# Patient Record
Sex: Male | Born: 1935 | Race: White | Hispanic: No | Smoking: Former smoker
Health system: Southern US, Community
[De-identification: ages and names within clinical notes are randomized; demographics above are authoritative.]

## PROBLEM LIST (undated history)

## (undated) DIAGNOSIS — R972 Elevated prostate specific antigen [PSA]: Secondary | ICD-10-CM

## (undated) DIAGNOSIS — J449 Chronic obstructive pulmonary disease, unspecified: Secondary | ICD-10-CM

## (undated) DIAGNOSIS — I10 Essential (primary) hypertension: Secondary | ICD-10-CM

## (undated) DIAGNOSIS — N400 Enlarged prostate without lower urinary tract symptoms: Secondary | ICD-10-CM

## (undated) HISTORY — DX: Benign prostatic hyperplasia without lower urinary tract symptoms: N40.0

## (undated) HISTORY — DX: Elevated prostate specific antigen (PSA): R97.20

## (undated) HISTORY — DX: Chronic obstructive pulmonary disease, unspecified: J44.9

## (undated) HISTORY — DX: Essential (primary) hypertension: I10

---

## 2005-11-17 ENCOUNTER — Ambulatory Visit: Payer: Self-pay | Admitting: Infectious Diseases

## 2007-04-24 ENCOUNTER — Ambulatory Visit: Payer: Self-pay | Admitting: Gastroenterology

## 2008-02-01 ENCOUNTER — Ambulatory Visit: Payer: Self-pay | Admitting: Internal Medicine

## 2008-02-01 ENCOUNTER — Other Ambulatory Visit: Payer: Self-pay

## 2008-02-06 ENCOUNTER — Ambulatory Visit: Payer: Self-pay | Admitting: Internal Medicine

## 2008-04-22 ENCOUNTER — Ambulatory Visit: Payer: Self-pay | Admitting: Specialist

## 2008-06-04 ENCOUNTER — Ambulatory Visit: Payer: Self-pay | Admitting: Internal Medicine

## 2008-07-10 ENCOUNTER — Ambulatory Visit: Payer: Self-pay | Admitting: Cardiology

## 2011-10-05 ENCOUNTER — Other Ambulatory Visit: Payer: Self-pay | Admitting: Internal Medicine

## 2011-11-12 ENCOUNTER — Ambulatory Visit (INDEPENDENT_AMBULATORY_CARE_PROVIDER_SITE_OTHER): Payer: Medicare Other | Admitting: Internal Medicine

## 2011-11-12 ENCOUNTER — Encounter: Payer: Self-pay | Admitting: Internal Medicine

## 2011-11-12 VITALS — BP 112/72 | HR 70 | Temp 98.7°F | Ht 71.0 in | Wt 178.0 lb

## 2011-11-12 DIAGNOSIS — M810 Age-related osteoporosis without current pathological fracture: Secondary | ICD-10-CM

## 2011-11-12 DIAGNOSIS — R972 Elevated prostate specific antigen [PSA]: Secondary | ICD-10-CM

## 2011-11-12 DIAGNOSIS — J439 Emphysema, unspecified: Secondary | ICD-10-CM

## 2011-11-12 DIAGNOSIS — R5383 Other fatigue: Secondary | ICD-10-CM

## 2011-11-12 DIAGNOSIS — Z86010 Personal history of colonic polyps: Secondary | ICD-10-CM

## 2011-11-12 DIAGNOSIS — R5381 Other malaise: Secondary | ICD-10-CM

## 2011-11-12 DIAGNOSIS — Z125 Encounter for screening for malignant neoplasm of prostate: Secondary | ICD-10-CM

## 2011-11-12 DIAGNOSIS — Z0000 Encounter for general adult medical examination without abnormal findings: Secondary | ICD-10-CM

## 2011-11-12 LAB — CBC WITH DIFFERENTIAL/PLATELET
Basophils Absolute: 0 10*3/uL (ref 0.0–0.1)
Eosinophils Absolute: 0.2 10*3/uL (ref 0.0–0.7)
HCT: 41.6 % (ref 39.0–52.0)
Lymphs Abs: 1.4 10*3/uL (ref 0.7–4.0)
MCV: 97.2 fl (ref 78.0–100.0)
Monocytes Absolute: 0.6 10*3/uL (ref 0.1–1.0)
Neutrophils Relative %: 61.6 % (ref 43.0–77.0)
Platelets: 172 10*3/uL (ref 150.0–400.0)
RDW: 14 % (ref 11.5–14.6)
WBC: 6 10*3/uL (ref 4.5–10.5)

## 2011-11-12 LAB — PSA: PSA: 17.13 ng/mL — ABNORMAL HIGH (ref 0.10–4.00)

## 2011-11-12 NOTE — Progress Notes (Signed)
Patient ID: Eric Hicks, male   DOB: 11-11-1926, 78 y.o.   MRN: [id number] The patient is here for annual Medicare wellness examination and management of other chronic and acute problems. He had a recentt episode of sinusitis treated in March by Urgent Care  With steroid shot and a Z pack .  Sympotms resolved.  No new issues,  Worried abou this wife Eric Hicks due to recent changes in memory.      The risk factors are reflected in the social history.  The roster of all physicians providing medical care to patient - is listed in the Snapshot section of the chart.  Activities of daily living:  The patient is 100% independent in all ADLs: dressing, toileting, feeding as well as independent mobility  Home safety : The patient has smoke detectors in the home. They wear seatbelts.  There are no firearms at home. There is no violence in the home.   There is no risks for hepatitis, STDs or HIV. There is no   history of blood transfusion. They have no travel history to infectious disease endemic areas of the world.  The patient has seen their dentist in the last six month. They have seen their eye doctor in the last year. They admit to slight hearing difficulty with regard to whispered voices and some television programs.  They have deferred audiologic testing in the last year.  They do not  have excessive sun exposure. Discussed the need for sun protection: hats, long sleeves and use of sunscreen if there is significant sun exposure.   Diet: the importance of a healthy diet is discussed. They do have a healthy diet.  The benefits of regular aerobic exercise were discussed. She walks 4 times per week ,  20 minutes.   Depression screen: there are no signs or vegative symptoms of depression- irritability, change in appetite, anhedonia, sadness/tearfullness.  Cognitive assessment: the patient manages all their financial and personal affairs and is actively engaged. They could relate day,date,year and events; recalled  2/3 objects at 3 minutes; performed clock-face test normally.  The following portions of the patient's history were reviewed and updated as appropriate: allergies, current medications, past family history, past medical history,  past surgical history, past social history  and problem list.  Visual acuity was not assessed per patient preference since she has regular follow up with her ophthalmologist. Hearing and body mass index were assessed and reviewed.   During the course of the visit the patient was educated and counseled about appropriate screening and preventive services including : fall prevention , diabetes screening, nutrition counseling, colorectal cancer screening, and recommended immunizations.    General appearance: alert, cooperative and appears stated age Head: Normocephalic, without obvious abnormality, atraumatic Eyes: conjunctivae/corneas clear. PERRL, EOM's intact. Fundi benign. Ears: normal TM's and external ear canals both ears Nose: Nares normal. Septum midline. Mucosa normal. No drainage or sinus tenderness. Throat: lips, mucosa, and tongue normal; teeth and gums normal Neck: no adenopathy, no carotid bruit, no JVD, supple, symmetrical, trachea midline and thyroid not enlarged, symmetric, no tenderness/mass/nodules Lungs: clear to auscultation bilaterally Heart: regular rate and rhythm, S1, S2 normal, no murmur, click, rub or gallop Abdomen: soft, non-tender; bowel sounds normal; no masses,  no organomegaly Extremities: extremities normal, atraumatic, no cyanosis or edema Pulses: 2+ and symmetric Skin: Skin color, texture, turgor normal. No rashes or lesions Neurologic: Alert and oriented X 3, normal strength and tone. Normal symmetric reflexes. Normal coordination and gait.   Assessment and Plan  History of adenomatous polyp of colon By serial colonoscopy in 2004, 2008 and 2012. Repeat ue in 2015, iftikhar.   COPD (chronic obstructive pulmonary disease) with  emphysema By PFTs in 2009, secondary to tobacco abuse.  currenlly asymptomatic on maintenance inhalers  Elevated PSA, between 10 and less than 20 ng/ml He continues to receive annual PSAs despite his age due BPH and management by Dr. Garner Nash.  Will send today's elevated PSA to him,  Previous level unknown as patient ha snot been seen in 1 year and records were not available at time of evaluation    Updated Medication List Outpatient Encounter Prescriptions as of 11/12/2011  Medication Sig Dispense Refill  . ADVAIR DISKUS 250-50 MCG/DOSE AEPB INHALE 1 PUFF TWICE A DAY  60 each  5  . alendronate (FOSAMAX) 70 MG tablet Take 70 mg by mouth every 7 (seven) days. Take with a full glass of water on an empty stomach.      . AMBULATORY NON FORMULARY MEDICATION Prostate Formula  With Saw Palmetto 2 tablets daily      . AMBULATORY NON FORMULARY MEDICATION Joint Advantage Gold 2 tablets daily      . calcium carbonate (OS-CAL) 600 MG TABS Take 600 mg by mouth 2 (two) times daily.      Marland Kitchen doxazosin (CARDURA) 4 MG tablet TAKE 1 TABLET BY MOUTH AT BEDTIME  90 tablet  3  . Ergocalciferol (VITAMIN D2) 400 UNITS TABS Take 1 tablet by mouth daily.      . hydrochlorothiazide (HYDRODIURIL) 25 MG tablet TAKE 1 TABLET BY MOUTH EVERY MORNING  90 tablet  3  . meclizine (ANTIVERT) 25 MG tablet Take 25 mg by mouth as needed.

## 2011-11-12 NOTE — Patient Instructions (Addendum)
Try benadryl,  Allegra, zyrtec or claritin to see if they will dry up your inner ear.  (once daily)  Also consider using Simply Saline to flush sinuses out once or twice daily

## 2011-11-13 LAB — COMPLETE METABOLIC PANEL WITH GFR
ALT: 14 U/L (ref 0–53)
AST: 21 U/L (ref 0–37)
Albumin: 3.9 g/dL (ref 3.5–5.2)
BUN: 24 mg/dL — ABNORMAL HIGH (ref 6–23)
CO2: 29 mEq/L (ref 19–32)
Calcium: 9.4 mg/dL (ref 8.4–10.5)
Chloride: 107 mEq/L (ref 96–112)
Creat: 1.77 mg/dL — ABNORMAL HIGH (ref 0.50–1.35)
GFR, Est African American: 40 mL/min — ABNORMAL LOW
Potassium: 4.4 mEq/L (ref 3.5–5.3)

## 2011-11-14 ENCOUNTER — Encounter: Payer: Self-pay | Admitting: Internal Medicine

## 2011-11-14 DIAGNOSIS — Z860101 Personal history of adenomatous and serrated colon polyps: Secondary | ICD-10-CM | POA: Insufficient documentation

## 2011-11-14 DIAGNOSIS — Z86010 Personal history of colonic polyps: Secondary | ICD-10-CM | POA: Insufficient documentation

## 2011-11-14 DIAGNOSIS — J439 Emphysema, unspecified: Secondary | ICD-10-CM | POA: Insufficient documentation

## 2011-11-14 DIAGNOSIS — R972 Elevated prostate specific antigen [PSA]: Secondary | ICD-10-CM | POA: Insufficient documentation

## 2011-11-14 DIAGNOSIS — N400 Enlarged prostate without lower urinary tract symptoms: Secondary | ICD-10-CM | POA: Insufficient documentation

## 2011-11-14 NOTE — Assessment & Plan Note (Signed)
By serial colonoscopy in 2004, 2008 and 2012. Repeat ue in 2015, iftikhar.

## 2011-11-14 NOTE — Assessment & Plan Note (Signed)
By PFTs in 2009, secondary to tobacco abuse.  currenlly asymptomatic on maintenance inhalers

## 2011-11-14 NOTE — Assessment & Plan Note (Signed)
He continues to receive annual PSAs despite his age due BPH and management by Dr. Garner Nash.  Will send today's elevated PSA to him,  Previous level unknown as patient ha snot been seen in 1 year and records were not available at time of evaluation

## 2011-11-22 ENCOUNTER — Encounter: Payer: Self-pay | Admitting: Internal Medicine

## 2011-12-10 ENCOUNTER — Other Ambulatory Visit (INDEPENDENT_AMBULATORY_CARE_PROVIDER_SITE_OTHER): Payer: Medicare Other | Admitting: *Deleted

## 2011-12-10 DIAGNOSIS — I10 Essential (primary) hypertension: Secondary | ICD-10-CM

## 2011-12-10 LAB — BASIC METABOLIC PANEL
Calcium: 9.5 mg/dL (ref 8.4–10.5)
Creatinine, Ser: 1.4 mg/dL (ref 0.4–1.5)
GFR: 50.16 mL/min — ABNORMAL LOW (ref >60.00)
Sodium: 142 mEq/L (ref 135–145)

## 2011-12-22 ENCOUNTER — Ambulatory Visit: Payer: Self-pay | Admitting: Internal Medicine

## 2011-12-22 LAB — HM DEXA SCAN: HM Dexa Scan: -2.7

## 2012-01-03 ENCOUNTER — Encounter: Payer: Self-pay | Admitting: Internal Medicine

## 2012-01-03 ENCOUNTER — Telehealth: Payer: Self-pay | Admitting: Internal Medicine

## 2012-01-03 DIAGNOSIS — M810 Age-related osteoporosis without current pathological fracture: Secondary | ICD-10-CM | POA: Insufficient documentation

## 2012-01-03 NOTE — Telephone Encounter (Signed)
His osteoporosois has improved since 2009 with alendronate.  continue medication for now.

## 2012-01-03 NOTE — Assessment & Plan Note (Signed)
Managed with alendronate and improvement seen by 2013 DEXA scan. With DEXA Aug 2009 T scores -2.7  Both hips imporving to -2.0.

## 2012-01-04 NOTE — Telephone Encounter (Signed)
Left detailed message notifying patient.

## 2012-02-24 ENCOUNTER — Other Ambulatory Visit: Payer: Self-pay | Admitting: Internal Medicine

## 2012-02-24 DIAGNOSIS — R972 Elevated prostate specific antigen [PSA]: Secondary | ICD-10-CM

## 2012-04-05 ENCOUNTER — Other Ambulatory Visit: Payer: Self-pay | Admitting: Internal Medicine

## 2012-04-05 ENCOUNTER — Other Ambulatory Visit: Payer: Self-pay

## 2012-04-05 MED ORDER — FLUTICASONE-SALMETEROL 250-50 MCG/DOSE IN AEPB: 1.0000 | INHALATION_SPRAY | Freq: Two times a day (BID) | RESPIRATORY_TRACT | Status: DC

## 2012-04-05 NOTE — Telephone Encounter (Signed)
Refill request for Advair 250-50 mc sent electronic to [facility].

## 2012-06-10 ENCOUNTER — Other Ambulatory Visit: Payer: Self-pay

## 2012-10-02 ENCOUNTER — Other Ambulatory Visit: Payer: Self-pay | Admitting: *Deleted

## 2012-10-02 MED ORDER — FLUTICASONE-SALMETEROL 250-50 MCG/DOSE IN AEPB: 1.0000 | INHALATION_SPRAY | Freq: Two times a day (BID) | RESPIRATORY_TRACT | Status: DC

## 2012-10-05 ENCOUNTER — Other Ambulatory Visit: Payer: Self-pay | Admitting: Internal Medicine

## 2012-10-05 DIAGNOSIS — Z79899 Other long term (current) drug therapy: Secondary | ICD-10-CM

## 2012-10-05 DIAGNOSIS — E785 Hyperlipidemia, unspecified: Secondary | ICD-10-CM

## 2012-10-05 NOTE — Telephone Encounter (Signed)
Patient has not been seen since July and should be seen every 6 months for management of hypertension, etc.  His labs are overdue.  Ok to refill for 30 days but he needs to come by for fasting labs during that period and to schedule an annual exam. 30 minutes medicare wellness

## 2012-10-13 NOTE — Telephone Encounter (Signed)
Left message for patient to return call.

## 2012-10-16 NOTE — Telephone Encounter (Signed)
Patient scheduled for appointment 10/11/12 will come fasting for labs also that day. FYI

## 2012-11-10 ENCOUNTER — Ambulatory Visit (INDEPENDENT_AMBULATORY_CARE_PROVIDER_SITE_OTHER): Payer: Medicare Other | Admitting: Internal Medicine

## 2012-11-10 ENCOUNTER — Encounter: Payer: Self-pay | Admitting: Internal Medicine

## 2012-11-10 VITALS — BP 146/78 | HR 73 | Temp 97.7°F | Resp 14 | Ht 70.0 in | Wt 177.8 lb

## 2012-11-10 DIAGNOSIS — Z1211 Encounter for screening for malignant neoplasm of colon: Secondary | ICD-10-CM

## 2012-11-10 DIAGNOSIS — N400 Enlarged prostate without lower urinary tract symptoms: Secondary | ICD-10-CM

## 2012-11-10 DIAGNOSIS — R972 Elevated prostate specific antigen [PSA]: Secondary | ICD-10-CM

## 2012-11-10 DIAGNOSIS — J438 Other emphysema: Secondary | ICD-10-CM

## 2012-11-10 DIAGNOSIS — Z79899 Other long term (current) drug therapy: Secondary | ICD-10-CM

## 2012-11-10 DIAGNOSIS — L270 Generalized skin eruption due to drugs and medicaments taken internally: Secondary | ICD-10-CM | POA: Insufficient documentation

## 2012-11-10 DIAGNOSIS — I10 Essential (primary) hypertension: Secondary | ICD-10-CM

## 2012-11-10 DIAGNOSIS — E785 Hyperlipidemia, unspecified: Secondary | ICD-10-CM

## 2012-11-10 DIAGNOSIS — M810 Age-related osteoporosis without current pathological fracture: Secondary | ICD-10-CM

## 2012-11-10 DIAGNOSIS — Z860101 Personal history of adenomatous and serrated colon polyps: Secondary | ICD-10-CM

## 2012-11-10 DIAGNOSIS — N182 Chronic kidney disease, stage 2 (mild): Secondary | ICD-10-CM

## 2012-11-10 DIAGNOSIS — Z0000 Encounter for general adult medical examination without abnormal findings: Secondary | ICD-10-CM

## 2012-11-10 DIAGNOSIS — J439 Emphysema, unspecified: Secondary | ICD-10-CM

## 2012-11-10 DIAGNOSIS — Z86010 Personal history of colonic polyps: Secondary | ICD-10-CM

## 2012-11-10 LAB — COMPREHENSIVE METABOLIC PANEL
ALT: 14 U/L (ref 0–53)
BUN: 28 mg/dL — ABNORMAL HIGH (ref 6–23)
CO2: 29 mEq/L (ref 19–32)
Calcium: 9.5 mg/dL (ref 8.4–10.5)
Chloride: 102 mEq/L (ref 96–112)
Creatinine, Ser: 1.7 mg/dL — ABNORMAL HIGH (ref 0.4–1.5)
GFR: 42.43 mL/min — ABNORMAL LOW (ref >60.00)
Glucose, Bld: 89 mg/dL (ref 70–99)

## 2012-11-10 LAB — LIPID PANEL
Cholesterol: 94 mg/dL (ref 0–200)
HDL: 49.5 mg/dL (ref >39.00)

## 2012-11-10 LAB — PSA, MEDICARE: PSA: 17.6 ng/ml — ABNORMAL HIGH (ref 0.10–4.00)

## 2012-11-10 NOTE — Progress Notes (Signed)
Patient ID: Eric Hicks, male   DOB: 08-Dec-1926, 79 y.o.   MRN: [id number]  The patient is here for annual Medicare wellness examination and management of other chronic and acute problems. Has a small laceration that is bleeding,    The risk factors are reflected in the social history.  Does not drink alcohol.,  Drinks sweet tea once daily . Not sexually active but wants to try Viagra.  celebrated 65 years of marriage yesterday.   The roster of all physicians providing medical care to patient - is listed in the Snapshot section of the chart.  Activities of daily living:  The patient is 100% independent in all ADLs: dressing, toileting, feeding as well as independent mobility  Home safety : The patient has smoke detectors in the home. They wear seatbelts.  There are no firearms at home. There is no violence in the home.   There is no risks for hepatitis, STDs or HIV. There is no   history of blood transfusion. They have no travel history to infectious disease endemic areas of the world.  The patient has seen their dentist in the last six month. They have seen their eye doctor in the last year. They admit to slight hearing difficulty with regard to whispered voices and some television programs.  They have deferred audiologic testing in the last year.  They do not  have excessive sun exposure. Discussed the need for sun protection: hats, long sleeves and use of sunscreen if there is significant sun exposure.   Diet: the importance of a healthy diet is discussed. They do have a healthy diet.  The benefits of regular aerobic exercise were discussed. She walks 4 times per week ,  20 minutes.   Depression screen: there are no signs or vegative symptoms of depression- irritability, change in appetite, anhedonia, sadness/tearfullness.  Cognitive assessment: the patient manages all their financial and personal affairs and is actively engaged. They could relate day,date,year and events; recalled 2/3 objects  at 3 minutes; performed clock-face test normally.  The following portions of the patient's history were reviewed and updated as appropriate: allergies, current medications, past family history, past medical history,  past surgical history, past social history  and problem list.  Visual acuity was not assessed per patient preference since she has regular follow up with her ophthalmologist. Hearing and body mass index were assessed and reviewed.   During the course of the visit the patient was educated and counseled about appropriate screening and preventive services including : fall prevention , diabetes screening, nutrition counseling, colorectal cancer screening, and recommended immunizations.    Objective:   BP 146/78  Pulse 73  Temp(Src) 97.7 F (36.5 C) (Oral)  Resp 14  Ht 5\' 10"  (1.778 m)  Wt 177 lb 12 oz (80.627 kg)  BMI 25.5 kg/m2  SpO2 98%  BP 146/78  Pulse 73  Temp(Src) 97.7 F (36.5 C) (Oral)  Resp 14  Ht 5\' 10"  (1.778 m)  Wt 177 lb 12 oz (80.627 kg)  BMI 25.5 kg/m2  SpO2 98%  General Appearance:    Alert, cooperative, no distress, appears stated age  Head:    Normocephalic, without obvious abnormality, atraumatic  Eyes:    PERRL, conjunctiva/corneas clear, EOM's intact, fundi    benign, both eyes       Ears:    Normal TM's and external ear canals, both ears  Nose:   Nares normal, septum midline, mucosa normal, no drainage   or sinus tenderness  Throat:  Lips, mucosa, and tongue normal; teeth and gums normal  Neck:   Supple, symmetrical, trachea midline, no adenopathy;       thyroid:  No enlargement/tenderness/nodules; no carotid   bruit or JVD  Back:     Symmetric, no curvature, ROM normal, no CVA tenderness  Lungs:     Clear to auscultation bilaterally, respirations unlabored  Chest wall:    No tenderness or deformity  Heart:    Regular rate and rhythm, S1 and S2 normal, no murmur, rub   or gallop  Abdomen:     Soft, non-tender, bowel sounds active all four  quadrants,    no masses, no organomegaly  Genitalia:    Normal male without lesion, discharge or tenderness  Rectal:    Normal tone, enlarged  prostate, no masses or tenderness;   guaiac negative stool  Extremities:   Extremities normal, atraumatic, no cyanosis or edema  Pulses:   2+ and symmetric all extremities  Skin:   Skin color, texture, turgor normal, no rashes or lesions  Lymph nodes:   Cervical, supraclavicular, and axillary nodes normal  Neurologic:   CNII-XII intact. Normal strength, sensation and reflexes      throughout   Assessment and Plan:  Osteoporosis Has been taking alendronate since 2009. t scores have improved, testsoterone replacement c/i secondary to elevated PSA.  Elevated PSA, between 10 and less than 20 ng/ml He has urinary retention symptoms and nocturia x 3. continue current medications and follow up with new urologist.     COPD (chronic obstructive pulmonary disease) with emphysema He remains asymptomatic on current medications. No changes today.  BPH (benign prostatic hyperplasia) Managed with medications. He was having annual surveillance by urology due to elevated PSA and prior benign prostate biopsy. His urologist has retired. I'm referring him to Dr. [facility] for ongoing surveillance.  History of adenomatous polyp of colon He is due for repeat colonoscopy in 2015. Fecal occult blood test today was negative.  Routine general medical examination at a health care facility Annual male exam was done including testicular and prostate exam. His prostate is quite enlarged. PSA remains elevated at 17 which is essentially unchanged from last year I referring him to Dr. Richardson Landry for ongoing surveillance.. Fecal occult blood test was negative. Next colonoscopy is due  June 2015 for history of adenomatous polyp.   CKD (chronic kidney disease), stage II His creatinine has been stable at 1.4 but was elevated today at 1.7. He does take  hydrochlorothiazide and was fasting at the time the labs were done. I will have him return for repeat basic metabolic panel in a well-hydrated state and reassess.  Hypertension Managed with hydrochlorothiazide, and an episode of diplopia with left lateral gaze that was evaluated by Dr. Fransico Michael in June 2013 in attributed to hypertension. His blood pressure is elevated today and his creatinine was also elevated. We will consider alternative therapy following repeat evaluation of kidney function.   Updated Medication List Outpatient Encounter Prescriptions as of 11/10/2012  Medication Sig Dispense Refill  . alendronate (FOSAMAX) 70 MG tablet Take 70 mg by mouth every 7 (seven) days. Take with a full glass of water on an empty stomach.      . AMBULATORY NON FORMULARY MEDICATION Prostate Formula  With Saw Palmetto 2 tablets daily      . AMBULATORY NON FORMULARY MEDICATION Joint Advantage Gold 2 tablets daily      . calcium carbonate (OS-CAL) 600 MG TABS Take 600 mg by mouth  2 (two) times daily.      Marland Kitchen doxazosin (CARDURA) 4 MG tablet TAKE 1 TABLET BY MOUTH AT BEDTIME  30 tablet  0  . Ergocalciferol (VITAMIN D2) 400 UNITS TABS Take 1 tablet by mouth daily.      . Fluticasone-Salmeterol (ADVAIR DISKUS) 250-50 MCG/DOSE AEPB Inhale 1 puff into the lungs 2 (two) times daily.  60 each  1  . hydrochlorothiazide (HYDRODIURIL) 25 MG tablet TAKE 1 TABLET BY MOUTH EVERY MORNING  30 tablet  0  . meclizine (ANTIVERT) 25 MG tablet Take 25 mg by mouth as needed.       No facility-administered encounter medications on file as of 11/10/2012.

## 2012-11-10 NOTE — Assessment & Plan Note (Signed)
Has been taking alendronate since 2009. t scores have improved, testsoterone replacement c/i secondary to elevated PSA.

## 2012-11-10 NOTE — Patient Instructions (Signed)
You had your annual Medicare wellness exam today  We will schedule your urology appt with dr Richardson Landry   Your fecal occult blood test was normal.  Plan to have your next colonoscopy next year.   We will contact you with the bloodwork results  We will refill your medications once I review your labs from today

## 2012-11-10 NOTE — Assessment & Plan Note (Addendum)
He has urinary retention symptoms and nocturia x 3. continue current medications and follow up with new urologist.

## 2012-11-12 ENCOUNTER — Encounter: Payer: Self-pay | Admitting: Internal Medicine

## 2012-11-12 DIAGNOSIS — N182 Chronic kidney disease, stage 2 (mild): Secondary | ICD-10-CM | POA: Insufficient documentation

## 2012-11-12 DIAGNOSIS — I10 Essential (primary) hypertension: Secondary | ICD-10-CM | POA: Insufficient documentation

## 2012-11-12 NOTE — Assessment & Plan Note (Signed)
He is due for repeat colonoscopy in 2015. Fecal occult blood test today was negative.

## 2012-11-12 NOTE — Assessment & Plan Note (Signed)
Annual male exam was done including testicular and prostate exam. His prostate is quite enlarged. PSA remains elevated at 17 which is essentially unchanged from last year I referring him to Dr. Richardson Landry for ongoing surveillance.. Fecal occult blood test was negative. Next colonoscopy is due  June 2015 for history of adenomatous polyp.

## 2012-11-12 NOTE — Assessment & Plan Note (Signed)
He remains asymptomatic on current medications. No changes today.   

## 2012-11-12 NOTE — Assessment & Plan Note (Signed)
Managed with hydrochlorothiazide, and an episode of diplopia with left lateral gaze that was evaluated by Dr. Fransico Michael in June 2013 in attributed to hypertension. His blood pressure is elevated today and his creatinine was also elevated. We will consider alternative therapy following repeat evaluation of kidney function.

## 2012-11-12 NOTE — Assessment & Plan Note (Signed)
Managed with medications. He was having annual surveillance by urology due to elevated PSA and prior benign prostate biopsy. His urologist has retired. I'm referring him to Dr. Al[facility] for ongoing surveillance.

## 2012-11-12 NOTE — Assessment & Plan Note (Signed)
His creatinine has been stable at 1.4 but was elevated today at 1.7. He does take hydrochlorothiazide and was fasting at the time the labs were done. I will have him return for repeat basic metabolic panel in a well-hydrated state and reassess.

## 2012-11-15 ENCOUNTER — Other Ambulatory Visit (INDEPENDENT_AMBULATORY_CARE_PROVIDER_SITE_OTHER): Payer: Medicare Other

## 2012-11-15 DIAGNOSIS — N182 Chronic kidney disease, stage 2 (mild): Secondary | ICD-10-CM

## 2012-11-15 LAB — BASIC METABOLIC PANEL
BUN: 28 mg/dL — ABNORMAL HIGH (ref 6–23)
Calcium: 9.5 mg/dL (ref 8.4–10.5)
Chloride: 106 mEq/L (ref 96–112)
Creatinine, Ser: 1.9 mg/dL — ABNORMAL HIGH (ref 0.4–1.5)

## 2012-11-16 ENCOUNTER — Encounter: Payer: Self-pay | Admitting: Emergency Medicine

## 2012-11-16 ENCOUNTER — Encounter: Payer: Self-pay | Admitting: Internal Medicine

## 2012-11-16 NOTE — Assessment & Plan Note (Signed)
No improvement in fact his kidney function has worsened. Return to nephrology.

## 2012-11-16 NOTE — Addendum Note (Signed)
Addended by: Sherlene Shams on: 11/16/2012 02:48 PM   Modules accepted: Orders

## 2012-11-17 ENCOUNTER — Encounter: Payer: Self-pay | Admitting: Internal Medicine

## 2012-11-20 NOTE — Telephone Encounter (Signed)
Amber, see [employer].

## 2012-11-21 ENCOUNTER — Other Ambulatory Visit: Payer: Self-pay | Admitting: *Deleted

## 2012-11-21 ENCOUNTER — Other Ambulatory Visit: Payer: Self-pay | Admitting: Internal Medicine

## 2012-11-21 MED ORDER — FLUTICASONE-SALMETEROL 250-50 MCG/DOSE IN AEPB: 1.0000 | INHALATION_SPRAY | Freq: Two times a day (BID) | RESPIRATORY_TRACT | Status: DC

## 2013-03-01 ENCOUNTER — Other Ambulatory Visit: Payer: Self-pay

## 2013-05-13 ENCOUNTER — Other Ambulatory Visit: Payer: Self-pay | Admitting: Internal Medicine

## 2013-06-15 ENCOUNTER — Other Ambulatory Visit: Payer: Self-pay | Admitting: Internal Medicine

## 2013-06-25 ENCOUNTER — Other Ambulatory Visit: Payer: Self-pay | Admitting: *Deleted

## 2013-06-25 DIAGNOSIS — N183 Chronic kidney disease, stage 3 unspecified: Secondary | ICD-10-CM

## 2013-06-26 ENCOUNTER — Telehealth: Payer: Self-pay | Admitting: *Deleted

## 2013-06-26 ENCOUNTER — Other Ambulatory Visit (INDEPENDENT_AMBULATORY_CARE_PROVIDER_SITE_OTHER): Payer: Medicare Other

## 2013-06-26 DIAGNOSIS — Z79899 Other long term (current) drug therapy: Secondary | ICD-10-CM

## 2013-06-26 NOTE — Telephone Encounter (Signed)
What labs and dx?  

## 2013-06-27 LAB — COMPREHENSIVE METABOLIC PANEL
ALK PHOS: 56 U/L (ref 39–117)
ALT: 14 U/L (ref 0–53)
AST: 19 U/L (ref 0–37)
Albumin: 3.6 g/dL (ref 3.5–5.2)
BILIRUBIN TOTAL: 1 mg/dL (ref 0.3–1.2)
BUN: 29 mg/dL — ABNORMAL HIGH (ref 6–23)
CO2: 29 mEq/L (ref 19–32)
Calcium: 9.5 mg/dL (ref 8.4–10.5)
Chloride: 105 mEq/L (ref 96–112)
Creatinine, Ser: 1.7 mg/dL — ABNORMAL HIGH (ref 0.4–1.5)
GFR: 40.11 mL/min — ABNORMAL LOW (ref >60.00)
GLUCOSE: 88 mg/dL (ref 70–99)
POTASSIUM: 4.4 meq/L (ref 3.5–5.1)
SODIUM: 141 meq/L (ref 135–145)
TOTAL PROTEIN: 6 g/dL (ref 6.0–8.3)

## 2013-06-28 ENCOUNTER — Encounter: Payer: Self-pay | Admitting: Internal Medicine

## 2013-07-02 NOTE — Telephone Encounter (Signed)
Unread mychart message mailed to patient 

## 2013-10-17 ENCOUNTER — Other Ambulatory Visit: Payer: Self-pay | Admitting: Internal Medicine

## 2013-11-07 ENCOUNTER — Other Ambulatory Visit: Payer: Self-pay | Admitting: Internal Medicine

## 2013-11-20 ENCOUNTER — Ambulatory Visit (INDEPENDENT_AMBULATORY_CARE_PROVIDER_SITE_OTHER): Payer: Medicare Other | Admitting: Internal Medicine

## 2013-11-20 ENCOUNTER — Encounter: Payer: Self-pay | Admitting: Internal Medicine

## 2013-11-20 VITALS — BP 124/70 | HR 79 | Temp 97.9°F | Resp 16 | Ht 70.0 in | Wt 178.5 lb

## 2013-11-20 DIAGNOSIS — Z860100 Personal history of colon polyps, unspecified: Secondary | ICD-10-CM

## 2013-11-20 DIAGNOSIS — R972 Elevated prostate specific antigen [PSA]: Secondary | ICD-10-CM

## 2013-11-20 DIAGNOSIS — I150 Renovascular hypertension: Secondary | ICD-10-CM

## 2013-11-20 DIAGNOSIS — M810 Age-related osteoporosis without current pathological fracture: Secondary | ICD-10-CM

## 2013-11-20 DIAGNOSIS — S90569S Insect bite (nonvenomous), unspecified ankle, sequela: Secondary | ICD-10-CM

## 2013-11-20 DIAGNOSIS — Z0000 Encounter for general adult medical examination without abnormal findings: Secondary | ICD-10-CM

## 2013-11-20 DIAGNOSIS — N182 Chronic kidney disease, stage 2 (mild): Secondary | ICD-10-CM

## 2013-11-20 DIAGNOSIS — S90569A Insect bite (nonvenomous), unspecified ankle, initial encounter: Secondary | ICD-10-CM | POA: Insufficient documentation

## 2013-11-20 DIAGNOSIS — X58XXXS Exposure to other specified factors, sequela: Secondary | ICD-10-CM

## 2013-11-20 DIAGNOSIS — IMO0002 Reserved for concepts with insufficient information to code with codable children: Secondary | ICD-10-CM

## 2013-11-20 DIAGNOSIS — T148XXS Other injury of unspecified body region, sequela: Secondary | ICD-10-CM

## 2013-11-20 DIAGNOSIS — R5381 Other malaise: Secondary | ICD-10-CM

## 2013-11-20 DIAGNOSIS — W57XXXS Bitten or stung by nonvenomous insect and other nonvenomous arthropods, sequela: Secondary | ICD-10-CM

## 2013-11-20 DIAGNOSIS — R5383 Other fatigue: Secondary | ICD-10-CM

## 2013-11-20 DIAGNOSIS — W57XXXA Bitten or stung by nonvenomous insect and other nonvenomous arthropods, initial encounter: Secondary | ICD-10-CM

## 2013-11-20 DIAGNOSIS — Z23 Encounter for immunization: Secondary | ICD-10-CM

## 2013-11-20 MED ORDER — TRIAMCINOLONE ACETONIDE 0.1 % EX CREA: 1.0000 "application " | TOPICAL_CREAM | Freq: Two times a day (BID) | CUTANEOUS | Status: DC

## 2013-11-20 MED ORDER — ZOSTER VACCINE LIVE 19400 UNT/0.65ML ~~LOC~~ SOLR: 0.6500 mL | Freq: Once | SUBCUTANEOUS | Status: DC

## 2013-11-20 MED ORDER — DOXAZOSIN MESYLATE 4 MG PO TABS: ORAL_TABLET | ORAL | Status: DC

## 2013-11-20 MED ORDER — HYDROCHLOROTHIAZIDE 25 MG PO TABS: ORAL_TABLET | ORAL | Status: DC

## 2013-11-20 NOTE — Progress Notes (Signed)
Pre-visit discussion using our clinic review tool. No additional management support is needed unless otherwise documented below in the visit note.  

## 2013-11-20 NOTE — Progress Notes (Signed)
Patient ID: Eric Hicks, male   DOB: October 05, 1926, 80 y.o.   MRN: [id number]  The patient is here for annual Medicare wellness examination and management of other chronic and acute problems.   The risk factors are reflected in the social history.  The roster of all physicians providing medical care to patient - is listed in the Snapshot section of the chart.  Activities of daily living:  The patient is 100% independent in all ADLs: dressing, toileting, feeding as well as independent mobility  Home safety : The patient has smoke detectors in the home. They wear seatbelts.  There are no firearms at home. There is no violence in the home.   There is no risks for hepatitis, STDs or HIV. There is no   history of blood transfusion. They have no travel history to infectious disease endemic areas of the world.  The patient has seen their dentist in the last six month. They have seen their eye doctor in the last year. They admit to slight hearing difficulty with regard to whispered voices and some television programs.  They have deferred audiologic testing in the last year.  They do not  have excessive sun exposure. Discussed the need for sun protection: hats, long sleeves and use of sunscreen if there is significant sun exposure.   Diet: the importance of a healthy diet is discussed. They do have a healthy diet.  The benefits of regular aerobic exercise were discussed. She walks 4 times per week ,  20 minutes.   Depression screen: there are no signs or vegative symptoms of depression- irritability, change in appetite, anhedonia, sadness/tearfullness.  Cognitive assessment: the patient manages all their financial and personal affairs and is actively engaged. They could relate day,date,year and events; recalled 2/3 objects at 3 minutes; performed clock-face test normally.  The following portions of the patient's history were reviewed and updated as appropriate: allergies, current medications, past family  history, past medical history,  past surgical history, past social history  and problem list.  Visual acuity was not assessed per patient preference since she has regular follow up with her ophthalmologist. Hearing and body mass index were assessed and reviewed.   During the course of the visit the patient was educated and counseled about appropriate screening and preventive services including : fall prevention , diabetes screening, nutrition counseling, colorectal cancer screening, and recommended immunizations.    Objective:  BP 124/70  Pulse 79  Temp(Src) 97.9 F (36.6 C) (Oral)  Resp 16  Ht 5\' 10"  (1.778 m)  Wt 178 lb 8 oz (80.967 kg)  BMI 25.61 kg/m2  SpO2 95%  General Appearance:    Alert, cooperative, no distress, appears stated age  Head:    Normocephalic, without obvious abnormality, atraumatic  Eyes:    PERRL, conjunctiva/corneas clear, EOM's intact, fundi    benign, both eyes       Ears:    Normal TM's and external ear canals, both ears  Nose:   Nares normal, septum midline, mucosa normal, no drainage   or sinus tenderness  Throat:   Lips, mucosa, and tongue normal; teeth and gums normal  Neck:   Supple, symmetrical, trachea midline, no adenopathy;       thyroid:  No enlargement/tenderness/nodules; no carotid   bruit or JVD  Back:     Symmetric, no curvature, ROM normal, no CVA tenderness  Lungs:     Clear to auscultation bilaterally, respirations unlabored  Chest wall:    No tenderness or  deformity  Heart:    Regular rate and rhythm, S1 and S2 normal, no murmur, rub   or gallop  Abdomen:     Soft, non-tender, bowel sounds active all four quadrants,    no masses, no organomegaly  Genitalia:    Deferred to Urology  Rectal:    Deferred    Extremities:   Extremities normal, atraumatic, no cyanosis or edema  Pulses:   2+ and symmetric all extremities  Skin:   Skin color, texture, turgor normal, no rashes or lesions  Lymph nodes:   Cervical, supraclavicular, and  axillary nodes normal  Neurologic:   CNII-XII intact. Normal strength, sensation and reflexes      throughout   Assessment and Plan:  Elevated PSA, between 10 and less than 20 ng/ml Now on Flomax by Dr. Broadus John.  His last PSA was under 10   Osteoporosis Did not take alendronate due to failure to remember  a weekly dose  Taking  60 mg calcium twice daily   Hypertension Managed with hydrochlorothiazide, with stable cr and bi annual nephrology follow up for CKD.    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  stable   Lab Results  Component Value Date   CREATININE 1.7* 06/27/2013   Lab Results  Component Value Date   NA 141 06/27/2013   K 4.4 06/27/2013   CL 105 06/27/2013   CO2 29 06/27/2013     Routine general medical examination at a health care facility Annual male exam was done excluding testicular and prostate exam. PSA is  Being repeated by Urologist Dr. Broadus John   History of adenomatous polyp of colon He is due for 3 yr follow up on adenomatous polyps found by Dr. Dionne Milo, but given his age,  May not be worth the risk . Refer to Dr. Vira Agar for evaluation.   Caregiver with fatigue Long discussion about his need for support and assistance in caring for wife [name] who has vascular dementia.     Updated Medication List Outpatient Encounter Prescriptions as of 11/20/2013  Medication Sig  . ADVAIR DISKUS 250-50 MCG/DOSE AEPB INHALE 1 PUFF INTO THE LUNGS TWICE A DAY  . AMBULATORY NON FORMULARY MEDICATION Prostate Formula  With Saw Palmetto 2 tablets daily  . AMBULATORY NON FORMULARY MEDICATION Joint Advantage Gold 2 tablets daily  . calcium carbonate (OS-CAL) 600 MG TABS Take 600 mg by mouth 2 (two) times daily.  Marland Kitchen doxazosin (CARDURA) 4 MG tablet TAKE 1 TABLET BY MOUTH AT BEDTIME  . Ergocalciferol (VITAMIN D2)  400 UNITS TABS Take 1 tablet by mouth daily.  . hydrochlorothiazide (HYDRODIURIL) 25 MG tablet TAKE 1 TABLET BY MOUTH IN THE MORNING  . meclizine (ANTIVERT) 25 MG tablet Take 25 mg by mouth as needed.  . [DISCONTINUED] doxazosin (CARDURA) 4 MG tablet TAKE 1 TABLET BY MOUTH AT BEDTIME  . [DISCONTINUED] hydrochlorothiazide (HYDRODIURIL) 25 MG tablet TAKE 1 TABLET BY MOUTH IN THE MORNING  . alendronate (FOSAMAX) 70 MG tablet Take 70 mg by mouth every 7 (seven) days. Take with a full glass of water on an empty stomach.  . triamcinolone cream (KENALOG) 0.1 % Apply 1 application topically 2 (two) times daily.  Marland Kitchen zoster vaccine live, PF, (ZOSTAVAX) 38756 UNT/0.65ML injection Inject 19,400 Units into the skin once.

## 2013-11-20 NOTE — Assessment & Plan Note (Addendum)
Now on Flomax by Dr. Broadus John.  His last PSA was under 10

## 2013-11-20 NOTE — Assessment & Plan Note (Signed)
Did not take alendronate due to failure to remember  a weekly dose  Taking  60 mg calcium twice daily

## 2013-11-20 NOTE — Patient Instructions (Addendum)
You are experiencing "caregiver fatigue"    Regarding Eric Hicks's decline.  Please talk with your childfren and arrange time each week for you to have 'respite" 9time away from her to relax) for a few hours.    I advise you to call [facility]   [phone]) and inquire about the "dementia seminar for families" that they sponsor once a year.   I recommend getting the majority of your calcium and Vitamin D  through diet rather than supplements given the recent association of calcium supplements with increased coronary artery calcium scores (You need 1200 mg daily )   Unsweetened almond/coconut milk is a great low calorie low carb, cholesterol free  way to increase your dietary calcium and vitamin D.  Try the blue Jackquline Bosch .  One glass substitutes for one calcium tablet   We will repeat your DEXA in 2016.  If there is progression of osteoporosis we will  petition your insurance to get Prolia.   Please ask Dr Broadus John  to send me his office notes   You had your annual  wellness exam today.     You received the  Prevnar  vaccine today.  If you develop a sore arm , use tylenol and ibuprofen for a few days   Please make an appt for fasting labs at your earliest convenience.   We will contact you with the bloodwork results  We discussed STOPPING Eric Hicks's new medication and starting her on a medication for depression called mirtazipine.  The dose is 7.5 mg daily at bedtime  Have her return to see me in one month

## 2013-11-21 DIAGNOSIS — R5383 Other fatigue: Secondary | ICD-10-CM | POA: Insufficient documentation

## 2013-11-21 NOTE — Assessment & Plan Note (Addendum)
Secondary to arteriosclerosis per nephrology [facility]) .  stable   Lab Results  Component Value Date   CREATININE 1.7* 06/27/2013   Lab Results  Component Value Date   NA 141 06/27/2013   K 4.4 06/27/2013   CL 105 06/27/2013   CO2 29 06/27/2013

## 2013-11-21 NOTE — Assessment & Plan Note (Signed)
He is due for 3 yr follow up on adenomatous polyps found by Dr. Dionne Milo, but given his age,  May not be worth the risk . Refer to Dr. Vira Agar for evaluation.

## 2013-11-21 NOTE — Assessment & Plan Note (Signed)
Annual male exam was done excluding testicular and prostate exam. PSA is  Being repeated by Urologist Dr. Broadus John

## 2013-11-21 NOTE — Assessment & Plan Note (Signed)
He has a dermatitis on his ankles due to grass mites an dis requesting refill on the triamcinolone

## 2013-11-21 NOTE — Assessment & Plan Note (Signed)
Long discussion about his need for support and assistance in caring for wife [name] who has vascular dementia.

## 2013-11-21 NOTE — Assessment & Plan Note (Addendum)
Managed with hydrochlorothiazide, with stable cr and bi annual nephrology follow up for CKD.

## 2013-11-29 ENCOUNTER — Other Ambulatory Visit (INDEPENDENT_AMBULATORY_CARE_PROVIDER_SITE_OTHER): Payer: Medicare Other

## 2013-11-29 ENCOUNTER — Telehealth: Payer: Self-pay | Admitting: *Deleted

## 2013-11-29 DIAGNOSIS — E785 Hyperlipidemia, unspecified: Secondary | ICD-10-CM

## 2013-11-29 DIAGNOSIS — R5381 Other malaise: Secondary | ICD-10-CM

## 2013-11-29 DIAGNOSIS — M810 Age-related osteoporosis without current pathological fracture: Secondary | ICD-10-CM

## 2013-11-29 DIAGNOSIS — R5383 Other fatigue: Secondary | ICD-10-CM

## 2013-11-29 DIAGNOSIS — M948X9 Other specified disorders of cartilage, unspecified sites: Secondary | ICD-10-CM

## 2013-11-29 LAB — VITAMIN D 25 HYDROXY (VIT D DEFICIENCY, FRACTURES): VITD: 57.05 ng/mL (ref 30.00–100.00)

## 2013-11-29 LAB — COMPREHENSIVE METABOLIC PANEL
ALBUMIN: 3.4 g/dL — AB (ref 3.5–5.2)
ALK PHOS: 51 U/L (ref 39–117)
ALT: 11 U/L (ref 0–53)
AST: 19 U/L (ref 0–37)
BUN: 24 mg/dL — ABNORMAL HIGH (ref 6–23)
CO2: 28 mEq/L (ref 19–32)
Calcium: 9.4 mg/dL (ref 8.4–10.5)
Chloride: 108 mEq/L (ref 96–112)
Creatinine, Ser: 1.6 mg/dL — ABNORMAL HIGH (ref 0.4–1.5)
GFR: 42.92 mL/min — ABNORMAL LOW (ref >60.00)
Glucose, Bld: 85 mg/dL (ref 70–99)
Potassium: 4.1 mEq/L (ref 3.5–5.1)
Sodium: 142 mEq/L (ref 135–145)
TOTAL PROTEIN: 5.8 g/dL — AB (ref 6.0–8.3)
Total Bilirubin: 1.1 mg/dL (ref 0.2–1.2)

## 2013-11-29 LAB — LIPID PANEL
Cholesterol: 103 mg/dL (ref 0–200)
HDL: 57.7 mg/dL (ref >39.00)
LDL Cholesterol: 42 mg/dL (ref 0–99)
NonHDL: 45.3
Total CHOL/HDL Ratio: 2
Triglycerides: 19 mg/dL (ref 0.0–149.0)
VLDL: 3.8 mg/dL (ref 0.0–40.0)

## 2013-11-29 LAB — TSH: TSH: 1.18 u[IU]/mL (ref 0.35–4.50)

## 2013-11-29 NOTE — Telephone Encounter (Signed)
What labs and dx?  

## 2013-12-03 ENCOUNTER — Encounter: Payer: Self-pay | Admitting: Internal Medicine

## 2013-12-19 ENCOUNTER — Ambulatory Visit (INDEPENDENT_AMBULATORY_CARE_PROVIDER_SITE_OTHER): Payer: Medicare Other | Admitting: Internal Medicine

## 2013-12-19 ENCOUNTER — Encounter: Payer: Self-pay | Admitting: Internal Medicine

## 2013-12-19 VITALS — BP 122/64 | HR 74 | Temp 98.1°F | Resp 16 | Ht 70.0 in | Wt 180.5 lb

## 2013-12-19 DIAGNOSIS — J439 Emphysema, unspecified: Secondary | ICD-10-CM

## 2013-12-19 DIAGNOSIS — J069 Acute upper respiratory infection, unspecified: Secondary | ICD-10-CM

## 2013-12-19 DIAGNOSIS — I10 Essential (primary) hypertension: Secondary | ICD-10-CM

## 2013-12-19 DIAGNOSIS — R5383 Other fatigue: Principal | ICD-10-CM

## 2013-12-19 DIAGNOSIS — J438 Other emphysema: Secondary | ICD-10-CM

## 2013-12-19 DIAGNOSIS — R5381 Other malaise: Secondary | ICD-10-CM

## 2013-12-19 DIAGNOSIS — N182 Chronic kidney disease, stage 2 (mild): Secondary | ICD-10-CM

## 2013-12-19 DIAGNOSIS — M810 Age-related osteoporosis without current pathological fracture: Secondary | ICD-10-CM

## 2013-12-19 MED ORDER — ALENDRONATE SODIUM 70 MG PO TABS: 70.0000 mg | ORAL_TABLET | ORAL | Status: DC

## 2013-12-19 NOTE — Progress Notes (Signed)
Pre-visit discussion using our clinic review tool. No additional management support is needed unless otherwise documented below in the visit note.  

## 2013-12-19 NOTE — Progress Notes (Signed)
Patient ID: Eric Hicks, male   DOB: 1926/05/31, 80 y.o.   MRN: [id number]   Patient Active Problem List   Diagnosis Date Noted  . Acute upper respiratory infections of unspecified site 12/22/2013  . Caregiver with fatigue 11/21/2013  . CKD (chronic kidney disease), stage II 11/12/2012  . Hypertension 11/12/2012  . Drug rash 11/10/2012  . Routine general medical examination at a health care facility 11/10/2012  . Osteoporosis 01/03/2012  . History of adenomatous polyp of colon 11/14/2011  . COPD (chronic obstructive pulmonary disease) with emphysema 11/14/2011  . BPH (benign prostatic hyperplasia)   . Elevated PSA, between 10 and less than 20 ng/ml     Subjective:  CC:   Chief Complaint  Patient presents with  . Follow-up  . Hypertension    HPI:   Eric Hicks is a 80 y.o. male who presents for Follow up on chronic conditions including hypertension , and COPD.  He is suffering from a URI currently.  He denies fever, headache, pleurisy and purulent sinus drainage.   He has been spending time in [place] because his brother started chemo at [facility] for MM.,  Then developed AMI with CHF with leaky valve,  now hospitalized at [facility] , Needs valve surgery but too sick .  Prior stroke      Past Medical History  Diagnosis Date  . COPD (chronic obstructive pulmonary disease)   . Hypertension   . BPH (benign prostatic hyperplasia)     managed by Olena Heckle  . Elevated PSA, between 10 and less than 20 ng/ml     Olena Heckle, watchful waiting  . Osteoporosis     by DEXA    History reviewed. No pertinent past surgical history.     The following portions of the patient's history were reviewed and updated as appropriate: Allergies, current medications, and problem list.    Review of Systems:   Patient denies headache, fevers, malaise, unintentional weight loss, skin rash, eye pain, sinus congestion and sinus pain, sore throat, dysphagia,  hemoptysis , cough, dyspnea,  wheezing, chest pain, palpitations, orthopnea, edema, abdominal pain, nausea, melena, diarrhea, constipation, flank pain, dysuria, hematuria, urinary  Frequency, nocturia, numbness, tingling, seizures,  Focal weakness, Loss of consciousness,  Tremor, insomnia, depression, anxiety, and suicidal ideation.     History   Social History  . Marital Status: Married    Spouse Name: N/A    Number of Children: N/A  . Years of Education: N/A   Occupational History  . Not on file.   Social History Main Topics  . Smoking status: Former Research scientist (life sciences)  . Smokeless tobacco: Never Used  . Alcohol Use: No  . Drug Use: Not on file  . Sexual Activity: Not on file   Other Topics Concern  . Not on file   Social History Narrative   Regular exercise-yew    Objective:  Filed Vitals:   12/19/13 1201  BP: 122/64  Pulse: 74  Temp: 98.1 F (36.7 C)  Resp: 16     General appearance: alert, cooperative and appears stated age Ears: normal TM's and external ear canals both ears Throat: lips, mucosa, and tongue normal; teeth and gums normal Neck: no adenopathy, no carotid bruit, supple, symmetrical, trachea midline and thyroid not enlarged, symmetric, no tenderness/mass/nodules Back: symmetric, no curvature. ROM normal. No CVA tenderness. Lungs: clear to auscultation bilaterally Heart: regular rate and rhythm, S1, S2 normal, no murmur, click, rub or gallop Abdomen: soft, non-tender; bowel sounds normal; no masses,  no organomegaly Pulses: 2+ and symmetric Skin: Skin color, texture, turgor normal. No rashes or lesions Lymph nodes: Cervical, supraclavicular, and axillary nodes normal.  Assessment and Plan:  Hypertension Well controlled on current regimen. Renal function stable, no changes today.  Lab Results  Component Value Date   CREATININE 1.6* 11/29/2013   Lab Results  Component Value Date   NA 142 11/29/2013   K 4.1 11/29/2013   CL 108 11/29/2013   CO2 28 11/29/2013     COPD (chronic  obstructive pulmonary disease) with emphysema He remains asymptomatic on current medications. No changes today.    Osteoporosis He is not taking alendronate due to failure to remember  a weekly dose  Taking  600 mg calcium twice daily     CKD (chronic kidney disease), stage II Secondary to arteriosclerosis per nephrology [facility]) .  stable   Lab Results  Component Value Date   CREATININE 1.6* 11/29/2013   Lab Results  Component Value Date   NA 142 11/29/2013   K 4.1 11/29/2013   CL 108 11/29/2013   CO2 28 11/29/2013       Acute upper respiratory infections of unspecified site This URI is most likely viral given the  mild HEENT  symptoms .   I have explained that in viral URIS, an antibiotic will not help the symptoms and will increase the risk of developing diarrhea.,  Continue oral and nasal decongestants,  Ibuprofen 400 mg and tylenol 650 mq 8 hrs for aches and pains,  And will add tessalon 100 mg every 8 hours prn cough     Updated Medication List Outpatient Encounter Prescriptions as of 12/19/2013  Medication Sig  . ADVAIR DISKUS 250-50 MCG/DOSE AEPB INHALE 1 PUFF INTO THE LUNGS TWICE A DAY  . AMBULATORY NON FORMULARY MEDICATION Prostate Formula  With Saw Palmetto 2 tablets daily  . AMBULATORY NON FORMULARY MEDICATION Joint Advantage Gold 2 tablets daily  . calcium carbonate (OS-CAL) 600 MG TABS Take 600 mg by mouth 2 (two) times daily.  Marland Kitchen doxazosin (CARDURA) 4 MG tablet TAKE 1 TABLET BY MOUTH AT BEDTIME  . Ergocalciferol (VITAMIN D2) 400 UNITS TABS Take 1 tablet by mouth daily.  . finasteride (PROSCAR) 5 MG tablet Take 5 mg by mouth daily.  . hydrochlorothiazide (HYDRODIURIL) 25 MG tablet TAKE 1 TABLET BY MOUTH IN THE MORNING  . meclizine (ANTIVERT) 25 MG tablet Take 25 mg by mouth as needed.  . triamcinolone cream (KENALOG) 0.1 % Apply 1 application topically 2 (two) times daily.  Marland Kitchen zoster vaccine live, PF, (ZOSTAVAX) 22979 UNT/0.65ML injection Inject 19,400 Units into the skin  once.  Marland Kitchen alendronate (FOSAMAX) 70 MG tablet Take 1 tablet (70 mg total) by mouth every 7 (seven) days. Take with a full glass of water on an empty stomach.  . [DISCONTINUED] alendronate (FOSAMAX) 70 MG tablet Take 70 mg by mouth every 7 (seven) days. Take with a full glass of water on an empty stomach.     Orders Placed This Encounter  Procedures  . CBC with Differential    No Follow-up on file.

## 2013-12-22 ENCOUNTER — Encounter: Payer: Self-pay | Admitting: Internal Medicine

## 2013-12-22 DIAGNOSIS — J069 Acute upper respiratory infection, unspecified: Secondary | ICD-10-CM | POA: Insufficient documentation

## 2013-12-22 NOTE — Assessment & Plan Note (Signed)
He remains asymptomatic on current medications. No changes today.

## 2013-12-22 NOTE — Assessment & Plan Note (Addendum)
He is not taking alendronate due to failure to remember  a weekly dose  Taking  600 mg calcium twice daily

## 2013-12-22 NOTE — Assessment & Plan Note (Signed)
This URI is most likely viral given the  mild HEENT  symptoms .   I have explained that in viral URIS, an antibiotic will not help the symptoms and will increase the risk of developing diarrhea.,  Continue oral and nasal decongestants,  Ibuprofen 400 mg and tylenol 650 mq 8 hrs for aches and pains,  And will add tessalon 100 mg every 8 hours prn cough

## 2013-12-22 NOTE — Assessment & Plan Note (Signed)
Secondary to arteriosclerosis per nephrology [facility]) .  stable   Lab Results  Component Value Date   CREATININE 1.6* 11/29/2013   Lab Results  Component Value Date   NA 142 11/29/2013   K 4.1 11/29/2013   CL 108 11/29/2013   CO2 28 11/29/2013

## 2013-12-22 NOTE — Assessment & Plan Note (Signed)
Well controlled on current regimen. Renal function stable, no changes today.  Lab Results  Component Value Date   CREATININE 1.6* 11/29/2013   Lab Results  Component Value Date   NA 142 11/29/2013   K 4.1 11/29/2013   CL 108 11/29/2013   CO2 28 11/29/2013

## 2013-12-24 ENCOUNTER — Other Ambulatory Visit (INDEPENDENT_AMBULATORY_CARE_PROVIDER_SITE_OTHER): Payer: Medicare Other

## 2013-12-24 DIAGNOSIS — R5383 Other fatigue: Principal | ICD-10-CM

## 2013-12-24 DIAGNOSIS — R5381 Other malaise: Secondary | ICD-10-CM

## 2013-12-24 LAB — CBC WITH DIFFERENTIAL/PLATELET
Basophils Absolute: 0 10*3/uL (ref 0.0–0.1)
Basophils Relative: 0.7 % (ref 0.0–3.0)
EOS ABS: 0.3 10*3/uL (ref 0.0–0.7)
EOS PCT: 5 % (ref 0.0–5.0)
HEMATOCRIT: 41.5 % (ref 39.0–52.0)
Hemoglobin: 13.7 g/dL (ref 13.0–17.0)
LYMPHS ABS: 1.5 10*3/uL (ref 0.7–4.0)
Lymphocytes Relative: 25.1 % (ref 12.0–46.0)
MCHC: 32.9 g/dL (ref 30.0–36.0)
MCV: 97.7 fl (ref 78.0–100.0)
MONO ABS: 0.5 10*3/uL (ref 0.1–1.0)
Monocytes Relative: 7.8 % (ref 3.0–12.0)
NEUTROS ABS: 3.7 10*3/uL (ref 1.4–7.7)
Neutrophils Relative %: 61.4 % (ref 43.0–77.0)
Platelets: 203 10*3/uL (ref 150.0–400.0)
RBC: 4.25 Mil/uL (ref 4.22–5.81)
RDW: 13.7 % (ref 11.5–15.5)
WBC: 6 10*3/uL (ref 4.0–10.5)

## 2013-12-25 ENCOUNTER — Encounter: Payer: Self-pay | Admitting: Internal Medicine

## 2014-02-13 ENCOUNTER — Other Ambulatory Visit: Payer: Self-pay | Admitting: Internal Medicine

## 2014-03-26 ENCOUNTER — Other Ambulatory Visit: Payer: Self-pay | Admitting: *Deleted

## 2014-03-26 MED ORDER — DOXAZOSIN MESYLATE 4 MG PO TABS: ORAL_TABLET | ORAL | Status: DC

## 2014-03-26 MED ORDER — FLUTICASONE-SALMETEROL 250-50 MCG/DOSE IN AEPB: INHALATION_SPRAY | RESPIRATORY_TRACT | Status: DC

## 2014-03-26 MED ORDER — HYDROCHLOROTHIAZIDE 25 MG PO TABS: ORAL_TABLET | ORAL | Status: DC

## 2014-04-10 ENCOUNTER — Ambulatory Visit (INDEPENDENT_AMBULATORY_CARE_PROVIDER_SITE_OTHER): Payer: Medicare Other | Admitting: Internal Medicine

## 2014-04-10 ENCOUNTER — Encounter: Payer: Self-pay | Admitting: Internal Medicine

## 2014-04-10 VITALS — BP 122/70 | HR 75 | Temp 97.5°F | Resp 16 | Ht 70.0 in | Wt 179.0 lb

## 2014-04-10 DIAGNOSIS — M25551 Pain in right hip: Secondary | ICD-10-CM

## 2014-04-10 DIAGNOSIS — M25552 Pain in left hip: Secondary | ICD-10-CM

## 2014-04-10 DIAGNOSIS — G8929 Other chronic pain: Secondary | ICD-10-CM

## 2014-04-10 MED ORDER — MELOXICAM 15 MG PO TABS: 15.0000 mg | ORAL_TABLET | Freq: Every day | ORAL | Status: DC

## 2014-04-10 MED ORDER — ALENDRONATE SODIUM 70 MG PO TABS: 70.0000 mg | ORAL_TABLET | ORAL | Status: DC

## 2014-04-10 MED ORDER — TRAMADOL HCL 50 MG PO TABS: 50.0000 mg | ORAL_TABLET | Freq: Three times a day (TID) | ORAL | Status: DC | PRN

## 2014-04-10 NOTE — Progress Notes (Addendum)
Patient ID: Eric Hicks, male   DOB: 12/18/1926, 80 y.o.   MRN: [id number]    Patient Active Problem List   Diagnosis Date Noted  . Chronic hip pain 04/13/2014  . Acute upper respiratory infections of unspecified site 12/22/2013  . Caregiver with fatigue 11/21/2013  . CKD (chronic kidney disease), stage II 11/12/2012  . Hypertension 11/12/2012  . Drug rash 11/10/2012  . Routine general medical examination at a health care facility 11/10/2012  . Osteoporosis 01/03/2012  . History of adenomatous polyp of colon 11/14/2011  . COPD (chronic obstructive pulmonary disease) with emphysema 11/14/2011  . BPH (benign prostatic hyperplasia)   . Elevated PSA, between 10 and less than 20 ng/ml     Subjective:  CC:   Chief Complaint  Patient presents with  . Muscle Pain    HPI:   Eric Hicks is a 80 y.o. male who presents for  Bilateral hip pain right worse than left.  His pain is aggravated by sitting for prolonged periods of time and by crossing his legs,  . He reports feeling very stiff when he first stands up and feels unsteady on his feet ,  His hip pain is aggr. Has stopped walking because the hips bother him more afterward, and he develops soreness in anterior thighs with prolonged movement.  Denies low back pain of any moderate degree , but has pain over the iliac crest on the right side,  No prior orthopedic evaluation.  Denies calf pain with ambulation   Past Medical History  Diagnosis Date  . COPD (chronic obstructive pulmonary disease)   . Hypertension   . BPH (benign prostatic hyperplasia)     managed by Olena Heckle  . Elevated PSA, between 10 and less than 20 ng/ml     Olena Heckle, watchful waiting  . Osteoporosis     by DEXA    No past surgical history on file.     The following portions of the patient's history were reviewed and updated as appropriate: Allergies, current medications, and problem list.    Review of Systems:   Patient denies headache, fevers, malaise,  unintentional weight loss, skin rash, eye pain, sinus congestion and sinus pain, sore throat, dysphagia,  hemoptysis , cough, dyspnea, wheezing, chest pain, palpitations, orthopnea, edema, abdominal pain, nausea, melena, diarrhea, constipation, flank pain, dysuria, hematuria, urinary  Frequency, nocturia, numbness, tingling, seizures,  Focal weakness, Loss of consciousness,  Tremor, insomnia, depression, anxiety, and suicidal ideation.     History   Social History  . Marital Status: Married    Spouse Name: N/A    Number of Children: N/A  . Years of Education: N/A   Occupational History  . Not on file.   Social History Main Topics  . Smoking status: Former Research scientist (life sciences)  . Smokeless tobacco: Never Used  . Alcohol Use: No  . Drug Use: Not on file  . Sexual Activity: Not on file   Other Topics Concern  . Not on file   Social History Narrative   Regular exercise-yew    Objective:  Filed Vitals:   04/10/14 0827  BP: 122/70  Pulse: 75  Temp: 97.5 F (36.4 C)  Resp: 16     General appearance: alert, cooperative and appears stated age Neck: no adenopathy, no carotid bruit, supple, symmetrical, trachea midline and thyroid not enlarged, symmetric, no tenderness/mass/nodules Back: symmetric, no curvature. ROM normal. No CVA tenderness. Lungs: clear to auscultation bilaterally Heart: regular rate and rhythm, S1, S2 normal, no murmur, click,  rub or gallop Abdomen: soft, non-tender; bowel sounds normal; no masses,  no organomegaly Pulses: 2+ and symmetric bilaterally with regard to femoral and DP Skin: Skin color, texture, turgor normal. 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  Referral to Orthopedics offered. meloxicam and tramadol offered for control of pain.        Relevant Medications      meloxicam (MOBIC) tablet      traMADol (ULTRAM) tablet 50 mg    Other Visit Diagnoses    Hip pain, bilateral    -  Primary    Relevant Orders       DG Hip Bilateral W/Pelvis

## 2014-04-10 NOTE — Patient Instructions (Signed)
YOU MAVE DEGENERATIVE ARTHRITIS OF THE HIPS.  I AM ORDERING X RAYS   STOP THE ALEVE  START MELOXICAM ONCE A DAY,  ADD TYLENOL 3 TIMES  DAILY AND /OR TRAMADOL 3 TIMES DAILY

## 2014-04-10 NOTE — Progress Notes (Signed)
Pre visit review using our clinic review tool, if applicable. No additional management support is needed unless otherwise documented below in the visit note. 

## 2014-04-11 ENCOUNTER — Ambulatory Visit: Payer: Self-pay | Admitting: Internal Medicine

## 2014-04-12 ENCOUNTER — Telehealth: Payer: Self-pay | Admitting: Internal Medicine

## 2014-04-12 DIAGNOSIS — M160 Bilateral primary osteoarthritis of hip: Secondary | ICD-10-CM

## 2014-04-13 ENCOUNTER — Encounter: Payer: Self-pay | Admitting: Internal Medicine

## 2014-04-13 DIAGNOSIS — M25559 Pain in unspecified hip: Secondary | ICD-10-CM

## 2014-04-13 DIAGNOSIS — G8929 Other chronic pain: Secondary | ICD-10-CM | POA: Insufficient documentation

## 2014-04-13 NOTE — Assessment & Plan Note (Addendum)
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  Referral to Orthopedics offered. meloxicam and tramadol offered for control of pain.

## 2014-05-15 ENCOUNTER — Other Ambulatory Visit (INDEPENDENT_AMBULATORY_CARE_PROVIDER_SITE_OTHER): Payer: Medicare Other

## 2014-05-15 ENCOUNTER — Other Ambulatory Visit: Payer: Self-pay | Admitting: Internal Medicine

## 2014-05-15 DIAGNOSIS — R972 Elevated prostate specific antigen [PSA]: Secondary | ICD-10-CM

## 2014-05-15 DIAGNOSIS — N400 Enlarged prostate without lower urinary tract symptoms: Secondary | ICD-10-CM

## 2014-05-15 LAB — PSA, MEDICARE: PSA: 13.67 ng/mL — AB (ref 0.10–4.00)

## 2014-05-18 ENCOUNTER — Encounter: Payer: Self-pay | Admitting: Internal Medicine

## 2014-05-21 ENCOUNTER — Encounter: Payer: Self-pay | Admitting: Internal Medicine

## 2014-05-22 ENCOUNTER — Encounter: Payer: Self-pay | Admitting: Internal Medicine

## 2014-06-13 DIAGNOSIS — H906 Mixed conductive and sensorineural hearing loss, bilateral: Secondary | ICD-10-CM | POA: Diagnosis not present

## 2014-06-17 ENCOUNTER — Telehealth: Payer: Self-pay | Admitting: Internal Medicine

## 2014-06-17 DIAGNOSIS — N183 Chronic kidney disease, stage 3 unspecified: Secondary | ICD-10-CM

## 2014-06-17 NOTE — Telephone Encounter (Signed)
[facility] sent request to MD for labs to be drawn and copy faxed to their office.  BMP and Protein/creatinine ratio Urine. I have put these orders in Patient has no future appointment scheduled does patient need appointment with MD or just call and schedule lab.

## 2014-06-17 NOTE — Telephone Encounter (Signed)
He should have a 6 month follow up at the appropriate time, and can just schedule labs for now

## 2014-06-18 DIAGNOSIS — J301 Allergic rhinitis due to pollen: Secondary | ICD-10-CM | POA: Diagnosis not present

## 2014-06-18 DIAGNOSIS — H906 Mixed conductive and sensorineural hearing loss, bilateral: Secondary | ICD-10-CM | POA: Diagnosis not present

## 2014-06-18 NOTE — Telephone Encounter (Signed)
Lab scheduled patient notified.

## 2014-06-19 ENCOUNTER — Other Ambulatory Visit: Payer: Self-pay | Admitting: Internal Medicine

## 2014-06-19 DIAGNOSIS — N183 Chronic kidney disease, stage 3 unspecified: Secondary | ICD-10-CM

## 2014-06-20 ENCOUNTER — Other Ambulatory Visit (INDEPENDENT_AMBULATORY_CARE_PROVIDER_SITE_OTHER): Payer: Medicare Other

## 2014-06-20 DIAGNOSIS — N183 Chronic kidney disease, stage 3 unspecified: Secondary | ICD-10-CM

## 2014-06-20 LAB — BASIC METABOLIC PANEL
BUN: 38 mg/dL — AB (ref 6–23)
CHLORIDE: 107 meq/L (ref 96–112)
CO2: 26 mEq/L (ref 19–32)
Calcium: 8.9 mg/dL (ref 8.4–10.5)
Creatinine, Ser: 1.82 mg/dL — ABNORMAL HIGH (ref 0.40–1.50)
GFR: 37.75 mL/min — ABNORMAL LOW (ref >60.00)
Glucose, Bld: 98 mg/dL (ref 70–99)
POTASSIUM: 4.2 meq/L (ref 3.5–5.1)
Sodium: 141 mEq/L (ref 135–145)

## 2014-06-20 LAB — MICROALBUMIN / CREATININE URINE RATIO
Creatinine,U: 136.8 mg/dL
Microalb Creat Ratio: 0.5 mg/g (ref 0.0–30.0)
Microalb, Ur: <0.7 mg/dL (ref 0.0–1.9)

## 2014-07-01 DIAGNOSIS — I129 Hypertensive chronic kidney disease with stage 1 through stage 4 chronic kidney disease, or unspecified chronic kidney disease: Secondary | ICD-10-CM | POA: Diagnosis not present

## 2014-07-01 DIAGNOSIS — N400 Enlarged prostate without lower urinary tract symptoms: Secondary | ICD-10-CM | POA: Diagnosis not present

## 2014-07-01 DIAGNOSIS — N200 Calculus of kidney: Secondary | ICD-10-CM | POA: Diagnosis not present

## 2014-07-01 DIAGNOSIS — N183 Chronic kidney disease, stage 3 (moderate): Secondary | ICD-10-CM | POA: Diagnosis not present

## 2014-07-02 DIAGNOSIS — H906 Mixed conductive and sensorineural hearing loss, bilateral: Secondary | ICD-10-CM | POA: Diagnosis not present

## 2014-07-02 DIAGNOSIS — H6521 Chronic serous otitis media, right ear: Secondary | ICD-10-CM | POA: Diagnosis not present

## 2014-07-02 DIAGNOSIS — J301 Allergic rhinitis due to pollen: Secondary | ICD-10-CM | POA: Diagnosis not present

## 2014-10-13 ENCOUNTER — Other Ambulatory Visit: Payer: Self-pay | Admitting: Internal Medicine

## 2014-10-25 DIAGNOSIS — S93422A Sprain of deltoid ligament of left ankle, initial encounter: Secondary | ICD-10-CM | POA: Diagnosis not present

## 2014-10-25 DIAGNOSIS — M7062 Trochanteric bursitis, left hip: Secondary | ICD-10-CM | POA: Diagnosis not present

## 2014-10-25 DIAGNOSIS — M7061 Trochanteric bursitis, right hip: Secondary | ICD-10-CM | POA: Diagnosis not present

## 2014-11-13 ENCOUNTER — Other Ambulatory Visit: Payer: Self-pay | Admitting: Internal Medicine

## 2014-11-20 DIAGNOSIS — R972 Elevated prostate specific antigen [PSA]: Secondary | ICD-10-CM | POA: Diagnosis not present

## 2014-11-20 DIAGNOSIS — N401 Enlarged prostate with lower urinary tract symptoms: Secondary | ICD-10-CM | POA: Diagnosis not present

## 2014-11-22 DIAGNOSIS — H4922 Sixth [abducent] nerve palsy, left eye: Secondary | ICD-10-CM | POA: Diagnosis not present

## 2014-12-02 DIAGNOSIS — M7062 Trochanteric bursitis, left hip: Secondary | ICD-10-CM | POA: Diagnosis not present

## 2014-12-02 DIAGNOSIS — M7061 Trochanteric bursitis, right hip: Secondary | ICD-10-CM | POA: Diagnosis not present

## 2014-12-02 DIAGNOSIS — S93422D Sprain of deltoid ligament of left ankle, subsequent encounter: Secondary | ICD-10-CM | POA: Diagnosis not present

## 2014-12-04 ENCOUNTER — Encounter: Payer: Medicare Other | Admitting: Internal Medicine

## 2014-12-11 ENCOUNTER — Other Ambulatory Visit: Payer: Self-pay | Admitting: Internal Medicine

## 2014-12-11 DIAGNOSIS — E559 Vitamin D deficiency, unspecified: Secondary | ICD-10-CM

## 2014-12-11 DIAGNOSIS — Z113 Encounter for screening for infections with a predominantly sexual mode of transmission: Secondary | ICD-10-CM

## 2014-12-11 DIAGNOSIS — N400 Enlarged prostate without lower urinary tract symptoms: Secondary | ICD-10-CM

## 2014-12-11 DIAGNOSIS — I10 Essential (primary) hypertension: Secondary | ICD-10-CM

## 2014-12-11 NOTE — Telephone Encounter (Signed)
Last OV 12.16.15, last refill 7.20.16.  Please advise refill

## 2014-12-13 NOTE — Telephone Encounter (Signed)
Ok to refill,  Refill sent , but needs OV and lab visit prior  for hypertension follow up, fasting labs ordered

## 2014-12-15 ENCOUNTER — Other Ambulatory Visit: Payer: Self-pay | Admitting: Internal Medicine

## 2014-12-16 NOTE — Telephone Encounter (Signed)
Last OV 12.16.15.  Please advise refill

## 2014-12-16 NOTE — Telephone Encounter (Signed)
90 day supply authorized and sent   

## 2014-12-23 ENCOUNTER — Encounter: Payer: Self-pay | Admitting: Internal Medicine

## 2014-12-23 ENCOUNTER — Ambulatory Visit (INDEPENDENT_AMBULATORY_CARE_PROVIDER_SITE_OTHER): Payer: Medicare Other | Admitting: Internal Medicine

## 2014-12-23 VITALS — BP 130/64 | HR 61 | Temp 97.6°F | Resp 14 | Ht 70.25 in | Wt 178.5 lb

## 2014-12-23 DIAGNOSIS — E559 Vitamin D deficiency, unspecified: Secondary | ICD-10-CM | POA: Diagnosis not present

## 2014-12-23 DIAGNOSIS — Z0000 Encounter for general adult medical examination without abnormal findings: Secondary | ICD-10-CM | POA: Diagnosis not present

## 2014-12-23 DIAGNOSIS — Z113 Encounter for screening for infections with a predominantly sexual mode of transmission: Secondary | ICD-10-CM | POA: Diagnosis not present

## 2014-12-23 DIAGNOSIS — I10 Essential (primary) hypertension: Secondary | ICD-10-CM | POA: Diagnosis not present

## 2014-12-23 DIAGNOSIS — M810 Age-related osteoporosis without current pathological fracture: Secondary | ICD-10-CM

## 2014-12-23 LAB — COMPREHENSIVE METABOLIC PANEL
ALBUMIN: 3.8 g/dL (ref 3.5–5.2)
ALT: 10 U/L (ref 0–53)
AST: 16 U/L (ref 0–37)
Alkaline Phosphatase: 50 U/L (ref 39–117)
BUN: 23 mg/dL (ref 6–23)
CHLORIDE: 106 meq/L (ref 96–112)
CO2: 28 mEq/L (ref 19–32)
Calcium: 9.3 mg/dL (ref 8.4–10.5)
Creatinine, Ser: 1.57 mg/dL — ABNORMAL HIGH (ref 0.40–1.50)
GFR: 44.71 mL/min — ABNORMAL LOW (ref >60.00)
GLUCOSE: 88 mg/dL (ref 70–99)
POTASSIUM: 4.2 meq/L (ref 3.5–5.1)
SODIUM: 141 meq/L (ref 135–145)
Total Bilirubin: 0.8 mg/dL (ref 0.2–1.2)
Total Protein: 6.4 g/dL (ref 6.0–8.3)

## 2014-12-23 LAB — LIPID PANEL
CHOLESTEROL: 98 mg/dL (ref 0–200)
HDL: 61.5 mg/dL (ref >39.00)
LDL CALC: 29 mg/dL (ref 0–99)
NonHDL: 36.18
Total CHOL/HDL Ratio: 2
Triglycerides: 37 mg/dL (ref 0.0–149.0)
VLDL: 7.4 mg/dL (ref 0.0–40.0)

## 2014-12-23 LAB — VITAMIN D 25 HYDROXY (VIT D DEFICIENCY, FRACTURES): VITD: 65.18 ng/mL (ref 30.00–100.00)

## 2014-12-23 NOTE — Progress Notes (Signed)
Patient ID: Eric Hicks, male    DOB: November 01, 1926  Age: 81 y.o. MRN: [id number]  The patient is here for annual Medicare wellness examination and management of other chronic and acute problems.   The risk factors are reflected in the social history.  The roster of all physicians providing medical care to patient - is listed in the Snapshot section of the chart.  Activities of daily living:  The patient is 100% independent in all ADLs: dressing, toileting, feeding as well as independent mobility  Home safety : The patient has smoke detectors in the home. They wear seatbelts.  There are no firearms at home. There is no violence in the home.   There is no risks for hepatitis, STDs or HIV. There is no   history of blood transfusion. They have no travel history to infectious disease endemic areas of the world.  The patient has seen their dentist in the last six month. They have seen their eye doctor in the last year. They admit to slight hearing difficulty with regard to whispered voices and some television programs.  They have deferred audiologic testing in the last year.  They do not  have excessive sun exposure. Discussed the need for sun protection: hats, long sleeves and use of sunscreen if there is significant sun exposure.   Diet: the importance of a healthy diet is discussed. They do have a healthy diet.  The benefits of regular aerobic exercise were discussed. 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   Depression screen: there are no signs or vegative symptoms of depression- irritability, change in appetite, anhedonia, sadness/tearfullness.  Cognitive assessment: the patient manages all their financial and personal affairs and is actively engaged. They could relate day,date,year and events; recalled 2/3 objects at 3 minutes; performed clock-face test normally. He meets regularly with fellow retirees regularly .  The following portions of the patient's history were  reviewed and updated as appropriate: allergies, current medications, past family history, past medical history,  past surgical history, past social history  and problem list.  Visual acuity was not assessed per patient preference since she has regular follow up with her ophthalmologist. Hearing and body mass index were assessed and reviewed.   During the course of the visit the patient was educated and counseled about appropriate screening and preventive services including : fall prevention , diabetes screening, nutrition counseling, colorectal cancer screening, and recommended immunizations.    CC: The primary encounter diagnosis was Medicare annual wellness visit, subsequent. Diagnoses of Essential hypertension, Vitamin D deficiency, Screen for STD (sexually transmitted disease), and Osteoporosis were also pertinent to this visit.  His episode of right hip bursitis was  treated last December by Barrie Lyme with meloxicam.  Then he developed pain in his right foot attributed to a strained tendon treated in July with a foot brace and mobic  Cc:  Hearing loss, despite wearing hearing aids since he wa s 34 in 2011  .  Hearing test done and ENT said the issue was fluid in inner ear, so ENT aspirated it.  Hearing improved transiently ,  Has not returned yet to  Dr Pryor Ochoa  Dr [name],  Nephrology [facility].   Dr Broadus John for Urology follow up  in [facility]  For PSA 17,  Finasteride lowered it ,  To 10 , then it rose to 13,  Has follow up in February with  biopsy planned   Trace hematuria found in a  in  urine sample  May have been due to tractor trauma the day before.  Saw new ophthalmologist who took for [facility]     Caregiver fatigue , wife has CKD stage 4 and dementia .  He admits to lacking patience wiith wife [name],  Due to her forgetfulness. . She is getting less motivated to walk and subseuqently becoming  weaker     History Sosaia has a past medical history of COPD (chronic obstructive  pulmonary disease); Hypertension; BPH (benign prostatic hyperplasia); Elevated PSA, between 10 and less than 20 ng/ml; and Osteoporosis.   He has no past surgical history on file.   His family history includes Cancer (age of onset: 8) in his brother; Diabetes in his mother; Hypertension in his father.He reports that he has quit smoking. He has never used smokeless tobacco. He reports that he does not drink alcohol. His drug history is not on file.  Outpatient Prescriptions Prior to Visit  Medication Sig Dispense Refill  . ADVAIR DISKUS 250-50 MCG/DOSE AEPB INHALE 1 PUFF INTO THE LUNGS TWICE A DAY 180 each 1  . alendronate (FOSAMAX) 70 MG tablet Take 1 tablet (70 mg total) by mouth every 7 (seven) days. Take with a full glass of water on an empty stomach. 12 tablet 3  . AMBULATORY NON FORMULARY MEDICATION Prostate Formula  With Saw Palmetto 2 tablets daily    . AMBULATORY NON FORMULARY MEDICATION Joint Advantage Gold 2 tablets daily    . calcium carbonate (OS-CAL) 600 MG TABS Take 600 mg by mouth 2 (two) times daily.    Marland Kitchen doxazosin (CARDURA) 4 MG tablet TAKE 1 TABLET BY MOUTH AT BEDTIME 30 tablet 5  . Ergocalciferol (VITAMIN D2) 400 UNITS TABS Take 1 tablet by mouth 2 (two) times daily.     . finasteride (PROSCAR) 5 MG tablet Take 5 mg by mouth daily.    . meclizine (ANTIVERT) 25 MG tablet Take 25 mg by mouth as needed.    . meloxicam (MOBIC) 15 MG tablet Take 1 tablet (15 mg total) by mouth daily. (Patient taking differently: Take 15 mg by mouth daily as needed. ) 90 tablet 1  . triamcinolone cream (KENALOG) 0.1 % Apply 1 application topically 2 (two) times daily. 454 g 1  . hydrochlorothiazide (HYDRODIURIL) 25 MG tablet TAKE 1 TABLET BY MOUTH IN THE MORNING 90 tablet 1  . zoster vaccine live, PF, (ZOSTAVAX) 63016 UNT/0.65ML injection Inject 19,400 Units into the skin once. 1 each 0  . ADVAIR DISKUS 250-50 MCG/DOSE AEPB INHALE 1 PUFF INTO THE LUNGS TWICE A DAY 60 each 3  . traMADol (ULTRAM)  50 MG tablet Take 1 tablet (50 mg total) by mouth every 8 (eight) hours as needed. (Patient not taking: Reported on 12/23/2014) 90 tablet 3   No facility-administered medications prior to visit.    Review of Systems   Patient denies headache, fevers, malaise, unintentional weight loss, skin rash, eye pain, sinus congestion and sinus pain, sore throat, dysphagia,  hemoptysis , cough, dyspnea, wheezing, chest pain, palpitations, orthopnea, edema, abdominal pain, nausea, melena, diarrhea, constipation, flank pain, dysuria, hematuria, urinary  Frequency, nocturia, numbness, tingling, seizures,  Focal weakness, Loss of consciousness,  Tremor, insomnia, depression, anxiety, and suicidal ideation.      Objective:  BP 130/64 mmHg  Pulse 61  Temp(Src) 97.6 F (36.4 C) (Oral)  Resp 14  Ht 5' 10.25" (1.784 m)  Wt 178 lb 8 oz (80.967 kg)  BMI 25.44 kg/m2  SpO2 95%  Physical Exam   General appearance: alert, cooperative and appears stated age Ears: normal TM's and external ear canals both ears Throat: lips, mucosa, and tongue normal; teeth and gums normal Neck: no adenopathy, no carotid bruit, supple, symmetrical, trachea midline and thyroid not enlarged, symmetric, no tenderness/mass/nodules Back: symmetric, no curvature. ROM normal. No CVA tenderness. Lungs: clear to auscultation bilaterally Heart: regular rate and rhythm, S1, S2 normal, no murmur, click, rub or gallop Abdomen: soft, non-tender; bowel sounds normal; no masses,  no organomegaly Pulses: 2+ and symmetric Skin: Skin color, texture, turgor normal. No rashes or lesions Lymph nodes: Cervical, supraclavicular, and axillary nodes normal.    Assessment & Plan:   Problem List Items Addressed This Visit      Unprioritized   Osteoporosis    Managed with alendronate.       Hypertension    Well controlled on current regimen. Renal function stable, no changes today.  Lab Results  Component Value Date   CREATININE 1.57*  12/23/2014         Medicare annual wellness visit, subsequent - Primary    Annual Medicare wellness  exam was done as well as a comprehensive physical exam and management of acute and chronic conditions .  During the course of the visit the patient was educated and counseled about appropriate screening and preventive services including : fall prevention , diabetes screening, nutrition counseling, colorectal cancer screening, and recommended immunizations.  Printed recommendations for health maintenance screenings was given.        Other Visit Diagnoses    Vitamin D deficiency        Screen for STD (sexually transmitted disease)           I have discontinued Mr. Aamodt's zoster vaccine live (PF) and traMADol. I am also having him maintain his Vitamin D2, calcium carbonate, AMBULATORY NON FORMULARY MEDICATION, AMBULATORY NON FORMULARY MEDICATION, meclizine, triamcinolone cream, finasteride, meloxicam, alendronate, doxazosin, and ADVAIR DISKUS.  No orders of the defined types were placed in this encounter.    Medications Discontinued During This Encounter  Medication Reason  . ADVAIR DISKUS 250-50 MCG/DOSE AEPB Duplicate  . traMADol (ULTRAM) 50 MG tablet Patient Preference  . zoster vaccine live, PF, (ZOSTAVAX) [id number] UNT/0.65ML injection Completed Course    Follow-up: No Follow-up on file.   Crecencio Mc, MD

## 2014-12-23 NOTE — Progress Notes (Signed)
Pre-visit discussion using our clinic review tool. No additional management support is needed unless otherwise documented below in the visit note.  

## 2014-12-23 NOTE — Assessment & Plan Note (Signed)

## 2014-12-23 NOTE — Patient Instructions (Signed)

## 2014-12-24 ENCOUNTER — Other Ambulatory Visit: Payer: Self-pay | Admitting: Internal Medicine

## 2014-12-24 ENCOUNTER — Encounter: Payer: Self-pay | Admitting: Internal Medicine

## 2014-12-24 LAB — HEPATITIS C ANTIBODY: HCV Ab: NEGATIVE

## 2014-12-24 LAB — HIV ANTIBODY (ROUTINE TESTING W REFLEX): HIV 1&2 Ab, 4th Generation: NONREACTIVE

## 2014-12-24 NOTE — Assessment & Plan Note (Signed)
Managed with alendronate.

## 2014-12-24 NOTE — Assessment & Plan Note (Signed)
Well controlled on current regimen. Renal function stable, no changes today.  Lab Results  Component Value Date   CREATININE 1.57* 12/23/2014

## 2014-12-26 ENCOUNTER — Encounter: Payer: Self-pay | Admitting: Internal Medicine

## 2014-12-31 ENCOUNTER — Other Ambulatory Visit: Payer: Self-pay | Admitting: Internal Medicine

## 2015-01-09 NOTE — Telephone Encounter (Signed)
Mailed unread message to patient.  

## 2015-02-14 ENCOUNTER — Encounter: Payer: Self-pay | Admitting: Internal Medicine

## 2015-04-27 HISTORY — PX: TYMPANOSTOMY TUBE PLACEMENT: SHX32

## 2015-05-06 ENCOUNTER — Encounter: Payer: Self-pay | Admitting: Internal Medicine

## 2015-05-06 NOTE — Telephone Encounter (Signed)
FYI

## 2015-05-28 ENCOUNTER — Telehealth: Payer: Self-pay | Admitting: *Deleted

## 2015-05-28 DIAGNOSIS — N401 Enlarged prostate with lower urinary tract symptoms: Secondary | ICD-10-CM | POA: Diagnosis not present

## 2015-05-28 DIAGNOSIS — R972 Elevated prostate specific antigen [PSA]: Secondary | ICD-10-CM | POA: Diagnosis not present

## 2015-05-28 NOTE — Telephone Encounter (Signed)
In reference to labs.

## 2015-05-28 NOTE — Telephone Encounter (Signed)
Eric Hicks,Patient stated that he received a call this morning, he was not sure what the call was in reference to.  Contact [phone]

## 2015-06-03 ENCOUNTER — Other Ambulatory Visit: Payer: Self-pay | Admitting: Internal Medicine

## 2015-06-13 ENCOUNTER — Other Ambulatory Visit: Payer: Self-pay

## 2015-06-13 MED ORDER — DOXAZOSIN MESYLATE 4 MG PO TABS: 4.0000 mg | ORAL_TABLET | Freq: Every day | ORAL | Status: DC

## 2015-06-23 ENCOUNTER — Other Ambulatory Visit: Payer: Self-pay | Admitting: Internal Medicine

## 2015-06-23 DIAGNOSIS — L570 Actinic keratosis: Secondary | ICD-10-CM | POA: Diagnosis not present

## 2015-06-23 DIAGNOSIS — D492 Neoplasm of unspecified behavior of bone, soft tissue, and skin: Secondary | ICD-10-CM | POA: Diagnosis not present

## 2015-06-23 DIAGNOSIS — L821 Other seborrheic keratosis: Secondary | ICD-10-CM | POA: Diagnosis not present

## 2015-06-23 DIAGNOSIS — I781 Nevus, non-neoplastic: Secondary | ICD-10-CM | POA: Diagnosis not present

## 2015-06-23 DIAGNOSIS — L814 Other melanin hyperpigmentation: Secondary | ICD-10-CM | POA: Diagnosis not present

## 2015-07-11 DIAGNOSIS — N183 Chronic kidney disease, stage 3 (moderate): Secondary | ICD-10-CM | POA: Diagnosis not present

## 2015-07-21 DIAGNOSIS — E870 Hyperosmolality and hypernatremia: Secondary | ICD-10-CM | POA: Diagnosis not present

## 2015-07-21 DIAGNOSIS — I129 Hypertensive chronic kidney disease with stage 1 through stage 4 chronic kidney disease, or unspecified chronic kidney disease: Secondary | ICD-10-CM | POA: Diagnosis not present

## 2015-07-21 DIAGNOSIS — N200 Calculus of kidney: Secondary | ICD-10-CM | POA: Diagnosis not present

## 2015-07-21 DIAGNOSIS — N183 Chronic kidney disease, stage 3 (moderate): Secondary | ICD-10-CM | POA: Diagnosis not present

## 2015-07-21 DIAGNOSIS — N400 Enlarged prostate without lower urinary tract symptoms: Secondary | ICD-10-CM | POA: Diagnosis not present

## 2015-07-24 ENCOUNTER — Encounter: Payer: Self-pay | Admitting: Internal Medicine

## 2015-07-24 ENCOUNTER — Ambulatory Visit (INDEPENDENT_AMBULATORY_CARE_PROVIDER_SITE_OTHER): Payer: Medicare Other | Admitting: Internal Medicine

## 2015-07-24 VITALS — BP 130/80 | HR 71 | Temp 97.6°F | Resp 12 | Ht 70.0 in | Wt 174.8 lb

## 2015-07-24 DIAGNOSIS — M810 Age-related osteoporosis without current pathological fracture: Secondary | ICD-10-CM

## 2015-07-24 DIAGNOSIS — N182 Chronic kidney disease, stage 2 (mild): Secondary | ICD-10-CM

## 2015-07-24 DIAGNOSIS — R5383 Other fatigue: Secondary | ICD-10-CM

## 2015-07-24 DIAGNOSIS — J432 Centrilobular emphysema: Secondary | ICD-10-CM

## 2015-07-24 DIAGNOSIS — R972 Elevated prostate specific antigen [PSA]: Secondary | ICD-10-CM

## 2015-07-24 DIAGNOSIS — I10 Essential (primary) hypertension: Secondary | ICD-10-CM

## 2015-07-24 DIAGNOSIS — E559 Vitamin D deficiency, unspecified: Secondary | ICD-10-CM | POA: Diagnosis not present

## 2015-07-24 DIAGNOSIS — Z7289 Other problems related to lifestyle: Secondary | ICD-10-CM

## 2015-07-24 LAB — CBC WITH DIFFERENTIAL/PLATELET
Basophils Absolute: 0 10*3/uL (ref 0.0–0.1)
Basophils Relative: 0.5 % (ref 0.0–3.0)
EOS PCT: 4.2 % (ref 0.0–5.0)
Eosinophils Absolute: 0.2 10*3/uL (ref 0.0–0.7)
HEMATOCRIT: 39.2 % (ref 39.0–52.0)
Hemoglobin: 13.1 g/dL (ref 13.0–17.0)
LYMPHS ABS: 1.3 10*3/uL (ref 0.7–4.0)
Lymphocytes Relative: 24.3 % (ref 12.0–46.0)
MCHC: 33.3 g/dL (ref 30.0–36.0)
MCV: 94.4 fl (ref 78.0–100.0)
MONOS PCT: 11.2 % (ref 3.0–12.0)
Monocytes Absolute: 0.6 10*3/uL (ref 0.1–1.0)
NEUTROS ABS: 3.2 10*3/uL (ref 1.4–7.7)
NEUTROS PCT: 59.8 % (ref 43.0–77.0)
PLATELETS: 216 10*3/uL (ref 150.0–400.0)
RBC: 4.16 Mil/uL — AB (ref 4.22–5.81)
RDW: 14.4 % (ref 11.5–15.5)
WBC: 5.4 10*3/uL (ref 4.0–10.5)

## 2015-07-24 LAB — COMPREHENSIVE METABOLIC PANEL
ALT: 10 U/L (ref 0–53)
AST: 19 U/L (ref 0–37)
Albumin: 3.8 g/dL (ref 3.5–5.2)
Alkaline Phosphatase: 45 U/L (ref 39–117)
BUN: 27 mg/dL — AB (ref 6–23)
CHLORIDE: 107 meq/L (ref 96–112)
CO2: 27 meq/L (ref 19–32)
CREATININE: 1.62 mg/dL — AB (ref 0.40–1.50)
Calcium: 9.3 mg/dL (ref 8.4–10.5)
GFR: 43.06 mL/min — ABNORMAL LOW (ref >60.00)
GLUCOSE: 92 mg/dL (ref 70–99)
Potassium: 3.8 mEq/L (ref 3.5–5.1)
SODIUM: 141 meq/L (ref 135–145)
Total Bilirubin: 0.8 mg/dL (ref 0.2–1.2)
Total Protein: 6.2 g/dL (ref 6.0–8.3)

## 2015-07-24 LAB — LIPID PANEL
Cholesterol: 104 mg/dL (ref 0–200)
HDL: 64.2 mg/dL (ref >39.00)
LDL Cholesterol: 33 mg/dL (ref 0–99)
NonHDL: 39.36
TRIGLYCERIDES: 32 mg/dL (ref 0.0–149.0)
Total CHOL/HDL Ratio: 2
VLDL: 6.4 mg/dL (ref 0.0–40.0)

## 2015-07-24 LAB — TSH: TSH: 1.9 u[IU]/mL (ref 0.35–4.50)

## 2015-07-24 LAB — VITAMIN D 25 HYDROXY (VIT D DEFICIENCY, FRACTURES): VITD: 48.68 ng/mL (ref 30.00–100.00)

## 2015-07-24 NOTE — Progress Notes (Signed)
Pre-visit discussion using our clinic review tool. No additional management support is needed unless otherwise documented below in the visit note.  

## 2015-07-24 NOTE — Patient Instructions (Signed)
  I need your permission to get records from Dr Pryor Ochoa and Dr Broadus John so I can give you my opinion  Eric Hicks's dementia is progressing .  All of the things she is doing or not doing are unfortunate but they are  expected   You would benefit from attending a seminar about dementia for family members.  [employer] has a good one every year.  You can find out about it by calling [employer].

## 2015-07-24 NOTE — Progress Notes (Signed)
Subjective:  Patient ID: Eric Hicks, male    DOB: 1926-05-16  Age: 82 y.o. MRN: [id number]  CC: The primary encounter diagnosis was Osteoporosis. Diagnoses of Essential hypertension, Caregiver with fatigue, Vitamin D deficiency, CKD (chronic Hicks disease) stage 2, GFR 60-89 ml/min, Other fatigue, Other problems related to lifestyle, Centrilobular emphysema ([place]), and Elevated PSA, between 10 and less than 20 ng/ml were also pertinent to this visit.  HPI Eric Hicks presents for follow up on hypertension with CKD, BPH with elevated PSA  and COPD.  Eric Hicks for Urology  Elevated PSA now 21. Watchful waiting recommended by MD.  no records available , so I can't comment on his plan.  Records to be requested  Saw Nephrology last Monday, stable Cr. " No need to follow up with "  Hearing loss despite using hearing ads.  Sees Eric Hicks  For fluid retention behind right TM.Marland Kitchen  Left ear hearing is down. Having hearing tested first   Caregiver fatigue:  Patient caring for wife with dementia .  Wife is getting less active, losing weight,  Not able to leave house without assistance and considerable effort.     Outpatient Prescriptions Prior to Visit  Medication Sig Dispense Refill  . ADVAIR DISKUS 250-50 MCG/DOSE AEPB INHALE 1 PUFF INTO THE LUNGS TWICE A DAY 180 each 1  . alendronate (FOSAMAX) 70 MG tablet TAKE 1 TABLET BY MOUTH EVERY 7 DAYS WITH A FULL GLASS OF WATER 12 tablet 3  . AMBULATORY NON FORMULARY MEDICATION Prostate Formula  With Saw Palmetto 2 tablets daily    . AMBULATORY NON FORMULARY MEDICATION Joint Advantage Gold 2 tablets daily    . calcium carbonate (OS-CAL) 600 MG TABS Take 600 mg by mouth 2 (two) times daily.    Marland Kitchen doxazosin (CARDURA) 4 MG tablet Take 1 tablet (4 mg total) by mouth at bedtime. 90 tablet 2  . Ergocalciferol (VITAMIN D2) 400 UNITS TABS Take 1 tablet by mouth 2 (two) times daily.     . finasteride (PROSCAR) 5 MG tablet Take 5 mg by mouth daily.    .  hydrochlorothiazide (HYDRODIURIL) 25 MG tablet TAKE 1 TABLET BY MOUTH IN THE MORNING 90 tablet 1  . meclizine (ANTIVERT) 25 MG tablet Take 25 mg by mouth as needed.    . meloxicam (MOBIC) 15 MG tablet Take 1 tablet (15 mg total) by mouth daily. (Patient taking differently: Take 15 mg by mouth daily as needed. ) 90 tablet 1  . triamcinolone cream (KENALOG) 0.1 % Apply 1 application topically 2 (two) times daily. 454 g 1   No facility-administered medications prior to visit.    Review of Systems;  Patient denies headache, fevers, malaise, unintentional weight loss, skin rash, eye pain, sinus congestion and sinus pain, sore throat, dysphagia,  hemoptysis , cough, dyspnea, wheezing, chest pain, palpitations, orthopnea, edema, abdominal pain, nausea, melena, diarrhea, constipation, flank pain, dysuria, hematuria, urinary  Frequency, nocturia, numbness, tingling, seizures,  Focal weakness, Loss of consciousness,  Tremor, insomnia, depression, anxiety, and suicidal ideation.      Objective:  BP 130/80 mmHg  Pulse 71  Temp(Src) 97.6 F (36.4 C) (Oral)  Resp 12  Ht '5\' 10"'$  (1.778 m)  Wt 174 lb 12 oz (79.266 kg)  BMI 25.07 kg/m2  SpO2 97%  BP Readings from Last 3 Encounters:  07/24/15 130/80  12/23/14 130/64  04/10/14 122/70    Wt Readings from Last 3 Encounters:  07/24/15 174 lb 12 oz (79.266 kg)  12/23/14  178 lb 8 oz (80.967 kg)  04/10/14 179 lb (81.194 kg)    General appearance: alert, cooperative and appears stated age Ears: normal TM's and external ear canals both ears Throat: lips, mucosa, and tongue normal; teeth and gums normal Neck: no adenopathy, no carotid bruit, supple, symmetrical, trachea midline and thyroid not enlarged, symmetric, no tenderness/mass/nodules Back: symmetric, no curvature. ROM normal. No CVA tenderness. Lungs: clear to auscultation bilaterally Heart: regular rate and rhythm, S1, S2 normal, no murmur, click, rub or gallop Abdomen: soft, non-tender; bowel  sounds normal; no masses,  no organomegaly Pulses: 2+ and symmetric Skin: Skin color, texture, turgor normal. No rashes or lesions Lymph nodes: Cervical, supraclavicular, and axillary nodes normal.  No results found for: HGBA1C  Lab Results  Component Value Date   CREATININE 1.62* 07/24/2015   CREATININE 1.57* 12/23/2014   CREATININE 1.82* 06/20/2014    Lab Results  Component Value Date   WBC 5.4 07/24/2015   HGB 13.1 07/24/2015   HCT 39.2 07/24/2015   PLT 216.0 07/24/2015   GLUCOSE 92 07/24/2015   CHOL 104 07/24/2015   TRIG 32.0 07/24/2015   HDL 64.20 07/24/2015   LDLCALC 33 07/24/2015   ALT 10 07/24/2015   AST 19 07/24/2015   NA 141 07/24/2015   K 3.8 07/24/2015   CL 107 07/24/2015   CREATININE 1.62* 07/24/2015   BUN 27* 07/24/2015   CO2 27 07/24/2015   TSH 1.90 07/24/2015   PSA 13.67* 05/15/2014   MICROALBUR <0.7 06/20/2014     Assessment & Plan:   Problem List Items Addressed This Visit    COPD (chronic obstructive pulmonary disease) with emphysema ([place])    He remains asymptomatic on current medications. No changes today.          Elevated PSA, between 10 and less than 20 ng/ml    Now on Flomax by Dr. Broadus Hicks.  His last PSA was under 20, and he is frustrated and unclear by the plan .  Records requested. Discussed the "watchful waiting" plan and the fact that given his age, no aggressive treatment is advised.          Hypertension    Well controlled on current regimen. Renal function stable, no changes today.  Lab Results  Component Value Date   CREATININE 1.62* 07/24/2015   Lab Results  Component Value Date   NA 141 07/24/2015   K 3.8 07/24/2015   CL 107 07/24/2015   CO2 27 07/24/2015            Relevant Orders   Lipid panel (Completed)   Caregiver with fatigue    Long discussion about his need for support and assistance in caring for wife Eric Hicks who has vascular dementia.          Osteoporosis - Primary    Other Visit Diagnoses      Vitamin D deficiency        Relevant Orders    VITAMIN D 25 Hydroxy (Vit-D Deficiency, Fractures) (Completed)    CKD (chronic Hicks disease) stage 2, GFR 60-89 ml/min        Relevant Orders    Comprehensive metabolic panel (Completed)    CBC with Differential/Platelet (Completed)    Other fatigue        Relevant Orders    TSH (Completed)    Other problems related to lifestyle         A total of 25 minutes of face to face time was spent with patient more than  half of which was spent in counselling about the above mentioned conditions  and coordination of care   I am having Mr. Barefoot maintain his Vitamin D2, calcium carbonate, AMBULATORY NON FORMULARY MEDICATION, AMBULATORY NON FORMULARY MEDICATION, meclizine, triamcinolone cream, finasteride, meloxicam, alendronate, doxazosin, hydrochlorothiazide, and ADVAIR DISKUS.  No orders of the defined types were placed in this encounter.    There are no discontinued medications.  Follow-up: No Follow-up on file.   Crecencio Mc, MD

## 2015-07-27 NOTE — Assessment & Plan Note (Signed)
Now on Flomax by Dr. Broadus John.  His last PSA was under 20, and he is frustrated and unclear by the plan .  Records requested. Discussed the "watchful waiting" plan and the fact that given his age, no aggressive treatment is advised.

## 2015-07-27 NOTE — Assessment & Plan Note (Signed)
Well controlled on current regimen. Renal function stable, no changes today.  Lab Results  Component Value Date   CREATININE 1.62* 07/24/2015   Lab Results  Component Value Date   NA 141 07/24/2015   K 3.8 07/24/2015   CL 107 07/24/2015   CO2 27 07/24/2015

## 2015-07-27 NOTE — Assessment & Plan Note (Signed)
Long discussion about his need for support and assistance in caring for wife Mary who has vascular dementia.   

## 2015-07-27 NOTE — Assessment & Plan Note (Signed)
He remains asymptomatic on current medications. No changes today.   

## 2015-07-29 ENCOUNTER — Encounter: Payer: Self-pay | Admitting: Internal Medicine

## 2015-11-26 DIAGNOSIS — H903 Sensorineural hearing loss, bilateral: Secondary | ICD-10-CM | POA: Diagnosis not present

## 2015-11-26 DIAGNOSIS — H6533 Chronic mucoid otitis media, bilateral: Secondary | ICD-10-CM | POA: Diagnosis not present

## 2015-12-19 ENCOUNTER — Other Ambulatory Visit: Payer: Self-pay | Admitting: Internal Medicine

## 2015-12-24 DIAGNOSIS — N401 Enlarged prostate with lower urinary tract symptoms: Secondary | ICD-10-CM | POA: Diagnosis not present

## 2015-12-24 DIAGNOSIS — R972 Elevated prostate specific antigen [PSA]: Secondary | ICD-10-CM | POA: Diagnosis not present

## 2016-01-14 DIAGNOSIS — R972 Elevated prostate specific antigen [PSA]: Secondary | ICD-10-CM | POA: Diagnosis not present

## 2016-01-14 DIAGNOSIS — C61 Malignant neoplasm of prostate: Secondary | ICD-10-CM | POA: Diagnosis not present

## 2016-02-07 ENCOUNTER — Ambulatory Visit (INDEPENDENT_AMBULATORY_CARE_PROVIDER_SITE_OTHER): Payer: Medicare Other

## 2016-02-07 DIAGNOSIS — Z23 Encounter for immunization: Secondary | ICD-10-CM | POA: Diagnosis not present

## 2016-03-04 DIAGNOSIS — C61 Malignant neoplasm of prostate: Secondary | ICD-10-CM | POA: Diagnosis not present

## 2016-03-04 DIAGNOSIS — N132 Hydronephrosis with renal and ureteral calculous obstruction: Secondary | ICD-10-CM | POA: Diagnosis not present

## 2016-03-04 DIAGNOSIS — K5730 Diverticulosis of large intestine without perforation or abscess without bleeding: Secondary | ICD-10-CM | POA: Diagnosis not present

## 2016-03-04 DIAGNOSIS — N200 Calculus of kidney: Secondary | ICD-10-CM | POA: Diagnosis not present

## 2016-03-04 DIAGNOSIS — M898X8 Other specified disorders of bone, other site: Secondary | ICD-10-CM | POA: Diagnosis not present

## 2016-03-29 ENCOUNTER — Ambulatory Visit (INDEPENDENT_AMBULATORY_CARE_PROVIDER_SITE_OTHER): Payer: Medicare Other | Admitting: Internal Medicine

## 2016-03-29 ENCOUNTER — Encounter: Payer: Self-pay | Admitting: Internal Medicine

## 2016-03-29 VITALS — BP 146/92 | HR 86 | Temp 97.4°F | Resp 12 | Ht 70.0 in | Wt 177.5 lb

## 2016-03-29 DIAGNOSIS — R5383 Other fatigue: Secondary | ICD-10-CM

## 2016-03-29 DIAGNOSIS — Z974 Presence of external hearing-aid: Secondary | ICD-10-CM

## 2016-03-29 DIAGNOSIS — N183 Chronic kidney disease, stage 3 unspecified: Secondary | ICD-10-CM

## 2016-03-29 DIAGNOSIS — F4320 Adjustment disorder, unspecified: Secondary | ICD-10-CM

## 2016-03-29 DIAGNOSIS — E559 Vitamin D deficiency, unspecified: Secondary | ICD-10-CM | POA: Diagnosis not present

## 2016-03-29 DIAGNOSIS — E785 Hyperlipidemia, unspecified: Secondary | ICD-10-CM | POA: Diagnosis not present

## 2016-03-29 DIAGNOSIS — I10 Essential (primary) hypertension: Secondary | ICD-10-CM

## 2016-03-29 DIAGNOSIS — F4321 Adjustment disorder with depressed mood: Secondary | ICD-10-CM

## 2016-03-29 DIAGNOSIS — C61 Malignant neoplasm of prostate: Secondary | ICD-10-CM

## 2016-03-29 DIAGNOSIS — Z9622 Myringotomy tube(s) status: Secondary | ICD-10-CM

## 2016-03-29 DIAGNOSIS — N182 Chronic kidney disease, stage 2 (mild): Secondary | ICD-10-CM

## 2016-03-29 LAB — COMPREHENSIVE METABOLIC PANEL
ALT: 12 U/L (ref 0–53)
AST: 16 U/L (ref 0–37)
Albumin: 3.9 g/dL (ref 3.5–5.2)
Alkaline Phosphatase: 56 U/L (ref 39–117)
BUN: 27 mg/dL — ABNORMAL HIGH (ref 6–23)
CALCIUM: 8.7 mg/dL (ref 8.4–10.5)
CHLORIDE: 107 meq/L (ref 96–112)
CO2: 27 meq/L (ref 19–32)
Creatinine, Ser: 1.63 mg/dL — ABNORMAL HIGH (ref 0.40–1.50)
GFR: 42.69 mL/min — AB (ref >60.00)
GLUCOSE: 92 mg/dL (ref 70–99)
POTASSIUM: 4.1 meq/L (ref 3.5–5.1)
Sodium: 143 mEq/L (ref 135–145)
Total Bilirubin: 0.6 mg/dL (ref 0.2–1.2)
Total Protein: 6.1 g/dL (ref 6.0–8.3)

## 2016-03-29 LAB — CBC WITH DIFFERENTIAL/PLATELET
BASOS ABS: 0.1 10*3/uL (ref 0.0–0.1)
Basophils Relative: 0.7 % (ref 0.0–3.0)
Eosinophils Absolute: 0.2 10*3/uL (ref 0.0–0.7)
Eosinophils Relative: 2.4 % (ref 0.0–5.0)
HEMATOCRIT: 43.2 % (ref 39.0–52.0)
Hemoglobin: 14.3 g/dL (ref 13.0–17.0)
LYMPHS PCT: 20 % (ref 12.0–46.0)
Lymphs Abs: 1.5 10*3/uL (ref 0.7–4.0)
MCHC: 33.1 g/dL (ref 30.0–36.0)
MCV: 95.5 fl (ref 78.0–100.0)
MONOS PCT: 8.8 % (ref 3.0–12.0)
Monocytes Absolute: 0.7 10*3/uL (ref 0.1–1.0)
NEUTROS ABS: 5.1 10*3/uL (ref 1.4–7.7)
Neutrophils Relative %: 68.1 % (ref 43.0–77.0)
Platelets: 230 10*3/uL (ref 150.0–400.0)
RBC: 4.52 Mil/uL (ref 4.22–5.81)
RDW: 14.3 % (ref 11.5–15.5)
WBC: 7.5 10*3/uL (ref 4.0–10.5)

## 2016-03-29 LAB — LIPID PANEL
CHOL/HDL RATIO: 2
Cholesterol: 115 mg/dL (ref 0–200)
HDL: 71.7 mg/dL (ref >39.00)
LDL CALC: 36 mg/dL (ref 0–99)
NonHDL: 43.52
TRIGLYCERIDES: 38 mg/dL (ref 0.0–149.0)
VLDL: 7.6 mg/dL (ref 0.0–40.0)

## 2016-03-29 LAB — TSH: TSH: 1.84 u[IU]/mL (ref 0.35–4.50)

## 2016-03-29 LAB — VITAMIN D 25 HYDROXY (VIT D DEFICIENCY, FRACTURES): VITD: 51.01 ng/mL (ref 30.00–100.00)

## 2016-03-29 NOTE — Progress Notes (Signed)
Pre-visit discussion using our clinic review tool. No additional management support is needed unless otherwise documented below in the visit note.  

## 2016-03-29 NOTE — Patient Instructions (Signed)
You have the option of DOING NOTHING about your prostate CA given your age and the fact that it has not spread    Brachytherapy for Prostate Cancer Brachytherapy for prostate cancer is radiation treatment given from the inside of the prostate instead of from the outside by an external beam. There are two types of brachytherapy:  Low-dose-rate therapy. This involves seed or pellet implantation.  High-dose-rate therapy. This is given by thin tubes that contain radioactive material. When the seed method (also called implant therapy) is used, 80 to 120 little pellets are placed into the prostate and left in place. Because the radiation does not travel far from the prostate, healthy, noncancerous tissues around the prostate receive only a small dose of radiation, which helps protect them from injury. The radiation fades away over the next few months. If the high-dose therapy is used, the thin tubes of radiation are removed after the treatment and there is no radiation left in the prostate when you leave the hospital. This type of treatment is followed by a course of radiation by an external beam method on an outpatient basis. Tell a health care provider about:  Any allergies you have.  All medicines you are taking, including blood thinners, vitamins, herbs, eye drops, creams, and over-the-counter medicines.  Any problems you or family members have had with anesthetic medicines.  Any blood disorders you have.  Any surgeries you have had.  Any medical conditions you have. What are the risks? Generally, brachytherapy for prostate cancer is a safe procedure. However, problems can occur. Possible short-term problems include:  Difficulty passing urine. You may need a catheter for a few days to a month.  Blood in the urine or semen.  A feeling of constipation because of prostate swelling.  Frequent feeling of an urgent need to urinate. Possible long-term problems include:  Inflammation of  the rectum. This happens in about 2% of people who have the procedure.  Erection problems. These vary with age and occur in about 15-40% of men.  Difficulty urinating. This is caused by scarring in the urethra.  Diarrhea. What happens before the procedure? You will be scheduled for some tests, which may include an ultrasound, CT scan, or MRI. These tests do not hurt. A specialist in radiation treatment will meet with you and will decide which type of brachytherapy to use based on these imaging tests. What happens during the procedure?  You will be given a medicine that makes you fall asleep (general anesthetic).  A small probe will be placed in the rectum.  Ultrasound waves will be used to guide the insertion of small tubes into the prostate in preplanned locations.  If the seed method (low-dose) is used:  Small pellets the size of a rice grain will be placed into the tube.  The tube will then be removed, leaving the seeds in the prostate.  If the high-dose method is used:  Radioactive wires will be inserted and left in until a specific dose of radiation has been given.  The wires will then be removed.  In both methods, a catheter is usually placed into the bladder. What happens after the procedure? Follow-up depends on the type of treatment given. With the high-dose treatment, there is no radiation risk after the treatment. Usually additional treatment with external beam radiation will be provided. This will require a series of visits back to the clinic as an outpatient. With the seed implant, some physicians recommend that close contact with children and pregnant  women be limited because of the low dose of radiation still in the prostate. That radiation will be almost gone by 2 months, and by 4-6 months the levels will be almost undetectable. This information is not intended to replace advice given to you by your health care provider. Make sure you discuss any questions you have  with your health care provider. Document Released: 09/20/2005 Document Revised: 09/18/2015 Document Reviewed: 10/03/2012 Elsevier Interactive Patient Education  2017 [employer].

## 2016-03-29 NOTE — Progress Notes (Signed)
Subjective:  Patient ID: Eric Hicks, male    DOB: Sep 04, 1926  Age: 82 y.o. MRN: [id number]  CC: The primary encounter diagnosis was CKD (chronic Hicks disease) stage 3, GFR 30-59 ml/min. Diagnoses of Fatigue, unspecified type, Hyperlipidemia LDL goal <100, Vitamin D deficiency, Prostate cancer ([place]), CKD (chronic Hicks disease), stage II, S/p bilateral myringotomy with tube placement, Hearing aid worn, Essential hypertension, and Grief were also pertinent to this visit.  HPI Eric Hicks presents for follow up on chronic issues  Diagnosed with prostate Ca on August 30  by [name],   Persistently elevated PSA jumped from 17 to 4.  Had biopsies on Sept 20th  that were positive for cancer in 3 specimens.  Nov 9th had PET and CT scan with contrast ,  Both were  Negative for mets. Next appt march 2018.  Patient does not want hormone deprivation therapy,  Wants to have XRT instead based on a friend's advice.  Discussed option of doing nothing given his age.   Has not been seen since beloved wife Eric Hicks died several months ago. Has not lost weight .sleeping fine.  Has created a photographic "walk though her life" which he shared with ne today.    CKD  Stage 3 managed by [name]  . Mild hypernatremia noted,   Myringtomy tubes placed by ENT august 2,, has improved his hearing despite wearing hearing aids  Has not eaten today        Outpatient Medications Prior to Visit  Medication Sig Dispense Refill  . ADVAIR DISKUS 250-50 MCG/DOSE AEPB INHALE 1 PUFF INTO THE LUNGS TWICE DAILY 180 each 1  . alendronate (FOSAMAX) 70 MG tablet TAKE 1 TABLET BY MOUTH EVERY 7 DAYS WITH A FULL GLASS OF WATER 12 tablet 3  . AMBULATORY NON FORMULARY MEDICATION Prostate Formula  With Saw Palmetto 2 tablets daily    . AMBULATORY NON FORMULARY MEDICATION Joint Advantage Gold 2 tablets daily    . calcium carbonate (OS-CAL) 600 MG TABS Take 600 mg by mouth 2 (two) times daily.    Marland Kitchen doxazosin (CARDURA) 4 MG tablet  Take 1 tablet (4 mg total) by mouth at bedtime. 90 tablet 2  . Ergocalciferol (VITAMIN D2) 400 UNITS TABS Take 1 tablet by mouth 2 (two) times daily.     . finasteride (PROSCAR) 5 MG tablet Take 5 mg by mouth daily.    . meclizine (ANTIVERT) 25 MG tablet Take 25 mg by mouth as needed.    . meloxicam (MOBIC) 15 MG tablet Take 1 tablet (15 mg total) by mouth daily. (Patient taking differently: Take 15 mg by mouth daily as needed. ) 90 tablet 1  . triamcinolone cream (KENALOG) 0.1 % Apply 1 application topically 2 (two) times daily. 454 g 1  . hydrochlorothiazide (HYDRODIURIL) 25 MG tablet TAKE 1 TABLET BY MOUTH IN THE MORNING 90 tablet 1   No facility-administered medications prior to visit.     Review of Systems;  Patient denies headache, fevers, malaise, unintentional weight loss, skin rash, eye pain, sinus congestion and sinus pain, sore throat, dysphagia,  hemoptysis , cough, dyspnea, wheezing, chest pain, palpitations, orthopnea, edema, abdominal pain, nausea, melena, diarrhea, constipation, flank pain, dysuria, hematuria, urinary  Frequency, nocturia, numbness, tingling, seizures,  Focal weakness, Loss of consciousness,  Tremor, insomnia, depression, anxiety, and suicidal ideation.      Objective:  BP (!) 146/92   Pulse 86   Temp 97.4 F (36.3 C) (Oral)   Resp 12  Ht '5\' 10"'$  (1.778 m)   Wt 177 lb 8 oz (80.5 kg)   SpO2 97%   BMI 25.47 kg/m   BP Readings from Last 3 Encounters:  03/29/16 (!) 146/92  07/24/15 130/80  12/23/14 130/64    Wt Readings from Last 3 Encounters:  03/29/16 177 lb 8 oz (80.5 kg)  07/24/15 174 lb 12 oz (79.3 kg)  12/23/14 178 lb 8 oz (81 kg)    General appearance: alert, cooperative and appears stated age Ears: normal TM's and external ear canals both ears Throat: lips, mucosa, and tongue normal; teeth and gums normal Neck: no adenopathy, no carotid bruit, supple, symmetrical, trachea midline and thyroid not enlarged, symmetric, no  tenderness/mass/nodules Back: symmetric, no curvature. ROM normal. No CVA tenderness. Lungs: clear to auscultation bilaterally Heart: regular rate and rhythm, S1, S2 normal, no murmur, click, rub or gallop Abdomen: soft, non-tender; bowel sounds normal; no masses,  no organomegaly Pulses: 2+ and symmetric Skin: Skin color, texture, turgor normal. No rashes or lesions Lymph nodes: Cervical, supraclavicular, and axillary nodes normal.  No results found for: HGBA1C  Lab Results  Component Value Date   CREATININE 1.63 (H) 03/29/2016   CREATININE 1.62 (H) 07/24/2015   CREATININE 1.57 (H) 12/23/2014    Lab Results  Component Value Date   WBC 7.5 03/29/2016   HGB 14.3 03/29/2016   HCT 43.2 03/29/2016   PLT 230.0 03/29/2016   GLUCOSE 92 03/29/2016   CHOL 115 03/29/2016   TRIG 38.0 03/29/2016   HDL 71.70 03/29/2016   LDLCALC 36 03/29/2016   ALT 12 03/29/2016   AST 16 03/29/2016   NA 143 03/29/2016   K 4.1 03/29/2016   CL 107 03/29/2016   CREATININE 1.63 (H) 03/29/2016   BUN 27 (H) 03/29/2016   CO2 27 03/29/2016   TSH 1.84 03/29/2016   PSA 13.67 (H) 05/15/2014   MICROALBUR <0.7 06/20/2014    Assessment & Plan:   Problem List Items Addressed This Visit    CKD (chronic Hicks disease), stage II    Renal function has returned to baseline .  He is reminded to avoid daily NSAIDs.  he is on an ARB for control of hypertension,, and   Lab Results  Component Value Date   CREATININE 1.63 (H) 03/29/2016         Grief    Patient is dealing with the  loss of spouse and has adequate coping skills and emotional support .  i have asked patinet to return in one month to examine for signs of unresolving grief.       Hearing aid worn   Hypertension    Not at goal  on current regimen of hctz,  Trial of losartan 25 mg daily . rtc one week for bp and bmet       Relevant Medications   losartan (COZAAR) 25 MG tablet   Prostate cancer ([place])    Confirmed by biopsy x 3.  No gleason  score available.  Discussed treatment options vs doing nothing .      S/p bilateral myringotomy with tube placement    He notes  improved hearing since her received tubes for relief of increased pressure       Other Visit Diagnoses    CKD (chronic Hicks disease) stage 3, GFR 30-59 ml/min    -  Primary   Fatigue, unspecified type       Relevant Orders   Comprehensive metabolic panel (Completed)   CBC with Differential/Platelet (Completed)  TSH (Completed)   Hyperlipidemia LDL goal <100       Relevant Medications   losartan (COZAAR) 25 MG tablet   Other Relevant Orders   Lipid panel (Completed)   Vitamin D deficiency       Relevant Orders   VITAMIN D 25 Hydroxy (Vit-D Deficiency, Fractures) (Completed)      I have discontinued Mr. Pembleton's hydrochlorothiazide. I am also having him start on losartan. Additionally, I am having him maintain his Vitamin D2, calcium carbonate, AMBULATORY NON FORMULARY MEDICATION, AMBULATORY NON FORMULARY MEDICATION, meclizine, triamcinolone cream, finasteride, meloxicam, alendronate, doxazosin, and ADVAIR DISKUS.  Meds ordered this encounter  Medications  . losartan (COZAAR) 25 MG tablet    Sig: Take 1 tablet (25 mg total) by mouth daily.    Dispense:  90 tablet    Refill:  0    Medications Discontinued During This Encounter  Medication Reason  . hydrochlorothiazide (HYDRODIURIL) 25 MG tablet     Follow-up: No Follow-up on file.   Crecencio Mc, MD

## 2016-03-30 ENCOUNTER — Encounter: Payer: Self-pay | Admitting: Internal Medicine

## 2016-03-30 DIAGNOSIS — F4321 Adjustment disorder with depressed mood: Secondary | ICD-10-CM | POA: Insufficient documentation

## 2016-03-30 DIAGNOSIS — Z974 Presence of external hearing-aid: Secondary | ICD-10-CM | POA: Insufficient documentation

## 2016-03-30 DIAGNOSIS — Z9622 Myringotomy tube(s) status: Secondary | ICD-10-CM | POA: Insufficient documentation

## 2016-03-30 DIAGNOSIS — C61 Malignant neoplasm of prostate: Secondary | ICD-10-CM | POA: Insufficient documentation

## 2016-03-30 MED ORDER — LOSARTAN POTASSIUM 25 MG PO TABS
25.0000 mg | ORAL_TABLET | Freq: Every day | ORAL | 0 refills | Status: DC
Start: 2016-03-30 — End: 2016-04-30

## 2016-03-30 NOTE — Assessment & Plan Note (Signed)
Renal function has returned to baseline .  He is reminded to avoid daily NSAIDs.  he is on an ARB for control of hypertension,, and   Lab Results  Component Value Date   CREATININE 1.63 (H) 03/29/2016

## 2016-03-30 NOTE — Assessment & Plan Note (Addendum)
Not at goal  on current regimen of hctz,  Trial of losartan 25 mg daily . rtc one week for bp and bmet

## 2016-03-30 NOTE — Assessment & Plan Note (Signed)
Confirmed by biopsy x 3.  No gleason score available.  Discussed treatment options vs doing nothing .

## 2016-03-30 NOTE — Assessment & Plan Note (Signed)
Patient is dealing with the  loss of spouse and has adequate coping skills and emotional support .  i have asked patinet to return in one month to examine for signs of unresolving grief.

## 2016-03-30 NOTE — Assessment & Plan Note (Addendum)
He notes  improved hearing since her received tubes for relief of increased pressure

## 2016-04-03 ENCOUNTER — Other Ambulatory Visit: Payer: Self-pay | Admitting: Internal Medicine

## 2016-04-03 DIAGNOSIS — I10 Essential (primary) hypertension: Secondary | ICD-10-CM

## 2016-04-03 NOTE — Progress Notes (Signed)
Basic

## 2016-04-20 ENCOUNTER — Ambulatory Visit: Payer: Medicare Other

## 2016-04-20 ENCOUNTER — Other Ambulatory Visit: Payer: Medicare Other

## 2016-04-20 ENCOUNTER — Ambulatory Visit (INDEPENDENT_AMBULATORY_CARE_PROVIDER_SITE_OTHER): Payer: Medicare Other | Admitting: Family Medicine

## 2016-04-20 ENCOUNTER — Encounter: Payer: Self-pay | Admitting: Family Medicine

## 2016-04-20 ENCOUNTER — Other Ambulatory Visit (INDEPENDENT_AMBULATORY_CARE_PROVIDER_SITE_OTHER): Payer: Medicare Other

## 2016-04-20 VITALS — BP 168/97 | HR 67 | Resp 16

## 2016-04-20 DIAGNOSIS — I10 Essential (primary) hypertension: Secondary | ICD-10-CM

## 2016-04-20 DIAGNOSIS — J432 Centrilobular emphysema: Secondary | ICD-10-CM

## 2016-04-20 LAB — BASIC METABOLIC PANEL
BUN: 25 mg/dL — ABNORMAL HIGH (ref 6–23)
CHLORIDE: 108 meq/L (ref 96–112)
CO2: 28 mEq/L (ref 19–32)
CREATININE: 1.52 mg/dL — AB (ref 0.40–1.50)
Calcium: 9.2 mg/dL (ref 8.4–10.5)
GFR: 46.27 mL/min — ABNORMAL LOW (ref >60.00)
Glucose, Bld: 83 mg/dL (ref 70–99)
POTASSIUM: 4 meq/L (ref 3.5–5.1)
SODIUM: 143 meq/L (ref 135–145)

## 2016-04-20 NOTE — Progress Notes (Signed)
Pre visit review using our clinic review tool, if applicable. No additional management support is needed unless otherwise documented below in the visit note. 

## 2016-04-20 NOTE — Progress Notes (Addendum)
  Tommi Rumps, MD Phone: [phone]  Eric Hicks is a 82 y.o. male who presents today for same-day visit.  Patient was seen today by CMA for nurse visit for blood pressure check. Blood pressure noted to be elevated as outlined below. Patient notes he has not checked it since he was started on losartan. He notes no chest pain. He does note he has COPD and has had stable dyspnea with exertion when he extremely exerts himself though none at other times. He notes no edema. He does use his Advair for COPD. He notes no wheezing or cough. Rare shortness of breath with this.   ROS see history of present illness  Objective  Physical Exam Vitals:   04/20/16 1134  BP: (!) 154/90  Pulse: 76  Temp: 98.4 F (36.9 C)    BP Readings from Last 3 Encounters:  04/20/16 (!) 154/90  04/20/16 (!) 168/97  03/29/16 (!) 146/92   Wt Readings from Last 3 Encounters:  04/20/16 177 lb (80.3 kg)  03/29/16 177 lb 8 oz (80.5 kg)  07/24/15 174 lb 12 oz (79.3 kg)    Physical Exam  Constitutional: No distress.  Cardiovascular: Normal rate and regular rhythm.   Pulmonary/Chest: Effort normal and breath sounds normal.  Musculoskeletal: He exhibits no edema.  Neurological: He is alert. Gait normal.  Skin: Skin is warm and dry. He is not diaphoretic.     Assessment/Plan: Please see individual problem list.  Hypertension Not at goal with losartan 25 mg. Discussed awaiting his lab work and if his kidney function remained stable and his electrolytes are good we will increase his losartan to 50 mg once daily and have him return in 1 week for a blood pressure check and repeat lab work.  Update: Patient's lab work returned with slightly improved creatinine and GFR and electrolytes in the normal range. We will increase his losartan 50 mg daily and have him return in 1 week for lab work and blood pressure check.  COPD (chronic obstructive pulmonary disease) with emphysema Patient notes rare dyspnea with  extreme exertion. Reports this is stable and his COPD is stable. Discussed monitoring this and if there is any increased frequency or progression he should be reevaluated. He'll continue his current medications.   Tommi Rumps, MD [place]

## 2016-04-20 NOTE — Patient Instructions (Signed)
Nice to see you. We are going to await your lab work to come back and then determine what medicine to today with your medicines. If you develop chest pain, shortness of breath, swelling, or any new or changing symptoms please see medical attention medially.

## 2016-04-20 NOTE — Assessment & Plan Note (Signed)
Patient notes rare dyspnea with extreme exertion. Reports this is stable and his COPD is stable. Discussed monitoring this and if there is any increased frequency or progression he should be reevaluated. He'll continue his current medications. 

## 2016-04-20 NOTE — Progress Notes (Signed)
Patient seen in the office. Please see visit note.

## 2016-04-20 NOTE — Progress Notes (Signed)
Patient comes in for blood pressure check after starting  Losartan '25mg'$ .   .  Patient states home readings are averaging 110/75.   Patient denies chest pain, nausea, vomiting, no headaches, SOB with exertion,.  He only complains of shortness of breath but using inhalers for COPD.  Per Dr Caryl Bis he needs to be evaluated.

## 2016-04-20 NOTE — Assessment & Plan Note (Addendum)
Not at goal with losartan 25 mg. Discussed awaiting his lab work and if his kidney function remained stable and his electrolytes are good we will increase his losartan to 50 mg once daily and have him return in 1 week for a blood pressure check and repeat lab work.  Update: Patient's lab work returned with slightly improved creatinine and GFR and electrolytes in the normal range. We will increase his losartan 50 mg daily and have him return in 1 week for lab work and blood pressure check.

## 2016-04-21 ENCOUNTER — Encounter: Payer: Self-pay | Admitting: Internal Medicine

## 2016-04-28 ENCOUNTER — Ambulatory Visit (INDEPENDENT_AMBULATORY_CARE_PROVIDER_SITE_OTHER): Payer: Medicare Other

## 2016-04-28 VITALS — BP 134/86 | HR 88 | Resp 16

## 2016-04-28 DIAGNOSIS — I10 Essential (primary) hypertension: Secondary | ICD-10-CM | POA: Diagnosis not present

## 2016-04-28 NOTE — Progress Notes (Addendum)
I have no readings to review,  What folder are you referring to?   Regards,   Deborra Medina, MD

## 2016-04-28 NOTE — Progress Notes (Signed)
Patient comes in for 1 week blood pressure check.   Patient currently taking Losartan 50 mg daily.   Patient brought in a log of blood pressure readings for you to review that are in folder for review.  He did mention that he has had some chest congestion and cough for the past 3-4 days.  He denied shortness of breath.  He has appointment on Friday with you.  Please advise.

## 2016-04-29 NOTE — Progress Notes (Signed)
Placed in the green folder .

## 2016-04-30 ENCOUNTER — Ambulatory Visit: Payer: Medicare Other | Admitting: Internal Medicine

## 2016-04-30 ENCOUNTER — Encounter: Payer: Self-pay | Admitting: Internal Medicine

## 2016-04-30 ENCOUNTER — Ambulatory Visit (INDEPENDENT_AMBULATORY_CARE_PROVIDER_SITE_OTHER): Payer: Medicare Other | Admitting: Internal Medicine

## 2016-04-30 DIAGNOSIS — J441 Chronic obstructive pulmonary disease with (acute) exacerbation: Secondary | ICD-10-CM

## 2016-04-30 MED ORDER — PREDNISONE 10 MG PO TABS: ORAL_TABLET | ORAL | 0 refills | Status: DC

## 2016-04-30 MED ORDER — IPRATROPIUM-ALBUTEROL 0.5-2.5 (3) MG/3ML IN SOLN: 3.0000 mL | Freq: Four times a day (QID) | RESPIRATORY_TRACT | Status: DC

## 2016-04-30 MED ORDER — ALBUTEROL SULFATE (2.5 MG/3ML) 0.083% IN NEBU: 2.5000 mg | INHALATION_SOLUTION | Freq: Four times a day (QID) | RESPIRATORY_TRACT | 1 refills | Status: DC | PRN

## 2016-04-30 MED ORDER — METHYLPREDNISOLONE ACETATE 40 MG/ML IJ SUSP
40.0000 mg | Freq: Once | INTRAMUSCULAR | Status: AC
  Administered 2016-04-30: 40 mg via INTRAMUSCULAR

## 2016-04-30 MED ORDER — DOXYCYCLINE HYCLATE 100 MG PO TABS
100.0000 mg | ORAL_TABLET | Freq: Two times a day (BID) | ORAL | 0 refills | Status: DC
Start: 2016-04-30 — End: 2016-09-27

## 2016-04-30 NOTE — Patient Instructions (Signed)
You are having a COPD exacerbation due to a viral infection.  You are contagious so I would stay away from church congregations, hospitalized people etc for 48 hours.   I will try to treat you at home with the following:  Prednisone taper:  Take :  60 mg  All at once  On Saturday morning , then 50  Mg all at once  on Sunday morning,  continue to taper by 1 tablet daily until gone   Doxycycline 100 mg twice daily  With food  for 7 days  Continue your advair  twice daily  . Use your albuterol nebulizer every 6 hours  as needed for wheezing or chest tightness,   Call us on Monday if you are not  improving

## 2016-04-30 NOTE — Assessment & Plan Note (Addendum)
He is not in respiratory distress. Treating with doxy and prednisone 6 days of of tapering dose.  IM injection of depomedrol given and inhaled therapy with  duoneb given in office.  He notes immediate improvement. He has advair at home but no MDI for  albuterol,  Will order home nebulizer with albuterol  For home use

## 2016-04-30 NOTE — Progress Notes (Signed)
Subjective:  Patient ID: Eric Hicks, male    DOB: 09/17/1926  Age: 83 y.o. MRN: [id number]  CC: The encounter diagnosis was COPD exacerbation ([place]).  HPI Eric Hicks presents for cough and chest congestion started 4 days ago.  The cough is paroxysmal and aggravated lying down,  Not sleeping well.  No fevers,  No body aches, just chhest ache with cough.  . Some sinus congestion worse at night,  Not a lot of rhinitis,    Using Afrin at night.  No sore throat    Early wheezing on exam.  has copd.  Outpatient Medications Prior to Visit  Medication Sig Dispense Refill  . ADVAIR DISKUS 250-50 MCG/DOSE AEPB INHALE 1 PUFF INTO THE LUNGS TWICE DAILY 180 each 1  . alendronate (FOSAMAX) 70 MG tablet TAKE 1 TABLET BY MOUTH EVERY 7 DAYS WITH A FULL GLASS OF WATER 12 tablet 3  . AMBULATORY NON FORMULARY MEDICATION Prostate Formula  With Saw Palmetto 2 tablets daily    . AMBULATORY NON FORMULARY MEDICATION Joint Advantage Gold 2 tablets daily    . calcium carbonate (OS-CAL) 600 MG TABS Take 600 mg by mouth 2 (two) times daily.    Marland Kitchen doxazosin (CARDURA) 4 MG tablet Take 1 tablet (4 mg total) by mouth at bedtime. 90 tablet 2  . Ergocalciferol (VITAMIN D2) 400 UNITS TABS Take 1 tablet by mouth 2 (two) times daily.     . finasteride (PROSCAR) 5 MG tablet Take 5 mg by mouth daily.    . meclizine (ANTIVERT) 25 MG tablet Take 25 mg by mouth as needed.    . meloxicam (MOBIC) 15 MG tablet Take 1 tablet (15 mg total) by mouth daily. (Patient taking differently: Take 15 mg by mouth daily as needed. ) 90 tablet 1  . triamcinolone cream (KENALOG) 0.1 % Apply 1 application topically 2 (two) times daily. 454 g 1  . losartan (COZAAR) 25 MG tablet Take 1 tablet (25 mg total) by mouth daily. 90 tablet 0   No facility-administered medications prior to visit.     Review of Systems;  Patient denies headache, fevers, malaise, unintentional weight loss, skin rash, eye pain, sinus congestion and sinus pain, sore  throat, dysphagia,  hemoptysis , cough, dyspnea, wheezing, chest pain, palpitations, orthopnea, edema, abdominal pain, nausea, melena, diarrhea, constipation, flank pain, dysuria, hematuria, urinary  Frequency, nocturia, numbness, tingling, seizures,  Focal weakness, Loss of consciousness,  Tremor, insomnia, depression, anxiety, and suicidal ideation.      Objective:  BP (!) 146/82   Pulse 79   Temp 97.8 F (36.6 C) (Oral)   Resp 16   Ht '5\' 10"'$  (1.778 m)   Wt 184 lb 2 oz (83.5 kg)   SpO2 95%   BMI 26.42 kg/m   BP Readings from Last 3 Encounters:  04/30/16 (!) 146/82  04/28/16 134/86  04/20/16 (!) 154/90    Wt Readings from Last 3 Encounters:  04/30/16 184 lb 2 oz (83.5 kg)  04/20/16 177 lb (80.3 kg)  03/29/16 177 lb 8 oz (80.5 kg)    General appearance: alert, cooperative and appears stated age Ears: normal TM's and external ear canals both ears Throat: lips, mucosa, and tongue normal; teeth and gums normal Neck: no adenopathy, no carotid bruit, supple, symmetrical, trachea midline and thyroid not enlarged, symmetric, no tenderness/mass/nodules Back: symmetric, no curvature. ROM normal. No CVA tenderness. Lungs: scattered wheezing bilaterally, fair air movement  Heart: regular rate and rhythm, S1, S2 normal, no murmur,  click, rub or gallop Abdomen: soft, non-tender; bowel sounds normal; no masses,  no organomegaly Pulses: 2+ and symmetric Skin: Skin color, texture, turgor normal. No rashes or lesions Lymph nodes: Cervical, supraclavicular, and axillary nodes normal.  No results found for: HGBA1C  Lab Results  Component Value Date   CREATININE 1.52 (H) 04/20/2016   CREATININE 1.63 (H) 03/29/2016   CREATININE 1.62 (H) 07/24/2015    Lab Results  Component Value Date   WBC 7.5 03/29/2016   HGB 14.3 03/29/2016   HCT 43.2 03/29/2016   PLT 230.0 03/29/2016   GLUCOSE 83 04/20/2016   CHOL 115 03/29/2016   TRIG 38.0 03/29/2016   HDL 71.70 03/29/2016   LDLCALC 36  03/29/2016   ALT 12 03/29/2016   AST 16 03/29/2016   NA 143 04/20/2016   K 4.0 04/20/2016   CL 108 04/20/2016   CREATININE 1.52 (H) 04/20/2016   BUN 25 (H) 04/20/2016   CO2 28 04/20/2016   TSH 1.84 03/29/2016   PSA 13.67 (H) 05/15/2014   MICROALBUR <0.7 06/20/2014    Assessment & Plan:   Problem List Items Addressed This Visit    COPD exacerbation ([place])    He is not in respiratory distress. Treating with doxy and prednisone 6 days of of tapering dose.  IM injection of depomedrol given and inhaled therapy with  duoneb given in office.  He notes immediate improvement. He has advair at home but no MDI for  albuterol,  Will order home nebulizer with albuterol  For home use       Relevant Medications   predniSONE (DELTASONE) 10 MG tablet   albuterol (PROVENTIL) (2.5 MG/3ML) 0.083% nebulizer solution   methylPREDNISolone acetate (DEPO-MEDROL) injection 40 mg (Completed) (Start on 04/30/2016 12:15 PM)   ipratropium-albuterol (DUONEB) 0.5-2.5 (3) MG/3ML nebulizer solution 3 mL (Start on 04/30/2016  2:00 PM)      I am having Mr. Snook start on doxycycline, predniSONE, and albuterol. I am also having him maintain his Vitamin D2, calcium carbonate, AMBULATORY NON FORMULARY MEDICATION, AMBULATORY NON FORMULARY MEDICATION, meclizine, triamcinolone cream, finasteride, meloxicam, alendronate, doxazosin, ADVAIR DISKUS, and losartan. We administered methylPREDNISolone acetate. We will continue to administer ipratropium-albuterol.  Meds ordered this encounter  Medications  . losartan (COZAAR) 50 MG tablet    Sig: Take 50 mg by mouth daily.  Marland Kitchen doxycycline (VIBRA-TABS) 100 MG tablet    Sig: Take 1 tablet (100 mg total) by mouth 2 (two) times daily.    Dispense:  14 tablet    Refill:  0  . predniSONE (DELTASONE) 10 MG tablet    Sig: 6 tablets on Day 1 , then reduce by 1 tablet daily until gone    Dispense:  21 tablet    Refill:  0  . albuterol (PROVENTIL) (2.5 MG/3ML) 0.083% nebulizer solution     Sig: Take 3 mLs (2.5 mg total) by nebulization every 6 (six) hours as needed for wheezing or shortness of breath.    Dispense:  150 mL    Refill:  1  . methylPREDNISolone acetate (DEPO-MEDROL) injection 40 mg  . ipratropium-albuterol (DUONEB) 0.5-2.5 (3) MG/3ML nebulizer solution 3 mL    Medications Discontinued During This Encounter  Medication Reason  . losartan (COZAAR) 25 MG tablet Change in therapy    Follow-up: No Follow-up on file.   Crecencio Mc, MD

## 2016-05-05 NOTE — Progress Notes (Signed)
  I have reviewed the above information and agree with above.   Chanz Cahall, MD 

## 2016-05-17 ENCOUNTER — Other Ambulatory Visit: Payer: Self-pay | Admitting: Internal Medicine

## 2016-05-18 ENCOUNTER — Telehealth: Payer: Self-pay

## 2016-05-18 NOTE — Telephone Encounter (Signed)
REFILLED FOR ONE YEAR

## 2016-05-18 NOTE — Telephone Encounter (Signed)
Refilled 06/03/15. Pt last seen 03/29/16. Please advise?

## 2016-05-18 NOTE — Telephone Encounter (Signed)
Lmom for patient to call back to see if they are wanting Nebulizer equipment from [facility].

## 2016-05-19 NOTE — Telephone Encounter (Signed)
Patient state he has been billed 05/15/16  for the Nebulizer equipment. And stating the representative received credit card in 05/14/16.1//22/18  Order is pending. Waiting on call from [place] rep to call in the next 10 day when he should receive his equipment. Please advise

## 2016-05-19 NOTE — Telephone Encounter (Signed)
I  DO NOT UNDERSTAND THE QUESTION.  Please advise about what?

## 2016-05-19 NOTE — Telephone Encounter (Signed)
Sorry I need to add my question at the end...... Would you like me to call the [facility] and find out what is going on with his nebulizer? Patient states he will wait the 10 days, but would not like to use this company in the near future

## 2016-05-19 NOTE — Telephone Encounter (Signed)
I ORDERED THE HOME NEB ON January 5TH, WHEN HE WAS IN THE OFFICE.Eric Hicks  IF [employer] TAKES 2 WEEKS TO FILL AN ORDER, WE WILL NO LONGER USE THEM . Since the delay does seem un reasonable, please call them to find out why

## 2016-05-20 ENCOUNTER — Encounter: Payer: Self-pay | Admitting: Internal Medicine

## 2016-05-21 ENCOUNTER — Other Ambulatory Visit: Payer: Self-pay | Admitting: Internal Medicine

## 2016-05-21 DIAGNOSIS — J441 Chronic obstructive pulmonary disease with (acute) exacerbation: Secondary | ICD-10-CM | POA: Diagnosis not present

## 2016-05-21 MED ORDER — LOSARTAN POTASSIUM 50 MG PO TABS: 50.0000 mg | ORAL_TABLET | Freq: Every day | ORAL | 1 refills | Status: DC

## 2016-05-25 NOTE — Telephone Encounter (Signed)
Order form for Mr. Delong is in the red folder to be completed for neb treatment

## 2016-05-28 DIAGNOSIS — H6983 Other specified disorders of Eustachian tube, bilateral: Secondary | ICD-10-CM | POA: Diagnosis not present

## 2016-06-14 ENCOUNTER — Other Ambulatory Visit: Payer: Self-pay | Admitting: Internal Medicine

## 2016-06-15 ENCOUNTER — Other Ambulatory Visit: Payer: Self-pay | Admitting: Internal Medicine

## 2016-07-02 DIAGNOSIS — C61 Malignant neoplasm of prostate: Secondary | ICD-10-CM | POA: Diagnosis not present

## 2016-07-13 DIAGNOSIS — C61 Malignant neoplasm of prostate: Secondary | ICD-10-CM | POA: Diagnosis not present

## 2016-07-13 DIAGNOSIS — N183 Chronic kidney disease, stage 3 (moderate): Secondary | ICD-10-CM | POA: Diagnosis not present

## 2016-07-13 DIAGNOSIS — N200 Calculus of kidney: Secondary | ICD-10-CM | POA: Diagnosis not present

## 2016-07-13 DIAGNOSIS — I129 Hypertensive chronic kidney disease with stage 1 through stage 4 chronic kidney disease, or unspecified chronic kidney disease: Secondary | ICD-10-CM | POA: Diagnosis not present

## 2016-07-13 DIAGNOSIS — R399 Unspecified symptoms and signs involving the genitourinary system: Secondary | ICD-10-CM | POA: Diagnosis not present

## 2016-07-15 ENCOUNTER — Telehealth: Payer: Self-pay | Admitting: Internal Medicine

## 2016-07-15 NOTE — Telephone Encounter (Signed)
Left pt message asking to call Allison back directly at [phone] to schedule AWV. Thanks! °

## 2016-07-17 ENCOUNTER — Other Ambulatory Visit: Payer: Self-pay | Admitting: Internal Medicine

## 2016-07-23 DIAGNOSIS — C61 Malignant neoplasm of prostate: Secondary | ICD-10-CM | POA: Diagnosis not present

## 2016-07-23 DIAGNOSIS — R972 Elevated prostate specific antigen [PSA]: Secondary | ICD-10-CM | POA: Diagnosis not present

## 2016-08-26 NOTE — Telephone Encounter (Signed)
Scheduled 09/27/16

## 2016-09-27 ENCOUNTER — Ambulatory Visit (INDEPENDENT_AMBULATORY_CARE_PROVIDER_SITE_OTHER): Payer: Medicare Other | Admitting: Internal Medicine

## 2016-09-27 ENCOUNTER — Ambulatory Visit (INDEPENDENT_AMBULATORY_CARE_PROVIDER_SITE_OTHER): Payer: Medicare Other

## 2016-09-27 VITALS — BP 132/64 | HR 62 | Temp 97.9°F | Resp 14 | Ht 70.0 in | Wt 183.8 lb

## 2016-09-27 VITALS — BP 132/64 | HR 62 | Temp 97.9°F | Ht 69.0 in | Wt 183.8 lb

## 2016-09-27 DIAGNOSIS — M810 Age-related osteoporosis without current pathological fracture: Secondary | ICD-10-CM

## 2016-09-27 DIAGNOSIS — M25471 Effusion, right ankle: Secondary | ICD-10-CM | POA: Insufficient documentation

## 2016-09-27 DIAGNOSIS — N183 Chronic kidney disease, stage 3 unspecified: Secondary | ICD-10-CM

## 2016-09-27 DIAGNOSIS — G8929 Other chronic pain: Secondary | ICD-10-CM | POA: Diagnosis not present

## 2016-09-27 DIAGNOSIS — R3914 Feeling of incomplete bladder emptying: Secondary | ICD-10-CM | POA: Diagnosis not present

## 2016-09-27 DIAGNOSIS — N182 Chronic kidney disease, stage 2 (mild): Secondary | ICD-10-CM

## 2016-09-27 DIAGNOSIS — Z0000 Encounter for general adult medical examination without abnormal findings: Secondary | ICD-10-CM | POA: Diagnosis not present

## 2016-09-27 DIAGNOSIS — Z1331 Encounter for screening for depression: Secondary | ICD-10-CM

## 2016-09-27 DIAGNOSIS — N401 Enlarged prostate with lower urinary tract symptoms: Secondary | ICD-10-CM | POA: Diagnosis not present

## 2016-09-27 DIAGNOSIS — C61 Malignant neoplasm of prostate: Secondary | ICD-10-CM

## 2016-09-27 DIAGNOSIS — M25472 Effusion, left ankle: Secondary | ICD-10-CM | POA: Diagnosis not present

## 2016-09-27 DIAGNOSIS — M25551 Pain in right hip: Secondary | ICD-10-CM | POA: Diagnosis not present

## 2016-09-27 LAB — COMPREHENSIVE METABOLIC PANEL
ALBUMIN: 3.8 g/dL (ref 3.5–5.2)
ALT: 10 U/L (ref 0–53)
AST: 16 U/L (ref 0–37)
Alkaline Phosphatase: 60 U/L (ref 39–117)
BUN: 24 mg/dL — AB (ref 6–23)
CHLORIDE: 107 meq/L (ref 96–112)
CO2: 29 mEq/L (ref 19–32)
CREATININE: 1.39 mg/dL (ref 0.40–1.50)
Calcium: 9.3 mg/dL (ref 8.4–10.5)
GFR: 51.24 mL/min — ABNORMAL LOW (ref >60.00)
GLUCOSE: 91 mg/dL (ref 70–99)
POTASSIUM: 4 meq/L (ref 3.5–5.1)
SODIUM: 142 meq/L (ref 135–145)
TOTAL PROTEIN: 6.2 g/dL (ref 6.0–8.3)
Total Bilirubin: 0.6 mg/dL (ref 0.2–1.2)

## 2016-09-27 NOTE — Assessment & Plan Note (Signed)
Managed with weekly alendronate.  Last DEXA 2013.

## 2016-09-27 NOTE — Assessment & Plan Note (Signed)
Stable,  Seen by [facility] annually

## 2016-09-27 NOTE — Assessment & Plan Note (Addendum)
Diagnosed in 2017.  No evidence of mets.  Receiving hormonal deprivation therapy (assume Lupron)

## 2016-09-27 NOTE — Assessment & Plan Note (Signed)
Managed with doxazosin and finasteride.

## 2016-09-27 NOTE — Progress Notes (Signed)
Patient ID: Eric Hicks, male    DOB: May 10, 1926  Age: 83 y.o. MRN: [id number]  The patient is here for annual physical examination and management of other chronic and acute problems.   AWV done with Eric Hicks today Prostate CA under treatment with Eric Hicks  Started hormone deprivation  March 30.  Taking alendronate weekly for osteoporosis   Tolerating it well.   Exercising on a stationery bike several days per week  Bilateral hearing aides and myringotomy tubes by Eric Hicks  Due for eye exam [facility]  Annually [facility] every 6 months   Misses his wife who dies last year,  But his dog is is providing "good company"    The risk factors are reflected in the social history.  The roster of all physicians providing medical care to patient - is listed in the Snapshot section of the chart.  Activities of daily living:  The patient is 100% independent in all ADLs: dressing, toileting, feeding as well as independent mobility  Home safety : The patient has smoke detectors in the home. They wear seatbelts.  There are no firearms at home. There is no violence in the home.   There is no risks for hepatitis, STDs or HIV. There is no   history of blood transfusion. They have no travel history to infectious disease endemic areas of the world.  The patient has seen their dentist in the last six month. They have not seen their eye doctor in the last year. They admit to slight hearing difficulty and he wears hearing aids and has myringotomy tubes   They have deferred audiologic testing in the last year.  They do not  have excessive sun exposure. Discussed the need for sun protection: hats, long sleeves and use of sunscreen if there is significant sun exposure.   Diet: the importance of a healthy diet is discussed. They do have a healthy diet.  The benefits of regular aerobic exercise were discussed. he walks 4 times per week ,  20 minutes.   Depression screen: there are no signs or vegative  symptoms of depression- irritability, change in appetite, anhedonia, sadness/tearfullness.  Cognitive assessment: the patient manages all their financial and personal affairs and is actively engaged. They could relate day,date,year and events; recalled 2/3 objects at 3 minutes; performed clock-face test normally.  The following portions of the patient's history were reviewed and updated as appropriate: allergies, current medications, past family history, past medical history,  past surgical history, past social history  and problem list.  Visual acuity was not assessed per patient preference since she has regular follow up with her ophthalmologist. Hearing and body mass index were assessed and reviewed.   During the course of the visit the patient was educated and counseled about appropriate screening and preventive services including : fall prevention , diabetes screening, nutrition counseling, colorectal cancer screening, and recommended immunizations.    CC: The primary encounter diagnosis was CKD (chronic kidney disease) stage 3, GFR 30-59 ml/min. Diagnoses of Edema of both ankles, Chronic pain of right hip, CKD (chronic kidney disease), stage II, Age-related osteoporosis without current pathological fracture, Prostate cancer (Eric Hicks), and Benign prostatic hyperplasia with incomplete bladder emptying were also pertinent to this visit.  Bilateral trace pitting edema.   History Eric Hicks has a past medical history of BPH (benign prostatic hyperplasia); COPD (chronic obstructive pulmonary disease) ([facility]); Elevated PSA, between 10 and less than 20 ng/ml; Hypertension; and Osteoporosis.   He has a past surgical history that  includes Tympanostomy tube placement (2017).   His family history includes Cancer (age of onset: 37) in his brother; Diabetes in his mother; Hypertension in his father.He reports that he has quit smoking. He has never used smokeless tobacco. He reports that he does not drink alcohol.  His drug history is not on file.  Outpatient Medications Prior to Visit  Medication Sig Dispense Refill  . ADVAIR DISKUS 250-50 MCG/DOSE AEPB INHALE 1 PUFF INTO THE LUNGS TWICE DAILY 180 each 1  . albuterol (PROVENTIL) (2.5 MG/3ML) 0.083% nebulizer solution Take 3 mLs (2.5 mg total) by nebulization every 6 (six) hours as needed for wheezing or shortness of breath. 150 mL 1  . alendronate (FOSAMAX) 70 MG tablet TAKE 1 TABLET BY MOUTH EVERY 7 DAYS WITH A FULL GLASS OF WATER 12 tablet 3  . AMBULATORY NON FORMULARY MEDICATION Prostate Formula  With Saw Palmetto 2 tablets daily    . AMBULATORY NON FORMULARY MEDICATION Joint Advantage Gold 2 tablets daily    . calcium carbonate (OS-CAL) 600 MG TABS Take 600 mg by mouth 2 (two) times daily.    . Ergocalciferol (VITAMIN D2) 400 UNITS TABS Take 1 tablet by mouth 2 (two) times daily.     . finasteride (PROSCAR) 5 MG tablet Take 5 mg by mouth daily.    . meclizine (ANTIVERT) 25 MG tablet Take 25 mg by mouth as needed.    . meloxicam (MOBIC) 15 MG tablet Take 1 tablet (15 mg total) by mouth daily. (Patient taking differently: Take 15 mg by mouth daily as needed. ) 90 tablet 1  . doxycycline (VIBRA-TABS) 100 MG tablet Take 1 tablet (100 mg total) by mouth 2 (two) times daily. (Patient not taking: Reported on 09/27/2016) 14 tablet 0  . losartan (COZAAR) 50 MG tablet Take 1 tablet (50 mg total) by mouth daily. 90 tablet 1   Facility-Administered Medications Prior to Visit  Medication Dose Route Frequency Provider Last Rate Last Dose  . ipratropium-albuterol (DUONEB) 0.5-2.5 (3) MG/3ML nebulizer solution 3 mL  3 mL Nebulization Q6H Eric Mc, MD        Review of Systems   Patient denies headache, fevers, malaise, unintentional weight loss, skin rash, eye pain, sinus congestion and sinus pain, sore throat, dysphagia,  hemoptysis , cough, dyspnea, wheezing, chest pain, palpitations, orthopnea, edema, abdominal pain, nausea, melena, diarrhea,  constipation, flank pain, dysuria, hematuria, urinary  Frequency, nocturia, numbness, tingling, seizures,  Focal weakness, Loss of consciousness,  Tremor, insomnia, depression, anxiety, and suicidal ideation.      Objective:  BP 132/64 (BP Location: Left Arm, Patient Position: Sitting, Cuff Size: Normal)   Pulse 62   Temp 97.9 F (36.6 C) (Oral)   Ht 5\' 9"  (1.753 m)   Wt 183 lb 12.8 oz (83.4 kg)   SpO2 97%   BMI 27.14 kg/m   Physical Exam  General appearance: alert, cooperative and appears stated age Ears: bilateral hearing aids and myringotomy tubes Throat: lips, mucosa, and tongue normal; teeth and gums normal Neck: no adenopathy, no carotid bruit, supple, symmetrical, trachea midline and thyroid not enlarged, symmetric, no tenderness/mass/nodules Back: symmetric, no curvature. ROM normal. No CVA tenderness. Lungs: clear to auscultation bilaterally Heart: regular rate and rhythm, S1, S2 normal, no murmur, click, rub or gallop Abdomen: soft, non-tender; bowel sounds normal; no masses,  no organomegaly Pulses: 2+ and symmetric Skin: Skin color, texture, turgor normal. No rashes or lesions Lymph nodes: Cervical, supraclavicular, and axillary nodes normal. Psych: affect normal, makes good  eye contact. No fidgeting,  Smiles easily.  Denies suicidal thoughts    Assessment & Plan:   Problem List Items Addressed This Visit    Prostate cancer ([place])    Diagnosed in 2017.  No evidence of mets.  Receiving hormonal deprivation therapy (assume Lupron)       Osteoporosis    Managed with weekly alendronate.  Last DEXA 2013.        Edema of both ankles    .  Adding hctz 12.5 mg daily       CKD (chronic kidney disease), stage II    Stable,  Seen by [facility] annually      Chronic hip pain    Intermittent, managed with prn use of advil.  Advised to avoid daily use given CKd,  Has tylenol allergy (rash)       BPH (benign prostatic hyperplasia)    Managed with doxazosin and  finasteride.        Other Visit Diagnoses    CKD (chronic kidney disease) stage 3, GFR 30-59 ml/min    -  Primary   Relevant Orders   Comprehensive metabolic panel      I have discontinued Mr. Elson's doxycycline and losartan. I am also having him maintain his Vitamin D2, calcium carbonate, AMBULATORY NON FORMULARY MEDICATION, AMBULATORY NON FORMULARY MEDICATION, meclizine, finasteride, meloxicam, albuterol, alendronate, ADVAIR DISKUS, and doxazosin. We will continue to administer ipratropium-albuterol.  Meds ordered this encounter  Medications  . doxazosin (CARDURA) 4 MG tablet    Sig: TAKE 1 TABLET (4 MG TOTAL) BY MOUTH AT BEDTIME.    Refill:  1    Medications Discontinued During This Encounter  Medication Reason  . doxycycline (VIBRA-TABS) 100 MG tablet Therapy completed  . losartan (COZAAR) 50 MG tablet     Follow-up: No Follow-up on file.   Eric Mc, MD

## 2016-09-27 NOTE — Patient Instructions (Addendum)
  Mr. Zepeda , Thank you for taking time to come for your Medicare Wellness Visit. I appreciate your ongoing commitment to your health goals. Please review the following plan we discussed and let me know if I can assist you in the future.   Follow up with Dr. Derrel Nip as needed.    Have a great day!  These are the goals we discussed: Goals    . Healthy Lifestyle          Low carb foods Stay active Stay hydrated       This is a list of the screening recommended for you and due dates:  Health Maintenance  Topic Date Due  . Flu Shot  11/24/2016  . Tetanus Vaccine  06/13/2022  . Pneumonia vaccines  Completed

## 2016-09-27 NOTE — Assessment & Plan Note (Signed)
Intermittent, managed with prn use of advil.  Advised to avoid daily use given CKd,  Has tylenol allergy (rash)

## 2016-09-27 NOTE — Patient Instructions (Addendum)
Resume hctz at half tablet (12.5 mg ) in the morning,  Along with losartan.  It is a fluid pill and will help your fluid retention  Let me know when you run out of either and I will combine them into one pill for convenience.   [email or web address] has a great selection of compression knee highs,  including sweat socks, that you can use if needed    Health Maintenance, Male A healthy lifestyle and preventive care is important for your health and wellness. Ask your health care provider about what schedule of regular examinations is right for you. What should I know about weight and diet? Eat a Healthy Diet  Eat plenty of vegetables, fruits, whole grains, low-fat dairy products, and lean protein.  Do not eat a lot of foods high in solid fats, added sugars, or salt.  Maintain a Healthy Weight Regular exercise can help you achieve or maintain a healthy weight. You should:  Do at least 150 minutes of exercise each week. The exercise should increase your heart rate and make you sweat (moderate-intensity exercise).  Do strength-training exercises at least twice a week.  Watch Your Levels of Cholesterol and Blood Lipids  Have your blood tested for lipids and cholesterol every 5 years starting at 81 years of age. If you are at high risk for heart disease, you should start having your blood tested when you are 81 years old. You may need to have your cholesterol levels checked more often if: ? Your lipid or cholesterol levels are high. ? You are older than 81 years of age. ? You are at high risk for heart disease.  What should I know about cancer screening? Many types of cancers can be detected early and may often be prevented. Lung Cancer  You should be screened every year for lung cancer if: ? You are a current smoker who has smoked for at least 30 years. ? You are a former smoker who has quit within the past 15 years.  Talk to your health care provider about your screening options, when you  should start screening, and how often you should be screened.  Colorectal Cancer  Routine colorectal cancer screening usually begins at 81 years of age and should be repeated every 5-10 years until you are 81 years old. You may need to be screened more often if early forms of precancerous polyps or small growths are found. Your health care provider may recommend screening at an earlier age if you have risk factors for colon cancer.  Your health care provider may recommend using home test kits to check for hidden blood in the stool.  A small camera at the end of a tube can be used to examine your colon (sigmoidoscopy or colonoscopy). This checks for the earliest forms of colorectal cancer.  Prostate and Testicular Cancer  Depending on your age and overall health, your health care provider may do certain tests to screen for prostate and testicular cancer.  Talk to your health care provider about any symptoms or concerns you have about testicular or prostate cancer.  Skin Cancer  Check your skin from head to toe regularly.  Tell your health care provider about any new moles or changes in moles, especially if: ? There is a change in a mole's size, shape, or color. ? You have a mole that is larger than a pencil eraser.  Always use sunscreen. Apply sunscreen liberally and repeat throughout the day.  Protect yourself by wearing long sleeves, pants,  a wide-brimmed hat, and sunglasses when outside.  What should I know about heart disease, diabetes, and high blood pressure?  If you are 20-50 years of age, have your blood pressure checked every 3-5 years. If you are 51 years of age or older, have your blood pressure checked every year. You should have your blood pressure measured twice-once when you are at a hospital or clinic, and once when you are not at a hospital or clinic. Record the average of the two measurements. To check your blood pressure when you are not at a hospital or clinic, you  can use: ? An automated blood pressure machine at a pharmacy. ? A home blood pressure monitor.  Talk to your health care provider about your target blood pressure.  If you are between 13-59 years old, ask your health care provider if you should take aspirin to prevent heart disease.  Have regular diabetes screenings by checking your fasting blood sugar level. ? If you are at a normal weight and have a low risk for diabetes, have this test once every three years after the age of 67. ? If you are overweight and have a high risk for diabetes, consider being tested at a younger age or more often.  A one-time screening for abdominal aortic aneurysm (AAA) by ultrasound is recommended for men aged 57-75 years who are current or former smokers. What should I know about preventing infection? Hepatitis B If you have a higher risk for hepatitis B, you should be screened for this virus. Talk with your health care provider to find out if you are at risk for hepatitis B infection. Hepatitis C Blood testing is recommended for:  Everyone born from [birth year] through 1963.  Anyone with known risk factors for hepatitis C.  Sexually Transmitted Diseases (STDs)  You should be screened each year for STDs including gonorrhea and chlamydia if: ? You are sexually active and are younger than 81 years of age. ? You are older than 81 years of age and your health care provider tells you that you are at risk for this type of infection. ? Your sexual activity has changed since you were last screened and you are at an increased risk for chlamydia or gonorrhea. Ask your health care provider if you are at risk.  Talk with your health care provider about whether you are at high risk of being infected with HIV. Your health care provider may recommend a prescription medicine to help prevent HIV infection.  What else can I do?  Schedule regular health, dental, and eye exams.  Stay current with your vaccines  (immunizations).  Do not use any tobacco products, such as cigarettes, chewing tobacco, and e-cigarettes. If you need help quitting, ask your health care provider.  Limit alcohol intake to no more than 2 drinks per day. One drink equals 12 ounces of beer, 5 ounces of wine, or 1 ounces of hard liquor.  Do not use street drugs.  Do not share needles.  Ask your health care provider for help if you need support or information about quitting drugs.  Tell your health care provider if you often feel depressed.  Tell your health care provider if you have ever been abused or do not feel safe at home. This information is not intended to replace advice given to you by your health care provider. Make sure you discuss any questions you have with your health care provider. Document Released: 10/09/2007 Document Revised: 12/10/2015 Document Reviewed: 01/14/2015 Elsevier Interactive  Patient Education  [employer].

## 2016-09-27 NOTE — Assessment & Plan Note (Signed)
.    Adding hctz 12.5 mg daily

## 2016-09-27 NOTE — Progress Notes (Signed)
Subjective:   Eric Hicks is a 83 y.o. male who presents for Medicare Annual/Subsequent preventive examination.  Review of Systems:  No ROS.  Medicare Wellness Visit.  Cardiac Risk Factors include: hypertension;advanced age (>50mn, >>98women);male gender     Objective:    Vitals: BP 132/64 (BP Location: Left Arm, Patient Position: Sitting, Cuff Size: Normal)   Pulse 62   Temp 97.9 F (36.6 C) (Oral)   Resp 14   Ht 5' 10"  (1.778 m)   Wt 183 lb 12.8 oz (83.4 kg)   SpO2 97%   BMI 26.37 kg/m   Body mass index is 26.37 kg/m.  Tobacco History  Smoking Status  . Former Smoker  Smokeless Tobacco  . Never Used     Counseling given: No   Past Medical History:  Diagnosis Date  . BPH (benign prostatic hyperplasia)    managed by DOlena Heckle . [place]   . Elevated PSA, between 10 and less than 20 ng/ml    DOlena Heckle watchful waiting  . Hypertension   . Osteoporosis    by DEXA   Past Surgical History:  Procedure Laterality Date  . TYMPANOSTOMY TUBE PLACEMENT  2017   Family History  Problem Relation Age of Onset  . Diabetes Mother   . Hypertension Father   . Cancer Brother [id number]      multiple myeloma   History  Sexual Activity  . Sexual activity: No    Outpatient Encounter Prescriptions as of 09/27/2016  Medication Sig  . ADVAIR DISKUS 250-50 MCG/DOSE AEPB INHALE 1 PUFF INTO THE LUNGS TWICE DAILY  . albuterol (PROVENTIL) (2.5 MG/3ML) 0.083% nebulizer solution Take 3 mLs (2.5 mg total) by nebulization every 6 (six) hours as needed for wheezing or shortness of breath.  .Marland Kitchenalendronate (FOSAMAX) 70 MG tablet TAKE 1 TABLET BY MOUTH EVERY 7 DAYS WITH A FULL GLASS OF WATER  . AMBULATORY NON FORMULARY MEDICATION Prostate Formula  With Saw Palmetto 2 tablets daily  . AMBULATORY NON FORMULARY MEDICATION Joint Advantage Gold 2 tablets daily  . calcium carbonate (OS-CAL) 600 MG TABS Take 600 mg by mouth 2 (two) times daily.  .Marland Kitchen doxycycline (VIBRA-TABS) 100 MG tablet Take 1 tablet (100 mg total) by mouth 2 (two) times daily.  . Ergocalciferol (VITAMIN D2) 400 UNITS TABS Take 1 tablet by mouth 2 (two) times daily.   . finasteride (PROSCAR) 5 MG tablet Take 5 mg by mouth daily.  .Marland Kitchenlosartan (COZAAR) 50 MG tablet Take 1 tablet (50 mg total) by mouth daily.  . meclizine (ANTIVERT) 25 MG tablet Take 25 mg by mouth as needed.  . meloxicam (MOBIC) 15 MG tablet Take 1 tablet (15 mg total) by mouth daily. (Patient taking differently: Take 15 mg by mouth daily as needed. )  . [DISCONTINUED] doxazosin (CARDURA) 4 MG tablet TAKE 1 TABLET (4 MG TOTAL) BY MOUTH AT BEDTIME.  . [DISCONTINUED] predniSONE (DELTASONE) 10 MG tablet 6 tablets on Day 1 , then reduce by 1 tablet daily until gone  . [DISCONTINUED] triamcinolone cream (KENALOG) 0.1 % Apply 1 application topically 2 (two) times daily.   Facility-Administered Encounter Medications as of 09/27/2016  Medication  . ipratropium-albuterol (DUONEB) 0.5-2.5 (3) MG/3ML nebulizer solution 3 mL    Activities of Daily Living In your present state of health, do you have any difficulty performing the following activities: 09/27/2016  Hearing? Y  Vision? N  Difficulty concentrating or making decisions? N  Walking or climbing  stairs? N  Dressing or bathing? N  Doing errands, shopping? N  Preparing Food and eating ? N  Using the Toilet? N  In the past six months, have you accidently leaked urine? N  Do you have problems with loss of bowel control? N  Managing your Medications? N  Managing your Finances? N  Housekeeping or managing your Housekeeping? N  Some recent data might be hidden    Patient Care Team: Crecencio Mc, MD as PCP - General (Internal Medicine)   Assessment:    This is a routine wellness examination for Eric Hicks. The goal of the wellness visit is to assist the patient how to close the gaps in care and create a preventative care plan for the patient.   Taking  calcium VIT D as appropriate/Osteoporosis reviewed.  Medications reviewed; taking without issues or barriers.  Safety issues reviewed; smoke detectors in the home.Firearms locked up in the home. Wears seatbelts when driving or riding with others. Patient does wear sunscreen or protective clothing when in direct sunlight. No violence in the home.  Depression- PHQ 2 &9 complete.  No signs/symptoms or verbal communication regarding little pleasure in doing things, feeling down, depressed or hopeless. No changes in sleeping, energy, eating, concentrating.  No thoughts of self harm or harm towards others.  Time spent on this topic is 8 minutes.   Patient is alert, normal appearance, oriented to person/place/and time. Correctly identified the president of the [place], recall of 3/3 words, and performing simple calculations.  Patient displays appropriate judgement and can read correct time from watch face.  No new identified risk were noted.  No failures at ADL's or IADL's.   BMI- discussed the importance of a healthy diet, water intake and exercise. Educational material provided.   Diet: Breakfast: Oatmeal, almond milk, fruit cocktail Lunch: Fish, sweet potato Dinner: Deli sandwich, chips Daily fluid intake: 1 cups of caffeine, 3 cups of water  HTN- followed by PCP.  Dental- every six months.  Eye- Visual acuity not assessed per patient preference since they have regular follow up with the ophthalmologist.  Wears corrective lenses.  Sleep patterns- Sleeps 7 hours at night.  Wakes feeling rested.    Health maintenance gaps- closed.  Patient Concerns: None at this time. Follow up with PCP as needed.  Exercise Activities and Dietary recommendations Current Exercise Habits: Home exercise routine, Type of exercise: calisthenics, Time (Minutes): 15, Frequency (Times/Week): 3, Weekly Exercise (Minutes/Week): 45, Intensity: Mild  Goals    . Healthy Lifestyle          Low carb foods Stay  active Stay hydrated      Fall Risk Fall Risk  09/27/2016 07/24/2015 04/10/2014  Falls in the past year? No No No   Depression Screen PHQ 2/9 Scores 09/27/2016 07/24/2015 04/10/2014  PHQ - 2 Score 0 0 0  PHQ- 9 Score 0 - -    Cognitive Function MMSE - Mini Mental State Exam 09/27/2016  Orientation to time 5  Orientation to Place 5  Registration 3  Attention/ Calculation 5  Recall 3  Language- name 2 objects 2  Language- repeat 1  Language- follow 3 step command 3  Language- read & follow direction 1  Write a sentence 1  Copy design 1  Total score 30        Immunization History  Administered Date(s) Administered  . Influenza, High Dose Seasonal PF 02/07/2016  . Influenza-Unspecified 02/26/2011, 02/13/2014, 02/12/2015  . Pneumococcal Conjugate-13 11/20/2013  . Pneumococcal Polysaccharide-23  11/11/1999, 11/11/2006  . Tdap 06/13/2012  . Zoster 02/13/2014   Screening Tests Health Maintenance  Topic Date Due  . INFLUENZA VACCINE  11/24/2016  . TETANUS/TDAP  06/13/2022  . PNA vac Low Risk Adult  Completed      Plan:    End of life planning; Advance aging; Advanced directives discussed. Copy of current HCPOA/Living Will on file.    I have personally reviewed and noted the following in the patient's chart:   . Medical and social history . Use of alcohol, tobacco or illicit drugs  . Current medications and supplements . Functional ability and status . Nutritional status . Physical activity . Advanced directives . List of other physicians . Hospitalizations, surgeries, and ER visits in previous 12 months . Vitals . Screenings to include cognitive, depression, and falls . Referrals and appointments  In addition, I have reviewed and discussed with patient certain preventive protocols, quality metrics, and best practice recommendations. A written personalized care plan for preventive services as well as general preventive health recommendations were provided to  patient.     OBrien-Blaney, Mendel Binsfeld L, LPN  1/0/2548   I have reviewed the above information and agree with above.   Deborra Medina, MD

## 2016-09-29 ENCOUNTER — Encounter: Payer: Self-pay | Admitting: Internal Medicine

## 2016-10-18 DIAGNOSIS — L570 Actinic keratosis: Secondary | ICD-10-CM | POA: Diagnosis not present

## 2016-10-18 DIAGNOSIS — L821 Other seborrheic keratosis: Secondary | ICD-10-CM | POA: Diagnosis not present

## 2016-11-14 ENCOUNTER — Other Ambulatory Visit: Payer: Self-pay | Admitting: Internal Medicine

## 2016-11-16 ENCOUNTER — Other Ambulatory Visit: Payer: Self-pay | Admitting: Internal Medicine

## 2016-11-26 DIAGNOSIS — H903 Sensorineural hearing loss, bilateral: Secondary | ICD-10-CM | POA: Diagnosis not present

## 2016-11-26 DIAGNOSIS — H6981 Other specified disorders of Eustachian tube, right ear: Secondary | ICD-10-CM | POA: Diagnosis not present

## 2016-12-06 ENCOUNTER — Other Ambulatory Visit: Payer: Self-pay | Admitting: Internal Medicine

## 2016-12-26 ENCOUNTER — Other Ambulatory Visit: Payer: Self-pay | Admitting: Internal Medicine

## 2017-01-19 NOTE — Telephone Encounter (Signed)
Orders

## 2017-01-27 DIAGNOSIS — H40003 Preglaucoma, unspecified, bilateral: Secondary | ICD-10-CM | POA: Diagnosis not present

## 2017-01-28 DIAGNOSIS — R232 Flushing: Secondary | ICD-10-CM | POA: Diagnosis not present

## 2017-01-28 DIAGNOSIS — C61 Malignant neoplasm of prostate: Secondary | ICD-10-CM | POA: Diagnosis not present

## 2017-02-09 ENCOUNTER — Ambulatory Visit (INDEPENDENT_AMBULATORY_CARE_PROVIDER_SITE_OTHER): Payer: Medicare Other | Admitting: *Deleted

## 2017-02-09 DIAGNOSIS — Z23 Encounter for immunization: Secondary | ICD-10-CM

## 2017-02-24 DIAGNOSIS — H401112 Primary open-angle glaucoma, right eye, moderate stage: Secondary | ICD-10-CM | POA: Diagnosis not present

## 2017-03-28 DIAGNOSIS — H401112 Primary open-angle glaucoma, right eye, moderate stage: Secondary | ICD-10-CM | POA: Diagnosis not present

## 2017-03-30 ENCOUNTER — Encounter: Payer: Self-pay | Admitting: Internal Medicine

## 2017-03-30 ENCOUNTER — Ambulatory Visit (INDEPENDENT_AMBULATORY_CARE_PROVIDER_SITE_OTHER): Payer: Medicare Other | Admitting: Internal Medicine

## 2017-03-30 VITALS — BP 140/84 | HR 57 | Temp 97.3°F | Resp 15 | Ht 69.0 in | Wt 183.0 lb

## 2017-03-30 DIAGNOSIS — M810 Age-related osteoporosis without current pathological fracture: Secondary | ICD-10-CM | POA: Diagnosis not present

## 2017-03-30 DIAGNOSIS — N182 Chronic kidney disease, stage 2 (mild): Secondary | ICD-10-CM

## 2017-03-30 DIAGNOSIS — R5383 Other fatigue: Secondary | ICD-10-CM

## 2017-03-30 DIAGNOSIS — E559 Vitamin D deficiency, unspecified: Secondary | ICD-10-CM | POA: Diagnosis not present

## 2017-03-30 DIAGNOSIS — I10 Essential (primary) hypertension: Secondary | ICD-10-CM

## 2017-03-30 DIAGNOSIS — C61 Malignant neoplasm of prostate: Secondary | ICD-10-CM

## 2017-03-30 DIAGNOSIS — E7800 Pure hypercholesterolemia, unspecified: Secondary | ICD-10-CM | POA: Diagnosis not present

## 2017-03-30 LAB — COMPREHENSIVE METABOLIC PANEL
ALK PHOS: 50 U/L (ref 39–117)
ALT: 9 U/L (ref 0–53)
AST: 16 U/L (ref 0–37)
Albumin: 3.9 g/dL (ref 3.5–5.2)
BILIRUBIN TOTAL: 1 mg/dL (ref 0.2–1.2)
BUN: 24 mg/dL — ABNORMAL HIGH (ref 6–23)
CALCIUM: 9.4 mg/dL (ref 8.4–10.5)
CO2: 29 mEq/L (ref 19–32)
Chloride: 104 mEq/L (ref 96–112)
Creatinine, Ser: 1.44 mg/dL (ref 0.40–1.50)
GFR: 49.14 mL/min — AB (ref >60.00)
Glucose, Bld: 90 mg/dL (ref 70–99)
Potassium: 4.2 mEq/L (ref 3.5–5.1)
Sodium: 141 mEq/L (ref 135–145)
TOTAL PROTEIN: 6.3 g/dL (ref 6.0–8.3)

## 2017-03-30 LAB — CBC WITH DIFFERENTIAL/PLATELET
BASOS ABS: 0 10*3/uL (ref 0.0–0.1)
Basophils Relative: 0.6 % (ref 0.0–3.0)
Eosinophils Absolute: 0.2 10*3/uL (ref 0.0–0.7)
Eosinophils Relative: 2.9 % (ref 0.0–5.0)
HEMATOCRIT: 39.6 % (ref 39.0–52.0)
HEMOGLOBIN: 13.1 g/dL (ref 13.0–17.0)
LYMPHS PCT: 21.2 % (ref 12.0–46.0)
Lymphs Abs: 1.3 10*3/uL (ref 0.7–4.0)
MCHC: 33 g/dL (ref 30.0–36.0)
MCV: 98.1 fl (ref 78.0–100.0)
MONOS PCT: 11.8 % (ref 3.0–12.0)
Monocytes Absolute: 0.7 10*3/uL (ref 0.1–1.0)
NEUTROS ABS: 3.9 10*3/uL (ref 1.4–7.7)
Neutrophils Relative %: 63.5 % (ref 43.0–77.0)
Platelets: 201 10*3/uL (ref 150.0–400.0)
RBC: 4.03 Mil/uL — AB (ref 4.22–5.81)
RDW: 13 % (ref 11.5–15.5)
WBC: 6.2 10*3/uL (ref 4.0–10.5)

## 2017-03-30 LAB — LIPID PANEL
Cholesterol: 109 mg/dL (ref 0–200)
HDL: 65.8 mg/dL (ref >39.00)
LDL Cholesterol: 31 mg/dL (ref 0–99)
NONHDL: 43.47
Total CHOL/HDL Ratio: 2
Triglycerides: 63 mg/dL (ref 0.0–149.0)
VLDL: 12.6 mg/dL (ref 0.0–40.0)

## 2017-03-30 LAB — VITAMIN D 25 HYDROXY (VIT D DEFICIENCY, FRACTURES): VITD: 49.89 ng/mL (ref 30.00–100.00)

## 2017-03-30 LAB — TSH: TSH: 1.88 u[IU]/mL (ref 0.35–4.50)

## 2017-03-30 MED ORDER — LOSARTAN POTASSIUM-HCTZ 50-12.5 MG PO TABS: 1.0000 | ORAL_TABLET | Freq: Every day | ORAL | 3 refills | Status: DC

## 2017-03-30 MED ORDER — DOXAZOSIN MESYLATE 4 MG PO TABS: 4.0000 mg | ORAL_TABLET | Freq: Every day | ORAL | 1 refills | Status: DC

## 2017-03-30 MED ORDER — ZOSTER VAC RECOMB ADJUVANTED 50 MCG/0.5ML IM SUSR: 0.5000 mL | Freq: Once | INTRAMUSCULAR | 1 refills | Status: AC

## 2017-03-30 NOTE — Progress Notes (Signed)
Subjective:  Patient ID: Eric Hicks, male    DOB: January 26, 1927  Age: 83 y.o. MRN: [id number]  CC: The primary encounter diagnosis was Vitamin D deficiency. Diagnoses of CKD (chronic kidney disease), stage II, Fatigue, unspecified type, Pure hypercholesterolemia, Essential hypertension, Prostate cancer ([place]), and Age-related osteoporosis without current pathological fracture were also pertinent to this visit.  HPI Vence Lalor Catalfamo presents for FOLLOW UP ON CKD, COPD .   1) lost his front tooth eating pizza Monday night,  Broke off at the root.  Had to have root extracted   2) seeing [place] for glaucoma right eye, used  drops for a month and repeat pressure was lower by   pts , now on combigan eye drops,pressurehas normalized  3) Prostate CA monitoring PSA [facility] in [place].  nad his 2nd  dose of Lupron in October and PSA dropped to zero   4) HA's replaced  Had myringotomy tubes in the past . Hearing is more clear  5)  CKD:  GFR better in June.   [facility]rology [facility] use tylenol due to rash.  Uses aleve very sparingly    6) htn: needs refill on medications   6) Advair costing hi $151 and pushing him into donut hole. Marland Kitchen Has not tried using the app [facility] .  Has a home nebulizer but he has used only twice  Outpatient Medications Prior to Visit  Medication Sig Dispense Refill  . ADVAIR DISKUS 250-50 MCG/DOSE AEPB INHALE 1 PUFF INTO THE LUNGS TWICE DAILY 180 each 1  . albuterol (PROVENTIL) (2.5 MG/3ML) 0.083% nebulizer solution Take 3 mLs (2.5 mg total) by nebulization every 6 (six) hours as needed for wheezing or shortness of breath. 150 mL 1  . alendronate (FOSAMAX) 70 MG tablet TAKE 1 TABLET BY MOUTH EVERY 7 DAYS WITH A FULL GLASS OF WATER 12 tablet 3  . AMBULATORY NON FORMULARY MEDICATION Joint Advantage Gold 2 tablets daily    . calcium carbonate (OS-CAL) 600 MG TABS Take 600 mg by mouth 2 (two) times daily.    . Ergocalciferol (VITAMIN D2) 400 UNITS TABS Take 1  tablet by mouth 2 (two) times daily.     . finasteride (PROSCAR) 5 MG tablet Take 5 mg by mouth daily.    . meloxicam (MOBIC) 15 MG tablet Take 1 tablet (15 mg total) by mouth daily. (Patient taking differently: Take 15 mg by mouth daily as needed. ) 90 tablet 1  . doxazosin (CARDURA) 4 MG tablet TAKE 1 TABLET (4 MG TOTAL) BY MOUTH AT BEDTIME. 90 tablet 1  . hydrochlorothiazide (MICROZIDE) 12.5 MG capsule Take 12.5 mg by mouth daily.    Marland Kitchen losartan (COZAAR) 50 MG tablet TAKE 1 TABLET (50 MG TOTAL) BY MOUTH DAILY. 90 tablet 1  . meclizine (ANTIVERT) 25 MG tablet Take 25 mg by mouth as needed.    . timolol (TIMOPTIC) 0.5 % ophthalmic solution TAKE 1 DROP(S) IN RIGHT EYE 2 TIMES A DAY  5  . AMBULATORY NON FORMULARY MEDICATION Prostate Formula  With Saw Palmetto 2 tablets daily    . doxazosin (CARDURA) 4 MG tablet TAKE 1 TABLET (4 MG TOTAL) BY MOUTH AT BEDTIME.  1   Facility-Administered Medications Prior to Visit  Medication Dose Route Frequency Provider Last Rate Last Dose  . ipratropium-albuterol (DUONEB) 0.5-2.5 (3) MG/3ML nebulizer solution 3 mL  3 mL Nebulization Q6H Crecencio Mc, MD        Review of Systems;  Patient  denies headache, fevers, malaise, unintentional weight loss, skin rash, eye pain, sinus congestion and sinus pain, sore throat, dysphagia,  hemoptysis , cough, dyspnea, wheezing, chest pain, palpitations, orthopnea, edema, abdominal pain, nausea, melena, diarrhea, constipation, flank pain, dysuria, hematuria, urinary  Frequency, nocturia, numbness, tingling, seizures,  Focal weakness, Loss of consciousness,  Tremor, insomnia, depression, anxiety, and suicidal ideation.      Objective:  BP 140/84 (BP Location: Left Arm, Patient Position: Sitting, Cuff Size: Normal)   Pulse (!) 57   Temp (!) 97.3 F (36.3 C) (Oral)   Resp 15   Ht 5\' 9"  (1.753 m)   Wt 183 lb (83 kg)   SpO2 96%   BMI 27.02 kg/m   BP Readings from Last 3 Encounters:  03/30/17 140/84  09/27/16 132/64    09/27/16 132/64    Wt Readings from Last 3 Encounters:  03/30/17 183 lb (83 kg)  09/27/16 183 lb 12.8 oz (83.4 kg)  09/27/16 183 lb 12.8 oz (83.4 kg)    General appearance: alert, cooperative and appears stated age Ears: normal TM's and external ear canals both ears Throat: lips, mucosa, and tongue normal; teeth and gums normal Neck: no adenopathy, no carotid bruit, supple, symmetrical, trachea midline and thyroid not enlarged, symmetric, no tenderness/mass/nodules Back: symmetric, no curvature. ROM normal. No CVA tenderness. Lungs: clear to auscultation bilaterally Heart: regular rate and rhythm, S1, S2 normal, no murmur, click, rub or gallop Abdomen: soft, non-tender; bowel sounds normal; no masses,  no organomegaly Pulses: 2+ and symmetric Skin: Skin color, texture, turgor normal. No rashes or lesions Lymph nodes: Cervical, supraclavicular, and axillary nodes normal.  No results found for: HGBA1C  Lab Results  Component Value Date   CREATININE 1.44 03/30/2017   CREATININE 1.39 09/27/2016   CREATININE 1.52 (H) 04/20/2016    Lab Results  Component Value Date   WBC 6.2 03/30/2017   HGB 13.1 03/30/2017   HCT 39.6 03/30/2017   PLT 201.0 03/30/2017   GLUCOSE 90 03/30/2017   CHOL 109 03/30/2017   TRIG 63.0 03/30/2017   HDL 65.80 03/30/2017   LDLCALC 31 03/30/2017   ALT 9 03/30/2017   AST 16 03/30/2017   NA 141 03/30/2017   K 4.2 03/30/2017   CL 104 03/30/2017   CREATININE 1.44 03/30/2017   BUN 24 (H) 03/30/2017   CO2 29 03/30/2017   TSH 1.88 03/30/2017   PSA 13.67 (H) 05/15/2014   MICROALBUR <0.7 06/20/2014     Assessment & Plan:   Problem List Items Addressed This Visit    CKD (chronic kidney disease), stage II    Stable.  Avoiding nsaids.       Relevant Orders   Comprehensive metabolic panel (Completed)   PTH, Intact and Calcium (Completed)   Hypertension    Well controlled on current regimen. Renal function stable, no changes today.  Lab Results   Component Value Date   CREATININE 1.44 03/30/2017   Lab Results  Component Value Date   NA 141 03/30/2017   K 4.2 03/30/2017   CL 104 03/30/2017   CO2 29 03/30/2017         Relevant Medications   losartan-hydrochlorothiazide (HYZAAR) 50-12.5 MG tablet   doxazosin (CARDURA) 4 MG tablet   Osteoporosis    Secondary to Lupron therapy.  Has been taking alendronate on and off since 2009.  Will repeat DEXA and take a medication holiday      Relevant Orders   DG Bone Density   Prostate cancer ([place])  Receiving Lupron  2nd dose,  PSA is zero        Other Visit Diagnoses    Vitamin D deficiency    -  Primary   Relevant Orders   VITAMIN D 25 Hydroxy (Vit-D Deficiency, Fractures) (Completed)   Fatigue, unspecified type       Relevant Orders   TSH (Completed)   CBC with Differential/Platelet (Completed)   Pure hypercholesterolemia       Relevant Medications   losartan-hydrochlorothiazide (HYZAAR) 50-12.5 MG tablet   doxazosin (CARDURA) 4 MG tablet   Other Relevant Orders   Lipid panel (Completed)      I have discontinued [name] C. Elison's meclizine, losartan, and hydrochlorothiazide. I am also having him start on losartan-hydrochlorothiazide and Zoster Vaccine Adjuvanted. Additionally, I am having him maintain his Vitamin D2, calcium carbonate, AMBULATORY NON FORMULARY MEDICATION, finasteride, meloxicam, albuterol, alendronate, ADVAIR DISKUS, timolol, and doxazosin. We will continue to administer ipratropium-albuterol.  Meds ordered this encounter  Medications  . losartan-hydrochlorothiazide (HYZAAR) 50-12.5 MG tablet    Sig: Take 1 tablet by mouth daily.    Dispense:  90 tablet    Refill:  3  . doxazosin (CARDURA) 4 MG tablet    Sig: Take 1 tablet (4 mg total) by mouth at bedtime.    Dispense:  90 tablet    Refill:  1  . Zoster Vaccine Adjuvanted [facility]) injection    Sig: Inject 0.5 mLs into the muscle once for 1 dose.    Dispense:  1 each    Refill:  1     Medications Discontinued During This Encounter  Medication Reason  . AMBULATORY NON FORMULARY MEDICATION Patient has not taken in last 30 days  . doxazosin (CARDURA) 4 MG tablet Duplicate  . meclizine (ANTIVERT) 25 MG tablet   . doxazosin (CARDURA) 4 MG tablet Reorder  . losartan (COZAAR) 50 MG tablet   . hydrochlorothiazide (MICROZIDE) 12.5 MG capsule    A total of 25 minutes of face to face time was spent with patient more than half of which was spent in counselling about the above mentioned conditions  and coordination of care  Follow-up: Return in about 6 months (around 09/28/2017).   Crecencio Mc, MD

## 2017-03-30 NOTE — Patient Instructions (Signed)
I am repeating your Bone Density Test  This year because you have been taking alendronate for over 5 years and we will be stopping it shortly   You are doing well!! I'll see you in 6 months

## 2017-03-31 LAB — PTH, INTACT AND CALCIUM
Calcium: 9.4 mg/dL (ref 8.6–10.3)
PTH: 30 pg/mL (ref 14–64)

## 2017-04-01 ENCOUNTER — Encounter: Payer: Self-pay | Admitting: Internal Medicine

## 2017-04-01 NOTE — Assessment & Plan Note (Signed)
Well controlled on current regimen. Renal function stable, no changes today.  Lab Results  Component Value Date   CREATININE 1.44 03/30/2017   Lab Results  Component Value Date   NA 141 03/30/2017   K 4.2 03/30/2017   CL 104 03/30/2017   CO2 29 03/30/2017

## 2017-04-01 NOTE — Assessment & Plan Note (Signed)
Secondary to Lupron therapy.  Has been taking alendronate on and off since 2009.  Will repeat DEXA and take a medication holiday

## 2017-04-01 NOTE — Assessment & Plan Note (Signed)
Receiving Lupron  2nd dose,  PSA is zero

## 2017-04-01 NOTE — Assessment & Plan Note (Signed)
Stable.  Avoiding nsaids.

## 2017-04-15 ENCOUNTER — Other Ambulatory Visit: Payer: Self-pay | Admitting: Internal Medicine

## 2017-04-15 ENCOUNTER — Telehealth: Payer: Self-pay | Admitting: Internal Medicine

## 2017-04-15 NOTE — Telephone Encounter (Signed)
Cancel the refill on alendronate ;  He should be on a drug holiday

## 2017-04-15 NOTE — Telephone Encounter (Signed)
Refilled: 05/18/2016 Last OV: 03/30/2017 Next OV: 09/28/2017  According to the note from 03/30/2017 the pt was taking a break from the fosamax.

## 2017-04-18 NOTE — Telephone Encounter (Signed)
rx has been canceled

## 2017-05-15 ENCOUNTER — Other Ambulatory Visit: Payer: Self-pay | Admitting: Internal Medicine

## 2017-05-31 DIAGNOSIS — H6983 Other specified disorders of Eustachian tube, bilateral: Secondary | ICD-10-CM | POA: Diagnosis not present

## 2017-06-01 ENCOUNTER — Ambulatory Visit
Admission: RE | Admit: 2017-06-01 | Discharge: 2017-06-01 | Disposition: A | Discharge status: Home / Self Care | Payer: Medicare Other | Source: Ambulatory Visit | Attending: Internal Medicine | Admitting: Internal Medicine

## 2017-06-01 DIAGNOSIS — M810 Age-related osteoporosis without current pathological fracture: Secondary | ICD-10-CM | POA: Insufficient documentation

## 2017-06-01 DIAGNOSIS — Z7983 Long term (current) use of bisphosphonates: Secondary | ICD-10-CM | POA: Insufficient documentation

## 2017-06-01 DIAGNOSIS — Z8546 Personal history of malignant neoplasm of prostate: Secondary | ICD-10-CM | POA: Diagnosis not present

## 2017-06-05 ENCOUNTER — Encounter: Payer: Self-pay | Admitting: Internal Medicine

## 2017-06-23 ENCOUNTER — Other Ambulatory Visit: Payer: Self-pay | Admitting: Internal Medicine

## 2017-07-20 ENCOUNTER — Encounter: Payer: Self-pay | Admitting: Internal Medicine

## 2017-07-21 ENCOUNTER — Other Ambulatory Visit: Payer: Self-pay | Admitting: Internal Medicine

## 2017-07-21 DIAGNOSIS — I129 Hypertensive chronic kidney disease with stage 1 through stage 4 chronic kidney disease, or unspecified chronic kidney disease: Secondary | ICD-10-CM | POA: Diagnosis not present

## 2017-07-21 DIAGNOSIS — N183 Chronic kidney disease, stage 3 (moderate): Secondary | ICD-10-CM | POA: Diagnosis not present

## 2017-07-21 DIAGNOSIS — N200 Calculus of kidney: Secondary | ICD-10-CM | POA: Diagnosis not present

## 2017-07-21 MED ORDER — TELMISARTAN-HCTZ 40-12.5 MG PO TABS: 1.0000 | ORAL_TABLET | Freq: Every day | ORAL | 2 refills | Status: DC

## 2017-07-21 NOTE — Progress Notes (Unsigned)
r 

## 2017-07-28 DIAGNOSIS — H401112 Primary open-angle glaucoma, right eye, moderate stage: Secondary | ICD-10-CM | POA: Diagnosis not present

## 2017-08-05 DIAGNOSIS — C61 Malignant neoplasm of prostate: Secondary | ICD-10-CM | POA: Diagnosis not present

## 2017-08-26 DIAGNOSIS — H401112 Primary open-angle glaucoma, right eye, moderate stage: Secondary | ICD-10-CM | POA: Diagnosis not present

## 2017-09-28 ENCOUNTER — Ambulatory Visit (INDEPENDENT_AMBULATORY_CARE_PROVIDER_SITE_OTHER): Payer: Medicare Other | Admitting: Internal Medicine

## 2017-09-28 ENCOUNTER — Ambulatory Visit (INDEPENDENT_AMBULATORY_CARE_PROVIDER_SITE_OTHER): Payer: Medicare Other

## 2017-09-28 ENCOUNTER — Ambulatory Visit: Payer: Medicare Other

## 2017-09-28 ENCOUNTER — Encounter: Payer: Self-pay | Admitting: Internal Medicine

## 2017-09-28 VITALS — BP 136/76 | HR 61 | Temp 97.9°F | Resp 15 | Ht 69.0 in | Wt 179.0 lb

## 2017-09-28 DIAGNOSIS — N182 Chronic kidney disease, stage 2 (mild): Secondary | ICD-10-CM | POA: Diagnosis not present

## 2017-09-28 DIAGNOSIS — R5383 Other fatigue: Secondary | ICD-10-CM

## 2017-09-28 DIAGNOSIS — Z0000 Encounter for general adult medical examination without abnormal findings: Secondary | ICD-10-CM

## 2017-09-28 DIAGNOSIS — J432 Centrilobular emphysema: Secondary | ICD-10-CM

## 2017-09-28 DIAGNOSIS — M47812 Spondylosis without myelopathy or radiculopathy, cervical region: Secondary | ICD-10-CM

## 2017-09-28 DIAGNOSIS — I10 Essential (primary) hypertension: Secondary | ICD-10-CM | POA: Diagnosis not present

## 2017-09-28 DIAGNOSIS — N401 Enlarged prostate with lower urinary tract symptoms: Secondary | ICD-10-CM | POA: Diagnosis not present

## 2017-09-28 DIAGNOSIS — R3914 Feeling of incomplete bladder emptying: Secondary | ICD-10-CM

## 2017-09-28 LAB — COMPREHENSIVE METABOLIC PANEL
ALT: 10 U/L (ref 0–53)
AST: 15 U/L (ref 0–37)
Albumin: 3.7 g/dL (ref 3.5–5.2)
Alkaline Phosphatase: 65 U/L (ref 39–117)
BILIRUBIN TOTAL: 0.8 mg/dL (ref 0.2–1.2)
BUN: 33 mg/dL — AB (ref 6–23)
CALCIUM: 9.9 mg/dL (ref 8.4–10.5)
CO2: 30 meq/L (ref 19–32)
CREATININE: 1.49 mg/dL (ref 0.40–1.50)
Chloride: 104 mEq/L (ref 96–112)
GFR: 47.19 mL/min — ABNORMAL LOW (ref >60.00)
GLUCOSE: 95 mg/dL (ref 70–99)
Potassium: 4 mEq/L (ref 3.5–5.1)
SODIUM: 141 meq/L (ref 135–145)
Total Protein: 6.3 g/dL (ref 6.0–8.3)

## 2017-09-28 LAB — CBC WITH DIFFERENTIAL/PLATELET
BASOS ABS: 0.1 10*3/uL (ref 0.0–0.1)
Basophils Relative: 1 % (ref 0.0–3.0)
EOS ABS: 0.3 10*3/uL (ref 0.0–0.7)
Eosinophils Relative: 4.9 % (ref 0.0–5.0)
HEMATOCRIT: 38.4 % — AB (ref 39.0–52.0)
Hemoglobin: 13.2 g/dL (ref 13.0–17.0)
LYMPHS PCT: 24 % (ref 12.0–46.0)
Lymphs Abs: 1.5 10*3/uL (ref 0.7–4.0)
MCHC: 34.2 g/dL (ref 30.0–36.0)
MCV: 94.9 fl (ref 78.0–100.0)
MONO ABS: 0.7 10*3/uL (ref 0.1–1.0)
Monocytes Relative: 10.4 % (ref 3.0–12.0)
NEUTROS ABS: 3.8 10*3/uL (ref 1.4–7.7)
NEUTROS PCT: 59.7 % (ref 43.0–77.0)
PLATELETS: 232 10*3/uL (ref 150.0–400.0)
RBC: 4.05 Mil/uL — ABNORMAL LOW (ref 4.22–5.81)
RDW: 13.7 % (ref 11.5–15.5)
WBC: 6.4 10*3/uL (ref 4.0–10.5)

## 2017-09-28 NOTE — Patient Instructions (Addendum)
  Eric Hicks , Thank you for taking time to come for your Medicare Wellness Visit. I appreciate your ongoing commitment to your health goals. Please review the following plan we discussed and let me know if I can assist you in the future.   These are the goals we discussed: Goals    . Increase physical activity     Core strengthening exercises (target waist line) daily.        This is a list of the screening recommended for you and due dates:  Health Maintenance  Topic Date Due  . Flu Shot  11/24/2017  . Tetanus Vaccine  06/13/2022  . Pneumonia vaccines  Completed

## 2017-09-28 NOTE — Patient Instructions (Addendum)
Your blood pressure is too low.   Stop taking the telmisartan today. continue to check BP once a day and let me see the readings in 2 weeks .  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  You can buy theses at [facility],  [employer],  [employer] etc

## 2017-09-28 NOTE — Progress Notes (Signed)
Subjective:   Eric Hicks is a 84 y.o. male who presents for Medicare Annual/Subsequent preventive examination.  Review of Systems:  No ROS.  Medicare Wellness Visit. Additional risk factors are reflected in the social history. Cardiac Risk Factors include: advanced age (>73mn, >>65women);male gender     Objective:    Vitals: BP 136/76 (BP Location: Left Arm, Patient Position: Sitting, Cuff Size: Normal)   Pulse 61   Temp 97.9 F (36.6 C) (Oral)   Resp 15   Ht 5' 9"  (1.753 m)   Wt 179 lb (81.2 kg)   SpO2 94%   BMI 26.43 kg/m   Body mass index is 26.43 kg/m.  Advanced Directives 09/28/2017 09/27/2016  Does Patient Have a Medical Advance Directive? Yes Yes  Type of AParamedicof ARipleyLiving will HGunnisonLiving will  Does patient want to make changes to medical advance directive? No - Patient declined No - Patient declined  Copy of HIn[place]sin Chart? Yes Yes    Tobacco Social History   Tobacco Use  Smoking Status Former Smoker  Smokeless Tobacco Never Used     Counseling given: Not Answered   Clinical Intake:  Pre-visit preparation completed: Yes  Pain : No/denies pain     Nutritional Status: BMI 25 -29 Overweight Diabetes: No  How often do you need to have someone help you when you read instructions, pamphlets, or other written materials from your doctor or pharmacy?: 1 - Never  Interpreter Needed?: No     Past Medical History:  Diagnosis Date  . BPH (benign prostatic hyperplasia)    managed by DOlena Heckle . COPD (chronic obstructive pulmonary disease) ([place]   . Elevated PSA, between 10 and less than 20 ng/ml    DOlena Heckle watchful waiting  . Hypertension   . Osteoporosis    by DEXA   Past Surgical History:  Procedure Laterality Date  . TYMPANOSTOMY TUBE PLACEMENT  2017   Family History  Problem Relation Age of Onset  . Diabetes Mother   . Hypertension Father   . Cancer Brother  832      multiple myeloma   Social History   Socioeconomic History  . Marital status: Married    Spouse name: Not on file  . Number of children: Not on file  . Years of education: Not on file  . Highest education level: Not on file  Occupational History  . Not on file  Social Needs  . Financial resource strain: Not hard at all  . Food insecurity:    Worry: Never true    Inability: Never true  . Transportation needs:    Medical: No    Non-medical: No  Tobacco Use  . Smoking status: Former SResearch scientist (life sciences) . Smokeless tobacco: Never Used  Substance and Sexual Activity  . Alcohol use: No  . Drug use: Not on file  . Sexual activity: Never  Lifestyle  . Physical activity:    Days per week: Not on file    Minutes per session: Not on file  . Stress: Not at all  Relationships  . Social connections:    Talks on phone: Not on file    Gets together: Not on file    Attends religious service: Not on file    Active member of club or organization: Not on file    Attends meetings of clubs or organizations: Not on file    Relationship status: Not on file  Other Topics Concern  . Not on file  Social History Narrative   Regular exercise-yew    Outpatient Encounter Medications as of 09/28/2017  Medication Sig  . albuterol (PROVENTIL) (2.5 MG/3ML) 0.083% nebulizer solution Take 3 mLs (2.5 mg total) by nebulization every 6 (six) hours as needed for wheezing or shortness of breath.  . AMBULATORY NON FORMULARY MEDICATION Joint Advantage Gold 2 tablets daily  . calcium carbonate (OS-CAL) 600 MG TABS Take 600 mg by mouth 2 (two) times daily.  Marland Kitchen doxazosin (CARDURA) 4 MG tablet Take 1 tablet (4 mg total) by mouth at bedtime.  . finasteride (PROSCAR) 5 MG tablet Take 5 mg by mouth daily.  . meloxicam (MOBIC) 15 MG tablet Take 1 tablet (15 mg total) by mouth daily. (Patient taking differently: Take 15 mg by mouth daily as needed. )  . telmisartan-hydrochlorothiazide (MICARDIS HCT) 40-12.5 MG tablet  Take 1 tablet by mouth daily.  Grant Ruts INHUB 250-50 MCG/DOSE AEPB INHALE 1 PUFF INTO THE LUNGS TWICE DAILY  . [DISCONTINUED] ADVAIR DISKUS 250-50 MCG/DOSE AEPB INHALE 1 PUFF INTO THE LUNGS TWICE DAILY  . [DISCONTINUED] Cholecalciferol (VITAMIN D3 PO) Take 1 capsule by mouth 2 (two) times daily.  . [DISCONTINUED] timolol (TIMOPTIC) 0.5 % ophthalmic solution TAKE 1 DROP(S) IN RIGHT EYE 2 TIMES A DAY  . Cholecalciferol (VITAMIN D3) 400 units tablet Take by mouth 2 (two) times daily.  . dorzolamide-timolol (COSOPT) 22.3-6.8 MG/ML ophthalmic solution TAKE 1 DROP(S) IN RIGHT EYE 2 TIMES A DAY  . [DISCONTINUED] Ergocalciferol (VITAMIN D2) 400 UNITS TABS Take 1 tablet by mouth 2 (two) times daily.    Facility-Administered Encounter Medications as of 09/28/2017  Medication  . ipratropium-albuterol (DUONEB) 0.5-2.5 (3) MG/3ML nebulizer solution 3 mL    Activities of Daily Living In your present state of health, do you have any difficulty performing the following activities: 09/28/2017  Hearing? Y  Comment Hearing aid, bilateral  Vision? N  Difficulty concentrating or making decisions? N  Walking or climbing stairs? N  Dressing or bathing? N  Doing errands, shopping? N  Preparing Food and eating ? N  Using the Toilet? N  In the past six months, have you accidently leaked urine? N  Do you have problems with loss of bowel control? N  Managing your Medications? N  Managing your Finances? N  Housekeeping or managing your Housekeeping? N  Some recent data might be hidden    Patient Care Team: Crecencio Mc, MD as PCP - General (Internal Medicine)   Assessment:   This is a routine wellness examination for Eric Hicks.  The goal of the wellness visit is to assist the patient how to close the gaps in care and create a preventative care plan for the patient.   The roster of all physicians providing medical care to patient is listed in the Snapshot section of the chart.  Taking calcium VIT D as  appropriate/Osteoporosis risk reviewed.    Safety issues reviewed; Smoke and carbon monoxide detectors in the home. No firearms in the home. Wears seatbelts when driving or riding with others. No violence in the home.  They do not have excessive sun exposure.  Discussed the need for sun protection: hats, long sleeves and the use of sunscreen if there is significant sun exposure.  Patient is alert, normal appearance, oriented to person/place/and time.  Correctly identified the president of the [place] and recalls of 3/3 words. Performs simple calculations and can read correct time from watch face.  Displays appropriate  judgement.  No new identified risk were noted.  No failures at ADL's or IADL's.   BMI- discussed the importance of a healthy diet, water intake and the benefits of aerobic exercise. Educational material provided.   24 hour diet recall: Regular diet  Dental- every 6 months.  Eye- Visual acuity not assessed per patient preference since they have regular follow up with the ophthalmologist.  Wears corrective lenses.  Sleep patterns- Sleeps without issues.   Health maintenance gaps- closed.  Exercise Activities and Dietary recommendations Current Exercise Habits: Home exercise routine, Type of exercise: walking, Time (Minutes): 20, Frequency (Times/Week): 3, Weekly Exercise (Minutes/Week): 60, Intensity: Mild  Goals    . Increase physical activity     Core strengthening exercises (target waist line) daily.        Fall Risk Fall Risk  09/28/2017 09/27/2016 07/24/2015 04/10/2014  Falls in the past year? No No No No   Depression Screen PHQ 2/9 Scores 09/28/2017 09/27/2016 07/24/2015 04/10/2014  PHQ - 2 Score 0 0 0 0  PHQ- 9 Score - 0 - -    Cognitive Function MMSE - Mini Mental State Exam 09/27/2016  Orientation to time 5  Orientation to Place 5  Registration 3  Attention/ Calculation 5  Recall 3  Language- name 2 objects 2  Language- repeat 1  Language- follow 3 step  command 3  Language- read & follow direction 1  Write a sentence 1  Copy design 1  Total score 30     6CIT Screen 09/28/2017  What Year? 0 points  What month? 0 points  What time? 0 points  Count back from 20 0 points  Months in reverse 0 points  Repeat phrase 0 points  Total Score 0    Immunization History  Administered Date(s) Administered  . Influenza, High Dose Seasonal PF 02/07/2016, 02/09/2017  . Influenza-Unspecified 02/26/2011, 02/13/2014, 02/12/2015  . Pneumococcal Conjugate-13 11/20/2013  . Pneumococcal Polysaccharide-23 11/11/1999, 11/11/2006  . Tdap 06/13/2012  . Zoster 02/13/2014   Screening Tests Health Maintenance  Topic Date Due  . INFLUENZA VACCINE  11/24/2017  . TETANUS/TDAP  06/13/2022  . PNA vac Low Risk Adult  Completed       Plan:    End of life planning; Advance aging; Advanced directives discussed. Copy of current HCPOA/Living Will on file.     I have personally reviewed and noted the following in the patient's chart:   . Medical and social history . Use of alcohol, tobacco or illicit drugs  . Current medications and supplements . Functional ability and status . Nutritional status . Physical activity . Advanced directives . List of other physicians . Hospitalizations, surgeries, and ER visits in previous 12 months . Vitals . Screenings to include cognitive, depression, and falls . Referrals and appointments  In addition, I have reviewed and discussed with patient certain preventive protocols, quality metrics, and best practice recommendations. A written personalized care plan for preventive services as well as general preventive health recommendations were provided to patient.     Varney Biles, LPN  04/27/8784

## 2017-09-28 NOTE — Progress Notes (Signed)
Subjective:  Patient ID: Eric Hicks, male    DOB: 01-Dec-1926  Age: 84 y.o. MRN: [id number]  CC: The primary encounter diagnosis was Fatigue, unspecified type. Diagnoses of Essential hypertension, CKD (chronic kidney disease), stage II, Spondylosis of cervical region without myelopathy or radiculopathy, Centrilobular emphysema ([place]), and Benign prostatic hyperplasia with incomplete bladder emptying were also pertinent to this visit.  HPI Eric Hicks presents for 6 month follow up on hypertension , BpH,  COPD  Home readings on BP have been low  Ranging from 90/67 to 120/70 and home amchine is reading 10 pts lower than office readibg  Taking telmisartan/hct,  proscar and cardura. Endorses fatigue x 1 month   sleeping well  From 10 pm to 6PM   Not walking daily.   Eating 3 meals daily.  Does not cook,  But heats up prepared meals. Eats salads which he prepares on his own.  Intentionally losing weight.   Staying busy. Widower 1.5 years.   Build model airplanes.  Has a dog.  No falls. Has developed a platonic relationship with a male companion who is also widowed.   Neck has been popping on right side. Denies pain , radiculopathy.  Outpatient Medications Prior to Visit  Medication Sig Dispense Refill  . albuterol (PROVENTIL) (2.5 MG/3ML) 0.083% nebulizer solution Take 3 mLs (2.5 mg total) by nebulization every 6 (six) hours as needed for wheezing or shortness of breath. 150 mL 1  . AMBULATORY NON FORMULARY MEDICATION Joint Advantage Gold 2 tablets daily    . calcium carbonate (OS-CAL) 600 MG TABS Take 600 mg by mouth 2 (two) times daily.    . Cholecalciferol (VITAMIN D3) 400 units tablet Take by mouth 2 (two) times daily.    . dorzolamide-timolol (COSOPT) 22.3-6.8 MG/ML ophthalmic solution TAKE 1 DROP(S) IN RIGHT EYE 2 TIMES A DAY  5  . doxazosin (CARDURA) 4 MG tablet Take 1 tablet (4 mg total) by mouth at bedtime. 90 tablet 1  . finasteride (PROSCAR) 5 MG tablet Take 5 mg by mouth daily.     . meloxicam (MOBIC) 15 MG tablet Take 1 tablet (15 mg total) by mouth daily. (Patient taking differently: Take 15 mg by mouth daily as needed. ) 90 tablet 1  . WIXELA INHUB 250-50 MCG/DOSE AEPB INHALE 1 PUFF INTO THE LUNGS TWICE DAILY  1  . telmisartan-hydrochlorothiazide (MICARDIS HCT) 40-12.5 MG tablet Take 1 tablet by mouth daily. 30 tablet 2  . timolol (TIMOPTIC) 0.5 % ophthalmic solution TAKE 1 DROP(S) IN RIGHT EYE 2 TIMES A DAY  5   Facility-Administered Medications Prior to Visit  Medication Dose Route Frequency Provider Last Rate Last Dose  . ipratropium-albuterol (DUONEB) 0.5-2.5 (3) MG/3ML nebulizer solution 3 mL  3 mL Nebulization Q6H Crecencio Mc, MD        Review of Systems;  Patient denies headache, fevers, malaise, unintentional weight loss, skin rash, eye pain, sinus congestion and sinus pain, sore throat, dysphagia,  hemoptysis , cough, dyspnea, wheezing, chest pain, palpitations, orthopnea, edema, abdominal pain, nausea, melena, diarrhea, constipation, flank pain, dysuria, hematuria, urinary  Frequency, nocturia, numbness, tingling, seizures,  Focal weakness, Loss of consciousness,  Tremor, insomnia, depression, anxiety, and suicidal ideation.      Objective:  BP 136/76 (BP Location: Left Arm, Patient Position: Sitting, Cuff Size: Normal)   Pulse 61   Temp 97.9 F (36.6 C) (Oral)   Resp 15   Ht 5\' 9"  (1.753 m)   Wt 179 lb (81.2 kg)  SpO2 94%   BMI 26.43 kg/m   BP Readings from Last 3 Encounters:  09/28/17 136/76  09/28/17 136/76  03/30/17 140/84    Wt Readings from Last 3 Encounters:  09/28/17 179 lb (81.2 kg)  09/28/17 179 lb (81.2 kg)  03/30/17 183 lb (83 kg)    General appearance: alert, cooperative and appears stated age Ears: normal TM's and external ear canals both ears Throat: lips, mucosa, and tongue normal; teeth and gums normal Neck: no adenopathy, no carotid bruit, supple, symmetrical, trachea midline and thyroid not enlarged, symmetric,  no tenderness/mass/nodules Back: symmetric, no curvature. ROM normal. No CVA tenderness. Lungs: clear to auscultation bilaterally Heart: regular rate and rhythm, S1, S2 normal, no murmur, click, rub or gallop Abdomen: soft, non-tender; bowel sounds normal; no masses,  no organomegaly Pulses: 2+ and symmetric Skin: Skin color, texture, turgor normal. No rashes or lesions Lymph nodes: Cervical, supraclavicular, and axillary nodes normal.  No results found for: HGBA1C  Lab Results  Component Value Date   CREATININE 1.49 09/28/2017   CREATININE 1.44 03/30/2017   CREATININE 1.39 09/27/2016    Lab Results  Component Value Date   WBC 6.4 09/28/2017   HGB 13.2 09/28/2017   HCT 38.4 (L) 09/28/2017   PLT 232.0 09/28/2017   GLUCOSE 95 09/28/2017   CHOL 109 03/30/2017   TRIG 63.0 03/30/2017   HDL 65.80 03/30/2017   LDLCALC 31 03/30/2017   ALT 10 09/28/2017   AST 15 09/28/2017   NA 141 09/28/2017   K 4.0 09/28/2017   CL 104 09/28/2017   CREATININE 1.49 09/28/2017   BUN 33 (H) 09/28/2017   CO2 30 09/28/2017   TSH 1.88 03/30/2017   PSA 13.67 (H) 05/15/2014   MICROALBUR <0.7 06/20/2014    Dg Bone Density  Result Date: 06/01/2017 EXAM: DUAL X-RAY ABSORPTIOMETRY (DXA) FOR BONE MINERAL DENSITY IMPRESSION: Dear Dr Derrel Nip, Your patient Eric Hicks completed a BMD test on 06/01/2017 using the [place] (analysis version: 14.10) manufactured by [employer]. The following summarizes the results of our evaluation. PATIENT BIOGRAPHICAL: Name: Eric Hicks, Eric Hicks Patient ID: [id number] Birth Date: [birth year] Height: 69.0 in. Gender: Male Exam Date: 06/01/2017 Weight: 180.6 lbs. Indications: Advanced Age, Caucasian, COPD, Height Loss, History of Osteoporosis, Hx. of Prostate Cancer, Osteoporotic Fractures: Treatments: Advair Inhaler, Albuterol, Calcium, Vitamin D ASSESSMENT: The BMD measured at Femur Neck Right is 0.739 g/cm2 with a T-score of -2.5 is low. This patient is considered osteoporotic  according to [facility]) criteria. Lumbar spine was not used due to advance degenerative changes. Site Region Measured Measured WHO Young Adult BMD Date       Age      Classification T-score DualFemur Neck Right 06/01/2017 88.7 Osteoporosis -2.5 0.739 g/cm2 DualFemur Neck Right 12/22/2011 83.3 Osteopenia -2.2 0.784 g/cm2 DualFemur Neck Right 02/06/2008 79.4 Osteoporosis -2.6 0.731 g/cm2 DualFemur Total Mean 06/01/2017 88.7 Osteopenia -1.7 0.863 g/cm2 DualFemur Total Mean 12/22/2011 83.3 Osteopenia -1.6 0.871 g/cm2 DualFemur Total Mean 02/06/2008 79.4 Osteopenia -1.9 0.834 g/cm2 Left Forearm Radius 33% 06/01/2017 88.7 Normal 0.1 0.995 g/cm2 Left Forearm Radius 33% 12/22/2011 83.3 Normal 1.7 1.158 g/cm2 Left Forearm Radius 33% 02/06/2008 79.4 Normal 0.9 1.079 g/cm2 World Health Organization [facility]) criteria for post-menopausal, Caucasian Women: Normal:       T-score at or above -1 SD Osteopenia:   T-score between -1 and -2.5 SD Osteoporosis: T-score at or below -2.5 SD RECOMMENDATIONS: [place] recommends that FDA-approved medical therapies be considered in postmenopausal women  and men age 26 or older with a: 1. Hip or vertebral (clinical or morphometric) fracture. 2. T-score of < -2.5 at the spine or hip. 3. Ten-year fracture probability by FRAX of 3% or greater for hip fracture or 20% or greater for major osteoporotic fracture. All treatment decisions require clinical judgment and consideration of individual patient factors, including patient preferences, co-morbidities, previous drug use, risk factors not captured in the FRAX model (e.g. falls, vitamin D deficiency, increased bone turnover, interval significant decline in bone density) and possible under - or over-estimation of fracture risk by FRAX. All patients should ensure an adequate intake of dietary calcium (1200 mg/d) and vitamin D (800 IU daily) unless contraindicated. FOLLOW-UP: People with diagnosed cases of  osteoporosis or osteopenia should be regularly tested for bone mineral density. For patients eligible for Medicare, routine testing is allowed once every 2 years. The testing frequency can be increased to one year for patients who have rapidly progressing disease, or for those who are receiving medical therapy to restore bone mass. I have reviewed this report, and agree with the above findings. [facility] Electronically Signed   By: Rolm Baptise M.D.   On: 06/01/2017 11:48    Assessment & Plan:   Problem List Items Addressed This Visit    OA (osteoarthritis) of neck    exam normal except for crepitus with head turn.    advised to use a cervical pillow , tylenol.       Hypertension    Suspending telmisartan today due to symptomatic hypotension   Lab Results  Component Value Date   CREATININE 1.49 09/28/2017   Lab Results  Component Value Date   NA 141 09/28/2017   K 4.0 09/28/2017   CL 104 09/28/2017   CO2 30 09/28/2017         COPD (chronic obstructive pulmonary disease) with emphysema ([place])    Patient notes rare dyspnea with extreme exertion. Reports this is stable and his COPD is stable. Discussed monitoring this and if there is any increased frequency or progression he should be reevaluated. He'll continue his current medications.      CKD (chronic kidney disease), stage II    Stable.  Avoiding nsaids on a daily basis.    Lab Results  Component Value Date   CREATININE 1.49 09/28/2017         BPH (benign prostatic hyperplasia)    Managed with doxazosin and finasteride.        Other Visit Diagnoses    Fatigue, unspecified type    -  Primary   Relevant Orders   CBC with Differential/Platelet (Completed)   Comprehensive metabolic panel (Completed)     A total of 25 minutes of face to face time was spent with patient more than half of which was spent in counselling about the above mentioned conditions  and coordination of care   I have discontinued  [name] C. Fetherolf's telmisartan-hydrochlorothiazide. I am also having him maintain his calcium carbonate, AMBULATORY NON FORMULARY MEDICATION, finasteride, meloxicam, albuterol, doxazosin, WIXELA INHUB, Vitamin D3, and dorzolamide-timolol. We will continue to administer ipratropium-albuterol.  No orders of the defined types were placed in this encounter.   Medications Discontinued During This Encounter  Medication Reason  . telmisartan-hydrochlorothiazide (MICARDIS HCT) 40-12.5 MG tablet     Follow-up: Return in about 6 months (around 03/30/2018).   Crecencio Mc, MD

## 2017-09-29 DIAGNOSIS — M47812 Spondylosis without myelopathy or radiculopathy, cervical region: Secondary | ICD-10-CM | POA: Insufficient documentation

## 2017-09-29 NOTE — Assessment & Plan Note (Addendum)
Stable.  Avoiding nsaids on a daily basis.    Lab Results  Component Value Date   CREATININE 1.49 09/28/2017

## 2017-09-29 NOTE — Assessment & Plan Note (Signed)
Patient notes rare dyspnea with extreme exertion. Reports this is stable and his COPD is stable. Discussed monitoring this and if there is any increased frequency or progression he should be reevaluated. He'll continue his current medications.

## 2017-09-29 NOTE — Assessment & Plan Note (Signed)
Managed with doxazosin and finasteride.

## 2017-09-29 NOTE — Assessment & Plan Note (Addendum)
exam normal except for crepitus with head turn.    advised to use a cervical pillow , tylenol.

## 2017-09-29 NOTE — Assessment & Plan Note (Signed)
Suspending telmisartan today due to symptomatic hypotension   Lab Results  Component Value Date   CREATININE 1.49 09/28/2017   Lab Results  Component Value Date   NA 141 09/28/2017   K 4.0 09/28/2017   CL 104 09/28/2017   CO2 30 09/28/2017

## 2017-10-13 ENCOUNTER — Other Ambulatory Visit: Payer: Self-pay | Admitting: Internal Medicine

## 2017-10-23 ENCOUNTER — Telehealth: Payer: Self-pay | Admitting: Internal Medicine

## 2017-10-23 NOTE — Telephone Encounter (Signed)
His hoe BP readings border on too low.  Since he is not taking BP medications,  His prostate medications may be the cause.  He did not mention if he was feeling dizzy at times,  So if he is,  The doxazosin dose should be reduced to 2 mg

## 2017-10-24 MED ORDER — DOXAZOSIN MESYLATE 2 MG PO TABS: 2.0000 mg | ORAL_TABLET | Freq: Every day | ORAL | 5 refills | Status: DC

## 2017-10-24 NOTE — Telephone Encounter (Signed)
Spoke with pt to let him know that Dr. Derrel Nip would still like for him to reduce his dose to 2mg  daily. Pt gave a verbal understanding.

## 2017-10-24 NOTE — Telephone Encounter (Signed)
Yes, I would reduce ,  New rx sent for 2 mg dose

## 2017-10-24 NOTE — Telephone Encounter (Signed)
Spoke with pt and he stated that he has not had an spells of feeling dizzy but he has bee a lot more tired than usual. He stated that if he feels tried and he sits down 9 times out of 10 he is going to fall asleep. Pt stated that this is a change for him. Does pt still need to reduce his doxazosin dose to 2mg ?

## 2017-11-28 DIAGNOSIS — H6982 Other specified disorders of Eustachian tube, left ear: Secondary | ICD-10-CM | POA: Diagnosis not present

## 2017-11-28 DIAGNOSIS — H6122 Impacted cerumen, left ear: Secondary | ICD-10-CM | POA: Diagnosis not present

## 2017-12-01 ENCOUNTER — Encounter: Payer: Self-pay | Admitting: Internal Medicine

## 2017-12-01 ENCOUNTER — Ambulatory Visit (INDEPENDENT_AMBULATORY_CARE_PROVIDER_SITE_OTHER): Payer: Medicare Other | Admitting: Internal Medicine

## 2017-12-01 DIAGNOSIS — I10 Essential (primary) hypertension: Secondary | ICD-10-CM | POA: Diagnosis not present

## 2017-12-01 DIAGNOSIS — M545 Low back pain, unspecified: Secondary | ICD-10-CM

## 2017-12-01 DIAGNOSIS — N182 Chronic kidney disease, stage 2 (mild): Secondary | ICD-10-CM | POA: Diagnosis not present

## 2017-12-01 DIAGNOSIS — M810 Age-related osteoporosis without current pathological fracture: Secondary | ICD-10-CM | POA: Diagnosis not present

## 2017-12-01 MED ORDER — MELOXICAM 15 MG PO TABS: 15.0000 mg | ORAL_TABLET | Freq: Every day | ORAL | 5 refills | Status: DC | PRN

## 2017-12-01 MED ORDER — TRAMADOL HCL 50 MG PO TABS: 50.0000 mg | ORAL_TABLET | Freq: Three times a day (TID) | ORAL | 0 refills | Status: DC | PRN

## 2017-12-01 NOTE — Progress Notes (Signed)
Subjective:  Patient ID: Eric Hicks, male    DOB: 1926/06/15  Age: 84 y.o. MRN: [id number]  CC: Diagnoses of Age-related osteoporosis without current pathological fracture, CKD (chronic kidney disease), stage II, Acute midline low back pain without sciatica, and Essential hypertension were pertinent to this visit.  HPI Eric Hicks presents for follow up on back pain and fatigue.  At previous visit in June he was reporting pain in the cervical spine.  The pain was non radicular and was attributed to DDD given review of plain films from 2007 .  Her reports today that the cervical spine pain has improved.    Today he reports low central back pain that is episodic and occasionally involves the right hip,  And started after engaging in  unusual activity for his  Age (he had been manually washing the exterior of his house with a hose and a brush) and fell onto his left side when he tripped over the front porch banister .  Took Aleve daily for 6-7 days with no significant improvement in his pain   He has CKD and prior to diagnosis was using meloxicam with good results   2) A recent [facility] screening suggested atrial fibrillation but no EKG is available for evaluatio today    Outpatient Medications Prior to Visit  Medication Sig Dispense Refill  . albuterol (PROVENTIL) (2.5 MG/3ML) 0.083% nebulizer solution Take 3 mLs (2.5 mg total) by nebulization every 6 (six) hours as needed for wheezing or shortness of breath. 150 mL 1  . AMBULATORY NON FORMULARY MEDICATION Joint Advantage Gold 2 tablets daily    . calcium carbonate (OS-CAL) 600 MG TABS Take 600 mg by mouth 2 (two) times daily.    . Cholecalciferol (VITAMIN D3) 400 units tablet Take by mouth 2 (two) times daily.    . dorzolamide-timolol (COSOPT) 22.3-6.8 MG/ML ophthalmic solution TAKE 1 DROP(S) IN RIGHT EYE 2 TIMES A DAY  5  . doxazosin (CARDURA) 2 MG tablet Take 1 tablet (2 mg total) by mouth at bedtime. 30 tablet 5  . finasteride (PROSCAR)  5 MG tablet Take 5 mg by mouth daily.    Marland Kitchen leuprolide, 6 Month, (ELIGARD) 45 MG injection Inject 45 mg into the skin every 6 (six) months.    Grant Ruts INHUB 250-50 MCG/DOSE AEPB INHALE 1 PUFF INTO THE LUNGS TWICE DAILY  1  . meloxicam (MOBIC) 15 MG tablet Take 1 tablet (15 mg total) by mouth daily. (Patient taking differently: Take 15 mg by mouth daily as needed. ) 90 tablet 1   Facility-Administered Medications Prior to Visit  Medication Dose Route Frequency Provider Last Rate Last Dose  . ipratropium-albuterol (DUONEB) 0.5-2.5 (3) MG/3ML nebulizer solution 3 mL  3 mL Nebulization Q6H Crecencio Mc, MD        Review of Systems;  Patient denies headache, fevers, malaise, unintentional weight loss, skin rash, eye pain, sinus congestion and sinus pain, sore throat, dysphagia,  hemoptysis , cough, dyspnea, wheezing, chest pain, palpitations, orthopnea, edema, abdominal pain, nausea, melena, diarrhea, constipation, flank pain, dysuria, hematuria, urinary  Frequency, nocturia, numbness, tingling, seizures,  Focal weakness, Loss of consciousness,  Tremor, insomnia, depression, anxiety, and suicidal ideation.      Objective:  BP (!) 148/100 (BP Location: Left Arm, Patient Position: Sitting, Cuff Size: Normal)   Pulse 87   Temp 97.9 F (36.6 C) (Oral)   Resp 15   Ht 5\' 9"  (1.753 m)   Wt 180 lb 12.8 oz (82  kg)   SpO2 98%   BMI 26.70 kg/m   BP Readings from Last 3 Encounters:  12/01/17 (!) 148/100  09/28/17 136/76  09/28/17 136/76    Wt Readings from Last 3 Encounters:  12/01/17 180 lb 12.8 oz (82 kg)  09/28/17 179 lb (81.2 kg)  09/28/17 179 lb (81.2 kg)    General appearance: alert, cooperative and appears stated age Ears: normal TM's and external ear canals both ears Throat: lips, mucosa, and tongue normal; teeth and gums normal Neck: no adenopathy, no carotid bruit, supple, symmetrical, trachea midline and thyroid not enlarged, symmetric, no tenderness/mass/nodules Back:  symmetric, no curvature. ROM normal. No CVA tenderness. Lungs: clear to auscultation bilaterally Heart: regular rate and rhythm, S1, S2 normal, no murmur, click, rub or gallop Abdomen: soft, non-tender; bowel sounds normal; no masses,  no organomegaly Pulses: 2+ and symmetric Skin: Skin color, texture, turgor normal. No rashes or lesions Lymph nodes: Cervical, supraclavicular, and axillary nodes normal.  No results found for: HGBA1C  Lab Results  Component Value Date   CREATININE 1.49 09/28/2017   CREATININE 1.44 03/30/2017   CREATININE 1.39 09/27/2016    Lab Results  Component Value Date   WBC 6.4 09/28/2017   HGB 13.2 09/28/2017   HCT 38.4 (L) 09/28/2017   PLT 232.0 09/28/2017   GLUCOSE 95 09/28/2017   CHOL 109 03/30/2017   TRIG 63.0 03/30/2017   HDL 65.80 03/30/2017   LDLCALC 31 03/30/2017   ALT 10 09/28/2017   AST 15 09/28/2017   NA 141 09/28/2017   K 4.0 09/28/2017   CL 104 09/28/2017   CREATININE 1.49 09/28/2017   BUN 33 (H) 09/28/2017   CO2 30 09/28/2017   TSH 1.88 03/30/2017   PSA 13.67 (H) 05/15/2014   MICROALBUR <0.7 06/20/2014    Dg Bone Density  Result Date: 06/01/2017 EXAM: DUAL X-RAY ABSORPTIOMETRY (DXA) FOR BONE MINERAL DENSITY IMPRESSION: Dear Dr Derrel Nip, Your patient Eric Hicks completed a BMD test on 06/01/2017 using the [place] (analysis version: 14.10) manufactured by [employer]. The following summarizes the results of our evaluation. PATIENT BIOGRAPHICAL: Name: Eric Hicks, Eric Hicks Patient ID: [id number] Birth Date: Dec 21, 1926 Height: 69.0 in. Gender: Male Exam Date: 06/01/2017 Weight: 180.6 lbs. Indications: Advanced Age, Caucasian, COPD, Height Loss, History of Osteoporosis, Hx. of Prostate Cancer, Osteoporotic Fractures: Treatments: Advair Inhaler, Albuterol, Calcium, Vitamin D ASSESSMENT: The BMD measured at Femur Neck Right is 0.739 g/cm2 with a T-score of -2.5 is low. This patient is considered osteoporotic according to [facility]) criteria. Lumbar spine was not used due to advance degenerative changes. Site Region Measured Measured WHO Young Adult BMD Date       Age      Classification T-score DualFemur Neck Right 06/01/2017 88.7 Osteoporosis -2.5 0.739 g/cm2 DualFemur Neck Right 12/22/2011 83.3 Osteopenia -2.2 0.784 g/cm2 DualFemur Neck Right 02/06/2008 79.4 Osteoporosis -2.6 0.731 g/cm2 DualFemur Total Mean 06/01/2017 88.7 Osteopenia -1.7 0.863 g/cm2 DualFemur Total Mean 12/22/2011 83.3 Osteopenia -1.6 0.871 g/cm2 DualFemur Total Mean 02/06/2008 79.4 Osteopenia -1.9 0.834 g/cm2 Left Forearm Radius 33% 06/01/2017 88.7 Normal 0.1 0.995 g/cm2 Left Forearm Radius 33% 12/22/2011 83.3 Normal 1.7 1.158 g/cm2 Left Forearm Radius 33% 02/06/2008 79.4 Normal 0.9 1.079 g/cm2 World Health Organization [facility]) criteria for post-menopausal, Caucasian Women: Normal:       T-score at or above -1 SD Osteopenia:   T-score between -1 and -2.5 SD Osteoporosis: T-score at or below -2.5 SD RECOMMENDATIONS: [place] recommends that FDA-approved medical  therapies be considered in postmenopausal women and men age 96 or older with a: 1. Hip or vertebral (clinical or morphometric) fracture. 2. T-score of < -2.5 at the spine or hip. 3. Ten-year fracture probability by FRAX of 3% or greater for hip fracture or 20% or greater for major osteoporotic fracture. All treatment decisions require clinical judgment and consideration of individual patient factors, including patient preferences, co-morbidities, previous drug use, risk factors not captured in the FRAX model (e.g. falls, vitamin D deficiency, increased bone turnover, interval significant decline in bone density) and possible under - or over-estimation of fracture risk by FRAX. All patients should ensure an adequate intake of dietary calcium (1200 mg/d) and vitamin D (800 IU daily) unless contraindicated. FOLLOW-UP: People with diagnosed cases of osteoporosis or osteopenia  should be regularly tested for bone mineral density. For patients eligible for Medicare, routine testing is allowed once every 2 years. The testing frequency can be increased to one year for patients who have rapidly progressing disease, or for those who are receiving medical therapy to restore bone mass. I have reviewed this report, and agree with the above findings. [facility] Radiology Electronically Signed   By: Rolm Baptise M.D.   On: 06/01/2017 11:48    Assessment & Plan:   Problem List Items Addressed This Visit    Osteoporosis    Secondary to Lupron. No significant improvement after nearly ten years of alendronate.  No history of fracture . Drug holiday recommended for one year.      CKD (chronic kidney disease), stage II    GFR has improved enough to resume meloxicam for management of OA pain   Lab Results  Component Value Date   CREATININE 1.49 09/28/2017   Lab Results  Component Value Date   NA 141 09/28/2017   K 4.0 09/28/2017   CL 104 09/28/2017   CO2 30 09/28/2017         Hypertension    he reports compliance with medication regimen  but has an elevated reading today in office.  He has been asked to check his BP at home  and  submit readings for evaluation. Renal function has been  checked today      Low back pain    He has no spinal tenderness to suggest fracture but he dos have osteoporosis.  Muscle strain and spasm appear to be more likely.  meloxicam and tramadol prescribed. If no improvement in a week  Palin films of lumbar spine and hip       Relevant Medications   meloxicam (MOBIC) 15 MG tablet   traMADol (ULTRAM) 50 MG tablet      I have discontinued [name] C. Mould's traMADol. I have also changed his meloxicam. Additionally, I am having him start on traMADol. Lastly, I am having him maintain his calcium carbonate, AMBULATORY NON FORMULARY MEDICATION, finasteride, albuterol, WIXELA INHUB, Vitamin D3, dorzolamide-timolol, doxazosin, and leuprolide (6  Month). We will continue to administer ipratropium-albuterol.  Meds ordered this encounter  Medications  . meloxicam (MOBIC) 15 MG tablet    Sig: Take 1 tablet (15 mg total) by mouth daily as needed.    Dispense:  30 tablet    Refill:  5  . DISCONTD: traMADol (ULTRAM) 50 MG tablet    Sig: Take 1 tablet (50 mg total) by mouth every 8 (eight) hours as needed.    Dispense:  30 tablet    Refill:  0  . traMADol (ULTRAM) 50 MG tablet    Sig:  Take 1 tablet (50 mg total) by mouth every 8 (eight) hours as needed.    Dispense:  30 tablet    Refill:  0    Medications Discontinued During This Encounter  Medication Reason  . meloxicam (MOBIC) 15 MG tablet Reorder  . traMADol (ULTRAM) 50 MG tablet     Follow-up: Return in about 2 weeks (around 12/15/2017) for back pain,  BP check .   Crecencio Mc, MD

## 2017-12-01 NOTE — Patient Instructions (Addendum)
Your back pain is coming from the physical  Labor that you are doing on your house, because ir ia  causing your muscles to get sprained.  When muscles get strined or sprained,  They can spasm, which can be quite painful  Muscle relaxers may help ,  But they are very sedating and dangerous to take at your age,     You can take the meloxicam once daily instead of Aleve or MOtrin   Add tramadol  50 mg every 12 hours if needed for severe pain

## 2017-12-03 DIAGNOSIS — M545 Low back pain, unspecified: Secondary | ICD-10-CM | POA: Insufficient documentation

## 2017-12-03 NOTE — Assessment & Plan Note (Signed)
Secondary to Lupron. No significant improvement after nearly ten years of alendronate.  No history of fracture . Drug holiday recommended for one year.

## 2017-12-03 NOTE — Assessment & Plan Note (Signed)
GFR has improved enough to resume meloxicam for management of OA pain   Lab Results  Component Value Date   CREATININE 1.49 09/28/2017   Lab Results  Component Value Date   NA 141 09/28/2017   K 4.0 09/28/2017   CL 104 09/28/2017   CO2 30 09/28/2017

## 2017-12-03 NOTE — Assessment & Plan Note (Signed)
He has no spinal tenderness to suggest fracture but he dos have osteoporosis.  Muscle strain and spasm appear to be more likely.  meloxicam and tramadol prescribed. If no improvement in a week  Palin films of lumbar spine and hip

## 2017-12-03 NOTE — Assessment & Plan Note (Signed)
he reports compliance with medication regimen  but has an elevated reading today in office.  He has been asked to check his BP at home  and  submit readings for evaluation. Renal function has been  checked today

## 2017-12-04 ENCOUNTER — Other Ambulatory Visit: Payer: Self-pay | Admitting: Internal Medicine

## 2017-12-15 ENCOUNTER — Ambulatory Visit (INDEPENDENT_AMBULATORY_CARE_PROVIDER_SITE_OTHER): Payer: Medicare Other | Admitting: Internal Medicine

## 2017-12-15 ENCOUNTER — Encounter: Payer: Self-pay | Admitting: Internal Medicine

## 2017-12-15 VITALS — BP 142/80 | HR 70 | Temp 97.8°F | Resp 15 | Ht 69.0 in | Wt 182.4 lb

## 2017-12-15 DIAGNOSIS — N182 Chronic kidney disease, stage 2 (mild): Secondary | ICD-10-CM

## 2017-12-15 DIAGNOSIS — M810 Age-related osteoporosis without current pathological fracture: Secondary | ICD-10-CM | POA: Diagnosis not present

## 2017-12-15 DIAGNOSIS — I10 Essential (primary) hypertension: Secondary | ICD-10-CM | POA: Diagnosis not present

## 2017-12-15 DIAGNOSIS — M545 Low back pain, unspecified: Secondary | ICD-10-CM

## 2017-12-15 DIAGNOSIS — Z79899 Other long term (current) drug therapy: Secondary | ICD-10-CM | POA: Diagnosis not present

## 2017-12-15 LAB — BASIC METABOLIC PANEL
BUN: 35 mg/dL — AB (ref 6–23)
CO2: 28 mEq/L (ref 19–32)
Calcium: 9.4 mg/dL (ref 8.4–10.5)
Chloride: 107 mEq/L (ref 96–112)
Creatinine, Ser: 1.72 mg/dL — ABNORMAL HIGH (ref 0.40–1.50)
GFR: 39.96 mL/min — ABNORMAL LOW (ref >60.00)
GLUCOSE: 100 mg/dL — AB (ref 70–99)
POTASSIUM: 4.4 meq/L (ref 3.5–5.1)
SODIUM: 141 meq/L (ref 135–145)

## 2017-12-15 NOTE — Progress Notes (Signed)
Subjective:  Patient ID: Eric Hicks, male    DOB: Sep 03, 1926  Age: 84 y.o. MRN: [id number]  CC: The primary encounter diagnosis was Long-term use of high-risk medication. Diagnoses of CKD (chronic kidney disease), stage II, Acute midline low back pain without sciatica, Essential hypertension, and Age-related osteoporosis without current pathological fracture were also pertinent to this visit.  HPI Eric Hicks presents for follow up on hypertension and back pain of recent onset.  Presumed to be secondary to MSK strain brought on by strenuous activity and a fall.   Back pain is much improved . He has no significant pain and is taking meloxicam on a daily basis,  However.    Hypertension :  He has brought in his home readings for the past two weeks ans all readings < 130/80 and > 100/60    Outpatient Medications Prior to Visit  Medication Sig Dispense Refill  . albuterol (PROVENTIL) (2.5 MG/3ML) 0.083% nebulizer solution Take 3 mLs (2.5 mg total) by nebulization every 6 (six) hours as needed for wheezing or shortness of breath. 150 mL 1  . AMBULATORY NON FORMULARY MEDICATION Joint Advantage Gold 2 tablets daily    . calcium carbonate (OS-CAL) 600 MG TABS Take 600 mg by mouth 2 (two) times daily.    . Cholecalciferol (VITAMIN D3) 400 units tablet Take by mouth 2 (two) times daily.    . dorzolamide-timolol (COSOPT) 22.3-6.8 MG/ML ophthalmic solution TAKE 1 DROP(S) IN RIGHT EYE 2 TIMES A DAY  5  . doxazosin (CARDURA) 2 MG tablet Take 1 tablet (2 mg total) by mouth at bedtime. 30 tablet 5  . finasteride (PROSCAR) 5 MG tablet Take 5 mg by mouth daily.    Marland Kitchen leuprolide, 6 Month, (ELIGARD) 45 MG injection Inject 45 mg into the skin every 6 (six) months.    . meloxicam (MOBIC) 15 MG tablet Take 1 tablet (15 mg total) by mouth daily as needed. 30 tablet 5  . traMADol (ULTRAM) 50 MG tablet Take 1 tablet (50 mg total) by mouth every 8 (eight) hours as needed. 30 tablet 0  . WIXELA INHUB 250-50  MCG/DOSE AEPB INHALE 1 PUFF INTO THE LUNGS TWICE DAILY  1   Facility-Administered Medications Prior to Visit  Medication Dose Route Frequency Provider Last Rate Last Dose  . ipratropium-albuterol (DUONEB) 0.5-2.5 (3) MG/3ML nebulizer solution 3 mL  3 mL Nebulization Q6H Crecencio Mc, MD        Review of Systems;  Patient denies headache, fevers, malaise, unintentional weight loss, skin rash, eye pain, sinus congestion and sinus pain, sore throat, dysphagia,  hemoptysis , cough, dyspnea, wheezing, chest pain, palpitations, orthopnea, edema, abdominal pain, nausea, melena, diarrhea, constipation, flank pain, dysuria, hematuria, urinary  Frequency, nocturia, numbness, tingling, seizures,  Focal weakness, Loss of consciousness,  Tremor, insomnia, depression, anxiety, and suicidal ideation.      Objective:  BP (!) 142/80 (BP Location: Left Arm, Patient Position: Sitting, Cuff Size: Normal)   Pulse 70   Temp 97.8 F (36.6 C) (Oral)   Resp 15   Ht 5\' 9"  (1.753 m)   Wt 182 lb 6.4 oz (82.7 kg)   SpO2 96%   BMI 26.94 kg/m   BP Readings from Last 3 Encounters:  12/15/17 (!) 142/80  12/01/17 (!) 148/100  09/28/17 136/76    Wt Readings from Last 3 Encounters:  12/15/17 182 lb 6.4 oz (82.7 kg)  12/01/17 180 lb 12.8 oz (82 kg)  09/28/17 179 lb (81.2 kg)  General appearance: alert, cooperative and appears stated age Ears: normal TM's and external ear canals both ears Throat: lips, mucosa, and tongue normal; teeth and gums normal Neck: no adenopathy, no carotid bruit, supple, symmetrical, trachea midline and thyroid not enlarged, symmetric, no tenderness/mass/nodules Back: symmetric, no curvature. ROM normal. No CVA tenderness. Lungs: clear to auscultation bilaterally Heart: regular rate and rhythm, S1, S2 normal, no murmur, click, rub or gallop Abdomen: soft, non-tender; bowel sounds normal; no masses,  no organomegaly Pulses: 2+ and symmetric Skin: Skin color, texture, turgor  normal. No rashes or lesions Lymph nodes: Cervical, supraclavicular, and axillary nodes normal.  No results found for: HGBA1C  Lab Results  Component Value Date   CREATININE 1.72 (H) 12/15/2017   CREATININE 1.49 09/28/2017   CREATININE 1.44 03/30/2017    Lab Results  Component Value Date   WBC 6.4 09/28/2017   HGB 13.2 09/28/2017   HCT 38.4 (L) 09/28/2017   PLT 232.0 09/28/2017   GLUCOSE 100 (H) 12/15/2017   CHOL 109 03/30/2017   TRIG 63.0 03/30/2017   HDL 65.80 03/30/2017   LDLCALC 31 03/30/2017   ALT 10 09/28/2017   AST 15 09/28/2017   NA 141 12/15/2017   K 4.4 12/15/2017   CL 107 12/15/2017   CREATININE 1.72 (H) 12/15/2017   Hicks 35 (H) 12/15/2017   CO2 28 12/15/2017   TSH 1.88 03/30/2017   PSA 13.67 (H) 05/15/2014   MICROALBUR <0.7 06/20/2014    Dg Bone Density  Result Date: 06/01/2017 EXAM: DUAL X-RAY ABSORPTIOMETRY (DXA) FOR BONE MINERAL DENSITY IMPRESSION: Dear Dr Derrel Nip, Your patient Eric Hicks completed a BMD test on 06/01/2017 using the [place] (analysis version: 14.10) manufactured by [employer]. The following summarizes the results of our evaluation. PATIENT BIOGRAPHICAL: Name: Eric Hicks, Eric Hicks Patient ID: [id number] Birth Date: 03-Aug-1926 Height: 69.0 in. Gender: Male Exam Date: 06/01/2017 Weight: 180.6 lbs. Indications: Advanced Age, Caucasian, COPD, Height Loss, History of Osteoporosis, Hx. of Prostate Cancer, Osteoporotic Fractures: Treatments: Advair Inhaler, Albuterol, Calcium, Vitamin D ASSESSMENT: The BMD measured at Femur Neck Right is 0.739 g/cm2 with a T-score of -2.5 is low. This patient is considered osteoporotic according to [facility]) criteria. Lumbar spine was not used due to advance degenerative changes. Site Region Measured Measured WHO Young Adult BMD Date       Age      Classification T-score DualFemur Neck Right 06/01/2017 88.7 Osteoporosis -2.5 0.739 g/cm2 DualFemur Neck Right 12/22/2011 83.3 Osteopenia -2.2 0.784  g/cm2 DualFemur Neck Right 02/06/2008 79.4 Osteoporosis -2.6 0.731 g/cm2 DualFemur Total Mean 06/01/2017 88.7 Osteopenia -1.7 0.863 g/cm2 DualFemur Total Mean 12/22/2011 83.3 Osteopenia -1.6 0.871 g/cm2 DualFemur Total Mean 02/06/2008 79.4 Osteopenia -1.9 0.834 g/cm2 Left Forearm Radius 33% 06/01/2017 88.7 Normal 0.1 0.995 g/cm2 Left Forearm Radius 33% 12/22/2011 83.3 Normal 1.7 1.158 g/cm2 Left Forearm Radius 33% 02/06/2008 79.4 Normal 0.9 1.079 g/cm2 World Health Organization [facility]) criteria for post-menopausal, Caucasian Women: Normal:       T-score at or above -1 SD Osteopenia:   T-score between -1 and -2.5 SD Osteoporosis: T-score at or below -2.5 SD RECOMMENDATIONS: [place] recommends that FDA-approved medical therapies be considered in postmenopausal women and men age 37 or older with a: 1. Hip or vertebral (clinical or morphometric) fracture. 2. T-score of < -2.5 at the spine or hip. 3. Ten-year fracture probability by FRAX of 3% or greater for hip fracture or 20% or greater for major osteoporotic fracture. All treatment decisions require  clinical judgment and consideration of individual patient factors, including patient preferences, co-morbidities, previous drug use, risk factors not captured in the FRAX model (e.g. falls, vitamin D deficiency, increased bone turnover, interval significant decline in bone density) and possible under - or over-estimation of fracture risk by FRAX. All patients should ensure an adequate intake of dietary calcium (1200 mg/d) and vitamin D (800 IU daily) unless contraindicated. FOLLOW-UP: People with diagnosed cases of osteoporosis or osteopenia should be regularly tested for bone mineral density. For patients eligible for Medicare, routine testing is allowed once every 2 years. The testing frequency can be increased to one year for patients who have rapidly progressing disease, or for those who are receiving medical therapy to restore bone mass. I have  reviewed this report, and agree with the above findings. [facility] Radiology Electronically Signed   By: Rolm Baptise M.D.   On: 06/01/2017 11:48    Assessment & Plan:   Problem List Items Addressed This Visit    Osteoporosis    Secondary to Lupron. No significant improvement after nearly ten years of alendronate.  No history of fracture . Drug holiday recommended for one year, and repeat DEXA in one year to look for evidence of improvement.       CKD (chronic kidney disease), stage II    His GFR has dropped since he started using meloxicam.   He has a history of an adverse reaction to tylenol .  He does not want tramadol. Will advise home to reduce use of meloxicam to as needed. He is aware of the risks.       Hypertension    Well controlled on current regimen. Renal function has declined slightly due to daily use of NSAID,   Lab Results  Component Value Date   CREATININE 1.72 (H) 12/15/2017   Lab Results  Component Value Date   NA 141 12/15/2017   K 4.4 12/15/2017   CL 107 12/15/2017   CO2 28 12/15/2017         Low back pain    Improving  Aggravated by unusually strenuous activity that resulted in a fall.  He has osteoporosis but had no spinal tenderness on exam so x rays were deferred        Other Visit Diagnoses    Long-term use of high-risk medication    -  Primary   Relevant Orders   Basic metabolic panel (Completed)      I am having Eric Hicks maintain his calcium carbonate, AMBULATORY NON FORMULARY MEDICATION, finasteride, albuterol, WIXELA INHUB, Vitamin D3, dorzolamide-timolol, doxazosin, leuprolide (6 Month), meloxicam, and traMADol. We will continue to administer ipratropium-albuterol.  No orders of the defined types were placed in this encounter.   There are no discontinued medications.  Follow-up: No follow-ups on file.   Crecencio Mc, MD

## 2017-12-15 NOTE — Patient Instructions (Signed)
Your blood pressure readings are excellent .  D not change anything!   You can continue using the meloxicam as needed INSTEAD OF ALEVE   ICE AND HEAT ARE BOTH HELPFUL:  ICE AFTER INJURY AND AT THE END OF THE DAY  HEAT IN THE MORNING OR AFTER REST FOR OLD INJURIES

## 2017-12-17 ENCOUNTER — Other Ambulatory Visit: Payer: Self-pay | Admitting: Internal Medicine

## 2017-12-17 NOTE — Assessment & Plan Note (Addendum)
His GFR has dropped since he started using meloxicam.   He has a history of an adverse reaction to tylenol .  He does not want tramadol. Will advise home to reduce use of meloxicam to as needed. He is aware of the risks.

## 2017-12-17 NOTE — Assessment & Plan Note (Addendum)
Secondary to Lupron. No significant improvement after nearly ten years of alendronate.  No history of fracture . Drug holiday recommended for one year, and repeat DEXA in one year to look for evidence of improvement.

## 2017-12-17 NOTE — Assessment & Plan Note (Signed)
Improving  Aggravated by unusually strenuous activity that resulted in a fall.  He has osteoporosis but had no spinal tenderness on exam so x rays were deferred

## 2017-12-17 NOTE — Assessment & Plan Note (Signed)
Well controlled on current regimen. Renal function has declined slightly due to daily use of NSAID,   Lab Results  Component Value Date   CREATININE 1.72 (H) 12/15/2017   Lab Results  Component Value Date   NA 141 12/15/2017   K 4.4 12/15/2017   CL 107 12/15/2017   CO2 28 12/15/2017

## 2017-12-30 MED ORDER — WIXELA INHUB 250-50 MCG/DOSE IN AEPB: INHALATION_SPRAY | RESPIRATORY_TRACT | 11 refills | Status: DC

## 2017-12-30 NOTE — Telephone Encounter (Signed)
It's another name for Advair.  Med refilled

## 2018-02-22 DIAGNOSIS — H401112 Primary open-angle glaucoma, right eye, moderate stage: Secondary | ICD-10-CM | POA: Diagnosis not present

## 2018-02-27 DIAGNOSIS — H401112 Primary open-angle glaucoma, right eye, moderate stage: Secondary | ICD-10-CM | POA: Diagnosis not present

## 2018-03-30 ENCOUNTER — Encounter: Payer: Self-pay | Admitting: Internal Medicine

## 2018-03-30 ENCOUNTER — Ambulatory Visit (INDEPENDENT_AMBULATORY_CARE_PROVIDER_SITE_OTHER): Payer: Medicare Other | Admitting: Internal Medicine

## 2018-03-30 DIAGNOSIS — C61 Malignant neoplasm of prostate: Secondary | ICD-10-CM

## 2018-03-30 DIAGNOSIS — I10 Essential (primary) hypertension: Secondary | ICD-10-CM | POA: Diagnosis not present

## 2018-03-30 DIAGNOSIS — J432 Centrilobular emphysema: Secondary | ICD-10-CM

## 2018-03-30 DIAGNOSIS — N182 Chronic kidney disease, stage 2 (mild): Secondary | ICD-10-CM | POA: Diagnosis not present

## 2018-03-30 NOTE — Patient Instructions (Addendum)
You are doing extremely well!   Continue to check your BP at home once a week and let me know if readings begin to stay > 130/80

## 2018-03-30 NOTE — Progress Notes (Signed)
Subjective:  Patient ID: Eric Hicks, male    DOB: 02-27-1927  Age: 84 y.o. MRN: [id number]  CC: Diagnoses of CKD (chronic kidney disease), stage II, Centrilobular emphysema (Eric Hicks), Essential hypertension, and Prostate cancer (Eric Hicks) were pertinent to this visit.  HPI Eric Hicks presents for 6 month follow up on hypertension and COPD.     History of  prostate CA diagnosed in 2017.   Receiving Lupron .  Had a good report from urology psa was < 0.1  6 month follow up planned .  Discussed results and prognosis with patient per his request   Increased productive cough in morning. Non purulent.  \  Wants  To resume walking    No longer using  meloxicam   Checks bp at home on a weekly basis  < 120/70   Outpatient Medications Prior to Visit  Medication Sig Dispense Refill  . albuterol (PROVENTIL) (2.5 MG/3ML) 0.083% nebulizer solution Take 3 mLs (2.5 mg total) by nebulization every 6 (six) hours as needed for wheezing or shortness of breath. 150 mL 1  . AMBULATORY NON FORMULARY MEDICATION Joint Advantage Gold 2 tablets daily    . calcium carbonate (OS-CAL) 600 MG TABS Take 600 mg by mouth 2 (two) times daily.    . Cholecalciferol (VITAMIN D3) 400 units tablet Take by mouth 2 (two) times daily.    . dorzolamide-timolol (COSOPT) 22.3-6.8 MG/ML ophthalmic solution TAKE 1 DROP(S) IN RIGHT EYE 2 TIMES A DAY  5  . doxazosin (CARDURA) 2 MG tablet Take 1 tablet (2 mg total) by mouth at bedtime. 30 tablet 5  . finasteride (PROSCAR) 5 MG tablet Take 5 mg by mouth daily.    Marland Kitchen latanoprost (XALATAN) 0.005 % ophthalmic solution Place 1 drop into both eyes at bedtime.  5  . leuprolide, 6 Month, (ELIGARD) 45 MG injection Inject 45 mg into the skin every 6 (six) months.    . meloxicam (MOBIC) 15 MG tablet Take 1 tablet (15 mg total) by mouth daily as needed. 30 tablet 5  . WIXELA INHUB 250-50 MCG/DOSE AEPB INHALE 1 PUFF INTO THE LUNGS TWICE DAILY 60 each 11  . traMADol (ULTRAM) 50 MG tablet Take 1 tablet  (50 mg total) by mouth every 8 (eight) hours as needed. (Patient not taking: Reported on 03/30/2018) 30 tablet 0   Facility-Administered Medications Prior to Visit  Medication Dose Route Frequency Provider Last Rate Last Dose  . ipratropium-albuterol (DUONEB) 0.5-2.5 (3) MG/3ML nebulizer solution 3 mL  3 mL Nebulization Q6H Eric Mc, MD        Review of Systems;  Patient denies headache, fevers, malaise, unintentional weight loss, skin rash, eye pain, sinus congestion and sinus pain, sore throat, dysphagia,  hemoptysis , cough, dyspnea, wheezing, chest pain, palpitations, orthopnea, edema, abdominal pain, nausea, melena, diarrhea, constipation, flank pain, dysuria, hematuria, urinary  Frequency, nocturia, numbness, tingling, seizures,  Focal weakness, Loss of consciousness,  Tremor, insomnia, depression, anxiety, and suicidal ideation.      Objective:  BP (!) 150/90 (BP Location: Left Arm, Patient Position: Sitting, Cuff Size: Normal)   Pulse (!) 48   Temp 97.6 F (36.4 C) (Oral)   Resp 17   Ht 5\' 9"  (1.753 m)   Wt 179 lb 12.8 oz (81.6 kg)   SpO2 94%   BMI 26.55 kg/m   BP Readings from Last 3 Encounters:  03/30/18 (!) 150/90  12/15/17 (!) 142/80  12/01/17 (!) 148/100    Wt Readings from Last 3  Encounters:  03/30/18 179 lb 12.8 oz (81.6 kg)  12/15/17 182 lb 6.4 oz (82.7 kg)  12/01/17 180 lb 12.8 oz (82 kg)    General appearance: alert, cooperative and appears stated age Ears: normal TM's and external ear canals both ears Throat: lips, mucosa, and tongue normal; teeth and gums normal Neck: no adenopathy, no carotid bruit, supple, symmetrical, trachea midline and thyroid not enlarged, symmetric, no tenderness/mass/nodules Back: symmetric, no curvature. ROM normal. No CVA tenderness. Lungs: clear to auscultation bilaterally Heart: regular rate and rhythm, S1, S2 normal, no murmur, click, rub or gallop Abdomen: soft, non-tender; bowel sounds normal; no masses,  no  organomegaly Pulses: 2+ and symmetric Skin: Skin color, texture, turgor normal. No rashes or lesions Lymph nodes: Cervical, supraclavicular, and axillary nodes normal.  No results found for: HGBA1C  Lab Results  Component Value Date   CREATININE 1.72 (H) 12/15/2017   CREATININE 1.49 09/28/2017   CREATININE 1.44 03/30/2017    Lab Results  Component Value Date   WBC 6.4 09/28/2017   HGB 13.2 09/28/2017   HCT 38.4 (L) 09/28/2017   PLT 232.0 09/28/2017   GLUCOSE 100 (H) 12/15/2017   CHOL 109 03/30/2017   TRIG 63.0 03/30/2017   HDL 65.80 03/30/2017   LDLCALC 31 03/30/2017   ALT 10 09/28/2017   AST 15 09/28/2017   NA 141 12/15/2017   K 4.4 12/15/2017   CL 107 12/15/2017   CREATININE 1.72 (H) 12/15/2017   BUN 35 (H) 12/15/2017   CO2 28 12/15/2017   TSH 1.88 03/30/2017   PSA 13.67 (H) 05/15/2014   MICROALBUR <0.7 06/20/2014    Dg Bone Density  Result Date: 06/01/2017 EXAM: DUAL X-RAY ABSORPTIOMETRY (DXA) FOR BONE MINERAL DENSITY IMPRESSION: Dear Dr Eric Hicks, Your patient Eric Hicks completed a BMD test on 06/01/2017 using the [employer] (analysis version: 14.10) manufactured by [employer]. The following summarizes the results of our evaluation. PATIENT BIOGRAPHICAL: Name: Eric Hicks Patient ID: [id number] Birth Date: 1926-12-15 Height: 69.0 in. Gender: Male Exam Date: 06/01/2017 Weight: 180.6 lbs. Indications: Advanced Age, Caucasian, COPD, Height Loss, History of Osteoporosis, Hx. of Prostate Cancer, Osteoporotic Fractures: Treatments: Advair Inhaler, Albuterol, Calcium, Vitamin D ASSESSMENT: The BMD measured at Femur Neck Right is 0.739 g/cm2 with a T-score of -2.5 is low. This patient is considered osteoporotic according to [facility]) criteria. Lumbar spine was not used due to advance degenerative changes. Site Region Measured Measured WHO Young Adult BMD Date       Age      Classification T-score DualFemur Neck Right 06/01/2017 88.7 Osteoporosis  -2.5 0.739 g/cm2 DualFemur Neck Right 12/22/2011 83.3 Osteopenia -2.2 0.784 g/cm2 DualFemur Neck Right 02/06/2008 79.4 Osteoporosis -2.6 0.731 g/cm2 DualFemur Total Mean 06/01/2017 88.7 Osteopenia -1.7 0.863 g/cm2 DualFemur Total Mean 12/22/2011 83.3 Osteopenia -1.6 0.871 g/cm2 DualFemur Total Mean 02/06/2008 79.4 Osteopenia -1.9 0.834 g/cm2 Left Forearm Radius 33% 06/01/2017 88.7 Normal 0.1 0.995 g/cm2 Left Forearm Radius 33% 12/22/2011 83.3 Normal 1.7 1.158 g/cm2 Left Forearm Radius 33% 02/06/2008 79.4 Normal 0.9 1.079 g/cm2 World Health Organization [facility]) criteria for post-menopausal, Caucasian Women: Normal:       T-score at or above -1 SD Osteopenia:   T-score between -1 and -2.5 SD Osteoporosis: T-score at or below -2.5 SD RECOMMENDATIONS: [place] recommends that FDA-approved medical therapies be considered in postmenopausal women and men age 49 or older with a: 1. Hip or vertebral (clinical or morphometric) fracture. 2. T-score of < -2.5 at the  spine or hip. 3. Ten-year fracture probability by FRAX of 3% or greater for hip fracture or 20% or greater for major osteoporotic fracture. All treatment decisions require clinical judgment and consideration of individual patient factors, including patient preferences, co-morbidities, previous drug use, risk factors not captured in the FRAX model (e.g. falls, vitamin D deficiency, increased bone turnover, interval significant decline in bone density) and possible under - or over-estimation of fracture risk by FRAX. All patients should ensure an adequate intake of dietary calcium (1200 mg/d) and vitamin D (800 IU daily) unless contraindicated. FOLLOW-UP: People with diagnosed cases of osteoporosis or osteopenia should be regularly tested for bone mineral density. For patients eligible for Medicare, routine testing is allowed once every 2 years. The testing frequency can be increased to one year for patients who have rapidly progressing disease,  or for those who are receiving medical therapy to restore bone mass. I have reviewed this report, and agree with the above findings. [facility] Radiology Electronically Signed   By: Rolm Baptise M.D.   On: 06/01/2017 11:48    Assessment & Plan:   Problem List Items Addressed This Visit    CKD (chronic kidney disease), stage II    His GFR had dropped mwhen  he started using meloxicam.   He has a history of an adverse reaction to tylenol .  He does not want tramadol. He is no longer using meloxicam  Chronically,  But only as needed. HE  is aware of the risks.   Lab Results  Component Value Date   NA 141 12/15/2017   K 4.4 12/15/2017   CL 107 12/15/2017   CO2 28 12/15/2017   Lab Results  Component Value Date   CREATININE 1.72 (H) 12/15/2017         COPD (chronic obstructive pulmonary disease) with emphysema ([place])    Patient notes rare dyspnea with extreme exertion. Reports this is stable and his COPD is stable. Discussed monitoring this and if there is any increased frequency or progression he should be reevaluated. He'll continue his current medications.      Hypertension    Well controlled on current regimen. Renal function sTABLE NO CHANGES TODAY  Lab Results  Component Value Date   CREATININE 1.72 (H) 12/15/2017   Lab Results  Component Value Date   NA 141 12/15/2017   K 4.4 12/15/2017   CL 107 12/15/2017   CO2 28 12/15/2017         Prostate cancer ([place])    Receiving Lupron  2nd dose,  PSA is zero            I have discontinued [name] C. Katzman's traMADol. I am also having him maintain his calcium carbonate, AMBULATORY NON FORMULARY MEDICATION, finasteride, albuterol, Vitamin D3, dorzolamide-timolol, doxazosin, leuprolide (6 Month), meloxicam, WIXELA INHUB, and latanoprost. We will continue to administer ipratropium-albuterol.  No orders of the defined types were placed in this encounter.   Medications Discontinued During This Encounter  Medication Reason  .  traMADol (ULTRAM) 50 MG tablet Patient has not taken in last 30 days    Follow-up: Return in about 6 months (around 09/29/2018).   Eric Mc, MD

## 2018-04-01 NOTE — Assessment & Plan Note (Signed)
His GFR had dropped mwhen  he started using meloxicam.   He has a history of an adverse reaction to tylenol .  He does not want tramadol. He is no longer using meloxicam  Chronically,  But only as needed. HE  is aware of the risks.   Lab Results  Component Value Date   NA 141 12/15/2017   K 4.4 12/15/2017   CL 107 12/15/2017   CO2 28 12/15/2017   Lab Results  Component Value Date   CREATININE 1.72 (H) 12/15/2017

## 2018-04-01 NOTE — Assessment & Plan Note (Signed)
Patient notes rare dyspnea with extreme exertion. Reports this is stable and his COPD is stable. Discussed monitoring this and if there is any increased frequency or progression he should be reevaluated. He'll continue his current medications.

## 2018-04-01 NOTE — Assessment & Plan Note (Addendum)
Well controlled on current regimen. Renal function sTABLE NO CHANGES TODAY  Lab Results  Component Value Date   CREATININE 1.72 (H) 12/15/2017   Lab Results  Component Value Date   NA 141 12/15/2017   K 4.4 12/15/2017   CL 107 12/15/2017   CO2 28 12/15/2017

## 2018-04-01 NOTE — Assessment & Plan Note (Signed)
Receiving Lupron  2nd dose,  PSA is zero

## 2018-04-04 DIAGNOSIS — H401112 Primary open-angle glaucoma, right eye, moderate stage: Secondary | ICD-10-CM | POA: Diagnosis not present

## 2018-04-05 ENCOUNTER — Other Ambulatory Visit: Payer: Self-pay | Admitting: Internal Medicine

## 2018-05-18 ENCOUNTER — Other Ambulatory Visit: Payer: Self-pay | Admitting: Internal Medicine

## 2018-05-29 DIAGNOSIS — H6982 Other specified disorders of Eustachian tube, left ear: Secondary | ICD-10-CM | POA: Diagnosis not present

## 2018-05-29 DIAGNOSIS — H6122 Impacted cerumen, left ear: Secondary | ICD-10-CM | POA: Diagnosis not present

## 2018-06-14 ENCOUNTER — Encounter: Payer: Self-pay | Admitting: Internal Medicine

## 2018-06-14 ENCOUNTER — Other Ambulatory Visit: Payer: Self-pay | Admitting: Internal Medicine

## 2018-06-14 ENCOUNTER — Ambulatory Visit: Payer: Medicare Other

## 2018-06-14 ENCOUNTER — Ambulatory Visit (INDEPENDENT_AMBULATORY_CARE_PROVIDER_SITE_OTHER): Payer: Medicare Other | Admitting: Internal Medicine

## 2018-06-14 ENCOUNTER — Telehealth: Payer: Self-pay | Admitting: Internal Medicine

## 2018-06-14 ENCOUNTER — Ambulatory Visit (INDEPENDENT_AMBULATORY_CARE_PROVIDER_SITE_OTHER): Payer: Medicare Other

## 2018-06-14 VITALS — BP 150/90 | HR 65 | Temp 98.0°F | Ht 69.0 in | Wt 183.2 lb

## 2018-06-14 DIAGNOSIS — I509 Heart failure, unspecified: Secondary | ICD-10-CM

## 2018-06-14 DIAGNOSIS — I499 Cardiac arrhythmia, unspecified: Secondary | ICD-10-CM

## 2018-06-14 DIAGNOSIS — R0602 Shortness of breath: Secondary | ICD-10-CM

## 2018-06-14 DIAGNOSIS — Z13220 Encounter for screening for lipoid disorders: Secondary | ICD-10-CM

## 2018-06-14 DIAGNOSIS — R9431 Abnormal electrocardiogram [ECG] [EKG]: Secondary | ICD-10-CM

## 2018-06-14 DIAGNOSIS — I10 Essential (primary) hypertension: Secondary | ICD-10-CM

## 2018-06-14 DIAGNOSIS — R0789 Other chest pain: Secondary | ICD-10-CM

## 2018-06-14 DIAGNOSIS — R059 Cough, unspecified: Secondary | ICD-10-CM

## 2018-06-14 DIAGNOSIS — R05 Cough: Secondary | ICD-10-CM

## 2018-06-14 DIAGNOSIS — J449 Chronic obstructive pulmonary disease, unspecified: Secondary | ICD-10-CM

## 2018-06-14 LAB — COMPREHENSIVE METABOLIC PANEL
ALBUMIN: 3.6 g/dL (ref 3.5–5.2)
ALK PHOS: 57 U/L (ref 39–117)
ALT: 11 U/L (ref 0–53)
AST: 16 U/L (ref 0–37)
BUN: 28 mg/dL — ABNORMAL HIGH (ref 6–23)
CHLORIDE: 109 meq/L (ref 96–112)
CO2: 28 mEq/L (ref 19–32)
Calcium: 8.8 mg/dL (ref 8.4–10.5)
Creatinine, Ser: 1.59 mg/dL — ABNORMAL HIGH (ref 0.40–1.50)
GFR: 41.12 mL/min — AB (ref >60.00)
Glucose, Bld: 93 mg/dL (ref 70–99)
POTASSIUM: 4.3 meq/L (ref 3.5–5.1)
SODIUM: 145 meq/L (ref 135–145)
TOTAL PROTEIN: 5.6 g/dL — AB (ref 6.0–8.3)
Total Bilirubin: 0.7 mg/dL (ref 0.2–1.2)

## 2018-06-14 LAB — CBC WITH DIFFERENTIAL/PLATELET
BASOS PCT: 1.1 % (ref 0.0–3.0)
Basophils Absolute: 0.1 10*3/uL (ref 0.0–0.1)
EOS PCT: 4.4 % (ref 0.0–5.0)
Eosinophils Absolute: 0.2 10*3/uL (ref 0.0–0.7)
HEMATOCRIT: 37.9 % — AB (ref 39.0–52.0)
HEMOGLOBIN: 12.8 g/dL — AB (ref 13.0–17.0)
Lymphocytes Relative: 23.5 % (ref 12.0–46.0)
Lymphs Abs: 1.3 10*3/uL (ref 0.7–4.0)
MCHC: 33.7 g/dL (ref 30.0–36.0)
MCV: 95.5 fl (ref 78.0–100.0)
MONOS PCT: 11 % (ref 3.0–12.0)
Monocytes Absolute: 0.6 10*3/uL (ref 0.1–1.0)
NEUTROS PCT: 60 % (ref 43.0–77.0)
Neutro Abs: 3.2 10*3/uL (ref 1.4–7.7)
Platelets: 170 10*3/uL (ref 150.0–400.0)
RBC: 3.97 Mil/uL — AB (ref 4.22–5.81)
RDW: 14.2 % (ref 11.5–15.5)
WBC: 5.4 10*3/uL (ref 4.0–10.5)

## 2018-06-14 LAB — LIPID PANEL
Cholesterol: 103 mg/dL (ref 0–200)
HDL: 64.3 mg/dL (ref >39.00)
LDL Cholesterol: 32 mg/dL (ref 0–99)
NonHDL: 38.73
Total CHOL/HDL Ratio: 2
Triglycerides: 36 mg/dL (ref 0.0–149.0)
VLDL: 7.2 mg/dL (ref 0.0–40.0)

## 2018-06-14 LAB — TSH: TSH: 1.69 u[IU]/mL (ref 0.35–4.50)

## 2018-06-14 LAB — TROPONIN I: Troponin I: 0.09 ng/mL (ref 0.00–0.04)

## 2018-06-14 MED ORDER — IPRATROPIUM-ALBUTEROL 0.5-2.5 (3) MG/3ML IN SOLN: 3.0000 mL | Freq: Four times a day (QID) | RESPIRATORY_TRACT | 11 refills | Status: DC | PRN

## 2018-06-14 MED ORDER — FUROSEMIDE 20 MG PO TABS: 20.0000 mg | ORAL_TABLET | Freq: Every day | ORAL | 0 refills | Status: DC | PRN

## 2018-06-14 NOTE — Telephone Encounter (Signed)
Santiago Glad, Customer Service Rep with [employer] called a critical Troponin I 0.09, result read back and verified. Kerin Salen, LPN notified, will route to office.

## 2018-06-14 NOTE — Telephone Encounter (Signed)
Critical troponin 0.09

## 2018-06-14 NOTE — Progress Notes (Signed)
Pre visit review using our clinic review tool, if applicable. No additional management support is needed unless otherwise documented below in the visit note. 

## 2018-06-14 NOTE — Telephone Encounter (Signed)
We need to order echo asap I am concern about heart failure   [place]

## 2018-06-14 NOTE — Progress Notes (Signed)
No chief complaint on file.  Acute visit  1. C/o worsening sob x 2 weeks dry cough, chest tightness, wheezing at times former smoker x 8 years max 2/3 ppd exposed to 2nd hand via sons smoking using albuterol and Wixela 250/50 bid. SOB with exertion. He has h/o COPD 2. HTN and CKD was on HCTZ, ARB in the past now on Cardura 2 mg only BP readings per pt less at home 3. Irregular HR noted on exam today EKG done    Review of Systems  Constitutional: Negative for weight loss.  HENT: Negative for hearing loss.   Eyes: Negative for blurred vision.  Respiratory: Negative for shortness of breath.   Cardiovascular:       Chest tightness    Skin: Negative for rash.  Psychiatric/Behavioral: Negative for memory loss.   Past Medical History:  Diagnosis Date  . BPH (benign prostatic hyperplasia)    managed by Olena Heckle  . COPD (chronic obstructive pulmonary disease) ([place])   . Elevated PSA, between 10 and less than 20 ng/ml    Olena Heckle, watchful waiting  . Hypertension   . Osteoporosis    by DEXA   Past Surgical History:  Procedure Laterality Date  . TYMPANOSTOMY TUBE PLACEMENT  2017   Family History  Problem Relation Age of Onset  . Diabetes Mother   . Hypertension Father   . Cancer Brother 64       multiple myeloma   Social History   Socioeconomic History  . Marital status: Married    Spouse name: Not on file  . Number of children: Not on file  . Years of education: Not on file  . Highest education level: Not on file  Occupational History  . Not on file  Social Needs  . Financial resource strain: Not hard at all  . Food insecurity:    Worry: Never true    Inability: Never true  . Transportation needs:    Medical: No    Non-medical: No  Tobacco Use  . Smoking status: Former Research scientist (life sciences)  . Smokeless tobacco: Never Used  Substance and Sexual Activity  . Alcohol use: No  . Drug use: Not on file  . Sexual activity: Never  Lifestyle  . Physical activity:    Days per week: Not  on file    Minutes per session: Not on file  . Stress: Not at all  Relationships  . Social connections:    Talks on phone: Not on file    Gets together: Not on file    Attends religious service: Not on file    Active member of club or organization: Not on file    Attends meetings of clubs or organizations: Not on file    Relationship status: Not on file  . Intimate partner violence:    Fear of current or ex partner: No    Emotionally abused: No    Physically abused: No    Forced sexual activity: No  Other Topics Concern  . Not on file  Social History Narrative   Regular exercise-yew   No outpatient medications have been marked as taking for the 06/14/18 encounter (Appointment) with McLean-Scocuzza, Nino Glow, MD.   Current Facility-Administered Medications for the 06/14/18 encounter (Appointment) with McLean-Scocuzza, Nino Glow, MD  Medication  . ipratropium-albuterol (DUONEB) 0.5-2.5 (3) MG/3ML nebulizer solution 3 mL   Allergies  Allergen Reactions  . Tylenol [Acetaminophen] Rash   No results found for this or any previous visit (from the past 2160 hour(s)).  Objective  There is no height or weight on file to calculate BMI. Wt Readings from Last 3 Encounters:  03/30/18 179 lb 12.8 oz (81.6 kg)  12/15/17 182 lb 6.4 oz (82.7 kg)  12/01/17 180 lb 12.8 oz (82 kg)   Temp Readings from Last 3 Encounters:  03/30/18 97.6 F (36.4 C) (Oral)  12/15/17 97.8 F (36.6 C) (Oral)  12/01/17 97.9 F (36.6 C) (Oral)   BP Readings from Last 3 Encounters:  03/30/18 (!) 150/90  12/15/17 (!) 142/80  12/01/17 (!) 148/100   Pulse Readings from Last 3 Encounters:  03/30/18 (!) 48  12/15/17 70  12/01/17 87    Physical Exam Vitals signs and nursing note reviewed.  Constitutional:      Appearance: Normal appearance. He is well-developed and well-groomed.  HENT:     Head: Normocephalic and atraumatic.     Nose: Nose normal.     Mouth/Throat:     Mouth: Mucous membranes are moist.      Pharynx: Oropharynx is clear.  Eyes:     Conjunctiva/sclera: Conjunctivae normal.     Pupils: Pupils are equal, round, and reactive to light.  Cardiovascular:     Rate and Rhythm: Normal rate. Rhythm irregular.     Heart sounds: Normal heart sounds. No murmur.  Pulmonary:     Effort: Pulmonary effort is normal.     Breath sounds: Normal breath sounds. No wheezing.  Skin:    General: Skin is warm and dry.  Neurological:     General: No focal deficit present.     Mental Status: He is alert and oriented to person, place, and time. Mental status is at baseline.     Gait: Gait normal.  Psychiatric:        Attention and Perception: Attention and perception normal.        Mood and Affect: Mood and affect normal.        Speech: Speech normal.        Behavior: Behavior normal. Behavior is cooperative.        Thought Content: Thought content normal.        Cognition and Memory: Cognition and memory normal.        Judgment: Judgment normal.     Assessment   1. Worsening sob, chest tightness, wheezing with h/o COPD no sig wheezing on exam r/o cardiac etiology does not seem like COPD exacerbation  2. HTN 3. CKD  4. Irregular HR with TWI on EKG 5. HM Plan   1.  duoneb Hold Wixela given same Trelegy  CXR today, EKG, stat trop  Consider CT chest if CXR unrevealing Echo ordered  2. Cont meds  F/u with PCP consider add another med for control in future  3. Labs today cmet, cbc, lipid, TSH, stat trop for #1-4  4. See above  5. utd all vaccines except new shingrix    Provider: Dr. Olivia Mackie McLean-Scocuzza-Internal Medicine

## 2018-06-14 NOTE — Telephone Encounter (Signed)
See result note.  

## 2018-06-14 NOTE — Telephone Encounter (Signed)
If chest tightness worsens advise pt to go to hospital today Heart enzyme elevated I am worried about his heart causing sob, we are trying to order echo asap  [place]

## 2018-06-14 NOTE — Patient Instructions (Addendum)
Chronic Obstructive Pulmonary Disease Chronic obstructive pulmonary disease (COPD) is a long-term (chronic) lung problem. When you have COPD, it is hard for air to get in and out of your lungs. Usually the condition gets worse over time, and your lungs will never return to normal. There are things you can do to keep yourself as healthy as possible.  Your doctor may treat your condition with: ? Medicines. ? Oxygen. ? Lung surgery.  Your doctor may also recommend: ? Rehabilitation. This includes steps to make your body work better. It may involve a team of specialists. ? Quitting smoking, if you smoke. ? Exercise and changes to your diet. ? Comfort measures (palliative care). Follow these instructions at home: Medicines  Take over-the-counter and prescription medicines only as told by your doctor.  Talk to your doctor before taking any cough or allergy medicines. You may need to avoid medicines that cause your lungs to be dry. Lifestyle  If you smoke, stop. Smoking makes the problem worse. If you need help quitting, ask your doctor.  Avoid being around things that make your breathing worse. This may include smoke, chemicals, and fumes.  Stay active, but remember to rest as well.  Learn and use tips on how to relax.  Make sure you get enough sleep. Most adults need at least 7 hours of sleep every night.  Eat healthy foods. Eat smaller meals more often. Rest before meals. Controlled breathing Learn and use tips on how to control your breathing as told by your doctor. Try:  Breathing in (inhaling) through your nose for 1 second. Then, pucker your lips and breath out (exhale) through your lips for 2 seconds.  Putting one hand on your belly (abdomen). Breathe in slowly through your nose for 1 second. Your hand on your belly should move out. Pucker your lips and breathe out slowly through your lips. Your hand on your belly should move in as you breathe out.  Controlled coughing Learn  and use controlled coughing to clear mucus from your lungs. Follow these steps: 1. Lean your head a little forward. 2. Breathe in deeply. 3. Try to hold your breath for 3 seconds. 4. Keep your mouth slightly open while coughing 2 times. 5. Spit any mucus out into a tissue. 6. Rest and do the steps again 1 or 2 times as needed. General instructions  Make sure you get all the shots (vaccines) that your doctor recommends. Ask your doctor about a flu shot and a pneumonia shot.  Use oxygen therapy and pulmonary rehabilitation if told by your doctor. If you need home oxygen therapy, ask your doctor if you should buy a tool to measure your oxygen level (oximeter).  Make a COPD action plan with your doctor. This helps you to know what to do if you feel worse than usual.  Manage any other conditions you have as told by your doctor.  Avoid going outside when it is very hot, cold, or humid.  Avoid people who have a sickness you can catch (contagious).  Keep all follow-up visits as told by your doctor. This is important. Contact a doctor if:  You cough up more mucus than usual.  There is a change in the color or thickness of the mucus.  It is harder to breathe than usual.  Your breathing is faster than usual.  You have trouble sleeping.  You need to use your medicines more often than usual.  You have trouble doing your normal activities such as getting dressed   or walking around the house. Get help right away if:  You have shortness of breath while resting.  You have shortness of breath that stops you from: ? Being able to talk. ? Doing normal activities.  Your chest hurts for longer than 5 minutes.  Your skin color is more blue than usual.  Your pulse oximeter shows that you have low oxygen for longer than 5 minutes.  You have a fever.  You feel too tired to breathe normally. Summary  Chronic obstructive pulmonary disease (COPD) is a long-term lung problem.  The way your  lungs work will never return to normal. Usually the condition gets worse over time. There are things you can do to keep yourself as healthy as possible.  Take over-the-counter and prescription medicines only as told by your doctor.  If you smoke, stop. Smoking makes the problem worse. This information is not intended to replace advice given to you by your health care provider. Make sure you discuss any questions you have with your health care provider. Document Released: 09/29/2007 Document Revised: 05/17/2016 Document Reviewed: 05/17/2016 Elsevier Interactive Patient Education  2019 [place] of Breath, Adult Shortness of breath is when a person has trouble breathing enough air or when a person feels like she or he is having trouble breathing in enough air. Shortness of breath could be a sign of a medical problem. Follow these instructions at home:   Pay attention to any changes in your symptoms.  Do not use any products that contain nicotine or tobacco, such as cigarettes, e-cigarettes, and chewing tobacco.  Do not smoke. Smoking is a common cause of shortness of breath. If you need help quitting, ask your health care provider.  Avoid things that can irritate your airways, such as: ? Mold. ? Dust. ? Air pollution. ? Chemical fumes. ? Things that can cause allergy symptoms (allergens), if you have allergies.  Keep your living space clean and free of mold and dust.  Rest as needed. Slowly return to your usual activities.  Take over-the-counter and prescription medicines only as told by your health care provider. This includes oxygen therapy and inhaled medicines.  Keep all follow-up visits as told by your health care provider. This is important. Contact a health care provider if:  Your condition does not improve as soon as expected.  You have a hard time doing your normal activities, even after you rest.  You have new symptoms. Get help right away if:  Your  shortness of breath gets worse.  You have shortness of breath when you are resting.  You feel light-headed or you faint.  You have a cough that is not controlled with medicines.  You cough up blood.  You have pain with breathing.  You have pain in your chest, arms, shoulders, or abdomen.  You have a fever.  You cannot walk up stairs or exercise the way that you normally do. These symptoms may represent a serious problem that is an emergency. Do not wait to see if the symptoms will go away. Get medical help right away. Call your local emergency services (911 in the [place] Do not drive yourself to the hospital. Summary  Shortness of breath is when a person has trouble breathing enough air. It can be a sign of a medical problem.  Avoid things that irritate your lungs, such as smoking, pollution, mold, and dust.  Pay attention to changes in your symptoms and contact your health care provider if you have a  hard time completing daily activities because of shortness of breath. This information is not intended to replace advice given to you by your health care provider. Make sure you discuss any questions you have with your health care provider. Document Released: 01/05/2001 Document Revised: 09/12/2017 Document Reviewed: 09/12/2017 Elsevier Interactive Patient Education  2019 [employer].

## 2018-06-19 ENCOUNTER — Other Ambulatory Visit: Payer: Self-pay | Admitting: Internal Medicine

## 2018-06-19 ENCOUNTER — Telehealth: Payer: Self-pay | Admitting: Internal Medicine

## 2018-06-19 ENCOUNTER — Encounter: Payer: Self-pay | Admitting: Internal Medicine

## 2018-06-19 DIAGNOSIS — R7989 Other specified abnormal findings of blood chemistry: Secondary | ICD-10-CM

## 2018-06-19 DIAGNOSIS — R0602 Shortness of breath: Secondary | ICD-10-CM

## 2018-06-19 DIAGNOSIS — R9431 Abnormal electrocardiogram [ECG] [EKG]: Secondary | ICD-10-CM

## 2018-06-19 DIAGNOSIS — R778 Other specified abnormalities of plasma proteins: Secondary | ICD-10-CM

## 2018-06-19 DIAGNOSIS — I509 Heart failure, unspecified: Secondary | ICD-10-CM

## 2018-06-19 NOTE — Telephone Encounter (Signed)
Call Patient  I think the breathing is related to his heart but need an echo to determine and the earliest appt is 06/28/2018   Can we try to schedule echo earlier?  I sent lasix 20 mg to daily daily if this is his heart is it helping with shortness of breath its a fluid pill?  I am also going to place a cardiology referral asap due to CXR findings being concerned with heart failure   Also did the trelegy help this is more so for COPD not heart   [place]

## 2018-06-20 NOTE — Telephone Encounter (Signed)
Patient was informed of results.  Patient understood and no questions, comments, or concerns at this time. He has numerous appointments he would like to move up appointment for echo but it needs to be discussed first.  Also he is ok with the lasix rx.  He will go to ED Thursday if not sooner if sx do not get better.

## 2018-06-20 NOTE — Telephone Encounter (Signed)
Can we move echo appt up sooner?   [place]

## 2018-06-20 NOTE — Telephone Encounter (Signed)
I did not see patient for these matters.  Forwarding your response to [place]

## 2018-06-21 NOTE — Telephone Encounter (Signed)
The only way it can be moved up is if you call and speak to one of the providers to let them know that it is urgent that he gets this done prior to 3/4. They will not move it up for me.  Specialty scheduling # is [phone].

## 2018-06-28 ENCOUNTER — Ambulatory Visit
Admission: RE | Admit: 2018-06-28 | Discharge: 2018-06-28 | Disposition: A | Discharge status: Home / Self Care | Payer: Medicare Other | Source: Ambulatory Visit | Attending: Internal Medicine | Admitting: Internal Medicine

## 2018-06-28 ENCOUNTER — Other Ambulatory Visit: Payer: Self-pay

## 2018-06-28 DIAGNOSIS — R0602 Shortness of breath: Secondary | ICD-10-CM | POA: Diagnosis not present

## 2018-06-28 DIAGNOSIS — I10 Essential (primary) hypertension: Secondary | ICD-10-CM | POA: Insufficient documentation

## 2018-06-28 NOTE — Progress Notes (Signed)
*  PRELIMINARY RESULTS* Echocardiogram 2D Echocardiogram has been performed.  Eric Hicks 06/28/2018, 10:38 AM

## 2018-07-03 ENCOUNTER — Telehealth: Payer: Self-pay

## 2018-07-03 DIAGNOSIS — I5021 Acute systolic (congestive) heart failure: Secondary | ICD-10-CM | POA: Diagnosis not present

## 2018-07-03 DIAGNOSIS — I480 Paroxysmal atrial fibrillation: Secondary | ICD-10-CM | POA: Diagnosis not present

## 2018-07-03 DIAGNOSIS — R0602 Shortness of breath: Secondary | ICD-10-CM | POA: Diagnosis not present

## 2018-07-03 NOTE — Telephone Encounter (Signed)
Faxed pt's recent EKG to [facility] as requested.

## 2018-07-03 NOTE — Telephone Encounter (Signed)
Copied from [place] [id number]. Topic: General - Other >> Jul 03, 2018  9:23 AM Sheran Luz wrote: Reason for CRM: Eric Hicks with [facility] calling to request patients last EKG be faxed to office as they are not able to print from care everything.Patients appointment is today.  Please advise.    Fax# [phone] [name]

## 2018-07-04 DIAGNOSIS — N183 Chronic kidney disease, stage 3 (moderate): Secondary | ICD-10-CM | POA: Diagnosis not present

## 2018-07-04 DIAGNOSIS — I130 Hypertensive heart and chronic kidney disease with heart failure and stage 1 through stage 4 chronic kidney disease, or unspecified chronic kidney disease: Secondary | ICD-10-CM | POA: Diagnosis not present

## 2018-07-04 DIAGNOSIS — I5022 Chronic systolic (congestive) heart failure: Secondary | ICD-10-CM | POA: Diagnosis not present

## 2018-07-05 ENCOUNTER — Ambulatory Visit (INDEPENDENT_AMBULATORY_CARE_PROVIDER_SITE_OTHER): Payer: Medicare Other | Admitting: Internal Medicine

## 2018-07-05 ENCOUNTER — Other Ambulatory Visit: Payer: Self-pay

## 2018-07-05 ENCOUNTER — Encounter: Payer: Self-pay | Admitting: Internal Medicine

## 2018-07-05 DIAGNOSIS — I700 Atherosclerosis of aorta: Secondary | ICD-10-CM

## 2018-07-05 DIAGNOSIS — I509 Heart failure, unspecified: Secondary | ICD-10-CM | POA: Diagnosis not present

## 2018-07-05 DIAGNOSIS — I480 Paroxysmal atrial fibrillation: Secondary | ICD-10-CM

## 2018-07-05 DIAGNOSIS — I5021 Acute systolic (congestive) heart failure: Secondary | ICD-10-CM

## 2018-07-05 DIAGNOSIS — N182 Chronic kidney disease, stage 2 (mild): Secondary | ICD-10-CM

## 2018-07-05 MED ORDER — FUROSEMIDE 20 MG PO TABS: 40.0000 mg | ORAL_TABLET | Freq: Every day | ORAL | 1 refills | Status: DC | PRN

## 2018-07-05 NOTE — Progress Notes (Signed)
Subjective:  Patient ID: Eric Hicks, male    DOB: May 18, 1926  Age: 85 y.o. MRN: [id number]  CC: Diagnoses of Congestive heart failure, unspecified HF chronicity, unspecified heart failure type ([place]), Acute systolic heart failure ([place]), CKD (chronic kidney disease), stage II, Paroxysmal atrial fibrillation ([place]), and Aortic atherosclerosis ([place]) were pertinent to this visit.  HPI [place] Donnelly presents for 2 week follow up on dyspnea attributed to new onset CHF  with relative hypoxia (does not qualify for supplemental 02).  ECHO done March 4 ,  EF 30 to 35% with diffuse LV hypokinesis , and  New onset  Nonvalvular PAF  Was noted as well.  He was referred to [facility] for evaluation.  carvedilol started and 72 hour Holter monitor placed.  Has follow up with [facility] on March 20.   has CKD,  reportedly normal cardiac cath in  2010.   Resting pulse prior to use of carvedilol was 70,  Now in the 40's.taking lasix 40 mg daily   Energy level is low.  He has no appetite,  Mouth feels dry and saliva is thicker  .  [facility] increased the furosemide  dose to 40 mg in the morning two days ago.  Not weighing self  Wakes up short of breath around 2 am most nights.   Outpatient Medications Prior to Visit  Medication Sig Dispense Refill  . AMBULATORY NON FORMULARY MEDICATION Joint Advantage Gold 2 tablets daily    . calcium carbonate (OS-CAL) 600 MG TABS Take 600 mg by mouth 2 (two) times daily.    . carvedilol (COREG) 3.125 MG tablet Take 3.125 mg by mouth 2 (two) times daily with a meal.     . Cholecalciferol (VITAMIN D3) 400 units tablet Take by mouth 2 (two) times daily.    . dorzolamide-timolol (COSOPT) 22.3-6.8 MG/ML ophthalmic solution TAKE 1 DROP(S) IN RIGHT EYE 2 TIMES A DAY  5  . doxazosin (CARDURA) 2 MG tablet TAKE 1 TABLET (2 MG TOTAL) BY MOUTH AT BEDTIME. 90 tablet 1  . finasteride (PROSCAR) 5 MG tablet Take 5 mg by mouth daily.    Marland Kitchen ipratropium-albuterol (DUONEB) 0.5-2.5 (3) MG/3ML SOLN Take 3 mLs  by nebulization every 6 (six) hours as needed. 360 mL 11  . latanoprost (XALATAN) 0.005 % ophthalmic solution Place 1 drop into both eyes at bedtime.  5  . leuprolide, 6 Month, (ELIGARD) 45 MG injection Inject 45 mg into the skin every 6 (six) months.    Grant Ruts INHUB 250-50 MCG/DOSE AEPB INHALE 1 PUFF INTO THE LUNGS TWICE DAILY 60 each 11  . albuterol (PROVENTIL) (2.5 MG/3ML) 0.083% nebulizer solution Take 3 mLs (2.5 mg total) by nebulization every 6 (six) hours as needed for wheezing or shortness of breath. 150 mL 1  . furosemide (LASIX) 20 MG tablet Take 1 tablet (20 mg total) by mouth daily as needed. 30 tablet 0  . meloxicam (MOBIC) 15 MG tablet TAKE 1 TABLET (15 MG TOTAL) BY MOUTH DAILY AS NEEDED. 30 tablet 5   No facility-administered medications prior to visit.     Review of Systems;  Patient denies headache, fevers, malaise, unintentional weight loss, skin rash, eye pain, sinus congestion and sinus pain, sore throat, dysphagia,  hemoptysis , cough,  wheezing, chest pain, palpitations, abdominal pain, nausea, melena, diarrhea, constipation, flank pain, dysuria, hematuria, urinary  Frequency, nocturia, numbness, tingling, seizures,  Focal weakness, Loss of consciousness,  Tremor, insomnia, depression, anxiety, and suicidal ideation.      Objective:  BP (!) 160/76 (BP Location: Left Arm, Patient Position: Sitting, Cuff Size: Normal)   Pulse (!) 47   Temp (!) 97.3 F (36.3 C) (Oral)   Resp 17   Ht 5\' 9"  (1.753 m)   Wt 186 lb 3.2 oz (84.5 kg)   SpO2 91%   BMI 27.50 kg/m   BP Readings from Last 3 Encounters:  07/05/18 (!) 160/76  06/14/18 (!) 150/90  03/30/18 (!) 150/90    Wt Readings from Last 3 Encounters:  07/05/18 186 lb 3.2 oz (84.5 kg)  06/14/18 183 lb 3.2 oz (83.1 kg)  03/30/18 179 lb 12.8 oz (81.6 kg)    General appearance: alert, cooperative and appears stated age Ears: normal TM's and external ear canals both ears Throat: lips, mucosa, and tongue normal; teeth  and gums normal Neck: no adenopathy, no carotid bruit, supple, symmetrical, trachea midline and thyroid not enlarged, symmetric, no tenderness/mass/nodules Back: symmetric, no curvature. ROM normal. No CVA tenderness. Lungs: clear to auscultation anteriorly  Bilaterally, rales bilaterally at bases Heart: regular rate and rhythm, S1, S2 normal, no murmur, click, rub or gallop Abdomen: soft, non-tender; bowel sounds normal; no masses,  no organomegaly Pulses: 2+ and symmetric Skin: Skin color, texture, turgor normal. No rashes or lesions Lymph nodes: Cervical, supraclavicular, and axillary nodes normal.  No results found for: HGBA1C  Lab Results  Component Value Date   CREATININE 1.59 (H) 06/14/2018   CREATININE 1.72 (H) 12/15/2017   CREATININE 1.49 09/28/2017    Lab Results  Component Value Date   WBC 5.4 06/14/2018   HGB 12.8 (L) 06/14/2018   HCT 37.9 (L) 06/14/2018   PLT 170.0 06/14/2018   GLUCOSE 93 06/14/2018   CHOL 103 06/14/2018   TRIG 36.0 06/14/2018   HDL 64.30 06/14/2018   LDLCALC 32 06/14/2018   ALT 11 06/14/2018   AST 16 06/14/2018   NA 145 06/14/2018   K 4.3 06/14/2018   CL 109 06/14/2018   CREATININE 1.59 (H) 06/14/2018   BUN 28 (H) 06/14/2018   CO2 28 06/14/2018   TSH 1.69 06/14/2018   PSA 13.67 (H) 05/15/2014   MICROALBUR <0.7 06/20/2014    No results found.  Assessment & Plan:   Problem List Items Addressed This Visit    Systolic dysfunction with heart failure ([place])    Subacute in presentation,  With workup noting PAF and EF 30 t o 35 % .  Etiology unclear; he has no history of CAD and had a reportedly normal cardiac cath in 2010.  May be secondary to atrial fib. Continue  Furosemide 40 mg daily and carvedilol,  Low salt diet recommended follow up with [facility] as planned for further workup.       Relevant Medications   carvedilol (COREG) 3.125 MG tablet   furosemide (LASIX) 20 MG tablet   Paroxysmal atrial fibrillation ([facility])    Chronicity  /Onset unclear.  He is undergoing evaluation with 72 hr Holter monitor ordered by cardiology ([facility]) , He has not been anticoagulated yet. His CHADs2 score is 5  Given the presence of aortic atherosclerosis on chest film        Relevant Medications   carvedilol (COREG) 3.125 MG tablet   furosemide (LASIX) 20 MG tablet   CKD (chronic kidney disease), stage II    Repeat assessment of GFR to be done next Monday after one week of starting the higher dose of 40 mg lasix.   Lab Results  Component Value Date   CREATININE 1.59 (H)  06/14/2018         Aortic atherosclerosis ([place])    Noted on chest film Feb 2020 / cholesterol is naturally very low.   Lab Results  Component Value Date   CHOL 103 06/14/2018   HDL 64.30 06/14/2018   LDLCALC 32 06/14/2018   TRIG 36.0 06/14/2018   CHOLHDL 2 06/14/2018         Relevant Medications   carvedilol (COREG) 3.125 MG tablet   furosemide (LASIX) 20 MG tablet    Other Visit Diagnoses    Congestive heart failure, unspecified HF chronicity, unspecified heart failure type (H[facility])       Relevant Medications   carvedilol (COREG) 3.125 MG tablet   furosemide (LASIX) 20 MG tablet   Other Relevant Orders   Basic metabolic panel     A total of 40 minutes was spent with patient more than half of which was spent in counseling patient on the above mentioned issues , reviewing and explaining recent labs and imaging studies done, and coordination of care. I have discontinued Smitty Ackerley. Matteo "Clay"'s albuterol and meloxicam. I have also changed his furosemide. Additionally, I am having him maintain his calcium carbonate, AMBULATORY NON FORMULARY MEDICATION, finasteride, Vitamin D3, dorzolamide-timolol, leuprolide (6 Month), Wixela Inhub, latanoprost, doxazosin, ipratropium-albuterol, and carvedilol.  Meds ordered this encounter  Medications  . furosemide (LASIX) 20 MG tablet    Sig: Take 2 tablets (40 mg total) by mouth daily as needed.    Dispense:  180  tablet    Refill:  1    Medications Discontinued During This Encounter  Medication Reason  . furosemide (LASIX) 20 MG tablet   . meloxicam (MOBIC) 15 MG tablet   . albuterol (PROVENTIL) (2.5 MG/3ML) 0.083% nebulizer solution     Follow-up: No follow-ups on file.   Crecencio Mc, MD

## 2018-07-05 NOTE — Patient Instructions (Addendum)
You can  Continue to take  40 mg furosemide daily to get rid of the flui you are retaining    monitor  Your weight daily and take an extra 20 mg  dose if only if your overnight weight gain is 2 lbs,  or your weekly weekly weight gain is 5 lbs.    You need to follow a LOW SALT DIET (2 grams or less)    Low-Sodium Eating Plan Sodium, which is an element that makes up salt, helps you maintain a healthy balance of fluids in your body. Too much sodium can increase your blood pressure and cause fluid and waste to be held in your body. Your health care provider or dietitian may recommend following this plan if you have high blood pressure (hypertension), kidney disease, liver disease, or heart failure. Eating less sodium can help lower your blood pressure, reduce swelling, and protect your heart, liver, and kidneys. What are tips for following this plan? General guidelines  Most people on this plan should limit their sodium intake to 1,500-2,000 mg (milligrams) of sodium each day. Reading food labels   The Nutrition Facts label lists the amount of sodium in one serving of the food. If you eat more than one serving, you must multiply the listed amount of sodium by the number of servings.  Choose foods with less than 140 mg of sodium per serving.  Avoid foods with 300 mg of sodium or more per serving. Shopping  Look for lower-sodium products, often labeled as "low-sodium" or "no salt added."  Always check the sodium content even if foods are labeled as "unsalted" or "no salt added".  Buy fresh foods. ? Avoid canned foods and premade or frozen meals. ? Avoid canned, cured, or processed meats  Buy breads that have less than 80 mg of sodium per slice. Cooking  Eat more home-cooked food and less restaurant, buffet, and fast food.  Avoid adding salt when cooking. Use salt-free seasonings or herbs instead of table salt or sea salt. Check with your health care provider or pharmacist before  using salt substitutes.  Cook with plant-based oils, such as canola, sunflower, or olive oil. Meal planning  When eating at a restaurant, ask that your food be prepared with less salt or no salt, if possible.  Avoid foods that contain MSG (monosodium glutamate). MSG is sometimes added to [place] food, bouillon, and some canned foods. What foods are recommended? The items listed may not be a complete list. Talk with your dietitian about what dietary choices are best for you. Grains Low-sodium cereals, including oats, puffed wheat and rice, and shredded wheat. Low-sodium crackers. Unsalted rice. Unsalted pasta. Low-sodium bread. Whole-grain breads and whole-grain pasta. Vegetables Fresh or frozen vegetables. "No salt added" canned vegetables. "No salt added" tomato sauce and paste. Low-sodium or reduced-sodium tomato and vegetable juice. Fruits Fresh, frozen, or canned fruit. Fruit juice. Meats and other protein foods Fresh or frozen (no salt added) meat, poultry, seafood, and fish. Low-sodium canned tuna and salmon. Unsalted nuts. Dried peas, beans, and lentils without added salt. Unsalted canned beans. Eggs. Unsalted nut butters. Dairy Milk. Soy milk. Cheese that is naturally low in sodium, such as ricotta cheese, fresh mozzarella, or Swiss cheese Low-sodium or reduced-sodium cheese. Cream cheese. Yogurt. Fats and oils Unsalted butter. Unsalted margarine with no trans fat. Vegetable oils such as canola or olive oils. Seasonings and other foods Fresh and dried herbs and spices. Salt-free seasonings. Low-sodium mustard and ketchup. Sodium-free salad dressing. Sodium-free light  mayonnaise. Fresh or refrigerated horseradish. Lemon juice. Vinegar. Homemade, reduced-sodium, or low-sodium soups. Unsalted popcorn and pretzels. Low-salt or salt-free chips. What foods are not recommended? The items listed may not be a complete list. Talk with your dietitian about what dietary choices are best for you.  Grains Instant hot cereals. Bread stuffing, pancake, and biscuit mixes. Croutons. Seasoned rice or pasta mixes. Noodle soup cups. Boxed or frozen macaroni and cheese. Regular salted crackers. Self-rising flour. Vegetables Sauerkraut, pickled vegetables, and relishes. Olives. [place] fries. Onion rings. Regular canned vegetables (not low-sodium or reduced-sodium). Regular canned tomato sauce and paste (not low-sodium or reduced-sodium). Regular tomato and vegetable juice (not low-sodium or reduced-sodium). Frozen vegetables in sauces. Meats and other protein foods Meat or fish that is salted, canned, smoked, spiced, or pickled. Bacon, ham, sausage, hotdogs, corned beef, chipped beef, packaged lunch meats, salt pork, jerky, pickled herring, anchovies, regular canned tuna, sardines, salted nuts. Dairy Processed cheese and cheese spreads. Cheese curds. Blue cheese. Feta cheese. String cheese. Regular cottage cheese. Buttermilk. Canned milk. Fats and oils Salted butter. Regular margarine. Ghee. Bacon fat. Seasonings and other foods Onion salt, garlic salt, seasoned salt, table salt, and sea salt. Canned and packaged gravies. Worcestershire sauce. Tartar sauce. Barbecue sauce. Teriyaki sauce. Soy sauce, including reduced-sodium. Steak sauce. Fish sauce. Oyster sauce. Cocktail sauce. Horseradish that you find on the shelf. Regular ketchup and mustard. Meat flavorings and tenderizers. Bouillon cubes. Hot sauce and Tabasco sauce. Premade or packaged marinades. Premade or packaged taco seasonings. Relishes. Regular salad dressings. Salsa. Potato and tortilla chips. Corn chips and puffs. Salted popcorn and pretzels. Canned or dried soups. Pizza. Frozen entrees and pot pies. Summary  Eating less sodium can help lower your blood pressure, reduce swelling, and protect your heart, liver, and kidneys.  Most people on this plan should limit their sodium intake to 1,500-2,000 mg (milligrams) of sodium each day.   Canned, boxed, and frozen foods are high in sodium. Restaurant foods, fast foods, and pizza are also very high in sodium. You also get sodium by adding salt to food.  Try to cook at home, eat more fresh fruits and vegetables, and eat less fast food, canned, processed, or prepared foods. This information is not intended to replace advice given to you by your health care provider. Make sure you discuss any questions you have with your health care provider. Document Released: 10/02/2001 Document Revised: 04/05/2016 Document Reviewed: 04/05/2016 Elsevier Interactive Patient Education  2019 [employer].

## 2018-07-08 DIAGNOSIS — I420 Dilated cardiomyopathy: Secondary | ICD-10-CM | POA: Insufficient documentation

## 2018-07-08 DIAGNOSIS — I700 Atherosclerosis of aorta: Secondary | ICD-10-CM | POA: Insufficient documentation

## 2018-07-08 DIAGNOSIS — I480 Paroxysmal atrial fibrillation: Secondary | ICD-10-CM | POA: Insufficient documentation

## 2018-07-08 DIAGNOSIS — I5020 Unspecified systolic (congestive) heart failure: Secondary | ICD-10-CM

## 2018-07-08 NOTE — Assessment & Plan Note (Addendum)
Chronicity /Onset unclear.  He is undergoing evaluation with 72 hr Holter monitor ordered by cardiology ([facility]) , He has not been anticoagulated yet. His CHADs2 score is 5  Given the presence of aortic atherosclerosis on chest film

## 2018-07-08 NOTE — Assessment & Plan Note (Signed)
Noted on chest film Feb 2020 / cholesterol is naturally very low.   Lab Results  Component Value Date   CHOL 103 06/14/2018   HDL 64.30 06/14/2018   LDLCALC 32 06/14/2018   TRIG 36.0 06/14/2018   CHOLHDL 2 06/14/2018

## 2018-07-08 NOTE — Assessment & Plan Note (Signed)
Repeat assessment of GFR to be done next Monday after one week of starting the higher dose of 40 mg lasix.   Lab Results  Component Value Date   CREATININE 1.59 (H) 06/14/2018

## 2018-07-08 NOTE — Assessment & Plan Note (Addendum)
Subacute in presentation,  With workup noting PAF and EF 30 t o 35 % .  Etiology unclear; he has no history of CAD and had a reportedly normal cardiac cath in 2010.  May be secondary to atrial fib. Continue  Furosemide 40 mg daily and carvedilol,  Low salt diet recommended follow up with [facility] as planned for further workup.

## 2018-07-10 ENCOUNTER — Other Ambulatory Visit: Payer: Self-pay

## 2018-07-10 ENCOUNTER — Other Ambulatory Visit (INDEPENDENT_AMBULATORY_CARE_PROVIDER_SITE_OTHER): Payer: Medicare Other

## 2018-07-10 DIAGNOSIS — I509 Heart failure, unspecified: Secondary | ICD-10-CM

## 2018-07-10 LAB — BASIC METABOLIC PANEL
BUN: 31 mg/dL — ABNORMAL HIGH (ref 6–23)
CO2: 30 mEq/L (ref 19–32)
Calcium: 9 mg/dL (ref 8.4–10.5)
Chloride: 106 mEq/L (ref 96–112)
Creatinine, Ser: 1.75 mg/dL — ABNORMAL HIGH (ref 0.40–1.50)
GFR: 36.81 mL/min — ABNORMAL LOW (ref >60.00)
Glucose, Bld: 97 mg/dL (ref 70–99)
Potassium: 3.7 mEq/L (ref 3.5–5.1)
SODIUM: 143 meq/L (ref 135–145)

## 2018-07-14 DIAGNOSIS — I480 Paroxysmal atrial fibrillation: Secondary | ICD-10-CM | POA: Diagnosis not present

## 2018-07-14 DIAGNOSIS — I10 Essential (primary) hypertension: Secondary | ICD-10-CM | POA: Diagnosis not present

## 2018-07-14 DIAGNOSIS — N182 Chronic kidney disease, stage 2 (mild): Secondary | ICD-10-CM | POA: Diagnosis not present

## 2018-07-14 DIAGNOSIS — R0602 Shortness of breath: Secondary | ICD-10-CM | POA: Diagnosis not present

## 2018-07-14 DIAGNOSIS — I5021 Acute systolic (congestive) heart failure: Secondary | ICD-10-CM | POA: Diagnosis not present

## 2018-08-03 DIAGNOSIS — I5021 Acute systolic (congestive) heart failure: Secondary | ICD-10-CM | POA: Diagnosis not present

## 2018-08-03 DIAGNOSIS — I480 Paroxysmal atrial fibrillation: Secondary | ICD-10-CM | POA: Diagnosis not present

## 2018-08-03 DIAGNOSIS — I10 Essential (primary) hypertension: Secondary | ICD-10-CM | POA: Diagnosis not present

## 2018-08-03 DIAGNOSIS — N182 Chronic kidney disease, stage 2 (mild): Secondary | ICD-10-CM | POA: Diagnosis not present

## 2018-08-03 DIAGNOSIS — I700 Atherosclerosis of aorta: Secondary | ICD-10-CM | POA: Diagnosis not present

## 2018-10-02 ENCOUNTER — Other Ambulatory Visit: Payer: Self-pay

## 2018-10-02 ENCOUNTER — Ambulatory Visit (INDEPENDENT_AMBULATORY_CARE_PROVIDER_SITE_OTHER): Payer: Medicare Other

## 2018-10-02 ENCOUNTER — Other Ambulatory Visit (INDEPENDENT_AMBULATORY_CARE_PROVIDER_SITE_OTHER): Payer: Medicare Other

## 2018-10-02 ENCOUNTER — Ambulatory Visit (INDEPENDENT_AMBULATORY_CARE_PROVIDER_SITE_OTHER): Payer: Medicare Other | Admitting: Internal Medicine

## 2018-10-02 ENCOUNTER — Encounter: Payer: Self-pay | Admitting: Internal Medicine

## 2018-10-02 DIAGNOSIS — Z0000 Encounter for general adult medical examination without abnormal findings: Secondary | ICD-10-CM | POA: Diagnosis not present

## 2018-10-02 DIAGNOSIS — N183 Chronic kidney disease, stage 3 unspecified: Secondary | ICD-10-CM

## 2018-10-02 DIAGNOSIS — R0601 Orthopnea: Secondary | ICD-10-CM | POA: Diagnosis not present

## 2018-10-02 DIAGNOSIS — N182 Chronic kidney disease, stage 2 (mild): Secondary | ICD-10-CM | POA: Diagnosis not present

## 2018-10-02 DIAGNOSIS — I420 Dilated cardiomyopathy: Secondary | ICD-10-CM

## 2018-10-02 NOTE — Assessment & Plan Note (Signed)
workup noting PAF and EF 30 t o 35 % .  Etiology unclear; he has no history of CAD and had a reportedly normal cardiac cath in 2010.  May be secondary to atrial fib. Continue  Furosemide with increase in dose to 40 mg daily until his baseline weight has been attained.  Continue carvedilol,  Low salt diet

## 2018-10-02 NOTE — Patient Instructions (Addendum)
  Eric Hicks , Thank you for taking time to come for your Medicare Wellness Visit. I appreciate your ongoing commitment to your health goals. Please review the following plan we discussed and let me know if I can assist you in the future.   These are the goals we discussed: Goals      Patient Stated   . Follow up with Primary Care Provider (pt-stated)     As needed and maintain healthy lifestyle       This is a list of the screening recommended for you and due dates:  Health Maintenance  Topic Date Due  . Flu Shot  11/25/2018  . Tetanus Vaccine  06/13/2022  . Pneumonia vaccines  Completed

## 2018-10-02 NOTE — Patient Instructions (Signed)
Increase your furosemide dose to 40 mg daily in the morning.  Continue this dose until your weight plateaus ,  Or reaches 168 lbs.  If you plateau BEFORE you reach 168 lbs (no change in weight for 48 hours) ,  Contact me .     continue to monitor  Your weight daily and take a dose if only if your overnight weight gain is 2 lbs,  or your weekly weight gain is 5 lbs.

## 2018-10-02 NOTE — Assessment & Plan Note (Signed)
He had a decreased in GFR with higher dose of furosemide last month and has not had repeat assessment. Will reassess now,  prior to  increased dose of diuretic,  And continue higher dose  until baseline body weight is achieved , followed by close monitoring of body weight on a daily basis . May need  continue dose of 20 mg alternating with 40 mg   Lab Results  Component Value Date   CREATININE 1.75 (H) 07/10/2018

## 2018-10-02 NOTE — Progress Notes (Addendum)
Subjective:   Eric Hicks is a 85 y.o. male who presents for Medicare Annual/Subsequent preventive examination.  Review of Systems:  No ROS.  Medicare Wellness Virtual Visit.  Visual/audio telehealth visit, UTA vital signs.   See social history for additional risk factors.   Cardiac Risk Factors include: advanced age (>59mn, >>32women);hypertension;male gender     Objective:    Vitals: There were no vitals taken for this visit.  There is no height or weight on file to calculate BMI.  Advanced Directives 10/02/2018 09/28/2017 09/27/2016  Does Patient Have a Medical Advance Directive? Yes Yes Yes  Type of AParamedicof [place] will [place] will [place] will  Does patient want to make changes to medical advance directive? No - Patient declined No - Patient declined No - Patient declined  Copy of HKohlerin Chart? Yes - validated most recent copy scanned in chart (See row information) Yes Yes    Tobacco Social History   Tobacco Use  Smoking Status Former Smoker  Smokeless Tobacco Never Used     Counseling given: Not Answered   Clinical Intake:  Pre-visit preparation completed: Yes        Diabetes: No  How often do you need to have someone help you when you read instructions, pamphlets, or other written materials from your doctor or pharmacy?: 1 - Never  Interpreter Needed?: No     Past Medical History:  Diagnosis Date  . BPH (benign prostatic hyperplasia)    managed by DOlena Heckle . [place]   . Elevated PSA, between 10 and less than 20 ng/ml    DOlena Heckle watchful waiting  . Hypertension   . Osteoporosis    by DEXA   Past Surgical History:  Procedure Laterality Date  . TYMPANOSTOMY TUBE PLACEMENT  2017   Family History  Problem Relation Age of Onset  . Diabetes Mother   . Hypertension Father   . Cancer  Brother 824      multiple myeloma   Social History   Socioeconomic History  . Marital status: Widowed    Spouse name: Not on file  . Number of children: Not on file  . Years of education: Not on file  . Highest education level: Not on file  Occupational History  . Not on file  Social Needs  . Financial resource strain: Not hard at all  . Food insecurity:    Worry: Never true    Inability: Never true  . Transportation needs:    Medical: No    Non-medical: No  Tobacco Use  . Smoking status: Former SResearch scientist (life sciences) . Smokeless tobacco: Never Used  Substance and Sexual Activity  . Alcohol use: No  . Drug use: Not on file  . Sexual activity: Never  Lifestyle  . Physical activity:    Days per week: Not on file    Minutes per session: Not on file  . Stress: Not at all  Relationships  . Social connections:    Talks on phone: Not on file    Gets together: Not on file    Attends religious service: Not on file    Active member of club or organization: Not on file    Attends meetings of clubs or organizations: Not on file    Relationship status: Not on file  Other Topics Concern  . Not on file  Social History Narrative  Regular exercise-yew    Outpatient Encounter Medications as of 10/02/2018  Medication Sig  . AMBULATORY NON FORMULARY MEDICATION Joint Advantage Gold 2 tablets daily  . calcium carbonate (OS-CAL) 600 MG TABS Take 600 mg by mouth 2 (two) times daily.  . carvedilol (COREG) 3.125 MG tablet Take 3.125 mg by mouth 2 (two) times daily with a meal.   . Cholecalciferol (VITAMIN D3) 400 units tablet Take by mouth 2 (two) times daily.  . dorzolamide-timolol (COSOPT) 22.3-6.8 MG/ML ophthalmic solution TAKE 1 DROP(S) IN RIGHT EYE 2 TIMES A DAY  . doxazosin (CARDURA) 2 MG tablet TAKE 1 TABLET (2 MG TOTAL) BY MOUTH AT BEDTIME.  . furosemide (LASIX) 20 MG tablet Take 2 tablets (40 mg total) by mouth daily as needed. (Patient taking differently: Take 20 mg by mouth daily. )  .  ipratropium-albuterol (DUONEB) 0.5-2.5 (3) MG/3ML SOLN Take 3 mLs by nebulization every 6 (six) hours as needed.  . latanoprost (XALATAN) 0.005 % ophthalmic solution Place 1 drop into both eyes at bedtime.  Marland Kitchen leuprolide, 6 Month, (ELIGARD) 45 MG injection Inject 45 mg into the skin every 6 (six) months.  . meloxicam (MOBIC) 15 MG tablet TAKE 1 TABLET (15 MG TOTAL) BY MOUTH DAILY AS NEEDED.  Marland Kitchen WIXELA INHUB 250-50 MCG/DOSE AEPB INHALE 1 PUFF INTO THE LUNGS TWICE DAILY   No facility-administered encounter medications on file as of 10/02/2018.     Activities of Daily Living In your present state of health, do you have any difficulty performing the following activities: 10/02/2018  Hearing? Y  Vision? N  Difficulty concentrating or making decisions? N  Walking or climbing stairs? N  Comment Paces himself when climbing stairs  Dressing or bathing? N  Doing errands, shopping? N  Preparing Food and eating ? N  Using the Toilet? N  In the past six months, have you accidently leaked urine? N  Do you have problems with loss of bowel control? N  Managing your Medications? N  Managing your Finances? N  Housekeeping or managing your Housekeeping? N  Some recent data might be hidden    Patient Care Team: Crecencio Mc, MD as PCP - General (Internal Medicine)   Assessment:   This is a routine wellness examination for Eric Hicks.  I connected with patient 10/02/18 at 10:00 AM EDT by a video/audio enabled telemedicine application and verified that I am speaking with the correct person using two identifiers. Patient stated full name and DOB. Patient gave permission to continue with virtual visit. Patient's location was at home and Nurse's location was at [place] office.   Health Screenings  Colonoscopy - 10/2010 Bone Density - 05/2017 Glaucoma -yes Hearing -hearing aids PSA- 01/2018 Cholesterol - 05/2018 Dental- visits every 6 months Vision- visits within the last 12 months.  Social  Alcohol intake  - no     Smoking history- former  Smokers in home? none Illicit drug use? none Exercise - active around the home and with outside yard work Diet - Low sodium Sexually Active- never BMI- discussed the importance of a healthy diet, water intake and the benefits of aerobic exercise.  Educational material provided.   Safety  Patient feels safe at home- yes Patient does have smoke detectors at home- yes Patient does wear sunscreen or protective clothing when in direct sunlight -yes Patient does wear seat belt when in a moving vehicle -yes Patient drives- yes Life alert system- no. His son lives next door.   Covid-19 precautions and sickness symptoms discussed.  Activities of Daily Living Patient denies needing assistance with: driving, household chores, feeding themselves, getting from bed to chair, getting to the toilet, bathing/showering, dressing, managing money, or preparing meals.  No new identified risk were noted.    Depression Screen Patient denies losing interest in daily life, feeling hopeless, or crying easily over simple problems.   Medication-taking as directed and without issues.   Fall Screen Patient denies being afraid of falling. One fall in the last 3 months due to missing a step when working outdoors and carrying debris. No injury.   Memory Screen Patient is alert.  Patient denies difficulty focusing, concentrating or misplacing items. Correctly identified the president of the [place] , season and recall. Patient likes to play "brain games for brain power" (combination of different styles of puzzles) for brain stimulation.  Immunizations The following Immunizations were discussed: Influenza, shingles, pneumonia, and tetanus.   Other Providers Patient Care Team: Crecencio Mc, MD as PCP - General (Internal Medicine)  Exercise Activities and Dietary recommendations    Goals      Patient Stated   . Follow up with Primary Care Provider (pt-stated)     As  needed and maintain healthy lifestyle       Fall Risk Fall Risk  10/02/2018 09/28/2017 09/27/2016 07/24/2015 04/10/2014  Falls in the past year? 1 No No No No  Number falls in past yr: 0 - - - -  Injury with Fall? 0 - - - -  Risk for fall due to : (No Data) - - - -  Risk for fall due to: Comment He lost his footing and missed a step when carrying items in both hands. No injury.  - - - -   Depression Screen PHQ 2/9 Scores 10/02/2018 09/28/2017 09/27/2016 07/24/2015  PHQ - 2 Score 0 0 0 0  PHQ- 9 Score - - 0 -    Cognitive Function MMSE - Mini Mental State Exam 09/27/2016  Orientation to time 5  Orientation to Place 5  Registration 3  Attention/ Calculation 5  Recall 3  Language- name 2 objects 2  Language- repeat 1  Language- follow 3 step command 3  Language- read & follow direction 1  Write a sentence 1  Copy design 1  Total score 30     6CIT Screen 10/02/2018 09/28/2017  What Year? 0 points 0 points  What month? 0 points 0 points  What time? 0 points 0 points  Count back from 20 0 points 0 points  Months in reverse 0 points 0 points  Repeat phrase 0 points 0 points  Total Score 0 0    Immunization History  Administered Date(s) Administered  . Influenza, High Dose Seasonal PF 02/07/2016, 02/09/2017, 02/17/2018  . Influenza-Unspecified 02/26/2011, 02/13/2014, 02/12/2015  . Pneumococcal Conjugate-13 11/20/2013  . Pneumococcal Polysaccharide-23 11/11/1999, 11/11/2006  . Tdap 06/13/2012  . Zoster 02/13/2014   Screening Tests Health Maintenance  Topic Date Due  . INFLUENZA VACCINE  11/25/2018  . TETANUS/TDAP  06/13/2022  . PNA vac Low Risk Adult  Completed       Plan:    End of life planning; Advance aging; Advanced directives discussed.  Copy of current HCPOA/Living Will on file.  Pick up AVS from pcp upon arrival for labs and xray.     I have personally reviewed and noted the following in the patient's chart:   . Medical and social history . Use of alcohol, tobacco or  illicit drugs  . Current medications and  supplements . Functional ability and status . Nutritional status . Physical activity . Advanced directives . List of other physicians . Hospitalizations, surgeries, and ER visits in previous 12 months . Vitals . Screenings to include cognitive, depression, and falls . Referrals and appointments  In addition, I have reviewed and discussed with patient certain preventive protocols, quality metrics, and best practice recommendations. A written personalized care plan for preventive services as well as general preventive health recommendations were provided to patient.     OBrien-Blaney, Ember Henrikson L, LPN  05/30/4626   I have reviewed the above information and agree with above.   Deborra Medina, MD

## 2018-10-02 NOTE — Progress Notes (Signed)
Virtual Visit converted to telephone  Visit   This visit type was conducted due to national recommendations for restrictions regarding the COVID-19 pandemic (e.g. social distancing).  This format is felt to be most appropriate for this patient at this time.  All issues noted in this document were discussed and addressed.  No physical exam was performed (except for noted visual exam findings with Video Visits).   I attempted to connect with@ on 10/02/18 at  9:30 AM EDT by a video enabled telemedicine application ; however  Interactive audio and video telecommunications  Ultimately failed, due to patient having technical difficulties.   We continued and completed visit with audio only. I and verified that I am speaking with the correct person using two identifiers. Location patient: home Location provider: work or home office Persons participating in the virtual visit: patient, provider  I discussed the limitations, risks, security and privacy concerns of performing an evaluation and management service by telephone and the availability of in person appointments. I also discussed with the patient that there may be a patient responsible charge related to this service. The patient expressed understanding and agreed to proceed.  Reason for visit: 6 month follow up on CKD,  COPD,  Recently diagnosed CHF    HPI  85 yr old MALE with COPD CKD> recently diagnosed with dilated nonischemic cardiomyopathy.  Saw  [facility] in  April  . EF 35% , PAF was noted on Holter monitor  20 beat runs.  nonischemic dilated CM .  Anticoagulation deferred . Lasix reduced to 20 mg daily  carvedilol continued 6.25 mg bid  3 month follow up planned    HE HAS BEEN MORE SOB WITH ACTIVITY AND HAS DEVELOPED ORTHOPNEA AGAIN.  WEIGHT GAIN OF 10 LBS SINCE REDUCING DOSE TO 20 MG DAILY .  Denies chest pain.  Following a low sodium diet.  Prostate CA:  No recurrence  Broadus John)   CKD:  CR INCREASED fro m1.5 to 1.7 on higher  dose of lasix in march   ROS: See pertinent positives and negatives per HPI.  Past Medical History:  Diagnosis Date  . BPH (benign prostatic hyperplasia)    managed by Olena Heckle  . COPD (chronic obstructive pulmonary disease) ([place])   . Elevated PSA, between 10 and less than 20 ng/ml    Olena Heckle, watchful waiting  . Hypertension   . Osteoporosis    by DEXA    Past Surgical History:  Procedure Laterality Date  . TYMPANOSTOMY TUBE PLACEMENT  2017    Family History  Problem Relation Age of Onset  . Diabetes Mother   . Hypertension Father   . Cancer Brother 31       multiple myeloma    SOCIAL HX:  reports that he has quit smoking. He has never used smokeless tobacco. He reports that he does not drink alcohol. He is a widower who is IADL.   Current Outpatient Medications:  .  AMBULATORY NON FORMULARY MEDICATION, Joint Advantage Gold 2 tablets daily, Disp: , Rfl:  .  calcium carbonate (OS-CAL) 600 MG TABS, Take 600 mg by mouth 2 (two) times daily., Disp: , Rfl:  .  carvedilol (COREG) 3.125 MG tablet, Take 3.125 mg by mouth 2 (two) times daily with a meal. , Disp: , Rfl:  .  Cholecalciferol (VITAMIN D3) 400 units tablet, Take by mouth 2 (two) times daily., Disp: , Rfl:  .  dorzolamide-timolol (COSOPT) 22.3-6.8 MG/ML ophthalmic solution, TAKE 1 DROP(S) IN RIGHT EYE 2  TIMES A DAY, Disp: , Rfl: 5 .  doxazosin (CARDURA) 2 MG tablet, TAKE 1 TABLET (2 MG TOTAL) BY MOUTH AT BEDTIME., Disp: 90 tablet, Rfl: 1 .  furosemide (LASIX) 20 MG tablet, Take 2 tablets (40 mg total) by mouth daily as needed. (Patient taking differently: Take 20 mg by mouth daily. ), Disp: 180 tablet, Rfl: 1 .  ipratropium-albuterol (DUONEB) 0.5-2.5 (3) MG/3ML SOLN, Take 3 mLs by nebulization every 6 (six) hours as needed., Disp: 360 mL, Rfl: 11 .  latanoprost (XALATAN) 0.005 % ophthalmic solution, Place 1 drop into both eyes at bedtime., Disp: , Rfl: 5 .  leuprolide, 6 Month, (ELIGARD) 45 MG injection, Inject 45 mg into  the skin every 6 (six) months., Disp: , Rfl:  .  meloxicam (MOBIC) 15 MG tablet, TAKE 1 TABLET (15 MG TOTAL) BY MOUTH DAILY AS NEEDED., Disp: , Rfl:  .  WIXELA INHUB 250-50 MCG/DOSE AEPB, INHALE 1 PUFF INTO THE LUNGS TWICE DAILY, Disp: 60 each, Rfl: 11  EXAM:  VITALS per patient if applicable:  GENERAL: alert, oriented, appears well and in no acute distress  HEENT: atraumatic, conjunttiva clear, no obvious abnormalities on inspection of external nose and ears  NECK: normal movements of the head and neck  LUNGS: on inspection no signs of respiratory distress, breathing rate appears normal, no obvious gross SOB, gasping or wheezing  CV: no obvious cyanosis  MS: moves all visible extremities without noticeable abnormality  PSYCH/NEURO: pleasant and cooperative, no obvious depression or anxiety, speech and thought processing grossly intact  ASSESSMENT AND PLAN:  Discussed the following assessment and plan:  Orthopnea - Plan: DG Chest 2 View, B Nat Peptide, CANCELED: B Nat Peptide  CKD (chronic kidney disease) stage 3, GFR 30-59 ml/min (HCC) - Plan: Basic metabolic panel, CANCELED: Basic metabolic panel  CKD (chronic kidney disease), stage II  Nonischemic dilated cardiomyopathy (HCC)  CKD (chronic kidney disease), stage II He had a decreased in GFR with higher dose of furosemide last month and has not had repeat assessment. Will reassess now,  prior to  increased dose of diuretic,  And continue higher dose  until baseline body weight is achieved , followed by close monitoring of body weight on a daily basis . May need  continue dose of 20 mg alternating with 40 mg   Lab Results  Component Value Date   CREATININE 1.75 (H) 07/10/2018     Nonischemic dilated cardiomyopathy ([place]) workup noting PAF and EF 30 t o 35 % .  Etiology unclear; he has no history of CAD and had a reportedly normal cardiac cath in 2010.  May be secondary to atrial fib. Continue  Furosemide with increase in  dose to 40 mg daily until his baseline weight has been attained.  Continue carvedilol,  Low salt diet    I discussed the assessment and treatment plan with the patient. The patient was provided an opportunity to ask questions and all were answered. The patient agreed with the plan and demonstrated an understanding of the instructions.   The patient was advised to call back or seek an in-person evaluation if the symptoms worsen or if the condition fails to improve as anticipated.  I provided  25 minutes of non-face-to-face time during this encounter.   Crecencio Mc, MD

## 2018-10-03 LAB — BRAIN NATRIURETIC PEPTIDE: Brain Natriuretic Peptide: >4200 pg/mL — ABNORMAL HIGH (ref <100)

## 2018-10-03 LAB — BASIC METABOLIC PANEL
BUN/Creatinine Ratio: 14 (calc) (ref 6–22)
BUN: 21 mg/dL (ref 7–25)
CO2: 28 mmol/L (ref 20–32)
Calcium: 8.9 mg/dL (ref 8.6–10.3)
Chloride: 107 mmol/L (ref 98–110)
Creat: 1.5 mg/dL — ABNORMAL HIGH (ref 0.70–1.11)
Glucose, Bld: 113 mg/dL — ABNORMAL HIGH (ref 65–99)
Potassium: 3.7 mmol/L (ref 3.5–5.3)
Sodium: 143 mmol/L (ref 135–146)

## 2018-10-16 ENCOUNTER — Other Ambulatory Visit: Payer: Self-pay | Admitting: Internal Medicine

## 2018-10-20 DIAGNOSIS — H401112 Primary open-angle glaucoma, right eye, moderate stage: Secondary | ICD-10-CM | POA: Diagnosis not present

## 2018-10-23 ENCOUNTER — Other Ambulatory Visit: Payer: Self-pay | Admitting: Internal Medicine

## 2018-11-14 DIAGNOSIS — N182 Chronic kidney disease, stage 2 (mild): Secondary | ICD-10-CM | POA: Diagnosis not present

## 2018-11-14 DIAGNOSIS — I480 Paroxysmal atrial fibrillation: Secondary | ICD-10-CM | POA: Diagnosis not present

## 2018-11-14 DIAGNOSIS — I482 Chronic atrial fibrillation, unspecified: Secondary | ICD-10-CM | POA: Diagnosis not present

## 2018-11-14 DIAGNOSIS — I10 Essential (primary) hypertension: Secondary | ICD-10-CM | POA: Diagnosis not present

## 2018-11-14 DIAGNOSIS — I420 Dilated cardiomyopathy: Secondary | ICD-10-CM | POA: Diagnosis not present

## 2018-12-12 DIAGNOSIS — H6982 Other specified disorders of Eustachian tube, left ear: Secondary | ICD-10-CM | POA: Diagnosis not present

## 2018-12-12 DIAGNOSIS — H6122 Impacted cerumen, left ear: Secondary | ICD-10-CM | POA: Diagnosis not present

## 2018-12-31 ENCOUNTER — Other Ambulatory Visit: Payer: Self-pay | Admitting: Internal Medicine

## 2018-12-31 DIAGNOSIS — I509 Heart failure, unspecified: Secondary | ICD-10-CM

## 2019-01-16 ENCOUNTER — Emergency Department: Payer: Medicare Other

## 2019-01-16 ENCOUNTER — Other Ambulatory Visit: Payer: Self-pay

## 2019-01-16 ENCOUNTER — Encounter: Payer: Self-pay | Admitting: Emergency Medicine

## 2019-01-16 ENCOUNTER — Emergency Department
Admission: EM | Admit: 2019-01-16 | Discharge: 2019-01-16 | Disposition: A | Discharge status: Home / Self Care | Payer: Medicare Other | Attending: Student in an Organized Health Care Education/Training Program | Admitting: Student in an Organized Health Care Education/Training Program

## 2019-01-16 DIAGNOSIS — W19XXXA Unspecified fall, initial encounter: Secondary | ICD-10-CM

## 2019-01-16 DIAGNOSIS — Y998 Other external cause status: Secondary | ICD-10-CM | POA: Diagnosis not present

## 2019-01-16 DIAGNOSIS — R0789 Other chest pain: Secondary | ICD-10-CM | POA: Insufficient documentation

## 2019-01-16 DIAGNOSIS — R51 Headache: Secondary | ICD-10-CM | POA: Diagnosis present

## 2019-01-16 DIAGNOSIS — I129 Hypertensive chronic kidney disease with stage 1 through stage 4 chronic kidney disease, or unspecified chronic kidney disease: Secondary | ICD-10-CM | POA: Insufficient documentation

## 2019-01-16 DIAGNOSIS — Z79899 Other long term (current) drug therapy: Secondary | ICD-10-CM | POA: Insufficient documentation

## 2019-01-16 DIAGNOSIS — N182 Chronic kidney disease, stage 2 (mild): Secondary | ICD-10-CM | POA: Diagnosis not present

## 2019-01-16 DIAGNOSIS — Y9301 Activity, walking, marching and hiking: Secondary | ICD-10-CM | POA: Insufficient documentation

## 2019-01-16 DIAGNOSIS — Y92017 Garden or yard in single-family (private) house as the place of occurrence of the external cause: Secondary | ICD-10-CM | POA: Diagnosis not present

## 2019-01-16 DIAGNOSIS — R079 Chest pain, unspecified: Secondary | ICD-10-CM | POA: Diagnosis not present

## 2019-01-16 DIAGNOSIS — Z8546 Personal history of malignant neoplasm of prostate: Secondary | ICD-10-CM | POA: Diagnosis not present

## 2019-01-16 DIAGNOSIS — S0990XA Unspecified injury of head, initial encounter: Secondary | ICD-10-CM | POA: Diagnosis not present

## 2019-01-16 DIAGNOSIS — J449 Chronic obstructive pulmonary disease, unspecified: Secondary | ICD-10-CM | POA: Insufficient documentation

## 2019-01-16 DIAGNOSIS — Z87891 Personal history of nicotine dependence: Secondary | ICD-10-CM | POA: Insufficient documentation

## 2019-01-16 DIAGNOSIS — S299XXA Unspecified injury of thorax, initial encounter: Secondary | ICD-10-CM | POA: Diagnosis not present

## 2019-01-16 DIAGNOSIS — W010XXA Fall on same level from slipping, tripping and stumbling without subsequent striking against object, initial encounter: Secondary | ICD-10-CM | POA: Diagnosis not present

## 2019-01-16 MED ORDER — TRAMADOL HCL 50 MG PO TABS: 25.0000 mg | ORAL_TABLET | Freq: Four times a day (QID) | ORAL | 0 refills | Status: DC | PRN

## 2019-01-16 MED ORDER — LIDOCAINE 5 % EX PTCH
1.0000 | MEDICATED_PATCH | CUTANEOUS | Status: DC
  Administered 2019-01-16: 21:00:00 1 via TRANSDERMAL
  Filled 2019-01-16: qty 1

## 2019-01-16 MED ORDER — LIDOCAINE 5 % EX PTCH: 1.0000 | MEDICATED_PATCH | Freq: Two times a day (BID) | CUTANEOUS | 0 refills | Status: DC

## 2019-01-16 MED ORDER — TRAMADOL HCL 50 MG PO TABS
25.0000 mg | ORAL_TABLET | Freq: Once | ORAL | Status: AC
  Administered 2019-01-16: 25 mg via ORAL
  Filled 2019-01-16: qty 1

## 2019-01-16 NOTE — Discharge Instructions (Addendum)
°  IMPRESSION:  1. No displaced rib fractures or other acute osseous abnormality.  2. Suspicious nodule in the right upper lobe has a maximum dimension  of 1 cm. Consider one of the following in 3 months for both low-risk  and high-risk individuals: repeat chest CT, pulmonary medicine or  thoracic surgery consultation, or follow-up PET-CT.  3. Ascending thoracic aortic aneurysm. Ascending thoracic aortic  aneurysm. Recommend semi-annual imaging followup by CTA or MRA and  referral to cardiothoracic surgery if not already obtained. This  recommendation follows 2010  ACCF/AHA/AATS/ACR/ASA/SCA/SCAI/SIR/STS/SVM Guidelines for the  Diagnosis and Management of Patients With Thoracic Aortic Disease.  Circulation. 2010; 121: I712-W580. Aortic aneurysm NOS (ICD10-I71.9)  Aortic aneurysm NOS (ICD10-I71.9).  4.  Aortic Atherosclerosis (ICD10-I70.0).

## 2019-01-16 NOTE — ED Notes (Signed)
This RN asked pt if he wanted a warm blanket, pt refused. NAD.

## 2019-01-16 NOTE — ED Triage Notes (Signed)
Patient reports he tripped in his yard and fell, landing on his chest and hitting the right side of his head. Patient denies LOC, headache or vision changes. Denies use of blood thinners. Patient complaining of chest pain, worse with movement.

## 2019-01-16 NOTE — ED Provider Notes (Signed)
[facility] Emergency Department Provider Note    First MD Initiated Contact with Patient 01/16/19 1951     (approximate)  I have reviewed the triage vital signs and the nursing notes.   HISTORY  Chief Complaint Fall    HPI Eric Hicks is a 85 y.o. male below listed past medical history presents the ER for evaluation of right-sided headache as well as anterior chest wall pain that occurred after the patient was walking in his yard and tripped over a step landing on his anterior chest on the ground and hitting the right side of his head.  There is no LOC.  Denies any other pain.  States the pain is mid sternal and reproducible with touching.  States that it has progressively worsened over the past several hours.  Denies any diaphoresis.  No pain radiating through to his back or neck.  It is worsened with moving his arms.  Denies any pressure or shortness of breath.    Past Medical History:  Diagnosis Date  . BPH (benign prostatic hyperplasia)    managed by Olena Heckle  . COPD (chronic obstructive pulmonary disease) ([place])   . Elevated PSA, between 10 and less than 20 ng/ml    Olena Heckle, watchful waiting  . Hypertension   . Osteoporosis    by DEXA   Family History  Problem Relation Age of Onset  . Diabetes Mother   . Hypertension Father   . Cancer Brother 24       multiple myeloma   Past Surgical History:  Procedure Laterality Date  . TYMPANOSTOMY TUBE PLACEMENT  2017   Patient Active Problem List   Diagnosis Date Noted  . Nonischemic dilated cardiomyopathy ([place]) 07/08/2018  . Paroxysmal atrial fibrillation ([place]) 07/08/2018  . Aortic atherosclerosis ([place]) 07/08/2018  . Low back pain 12/03/2017  . OA (osteoarthritis) of neck 09/29/2017  . Prostate cancer ([place]) 03/30/2016  . S/p bilateral myringotomy with tube placement 03/30/2016  . Hearing aid worn 03/30/2016  . Chronic hip pain 04/13/2014  . CKD (chronic kidney disease), stage II 11/12/2012  .  Hypertension 11/12/2012  . Medicare annual wellness visit, subsequent 11/10/2012  . Osteoporosis 01/03/2012  . History of adenomatous polyp of colon 11/14/2011  . COPD (chronic obstructive pulmonary disease) with emphysema ([place]) 11/14/2011  . BPH (benign prostatic hyperplasia)       Prior to Admission medications   Medication Sig Start Date End Date Taking? Authorizing Provider  AMBULATORY NON FORMULARY MEDICATION Joint Advantage Gold 2 tablets daily    [provider]  calcium carbonate (OS-CAL) 600 MG TABS Take 600 mg by mouth 2 (two) times daily.    [provider]  carvedilol (COREG) 3.125 MG tablet Take 3.125 mg by mouth 2 (two) times daily with a meal.  07/03/18 07/03/19  [provider]  Cholecalciferol (VITAMIN D3) 400 units tablet Take by mouth 2 (two) times daily.    [provider]  dorzolamide-timolol (COSOPT) 22.3-6.8 MG/ML ophthalmic solution TAKE 1 DROP(S) IN RIGHT EYE 2 TIMES A DAY 07/29/17   [provider]  doxazosin (CARDURA) 2 MG tablet TAKE 1 TABLET (2 MG TOTAL) BY MOUTH AT BEDTIME. 10/16/18   Crecencio Mc, MD  furosemide (LASIX) 20 MG tablet TAKE 2 TABLETS (40 MG TOTAL) BY MOUTH DAILY AS NEEDED. 01/02/19   Crecencio Mc, MD  ipratropium-albuterol (DUONEB) 0.5-2.5 (3) MG/3ML SOLN Take 3 mLs by nebulization every 6 (six) hours as needed. 06/14/18   McLean-Scocuzza, Nino Glow,  MD  latanoprost (XALATAN) 0.005 % ophthalmic solution Place 1 drop into both eyes at bedtime. 03/21/18   [provider]  leuprolide, 6 Month, (ELIGARD) 45 MG injection Inject 45 mg into the skin every 6 (six) months.    [provider]  lidocaine (LIDODERM) 5 % Place 1 patch onto the skin every 12 (twelve) hours. Remove & Discard patch within 12 hours or as directed by MD 01/16/19 01/16/20  Merlyn Lot, MD  meloxicam (MOBIC) 15 MG tablet TAKE 1 TABLET (15 MG TOTAL) BY MOUTH DAILY AS NEEDED. 08/17/18   [provider]  traMADol  (ULTRAM) 50 MG tablet Take 0.5 tablets (25 mg total) by mouth every 6 (six) hours as needed. 01/16/19 01/16/20  Merlyn Lot, MD  Grant Ruts INHUB 250-50 MCG/DOSE AEPB INHALE 1 PUFF INTO THE LUNGS TWICE DAILY 10/23/18   Crecencio Mc, MD    Allergies Tylenol [acetaminophen]    Social History Social History   Tobacco Use  . Smoking status: Former Research scientist (life sciences)  . Smokeless tobacco: Never Used  Substance Use Topics  . Alcohol use: No  . Drug use: Not on file    Review of Systems Patient denies headaches, rhinorrhea, blurry vision, numbness, shortness of breath, chest pain, edema, cough, abdominal pain, nausea, vomiting, diarrhea, dysuria, fevers, rashes or hallucinations unless otherwise stated above in HPI. ____________________________________________   PHYSICAL EXAM:  VITAL SIGNS: Vitals:   01/16/19 1829  BP: (!) 150/83  Pulse: 69  Resp: 16  Temp: 98.8 F (37.1 C)  SpO2: 97%    Constitutional: Alert and oriented.  Eyes: Conjunctivae are normal.  Head: Atraumatic. Nose: No congestion/rhinnorhea. Mouth/Throat: Mucous membranes are moist.   Neck: No stridor. Painless ROM.  Cardiovascular: Normal rate, regular rhythm. Grossly normal heart sounds.  Good peripheral circulation. Respiratory: Normal respiratory effort.  No retractions. Lungs CTAB. Gastrointestinal: Soft and nontender. No distention. No abdominal bruits. No CVA tenderness. Genitourinary:  Musculoskeletal: Tenderness to palpation of the mid sternum.  No crepitus.  No instability.  No lower extremity tenderness nor edema.  No joint effusions. Neurologic:  Normal speech and language. No gross focal neurologic deficits are appreciated. No facial droop Skin:  Skin is warm, dry and intact. No rash noted. Psychiatric: Mood and affect are normal. Speech and behavior are normal.  ____________________________________________   LABS (all labs ordered are listed, but only abnormal results are displayed)  No results  found for this or any previous visit (from the past 24 hour(s)). ____________________________________________  EKG My review and personal interpretation at Time: 18:16   Indication: chest pain  Rate: 70  Rhythm: sinus Axis: normal Other: normal intervals, nonspecific st abn, no stemi ____________________________________________  RADIOLOGY  I personally reviewed all radiographic images ordered to evaluate for the above acute complaints and reviewed radiology reports and findings.  These findings were personally discussed with the patient.  Please see medical record for radiology report.  ____________________________________________   PROCEDURES  Procedure(s) performed:  Procedures    Critical Care performed: no ____________________________________________   INITIAL IMPRESSION / ASSESSMENT AND PLAN / ED COURSE  Pertinent labs & imaging results that were available during my care of the patient were reviewed by me and considered in my medical decision making (see chart for details).   DDX: Contusion fracture, musculoskeletal strain, pneumothorax, SDH, SAH, concussion  EITAN DOUBLEDAY is a 85 y.o. who presents to the ED with symptoms as described above.  Radiographs and CT imaging ordered to evaluate for traumatic injury based on age  and risk factors.   Abdominal exam soft and benign in four quadrants.  AFVSS.   EKG shows no evidence of acute ischemia or dysrhythmia.  No report of presyncopal symptoms.  C/w mechanical fall..  Has reproducible anterior chest wall pain without evidence of fracture by imaging.  No evidence of acute intracranial abnormality.  Discussed results of incidental finding of aneurysm.  Is not consistent with dissection.  Appropriate for outpatient referral.  Symptoms improved after pain medication.  Able to ambulate steady gait.  Appropriate for outpatient follow-up.     The patient was evaluated in Emergency Department today for the symptoms described in the  history of present illness. He/she was evaluated in the context of the global COVID-19 pandemic, which necessitated consideration that the patient might be at risk for infection with the SARS-CoV-2 virus that causes COVID-19. Institutional protocols and algorithms that pertain to the evaluation of patients at risk for COVID-19 are in a state of rapid change based on information released by regulatory bodies including the CDC and federal and state organizations. These policies and algorithms were followed during the patient's care in the ED.  As part of my medical decision making, I reviewed the following data within the [place] notes reviewed and incorporated, Labs reviewed, notes from prior ED visits and [place] Controlled Substance Database   ____________________________________________   FINAL CLINICAL IMPRESSION(S) / ED DIAGNOSES  Final diagnoses:  Chest wall pain  Fall, initial encounter      NEW MEDICATIONS STARTED DURING THIS VISIT:  New Prescriptions   LIDOCAINE (LIDODERM) 5 %    Place 1 patch onto the skin every 12 (twelve) hours. Remove & Discard patch within 12 hours or as directed by MD   TRAMADOL (ULTRAM) 50 MG TABLET    Take 0.5 tablets (25 mg total) by mouth every 6 (six) hours as needed.     Note:  This document was prepared using Dragon voice recognition software and may include unintentional dictation errors.    Merlyn Lot, MD 01/16/19 2123

## 2019-02-14 DIAGNOSIS — I420 Dilated cardiomyopathy: Secondary | ICD-10-CM | POA: Diagnosis not present

## 2019-02-14 DIAGNOSIS — R0602 Shortness of breath: Secondary | ICD-10-CM | POA: Diagnosis not present

## 2019-02-14 DIAGNOSIS — I480 Paroxysmal atrial fibrillation: Secondary | ICD-10-CM | POA: Diagnosis not present

## 2019-02-14 DIAGNOSIS — I700 Atherosclerosis of aorta: Secondary | ICD-10-CM | POA: Diagnosis not present

## 2019-04-11 ENCOUNTER — Other Ambulatory Visit: Payer: Self-pay | Admitting: Internal Medicine

## 2019-04-18 DIAGNOSIS — H401112 Primary open-angle glaucoma, right eye, moderate stage: Secondary | ICD-10-CM | POA: Diagnosis not present

## 2019-04-26 DIAGNOSIS — H401112 Primary open-angle glaucoma, right eye, moderate stage: Secondary | ICD-10-CM | POA: Diagnosis not present

## 2019-06-01 DIAGNOSIS — H903 Sensorineural hearing loss, bilateral: Secondary | ICD-10-CM | POA: Diagnosis not present

## 2019-06-01 DIAGNOSIS — H6123 Impacted cerumen, bilateral: Secondary | ICD-10-CM | POA: Diagnosis not present

## 2019-06-01 DIAGNOSIS — H6983 Other specified disorders of Eustachian tube, bilateral: Secondary | ICD-10-CM | POA: Diagnosis not present

## 2019-06-13 DIAGNOSIS — I480 Paroxysmal atrial fibrillation: Secondary | ICD-10-CM | POA: Diagnosis not present

## 2019-06-13 DIAGNOSIS — I420 Dilated cardiomyopathy: Secondary | ICD-10-CM | POA: Diagnosis not present

## 2019-06-13 DIAGNOSIS — I10 Essential (primary) hypertension: Secondary | ICD-10-CM | POA: Diagnosis not present

## 2019-06-13 DIAGNOSIS — I482 Chronic atrial fibrillation, unspecified: Secondary | ICD-10-CM | POA: Diagnosis not present

## 2019-06-13 DIAGNOSIS — R0602 Shortness of breath: Secondary | ICD-10-CM | POA: Diagnosis not present

## 2019-07-01 ENCOUNTER — Other Ambulatory Visit: Payer: Self-pay | Admitting: Internal Medicine

## 2019-07-01 DIAGNOSIS — I509 Heart failure, unspecified: Secondary | ICD-10-CM

## 2019-07-11 DIAGNOSIS — I10 Essential (primary) hypertension: Secondary | ICD-10-CM | POA: Diagnosis not present

## 2019-07-16 DIAGNOSIS — N1832 Chronic kidney disease, stage 3b: Secondary | ICD-10-CM | POA: Diagnosis not present

## 2019-07-16 DIAGNOSIS — I10 Essential (primary) hypertension: Secondary | ICD-10-CM | POA: Diagnosis not present

## 2019-07-16 DIAGNOSIS — I5022 Chronic systolic (congestive) heart failure: Secondary | ICD-10-CM | POA: Diagnosis not present

## 2019-08-30 ENCOUNTER — Other Ambulatory Visit: Payer: Self-pay

## 2019-08-30 ENCOUNTER — Encounter: Payer: Self-pay | Admitting: Internal Medicine

## 2019-08-30 ENCOUNTER — Ambulatory Visit (INDEPENDENT_AMBULATORY_CARE_PROVIDER_SITE_OTHER): Payer: Medicare Other | Admitting: Internal Medicine

## 2019-08-30 VITALS — BP 134/84 | HR 72 | Temp 97.5°F | Resp 15 | Ht 69.0 in | Wt 174.2 lb

## 2019-08-30 DIAGNOSIS — I700 Atherosclerosis of aorta: Secondary | ICD-10-CM | POA: Diagnosis not present

## 2019-08-30 DIAGNOSIS — I712 Thoracic aortic aneurysm, without rupture: Secondary | ICD-10-CM

## 2019-08-30 DIAGNOSIS — I7121 Aneurysm of the ascending aorta, without rupture: Secondary | ICD-10-CM

## 2019-08-30 DIAGNOSIS — N182 Chronic kidney disease, stage 2 (mild): Secondary | ICD-10-CM

## 2019-08-30 DIAGNOSIS — I10 Essential (primary) hypertension: Secondary | ICD-10-CM | POA: Diagnosis not present

## 2019-08-30 DIAGNOSIS — R911 Solitary pulmonary nodule: Secondary | ICD-10-CM

## 2019-08-30 LAB — CBC WITH DIFFERENTIAL/PLATELET
Basophils Absolute: 0 10*3/uL (ref 0.0–0.1)
Basophils Relative: 0.6 % (ref 0.0–3.0)
Eosinophils Absolute: 0.4 10*3/uL (ref 0.0–0.7)
Eosinophils Relative: 6.9 % — ABNORMAL HIGH (ref 0.0–5.0)
HCT: 39.1 % (ref 39.0–52.0)
Hemoglobin: 13.2 g/dL (ref 13.0–17.0)
Lymphocytes Relative: 22.7 % (ref 12.0–46.0)
Lymphs Abs: 1.3 10*3/uL (ref 0.7–4.0)
MCHC: 33.9 g/dL (ref 30.0–36.0)
MCV: 95.2 fl (ref 78.0–100.0)
Monocytes Absolute: 0.6 10*3/uL (ref 0.1–1.0)
Monocytes Relative: 9.5 % (ref 3.0–12.0)
Neutro Abs: 3.5 10*3/uL (ref 1.4–7.7)
Neutrophils Relative %: 60.3 % (ref 43.0–77.0)
Platelets: 187 10*3/uL (ref 150.0–400.0)
RBC: 4.1 Mil/uL — ABNORMAL LOW (ref 4.22–5.81)
RDW: 14.4 % (ref 11.5–15.5)
WBC: 5.9 10*3/uL (ref 4.0–10.5)

## 2019-08-30 LAB — COMPREHENSIVE METABOLIC PANEL
ALT: 8 U/L (ref 0–53)
AST: 16 U/L (ref 0–37)
Albumin: 3.8 g/dL (ref 3.5–5.2)
Alkaline Phosphatase: 57 U/L (ref 39–117)
BUN: 29 mg/dL — ABNORMAL HIGH (ref 6–23)
CO2: 29 mEq/L (ref 19–32)
Calcium: 9.2 mg/dL (ref 8.4–10.5)
Chloride: 107 mEq/L (ref 96–112)
Creatinine, Ser: 1.69 mg/dL — ABNORMAL HIGH (ref 0.40–1.50)
GFR: 38.23 mL/min — ABNORMAL LOW (ref >60.00)
Glucose, Bld: 89 mg/dL (ref 70–99)
Potassium: 3.8 mEq/L (ref 3.5–5.1)
Sodium: 143 mEq/L (ref 135–145)
Total Bilirubin: 0.7 mg/dL (ref 0.2–1.2)
Total Protein: 6 g/dL (ref 6.0–8.3)

## 2019-08-30 LAB — LIPID PANEL
Cholesterol: 123 mg/dL (ref 0–200)
HDL: 64.7 mg/dL (ref >39.00)
LDL Cholesterol: 47 mg/dL (ref 0–99)
NonHDL: 58.05
Total CHOL/HDL Ratio: 2
Triglycerides: 56 mg/dL (ref 0.0–149.0)
VLDL: 11.2 mg/dL (ref 0.0–40.0)

## 2019-08-30 NOTE — Patient Instructions (Signed)
Reduce the Cardura to 1 mg at bedtime. Cut in half if possible  We need to image your chest somehow before your next meeting with Paraschos  Because you have a suspicious lung nodule  In the right upper lobe

## 2019-08-30 NOTE — Progress Notes (Signed)
Patient ID: Eric Hicks, male    DOB: Sep 23, 1926  Age: 86 y.o. MRN: [id number]  The patient is here for FOLLOW UP AND and management of other chronic and acute problems.  This visit occurred during the SARS-CoV-2 public health emergency.  Safety protocols were in place, including screening questions prior to the visit, additional usage of staff PPE, and extensive cleaning of exam room while observing appropriate contact time as indicated for disinfecting solutions.    Patient has received both doses of the available COVID 19 vaccine without complications.  Patient continues to mask when outside of the home except when walking in yard or at safe distances from others .  Patient denies any change in mood or development of unhealthy behaviors resuting from the pandemic's restriction of activities and socialization.   The risk factors are reflected in the social history.  The roster of all physicians providing medical care to patient - is listed in the Snapshot section of the chart.  Activities of daily living:  The patient is 100% independent in all ADLs: dressing, toileting, feeding as well as independent mobility  Home safety : The patient has smoke detectors in the home. They wear seatbelts.  There are no firearms at home. There is no violence in the home.   There is no risks for hepatitis, STDs or HIV. There is no   history of blood transfusion. They have no travel history to infectious disease endemic areas of the world.  The patient has seen their dentist in the last six month. They have seen their eye doctor in the last year. They admit to slight hearing difficulty with regard to whispered voices and some television programs.  They have deferred audiologic testing in the last year.  They do not  have excessive sun exposure. Discussed the need for sun protection: hats, long sleeves and use of sunscreen if there is significant sun exposure.   Diet: the importance of a healthy diet is discussed.  They do have a healthy diet.  The benefits of regular aerobic exercise were discussed. She walks 4 times per week ,  20 minutes.   Depression screen: there are no signs or vegative symptoms of depression- irritability, change in appetite, anhedonia, sadness/tearfullness.  Cognitive assessment: the patient manages all their financial and personal affairs and is actively engaged. They could relate day,date,year and events; recalled 2/3 objects at 3 minutes; performed clock-face test normally.  The following portions of the patient's history were reviewed and updated as appropriate: allergies, current medications, past family history, past medical history,  past surgical history, past social history  and problem list.  Visual acuity was not assessed per patient preference since she has regular follow up with her ophthalmologist. Hearing and body mass index were assessed and reviewed.   During the course of the visit the patient was educated and counseled about appropriate screening and preventive services including : fall prevention , diabetes screening, nutrition counseling, colorectal cancer screening, and recommended immunizations.    CC: The primary encounter diagnosis was CKD (chronic kidney disease), stage II. Diagnoses of Aortic atherosclerosis ([place]), Essential hypertension, Lung nodule seen on imaging study, Thoracic ascending aortic aneurysm ([place]), and Pulmonary nodule, right were also pertinent to this visit.   BP 's measured daily and all < 110/80. Many under 100/80  ER VISIT IN September FOR POST TRAUMATIC CHEST WALL PAIN.  No rib fractures,  But 1) several findings that needed discussion were discussed today.  suspicious lung nodule noted  2) thoracic aortic aneurysm  1) lung nodule, spiculated:  3 month follow up advised but not done bc he was not aware of the findings. He denies weight loss,  Cough,   Gets sob only with vigorous physical labor   2) thoracic aneurysm and  atherosclerosis .  No prior vascular surgery evaluation,  No use of statin.     Sleeping well.  Nocturia x 2 most night . Drinks water in the evening. Not bothering him.  Weight stable,  On protocol for CHF, weighing daily   History [place] has a past medical history of BPH (benign prostatic hyperplasia), COPD (chronic obstructive pulmonary disease) ([place]), Elevated PSA, between 10 and less than 20 ng/ml, Hypertension, and Osteoporosis.   He has a past surgical history that includes Tympanostomy tube placement (2017).   His family history includes Cancer (age of onset: 51) in his brother; Diabetes in his mother; Hypertension in his father.He reports that he has quit smoking. He has never used smokeless tobacco. He reports that he does not drink alcohol. No history on file for drug.  Outpatient Medications Prior to Visit  Medication Sig Dispense Refill  . AMBULATORY NON FORMULARY MEDICATION Joint Advantage Gold 2 tablets daily    . calcium carbonate (OS-CAL) 600 MG TABS Take 600 mg by mouth 2 (two) times daily.    . carvedilol (COREG) 3.125 MG tablet Take 3.125 mg by mouth 2 (two) times daily with a meal.     . Cholecalciferol (VITAMIN D3) 400 units tablet Take by mouth 2 (two) times daily.    . dorzolamide-timolol (COSOPT) 22.3-6.8 MG/ML ophthalmic solution TAKE 1 DROP(S) IN RIGHT EYE 2 TIMES A DAY  5  . doxazosin (CARDURA) 2 MG tablet TAKE 1 TABLET (2 MG TOTAL) BY MOUTH AT BEDTIME. 90 tablet 1  . furosemide (LASIX) 20 MG tablet TAKE 2 TABLETS (40 MG TOTAL) BY MOUTH DAILY AS NEEDED. 180 tablet 1  . ipratropium-albuterol (DUONEB) 0.5-2.5 (3) MG/3ML SOLN Take 3 mLs by nebulization every 6 (six) hours as needed. 360 mL 11  . latanoprost (XALATAN) 0.005 % ophthalmic solution Place 1 drop into both eyes at bedtime.  5  . leuprolide, 6 Month, (ELIGARD) 45 MG injection Inject 45 mg into the skin every 6 (six) months.    Grant Ruts INHUB 250-50 MCG/DOSE AEPB INHALE 1 PUFF INTO THE LUNGS TWICE DAILY 180  each 3  . lidocaine (LIDODERM) 5 % Place 1 patch onto the skin every 12 (twelve) hours. Remove & Discard patch within 12 hours or as directed by MD (Patient not taking: Reported on 08/30/2019) 10 patch 0  . meloxicam (MOBIC) 15 MG tablet TAKE 1 TABLET (15 MG TOTAL) BY MOUTH DAILY AS NEEDED.    Marland Kitchen traMADol (ULTRAM) 50 MG tablet Take 0.5 tablets (25 mg total) by mouth every 6 (six) hours as needed. (Patient not taking: Reported on 08/30/2019) 10 tablet 0   No facility-administered medications prior to visit.    Review of Systems   Patient denies headache, fevers, malaise, unintentional weight loss, skin rash, eye pain, sinus congestion and sinus pain, sore throat, dysphagia,  hemoptysis , cough, dyspnea, wheezing, chest pain, palpitations, orthopnea, edema, abdominal pain, nausea, melena, diarrhea, constipation, flank pain, dysuria, hematuria, urinary  Frequency, nocturia, numbness, tingling, seizures,  Focal weakness, Loss of consciousness,  Tremor, insomnia, depression, anxiety, and suicidal ideation.     Objective:  BP 134/84 (BP Location: Left Arm, Patient Position: Sitting, Cuff Size: Normal)   Pulse 72   Temp Marland Kitchen)  97.5 F (36.4 C) (Temporal)   Resp 15   Ht 5\' 9"  (1.753 m)   Wt 174 lb 3.2 oz (79 kg)   SpO2 96%   BMI 25.72 kg/m   Physical Exam  General appearance: alert, cooperative and appears stated age Ears: normal TM's and external ear canals both ears Throat: lips, mucosa, and tongue normal; teeth and gums normal Neck: no adenopathy, no carotid bruit, supple, symmetrical, trachea midline and thyroid not enlarged, symmetric, no tenderness/mass/nodules Back: symmetric, no curvature. ROM normal. No CVA tenderness. Lungs: clear to auscultation bilaterally Heart: regular rate and rhythm, S1, S2 normal, no murmur, click, rub or gallop Abdomen: soft, non-tender; bowel sounds normal; no masses,  no organomegaly Pulses: 2+ and symmetric Skin: Skin color, texture, turgor normal. No rashes  or lesions Lymph nodes: Cervical, supraclavicular, and axillary nodes normal.   Assessment & Plan:   Problem List Items Addressed This Visit      Unprioritized   Aortic atherosclerosis [facility])    Discussed with his cardiologist PARSCHOS.  Statin advised,  Patient will not consider unless cardiologist agrees       Relevant Orders   Lipid panel (Completed)   CKD (chronic kidney disease), stage II - Primary   Relevant Orders   CBC with Differential/Platelet (Completed)   Hypertension    BPs have been soft .. reduce cardura dose to 1 mg daily       Relevant Orders   Comprehensive metabolic panel (Completed)   Pulmonary nodule, right    Discussed with patient today.  ddx includes lung ca,  Mets from prostate ca or other undiagnosed primary with mets.  Chest CT ordered and pulmonary follow up      Thoracic ascending aortic aneurysm [facility])    referral to vascular surgery for monitoring       Relevant Orders   Ambulatory referral to Vascular Surgery    Other Visit Diagnoses    Lung nodule seen on imaging study       Relevant Orders   CT CHEST NODULE FOLLOW UP LOW DOSE W/O      I have discontinued [name] C. Harnack "Clay"'s meloxicam, lidocaine, and traMADol. I am also having him maintain his calcium carbonate, AMBULATORY NON FORMULARY MEDICATION, Vitamin D3, dorzolamide-timolol, leuprolide (6 Month), latanoprost, ipratropium-albuterol, carvedilol, Wixela Inhub, doxazosin, and furosemide.  No orders of the defined types were placed in this encounter.   Medications Discontinued During This Encounter  Medication Reason  . lidocaine (LIDODERM) 5 % Patient has not taken in last 30 days  . meloxicam (MOBIC) 15 MG tablet Patient has not taken in last 30 days  . traMADol (ULTRAM) 50 MG tablet Patient has not taken in last 30 days    Follow-up: No follow-ups on file.   Crecencio Mc, MD

## 2019-09-01 ENCOUNTER — Encounter: Payer: Self-pay | Admitting: Internal Medicine

## 2019-09-01 DIAGNOSIS — R918 Other nonspecific abnormal finding of lung field: Secondary | ICD-10-CM | POA: Insufficient documentation

## 2019-09-01 DIAGNOSIS — R911 Solitary pulmonary nodule: Secondary | ICD-10-CM | POA: Insufficient documentation

## 2019-09-01 DIAGNOSIS — I712 Thoracic aortic aneurysm, without rupture: Secondary | ICD-10-CM | POA: Insufficient documentation

## 2019-09-01 DIAGNOSIS — C3410 Malignant neoplasm of upper lobe, unspecified bronchus or lung: Secondary | ICD-10-CM | POA: Insufficient documentation

## 2019-09-01 DIAGNOSIS — I7121 Aneurysm of the ascending aorta, without rupture: Secondary | ICD-10-CM | POA: Insufficient documentation

## 2019-09-01 NOTE — Assessment & Plan Note (Signed)
Discussed with his cardiologist PARSCHOS.  Statin advised,  Patient will not consider unless cardiologist agrees

## 2019-09-01 NOTE — Assessment & Plan Note (Signed)
Discussed with patient today.  ddx includes lung ca,  Mets from prostate ca or other undiagnosed primary with mets.  Chest CT ordered and pulmonary follow up

## 2019-09-01 NOTE — Assessment & Plan Note (Signed)
referral to vascular surgery for monitoring

## 2019-09-01 NOTE — Assessment & Plan Note (Signed)
BPs have been soft .. reduce cardura dose to 1 mg daily

## 2019-09-10 ENCOUNTER — Encounter (INDEPENDENT_AMBULATORY_CARE_PROVIDER_SITE_OTHER): Payer: Self-pay | Admitting: Vascular Surgery

## 2019-09-13 ENCOUNTER — Other Ambulatory Visit: Payer: Self-pay

## 2019-09-13 ENCOUNTER — Ambulatory Visit
Admission: RE | Admit: 2019-09-13 | Discharge: 2019-09-13 | Disposition: A | Discharge status: Home / Self Care | Payer: Medicare Other | Source: Ambulatory Visit | Attending: Internal Medicine | Admitting: Internal Medicine

## 2019-09-13 DIAGNOSIS — R911 Solitary pulmonary nodule: Secondary | ICD-10-CM | POA: Diagnosis not present

## 2019-09-14 ENCOUNTER — Other Ambulatory Visit: Payer: Self-pay | Admitting: Internal Medicine

## 2019-09-14 DIAGNOSIS — R911 Solitary pulmonary nodule: Secondary | ICD-10-CM

## 2019-09-17 ENCOUNTER — Encounter: Payer: Self-pay | Admitting: *Deleted

## 2019-09-17 DIAGNOSIS — R911 Solitary pulmonary nodule: Secondary | ICD-10-CM

## 2019-09-18 NOTE — Progress Notes (Signed)
  Oncology Nurse Navigator Documentation  Navigator Location: [facility] (09/17/19 1100)   )Navigator Encounter Type: Introductory Phone Call (09/17/19 1100)   Abnormal Finding Date: 09/14/19 (09/17/19 1100)                   Treatment Phase: Abnormal Scans (09/17/19 1100) Barriers/Navigation Needs: Coordination of Care (09/17/19 1100)   Interventions: Coordination of Care (09/17/19 1100)   Coordination of Care: Appts;Radiology (09/17/19 1100)           phone call made to patient to introduce to navigator services and review upcoming appts. Pt did not answer. Message left to call back to further discuss upcoming appts.        Time Spent with Patient: 30 (09/17/19 1100)

## 2019-09-18 NOTE — Telephone Encounter (Signed)
Patient is a 86 year old fe/male who had CT chest w/o constrast on Sep 13, 2019 which noted an irregular/spiculated nodule in the right lung apex measuring 11 x 19 mm . He has a history of tobacco use and had imaging to assess interval change of a known pulmonary nodule. Impression from scan was compatible with a primary bronchogenic lung carcinoma.    Patient has been referred to the [facility] and will require a PET scan for further evaluation and to optimize management.  PET scan is also needed to assist in further diagnostic studies including possible biopsy.

## 2019-09-19 NOTE — Progress Notes (Signed)
Patient called back and upcoming appts reviewed with patient. Pt verbalized understanding. Nothing further needed at this time.

## 2019-09-19 NOTE — Progress Notes (Signed)
Second attempt to contact pt to review upcoming appts. Message left on pt's home number to call back.

## 2019-09-20 ENCOUNTER — Ambulatory Visit (INDEPENDENT_AMBULATORY_CARE_PROVIDER_SITE_OTHER): Payer: Medicare Other | Admitting: Vascular Surgery

## 2019-09-20 ENCOUNTER — Other Ambulatory Visit: Payer: Self-pay

## 2019-09-20 VITALS — BP 181/70 | HR 61 | Ht 69.0 in | Wt 176.0 lb

## 2019-09-20 DIAGNOSIS — I480 Paroxysmal atrial fibrillation: Secondary | ICD-10-CM | POA: Diagnosis not present

## 2019-09-20 DIAGNOSIS — I10 Essential (primary) hypertension: Secondary | ICD-10-CM

## 2019-09-20 DIAGNOSIS — I712 Thoracic aortic aneurysm, without rupture: Secondary | ICD-10-CM

## 2019-09-20 DIAGNOSIS — M47812 Spondylosis without myelopathy or radiculopathy, cervical region: Secondary | ICD-10-CM | POA: Diagnosis not present

## 2019-09-20 DIAGNOSIS — I7121 Aneurysm of the ascending aorta, without rupture: Secondary | ICD-10-CM

## 2019-09-21 NOTE — Progress Notes (Signed)
[facility]  [phone] [phone]  ID: [name] OB: 1926-06-01  MR#: [id number]  ENM#:[id number]  Patient Care Team: Crecencio Mc, MD as PCP - General (Internal Medicine) Telford Nab, RN as Oncology Nurse Navigator  CHIEF COMPLAINT: Right upper lobe lung mass.  INTERVAL HISTORY: Patient is a 86 year old male was incidentally noted to have a right upper lobe pulmonary nodule on CT scan.  Repeat imaging 3 months later revealed slight progression of nodule.  PET scan have mild hypermetabolism, but concerning for malignancy.  He currently feels well and is asymptomatic.  He has no neurologic complaints.  He denies any recent fevers or illnesses.  He has a good appetite and denies weight loss.  He has no chest pain, shortness of breath, cough, or hemoptysis.  He denies any nausea, vomiting, constipation, or diarrhea.  He has no urinary complaints.  Patient feels at his baseline and offers no specific complaints today.  REVIEW OF SYSTEMS:   Review of Systems  Constitutional: Negative.  Negative for fever, malaise/fatigue and weight loss.  Respiratory: Negative.  Negative for cough, hemoptysis and shortness of breath.   Cardiovascular: Negative.  Negative for chest pain and leg swelling.  Gastrointestinal: Negative.  Negative for abdominal pain.  Genitourinary: Negative.  Negative for dysuria.  Musculoskeletal: Negative.  Negative for back pain.  Skin: Negative.  Negative for rash.  Neurological: Negative.  Negative for dizziness, focal weakness, weakness and headaches.  Psychiatric/Behavioral: Negative.  The patient is not nervous/anxious.     As per HPI. Otherwise, a complete review of systems is negative.  PAST MEDICAL HISTORY: Past Medical History:  Diagnosis Date  . BPH (benign prostatic hyperplasia)    managed by Olena Heckle  . COPD (chronic obstructive pulmonary disease) ([place])   . Elevated PSA, between 10 and less than 20 ng/ml     Olena Heckle, watchful waiting  . Hypertension   . Osteoporosis    by DEXA    PAST SURGICAL HISTORY: Past Surgical History:  Procedure Laterality Date  . TYMPANOSTOMY TUBE PLACEMENT  2017    FAMILY HISTORY: Family History  Problem Relation Age of Onset  . Diabetes Mother   . Hypertension Father   . Cancer Brother 40       multiple myeloma    ADVANCED DIRECTIVES (Y/N):  N  HEALTH MAINTENANCE: Social History   Tobacco Use  . Smoking status: Former Research scientist (life sciences)  . Smokeless tobacco: Never Used  Substance Use Topics  . Alcohol use: No  . Drug use: Not on file     Colonoscopy:  PAP:  Bone density:  Lipid panel:  Allergies  Allergen Reactions  . Tylenol [Acetaminophen] Rash    Current Outpatient Medications  Medication Sig Dispense Refill  . AMBULATORY NON FORMULARY MEDICATION Joint Advantage Gold 2 tablets daily    . calcium carbonate (OS-CAL) 600 MG TABS Take 600 mg by mouth daily with breakfast.     . carvedilol (COREG) 6.25 MG tablet Take 6.25 mg by mouth 2 (two) times daily.    . Cholecalciferol (VITAMIN D3) 400 units tablet Take 400 Units by mouth daily.     . dorzolamide-timolol (COSOPT) 22.3-6.8 MG/ML ophthalmic solution TAKE 1 DROP(S) IN RIGHT EYE 2 TIMES A DAY  5  . doxazosin (CARDURA) 1 MG tablet Take 1 mg by mouth daily.    . furosemide (LASIX) 20 MG tablet See admin instructions. Take 2 tablets (40 mg ) by mouth one day and 1 tab (20 mg) by mouth  every other day as needed    . ipratropium-albuterol (DUONEB) 0.5-2.5 (3) MG/3ML SOLN Take 3 mLs by nebulization every 6 (six) hours as needed. 360 mL 11  . latanoprost (XALATAN) 0.005 % ophthalmic solution Place 1 drop into both eyes at bedtime.  5  . leuprolide, 6 Month, (ELIGARD) 45 MG injection Inject 45 mg into the skin every 6 (six) months.    Grant Ruts INHUB 250-50 MCG/DOSE AEPB INHALE 1 PUFF INTO THE LUNGS TWICE DAILY 180 each 3  . furosemide (LASIX) 20 MG tablet TAKE 2 TABLETS (40 MG TOTAL) BY MOUTH DAILY AS  NEEDED. 180 tablet 1   No current facility-administered medications for this visit.    OBJECTIVE: Vitals:   09/28/19 0858  BP: (!) 146/87  Pulse: 64  Resp: 20  Temp: 97.8 F (36.6 C)  SpO2: 97%     Body mass index is 25.68 kg/m.    ECOG FS:0 - Asymptomatic  General: Well-developed, well-nourished, no acute distress. Eyes: Pink conjunctiva, anicteric sclera. HEENT: Normocephalic, moist mucous membranes. Lungs: No audible wheezing or coughing. Heart: Regular rate and rhythm. Abdomen: Soft, nontender, no obvious distention. Musculoskeletal: No edema, cyanosis, or clubbing. Neuro: Alert, answering all questions appropriately. Cranial nerves grossly intact. Skin: No rashes or petechiae noted. Psych: Normal affect. Lymphatics: No cervical, calvicular, axillary or inguinal LAD.   LAB RESULTS:  Lab Results  Component Value Date   NA 143 08/30/2019   K 3.8 08/30/2019   CL 107 08/30/2019   CO2 29 08/30/2019   GLUCOSE 89 08/30/2019   BUN 29 (H) 08/30/2019   CREATININE 1.69 (H) 08/30/2019   CALCIUM 9.2 08/30/2019   PROT 6.0 08/30/2019   ALBUMIN 3.8 08/30/2019   AST 16 08/30/2019   ALT 8 08/30/2019   ALKPHOS 57 08/30/2019   BILITOT 0.7 08/30/2019   GFRNONAA 35 (L) 11/12/2011   GFRAA 40 (L) 11/12/2011    Lab Results  Component Value Date   WBC 5.9 08/30/2019   NEUTROABS 3.5 08/30/2019   HGB 13.2 08/30/2019   HCT 39.1 08/30/2019   MCV 95.2 08/30/2019   PLT 187.0 08/30/2019     STUDIES: CT CHEST WO CONTRAST  Result Date: 09/14/2019 CLINICAL DATA:  Follow-up pulmonary nodule EXAM: CT CHEST WITHOUT CONTRAST TECHNIQUE: Multidetector CT imaging of the chest was performed following the standard protocol without IV contrast. COMPARISON:  CT chest dated 01/16/2019 FINDINGS: Cardiovascular: Mild cardiomegaly.  No pericardial effusion. 4.4 cm ascending thoracic aortic aneurysm (coronal image 57). Mild atherosclerotic calcifications of the aortic arch. Three vessel coronary  atherosclerosis. Mediastinum/Nodes: No suspicious mediastinal lymphadenopathy. Visualized thyroid is unremarkable. Lungs/Pleura: Architectural distortion with irregular/spiculated nodule in the right lung apex measuring 11 x 19 mm (series 2/image 33), previously 9 x 18 mm. Mildly progressive solid soft tissue component (series 2/image 34). 3 mm subpleural calcified granuloma in the right middle lobe (series 2/image 78), benign. Mild scarring/atelectasis in the right middle lobe and lingula. No focal consolidation. Mild centrilobular and paraseptal emphysematous changes. Biapical pleural-parenchymal scarring. No pleural effusion or pneumothorax. Upper Abdomen: Visualized upper abdomen is notable for nonobstructing bilateral renal calculi and vascular calcifications. Musculoskeletal: Degenerative changes of the visualized thoracolumbar spine. IMPRESSION: Mildly progressive irregular/spiculated nodule in the right lung apex, as above. This appearance is worrisome for primary bronchogenic neoplasm. Discussion at multidisciplinary tumor board is suggested. Consider PET-CT as clinically warranted. No findings suspicious for metastatic disease. Stable 4.4 cm ascending thoracic aortic aneurysm. Recommend annual imaging followup by CTA or MRA. This recommendation follows 2010  ACCF/AHA/AATS/ACR/ASA/SCA/SCAI/SIR/STS/SVM Guidelines for the Diagnosis and Management of Patients with Thoracic Aortic Disease. Circulation. 2010; 121: E100-F121. Aortic aneurysm NOS (ICD10-I71.9) Aortic Atherosclerosis (ICD10-I70.0) and Emphysema (ICD10-J43.9). Electronically Signed   By: Julian Hy M.D.   On: 09/14/2019 07:33   NM PET Image Initial (PI) Skull Base To Thigh  Result Date: 09/27/2019 CLINICAL DATA:  Initial treatment strategy for right apical lung nodule. EXAM: NUCLEAR MEDICINE PET SKULL BASE TO THIGH TECHNIQUE: 8.8 mCi F-18 FDG was injected intravenously. Full-ring PET imaging was performed from the skull base to thigh after  the radiotracer. CT data was obtained and used for attenuation correction and anatomic localization. Fasting blood glucose: 82 mg/dl COMPARISON:  CT chest 09/13/2019 FINDINGS: Mediastinal blood pool activity: SUV max 1.7 Liver activity: SUV max NA NECK: Mildly asymmetric uptake in the left tonsillar region with SUV max = 3.8. No hypermetabolic cervical lymphadenopathy. Incidental CT findings: none CHEST: Low level FDG accumulation is identified in the spiculated irregular right upper lobe pulmonary nodule identified is progressive on the recent chest CT. SUV max = 2.1. Focal hypermetabolism in the right hilum demonstrates SUV max = 4.8. No discrete lymph node identified on this noncontrast study or previous chest CT performed without intravenous contrast. Other foci of more modest FDG uptake also evident in the right hilar region. No evidence for hypermetabolic mediastinal or left hilar lymphadenopathy although there is a small focus of FDG accumulation in the left hilum without underlying CT correlate. Incidental CT findings: Heart is enlarged. Coronary artery calcification is evident. Ascending thoracic aorta measures up to 4.7 cm diameter today. ABDOMEN/PELVIS: No abnormal hypermetabolic activity within the liver, pancreas, adrenal glands, or spleen. No hypermetabolic lymph nodes in the abdomen or pelvis. Incidental CT findings: Nonobstructing stones noted in both kidneys. There is abdominal aortic atherosclerosis without aneurysm. Diverticular changes noted left colon without diverticulitis. Prostate gland is enlarged. SKELETON: No focal hypermetabolic activity to suggest skeletal metastasis. Incidental CT findings: No worrisome lytic or sclerotic osseous abnormality. IMPRESSION: 1. The enlarging spiculated right upper lobe pulmonary lesion shows low level FDG accumulation. Detectable FDG accumulation in a lesion of this size/configuration does raise concern for neoplasm. 2. Focal hypermetabolism in the right  hilum is more prominent than the uptake in the right upper lobe lesion, but raises concern for metastatic disease. Renal function permitting, CT chest with contrast would likely prove helpful to further evaluate as no underlying discrete lymph nodes are evident on this noncontrast exam. 3. Asymmetric mild FDG accumulation in the left tonsillar region. Direct visualization may be warranted to exclude mucosal lesion. 4. 4.7 cm ascending thoracic aortic aneurysm. Recommend semi-annual imaging followup by CTA or MRA and referral to cardiothoracic surgery if not already obtained. This recommendation follows 2010 ACCF/AHA/AATS/ACR/ASA/SCA/SCAI/SIR/STS/SVM Guidelines for the Diagnosis and Management of Patients With Thoracic Aortic Disease. Circulation. 2010; 121: F758-I325. Aortic aneurysm NOS (ICD10-I71.9) 5. Nephrolithiasis, nonobstructing. Electronically Signed   By: Misty Stanley M.D.   On: 09/27/2019 09:23    ASSESSMENT: Right upper lobe lung mass.  PLAN:    1. Right upper lobe lung mass: CT scan results from Sep 13, 2019 as well as PET scan results from September 26, 2019 reviewed independently and reported as above.  Patient noted to have an 11 x 19 mm right apex nodule with mild hypermetabolism.  This is increased in size from 3 months previous were it measured 9 x 18 mm.  Case discussed at multidisciplinary tumor board and consensus was this is likely a low-grade malignancy.  No  intervention is needed at this time.  We discussed the possibility of pursuing biopsy and treatment, but patient wishes to continue to follow simple observation.  He did agree to biopsy in the future if necessary.  Will repeat CT scan in 3 months to assess for interval change.  Patient will then return to clinic 1 to 2 days later to discuss the results.  I spent a total of 45 minutes reviewing chart data, face-to-face evaluation with the patient, counseling and coordination of care as detailed above.   Patient expressed  understanding and was in agreement with this plan. He also understands that He can call clinic at any time with any questions, concerns, or complaints.   Cancer Staging No matching staging information was found for the patient.  Lloyd Huger, MD   09/28/2019 9:55 AM

## 2019-09-22 ENCOUNTER — Encounter (INDEPENDENT_AMBULATORY_CARE_PROVIDER_SITE_OTHER): Payer: Self-pay | Admitting: Vascular Surgery

## 2019-09-22 NOTE — Progress Notes (Signed)
MRN : [id number]  Eric Hicks is a 86 y.o. (March 29, 1927) male who presents with chief complaint of  Chief Complaint  Patient presents with   New Patient (Initial Visit)    Thoracic aneursym   .  History of Present Illness:   The patient presents to the office for evaluation of an ascending aortic aneurysm. The aneurysm was found incidentally by CT scan. Patient denies chest pain or unusual back pain, no other abdominal complaints.  No history of an acute onset of painful blue discoloration of the toes.     No family history of TAA/AAA.   Patient denies amaurosis fugax or TIA symptoms. There is no history of claudication or rest pain symptoms of the lower extremities.  The patient denies angina or shortness of breath.  CT scan shows an TAA that measures 4.70 cm  No outpatient medications have been marked as taking for the 09/20/19 encounter (Office Visit) with Delana Meyer, Dolores Lory, MD.    Past Medical History:  Diagnosis Date   BPH (benign prostatic hyperplasia)    managed by Olena Heckle   COPD (chronic obstructive pulmonary disease) ([place])    Elevated PSA, between 10 and less than 20 ng/ml    Olena Heckle, watchful waiting   Hypertension    Osteoporosis    by DEXA    Past Surgical History:  Procedure Laterality Date   TYMPANOSTOMY TUBE PLACEMENT  2017    Social History Social History   Tobacco Use   Smoking status: Former Smoker   Smokeless tobacco: Never Used  Substance Use Topics   Alcohol use: No   Drug use: Not on file    Family History Family History  Problem Relation Age of Onset   Diabetes Mother    Hypertension Father    Cancer Brother 41       multiple myeloma  No family history of bleeding/clotting disorders, porphyria or autoimmune disease   Allergies  Allergen Reactions   Tylenol [Acetaminophen] Rash     REVIEW OF SYSTEMS (Negative unless checked)  Constitutional: _0 Weight loss  _1 Fever  _2 Chills Cardiac: _3 Chest pain   _4 Chest  pressure   _5 Palpitations   _6 Shortness of breath when laying flat   _7 Shortness of breath with exertion. Vascular:  _8 Pain in legs with walking   _9 Pain in legs at rest  _10 History of DVT   _11 Phlebitis   _12 Swelling in legs   _13 Varicose veins   _14 Non-healing ulcers Pulmonary:   _15 Uses home oxygen   _16 Productive cough   _17 Hemoptysis   _18 Wheeze  _19 COPD   _20 Asthma Neurologic:  _21 Dizziness   _22 Seizures   _23 History of stroke   _24 History of TIA  _25 Aphasia   _26 Vissual changes   _27 Weakness or numbness in arm   _28 Weakness or numbness in leg Musculoskeletal:   _29 Joint swelling   _30 Joint pain   _31 Low back pain Hematologic:  _32 Easy bruising  _33 Easy bleeding   _34 Hypercoagulable state   _35 Anemic Gastrointestinal:  _36 Diarrhea   _37 Vomiting  _38 Gastroesophageal reflux/heartburn   _39 Difficulty swallowing. Genitourinary:  _40 Chronic kidney disease   _41 Difficult urination  _42 Frequent urination   _43 Blood in urine Skin:  _44 Rashes   _45 Ulcers  Psychological:  _46 History of anxiety   _47  History of major depression.  Physical Examination  Vitals:   09/20/19 1510  BP: (!) 181/70  Pulse: 61  Weight: 176 lb (79.8 kg)  Height: _48  (1.753 m)   Body mass index is 25.99 kg/m. Gen: WD/WN, NAD Head: [place]/AT, No temporalis wasting.  Ear/Nose/Throat: Hearing grossly  intact, nares w/o erythema or drainage, poor dentition Eyes: PER, EOMI, sclera nonicteric.  Neck: Supple, no masses.  No bruit or JVD.  Pulmonary:  Good air movement, clear to auscultation bilaterally, no use of accessory muscles.  Cardiac: RRR, normal S1, S2, no Murmurs. Vascular:  Vessel Right Left  Radial Palpable Palpable  Gastrointestinal: soft, non-distended. No guarding/no peritoneal signs.  Musculoskeletal: M/S 5/5 throughout.  No deformity or atrophy.  Neurologic: CN 2-12 intact. Pain and light touch intact in extremities.  Symmetrical.  Speech is fluent. Motor exam as listed above. Psychiatric: Judgment intact, Mood & affect appropriate for pt's  clinical situation. Dermatologic: No rashes or ulcers noted.  No changes consistent with cellulitis.   CBC Lab Results  Component Value Date   WBC 5.9 08/30/2019   HGB 13.2 08/30/2019   HCT 39.1 08/30/2019   MCV 95.2 08/30/2019   PLT 187.0 08/30/2019    BMET    Component Value Date/Time   NA 143 08/30/2019 0951   K 3.8 08/30/2019 0951   CL 107 08/30/2019 0951   CO2 29 08/30/2019 0951   GLUCOSE 89 08/30/2019 0951   BUN 29 (H) 08/30/2019 0951   CREATININE 1.69 (H) 08/30/2019 0951   CREATININE 1.50 (H) 10/02/2018 1511   CALCIUM 9.2 08/30/2019 0951   GFRNONAA 35 (L) 11/12/2011 0839   GFRAA 40 (L) 11/12/2011 0839   CrCl cannot be calculated (Patient's most recent lab result is older than the maximum 21 days allowed.).  COAG No results found for: INR, PROTIME  Radiology CT CHEST WO CONTRAST  Result Date: 09/14/2019 CLINICAL DATA:  Follow-up pulmonary nodule EXAM: CT CHEST WITHOUT CONTRAST TECHNIQUE: Multidetector CT imaging of the chest was performed following the standard protocol without IV contrast. COMPARISON:  CT chest dated 01/16/2019 FINDINGS: Cardiovascular: Mild cardiomegaly.  No pericardial effusion. 4.4 cm ascending thoracic aortic aneurysm (coronal image 57). Mild atherosclerotic calcifications of the aortic arch. Three vessel coronary atherosclerosis. Mediastinum/Nodes: No suspicious mediastinal lymphadenopathy. Visualized thyroid is unremarkable. Lungs/Pleura: Architectural distortion with irregular/spiculated nodule in the right lung apex measuring 11 x 19 mm (series 2/image 33), previously 9 x 18 mm. Mildly progressive solid soft tissue component (series 2/image 34). 3 mm subpleural calcified granuloma in the right middle lobe (series 2/image 78), benign. Mild scarring/atelectasis in the right middle lobe and lingula. No focal consolidation. Mild centrilobular and paraseptal emphysematous changes. Biapical pleural-parenchymal scarring. No pleural effusion or  pneumothorax. Upper Abdomen: Visualized upper abdomen is notable for nonobstructing bilateral renal calculi and vascular calcifications. Musculoskeletal: Degenerative changes of the visualized thoracolumbar spine. IMPRESSION: Mildly progressive irregular/spiculated nodule in the right lung apex, as above. This appearance is worrisome for primary bronchogenic neoplasm. Discussion at multidisciplinary tumor board is suggested. Consider PET-CT as clinically warranted. No findings suspicious for metastatic disease. Stable 4.4 cm ascending thoracic aortic aneurysm. Recommend annual imaging followup by CTA or MRA. This recommendation follows 2010 ACCF/AHA/AATS/ACR/ASA/SCA/SCAI/SIR/STS/SVM Guidelines for the Diagnosis and Management of Patients with Thoracic Aortic Disease. Circulation. 2010; 121: Z610-R604. Aortic aneurysm NOS (ICD10-I71.9) Aortic Atherosclerosis (ICD10-I70.0) and Emphysema (ICD10-J43.9). Electronically Signed   By: Julian Hy M.D.   On: 09/14/2019 07:33     Assessment/Plan 1. Thoracic ascending aortic aneurysm [facility]) The patient has a small Ascending TAA that is actually smaller on the CT from May compared to September 2020.  Given his age I do not recommend any further follow up  2. Paroxysmal atrial fibrillation (HCC) Continue antiarrhythmia medications as already ordered, these medications have been reviewed and there are  no changes at this time.  Continue anticoagulation as ordered by Cardiology Service   3. Essential hypertension Continue antihypertensive medications as already ordered, these medications have been reviewed and there are no changes at this time.   4. Spondylosis of cervical region without myelopathy or radiculopathy Continue NSAID medications as already ordered, these medications have been reviewed and there are no changes at this time.  Continued activity and therapy was stressed.     Hortencia Pilar, MD  09/22/2019 2:50 PM

## 2019-09-26 ENCOUNTER — Ambulatory Visit
Admission: RE | Admit: 2019-09-26 | Discharge: 2019-09-26 | Disposition: A | Discharge status: Home / Self Care | Payer: Medicare Other | Source: Ambulatory Visit | Attending: Oncology | Admitting: Oncology

## 2019-09-26 ENCOUNTER — Other Ambulatory Visit: Payer: Self-pay

## 2019-09-26 DIAGNOSIS — R911 Solitary pulmonary nodule: Secondary | ICD-10-CM | POA: Insufficient documentation

## 2019-09-26 DIAGNOSIS — I712 Thoracic aortic aneurysm, without rupture: Secondary | ICD-10-CM | POA: Diagnosis not present

## 2019-09-26 DIAGNOSIS — N200 Calculus of kidney: Secondary | ICD-10-CM | POA: Insufficient documentation

## 2019-09-26 DIAGNOSIS — J449 Chronic obstructive pulmonary disease, unspecified: Secondary | ICD-10-CM | POA: Insufficient documentation

## 2019-09-26 DIAGNOSIS — Z8546 Personal history of malignant neoplasm of prostate: Secondary | ICD-10-CM | POA: Diagnosis not present

## 2019-09-26 LAB — GLUCOSE, CAPILLARY: Glucose-Capillary: 82 mg/dL (ref 70–99)

## 2019-09-26 MED ORDER — FLUDEOXYGLUCOSE F - 18 (FDG) INJECTION
8.8400 | Freq: Once | INTRAVENOUS | Status: AC | PRN
  Administered 2019-09-26: 8.84 via INTRAVENOUS

## 2019-09-27 ENCOUNTER — Other Ambulatory Visit: Payer: Medicare Other

## 2019-09-28 ENCOUNTER — Inpatient Hospital Stay: Payer: Medicare Other | Attending: Oncology | Admitting: Oncology

## 2019-09-28 ENCOUNTER — Encounter: Payer: Self-pay | Admitting: *Deleted

## 2019-09-28 ENCOUNTER — Encounter: Payer: Self-pay | Admitting: Oncology

## 2019-09-28 ENCOUNTER — Other Ambulatory Visit: Payer: Self-pay

## 2019-09-28 VITALS — BP 146/87 | HR 64 | Temp 97.8°F | Resp 20 | Wt 173.9 lb

## 2019-09-28 DIAGNOSIS — R972 Elevated prostate specific antigen [PSA]: Secondary | ICD-10-CM | POA: Diagnosis not present

## 2019-09-28 DIAGNOSIS — N400 Enlarged prostate without lower urinary tract symptoms: Secondary | ICD-10-CM | POA: Insufficient documentation

## 2019-09-28 DIAGNOSIS — R918 Other nonspecific abnormal finding of lung field: Secondary | ICD-10-CM

## 2019-09-28 DIAGNOSIS — Z79899 Other long term (current) drug therapy: Secondary | ICD-10-CM | POA: Diagnosis not present

## 2019-09-28 DIAGNOSIS — I119 Hypertensive heart disease without heart failure: Secondary | ICD-10-CM | POA: Diagnosis not present

## 2019-09-28 DIAGNOSIS — Z87891 Personal history of nicotine dependence: Secondary | ICD-10-CM | POA: Diagnosis not present

## 2019-09-28 DIAGNOSIS — J449 Chronic obstructive pulmonary disease, unspecified: Secondary | ICD-10-CM | POA: Diagnosis not present

## 2019-09-28 NOTE — Progress Notes (Signed)
Tumor Board Documentation  Eric Hicks was presented by Norvel Richards, RN at our Tumor Board on 09/27/2019, which included representatives from medical oncology, radiation oncology, navigation, pathology, radiology, surgical, internal medicine, pharmacy, genetics, pulmonology, palliative care, research.  Eric Hicks currently presents as a new patient, for discussion with history of the following treatments: active survellience.  Additionally, we reviewed previous medical and familial history, history of present illness, and recent lab results along with all available histopathologic and imaging studies. The tumor board considered available treatment options and made the following recommendations: Active surveillance    The following procedures/referrals were also placed: No orders of the defined types were placed in this encounter.   Clinical Trial Status: not discussed   Staging used: Not Applicable  National site-specific guidelines   were discussed with respect to the case.  Tumor board is a meeting of clinicians from various specialty areas who evaluate and discuss patients for whom a multidisciplinary approach is being considered. Final determinations in the plan of care are those of the provider(s). The responsibility for follow up of recommendations given during tumor board is that of the provider.   Todays extended care, comprehensive team conference, Eric Hicks was not present for the discussion and was not examined.   Multidisciplinary Tumor Board is a multidisciplinary case peer review process.  Decisions discussed in the Multidisciplinary Tumor Board reflect the opinions of the specialists present at the conference without having examined the patient.  Ultimately, treatment and diagnostic decisions rest with the primary provider(s) and the patient.

## 2019-09-28 NOTE — Progress Notes (Signed)
  Oncology Nurse Navigator Documentation  Navigator Location: [facility]-Med Onc (09/28/19 0900)   )Navigator Encounter Type: Clinic/MDC (09/28/19 0900)               Multidisiplinary Clinic Date: 09/28/19 (09/28/19 0900) Multidisiplinary Clinic Type: Thoracic (09/28/19 0900)   Patient Visit Type: MedOnc (09/28/19 0900)   Barriers/Navigation Needs: Coordination of Care (09/28/19 0900)   Interventions: Coordination of Care (09/28/19 0900)   Coordination of Care: Appts;Radiology (09/28/19 0900)        Acuity: Level 2-Minimal Needs (1-2 Barriers Identified) (09/28/19 0900)      met with patient during initial consult with Dr. Grayland Ormond to discuss PET scan results and next steps. All questions answered during visit. Reviewed upcoming appts with patient. Contact info given and instructed to call with any further questions or needs. Pt verbalized understanding. Nothing further needed at this time.    Time Spent with Patient: 30 (09/28/19 0900)

## 2019-09-28 NOTE — Progress Notes (Signed)
Patient denies any pain or concerns at this time.

## 2019-10-03 ENCOUNTER — Ambulatory Visit: Payer: Medicare Other

## 2019-10-04 ENCOUNTER — Encounter: Payer: Self-pay | Admitting: Internal Medicine

## 2019-10-04 ENCOUNTER — Ambulatory Visit (INDEPENDENT_AMBULATORY_CARE_PROVIDER_SITE_OTHER): Payer: Medicare Other | Admitting: Internal Medicine

## 2019-10-04 ENCOUNTER — Ambulatory Visit (INDEPENDENT_AMBULATORY_CARE_PROVIDER_SITE_OTHER): Payer: Medicare Other

## 2019-10-04 ENCOUNTER — Other Ambulatory Visit: Payer: Self-pay

## 2019-10-04 VITALS — BP 166/90 | HR 58 | Temp 97.5°F | Ht 69.02 in | Wt 174.4 lb

## 2019-10-04 VITALS — BP 166/90 | HR 58 | Temp 97.4°F | Ht 69.0 in | Wt 174.4 lb

## 2019-10-04 DIAGNOSIS — I700 Atherosclerosis of aorta: Secondary | ICD-10-CM

## 2019-10-04 DIAGNOSIS — M810 Age-related osteoporosis without current pathological fracture: Secondary | ICD-10-CM

## 2019-10-04 DIAGNOSIS — Z0000 Encounter for general adult medical examination without abnormal findings: Secondary | ICD-10-CM

## 2019-10-04 DIAGNOSIS — C44222 Squamous cell carcinoma of skin of right ear and external auricular canal: Secondary | ICD-10-CM | POA: Diagnosis not present

## 2019-10-04 DIAGNOSIS — N401 Enlarged prostate with lower urinary tract symptoms: Secondary | ICD-10-CM | POA: Diagnosis not present

## 2019-10-04 DIAGNOSIS — R918 Other nonspecific abnormal finding of lung field: Secondary | ICD-10-CM | POA: Diagnosis not present

## 2019-10-04 DIAGNOSIS — R3914 Feeling of incomplete bladder emptying: Secondary | ICD-10-CM

## 2019-10-04 DIAGNOSIS — I420 Dilated cardiomyopathy: Secondary | ICD-10-CM

## 2019-10-04 NOTE — Patient Instructions (Signed)
The bump on your right ear looks like a skin cancer  I have made a referral to [facility] ( Dr Will Bonnet)  Please check your blood pressure once a week and notify me when you get 3 readings in a row above   Please resume a daily walk   See you in 6 months

## 2019-10-04 NOTE — Patient Instructions (Addendum)
  Eric Hicks , Thank you for taking time to come for your Medicare Wellness Visit. I appreciate your ongoing commitment to your health goals. Please review the following plan we discussed and let me know if I can assist you in the future.   These are the goals we discussed: Goals      Patient Stated   .  Follow up with Primary Care Provider (pt-stated)      As needed Maintain weight       This is a list of the screening recommended for you and due dates:  Health Maintenance  Topic Date Due  . Flu Shot  11/25/2019  . Tetanus Vaccine  06/13/2022  . COVID-19 Vaccine  Completed  . Pneumonia vaccines  Completed

## 2019-10-04 NOTE — Progress Notes (Signed)
Subjective:  Patient ID: Eric Hicks, male    DOB: 03-09-1927  Age: 86 y.o. MRN: [id number]  CC: The primary encounter diagnosis was Squamous cell skin cancer, earlobe, right. Diagnoses of Aortic atherosclerosis ([place]), Benign prostatic hyperplasia with incomplete bladder emptying, Mass of upper lobe of right lung, Age-related osteoporosis without current pathological fracture, and Nonischemic dilated cardiomyopathy ([place]) were also pertinent to this visit.  HPI Eric Hicks presents for FOLLOW UP on multiple issues raised at last visit  This visit occurred during the SARS-CoV-2 public health emergency.  Safety protocols were in place, including screening questions prior to the visit, additional usage of staff PPE, and extensive cleaning of exam room while observing appropriate contact   time as indicated for disinfecting solutions.   1) Incidental finding on CT of Thoracic aortic aneursym:  He was referred to Vascular for management of 4.7 cm aneurysm .   Given his age and its size, No further workup was advised. Management recommended with strict control of blood pressure  .    2) HTN: home readings have been done at least twice weekly and the average has been 110/60 . He   Has white coat hypertension  3) RUL spiculated Lung mass:  He was referred to oncology and was evaluated by A[facility]a [facility]. PET scan supportive of neoplasm etiology with possible right hilar metastatic disease.   CT scan to be repeated in September,   4)  He has developed a nonhealing lesion on his right ear, on the helix , which bleeds when scratched, .  Has not seen dermatology since 2020   Outpatient Medications Prior to Visit  Medication Sig Dispense Refill  . AMBULATORY NON FORMULARY MEDICATION Joint Advantage Gold 2 tablets daily    . calcium carbonate (OS-CAL) 600 MG TABS Take 600 mg by mouth daily with breakfast.     . carvedilol (COREG) 6.25 MG tablet Take 6.25 mg by mouth 2 (two) times daily.    .  Cholecalciferol (VITAMIN D3) 400 units tablet Take 400 Units by mouth daily.     . dorzolamide-timolol (COSOPT) 22.3-6.8 MG/ML ophthalmic solution TAKE 1 DROP(S) IN RIGHT EYE 2 TIMES A DAY  5  . doxazosin (CARDURA) 1 MG tablet Take 1 mg by mouth daily.    . furosemide (LASIX) 20 MG tablet TAKE 2 TABLETS (40 MG TOTAL) BY MOUTH DAILY AS NEEDED. 180 tablet 1  . furosemide (LASIX) 20 MG tablet See admin instructions. Take 2 tablets (40 mg ) by mouth one day and 1 tab (20 mg) by mouth every other day as needed    . ipratropium-albuterol (DUONEB) 0.5-2.5 (3) MG/3ML SOLN Take 3 mLs by nebulization every 6 (six) hours as needed. 360 mL 11  . latanoprost (XALATAN) 0.005 % ophthalmic solution Place 1 drop into both eyes at bedtime.  5  . leuprolide, 6 Month, (ELIGARD) 45 MG injection Inject 45 mg into the skin every 6 (six) months.    Grant Ruts INHUB 250-50 MCG/DOSE AEPB INHALE 1 PUFF INTO THE LUNGS TWICE DAILY 180 each 3   No facility-administered medications prior to visit.    Review of Systems;  Patient denies headache, fevers, malaise, unintentional weight loss, skin rash, eye pain, sinus congestion and sinus pain, sore throat, dysphagia,  hemoptysis , cough, dyspnea, wheezing, chest pain, palpitations, orthopnea, edema, abdominal pain, nausea, melena, diarrhea, constipation, flank pain, dysuria, hematuria, urinary  Frequency, nocturia, numbness, tingling, seizures,  Focal weakness, Loss of consciousness,  Tremor, insomnia, depression, anxiety, and  suicidal ideation.      Objective:  BP (!) 166/90 (BP Location: Left Arm, Patient Position: Sitting)   Pulse (!) 58   Temp (!) 97.5 F (36.4 C)   Ht 5' 9.02" (1.753 m)   Wt 174 lb 6.4 oz (79.1 kg)   SpO2 95%   BMI 25.74 kg/m   BP Readings from Last 3 Encounters:  10/04/19 (!) 166/90  10/04/19 (!) 166/90  09/28/19 (!) 146/87    Wt Readings from Last 3 Encounters:  10/04/19 174 lb 6.4 oz (79.1 kg)  10/04/19 174 lb 6.4 oz (79.1 kg)  09/28/19  173 lb 14.4 oz (78.9 kg)    General appearance: alert, cooperative and appears stated age Ears: normal TM's and external ear canals both ears. Right helix with purple raised papule,  Suspicious for CA Throat: lips, mucosa, and tongue normal; teeth and gums normal Neck: no adenopathy, no carotid bruit, supple, symmetrical, trachea midline and thyroid not enlarged, symmetric, no tenderness/mass/nodules Back: symmetric, no curvature. ROM normal. No CVA tenderness. Lungs: clear to auscultation bilaterally Heart: regular rate and rhythm, S1, S2 normal, no murmur, click, rub or gallop Abdomen: soft, non-tender; bowel sounds normal; no masses,  no organomegaly Pulses: 2+ and symmetric Skin: Skin color, texture, turgor normal. No rashes or lesions Lymph nodes: Cervical, supraclavicular, and axillary nodes normal.  No results found for: HGBA1C  Lab Results  Component Value Date   CREATININE 1.69 (H) 08/30/2019   CREATININE 1.50 (H) 10/02/2018   CREATININE 1.75 (H) 07/10/2018    Lab Results  Component Value Date   WBC 5.9 08/30/2019   HGB 13.2 08/30/2019   HCT 39.1 08/30/2019   PLT 187.0 08/30/2019   GLUCOSE 89 08/30/2019   CHOL 123 08/30/2019   TRIG 56.0 08/30/2019   HDL 64.70 08/30/2019   LDLCALC 47 08/30/2019   ALT 8 08/30/2019   AST 16 08/30/2019   NA 143 08/30/2019   K 3.8 08/30/2019   CL 107 08/30/2019   CREATININE 1.69 (H) 08/30/2019   BUN 29 (H) 08/30/2019   CO2 29 08/30/2019   TSH 1.69 06/14/2018   PSA 13.67 (H) 05/15/2014   MICROALBUR <0.7 06/20/2014    NM PET Image Initial (PI) Skull Base To Thigh  Result Date: 09/27/2019 CLINICAL DATA:  Initial treatment strategy for right apical lung nodule. EXAM: NUCLEAR MEDICINE PET SKULL BASE TO THIGH TECHNIQUE: 8.8 mCi F-18 FDG was injected intravenously. Full-ring PET imaging was performed from the skull base to thigh after the radiotracer. CT data was obtained and used for attenuation correction and anatomic localization.  Fasting blood glucose: 82 mg/dl COMPARISON:  CT chest 09/13/2019 FINDINGS: Mediastinal blood pool activity: SUV max 1.7 Liver activity: SUV max NA NECK: Mildly asymmetric uptake in the left tonsillar region with SUV max = 3.8. No hypermetabolic cervical lymphadenopathy. Incidental CT findings: none CHEST: Low level FDG accumulation is identified in the spiculated irregular right upper lobe pulmonary nodule identified is progressive on the recent chest CT. SUV max = 2.1. Focal hypermetabolism in the right hilum demonstrates SUV max = 4.8. No discrete lymph node identified on this noncontrast study or previous chest CT performed without intravenous contrast. Other foci of more modest FDG uptake also evident in the right hilar region. No evidence for hypermetabolic mediastinal or left hilar lymphadenopathy although there is a small focus of FDG accumulation in the left hilum without underlying CT correlate. Incidental CT findings: Heart is enlarged. Coronary artery calcification is evident. Ascending thoracic aorta measures up to  4.7 cm diameter today. ABDOMEN/PELVIS: No abnormal hypermetabolic activity within the liver, pancreas, adrenal glands, or spleen. No hypermetabolic lymph nodes in the abdomen or pelvis. Incidental CT findings: Nonobstructing stones noted in both kidneys. There is abdominal aortic atherosclerosis without aneurysm. Diverticular changes noted left colon without diverticulitis. Prostate gland is enlarged. SKELETON: No focal hypermetabolic activity to suggest skeletal metastasis. Incidental CT findings: No worrisome lytic or sclerotic osseous abnormality. IMPRESSION: 1. The enlarging spiculated right upper lobe pulmonary lesion shows low level FDG accumulation. Detectable FDG accumulation in a lesion of this size/configuration does raise concern for neoplasm. 2. Focal hypermetabolism in the right hilum is more prominent than the uptake in the right upper lobe lesion, but raises concern for  metastatic disease. Renal function permitting, CT chest with contrast would likely prove helpful to further evaluate as no underlying discrete lymph nodes are evident on this noncontrast exam. 3. Asymmetric mild FDG accumulation in the left tonsillar region. Direct visualization may be warranted to exclude mucosal lesion. 4. 4.7 cm ascending thoracic aortic aneurysm. Recommend semi-annual imaging followup by CTA or MRA and referral to cardiothoracic surgery if not already obtained. This recommendation follows 2010 ACCF/AHA/AATS/ACR/ASA/SCA/SCAI/SIR/STS/SVM Guidelines for the Diagnosis and Management of Patients With Thoracic Aortic Disease. Circulation. 2010; 121: B017-P102. Aortic aneurysm NOS (ICD10-I71.9) 5. Nephrolithiasis, nonobstructing. Electronically Signed   By: Misty Stanley M.D.   On: 09/27/2019 09:23    Assessment & Plan:   Problem List Items Addressed This Visit      Unprioritized   Aortic atherosclerosis [facility] [place])    Discussed with his cardiologist PARSCHOS who agrees that a statin is advised.   Low dose atorvastatin prescribed       Relevant Medications   atorvastatin (LIPITOR) 20 MG tablet   BPH (benign prostatic hyperplasia)    Noted on recent PET scan. He has a history of prostate CA and Received Lupron.  Continue doxasozin and finasteride ,       Mass of upper lobe of right lung    PET scan suggestive of a bronchogenic carcinoma per oncology , but tissue biopsy apparently not an option at this time.  Repeat imaging planned in September       Nonischemic dilated cardiomyopathy ([place])    Secondary to  PAF and EF 30 t o 35 % .   he has no history of CAD and had a reportedly normal cardiac cath in 2010.   Continue  Furosemide ,  carvedilol,  Low salt diet      Relevant Medications   atorvastatin (LIPITOR) 20 MG tablet   Osteoporosis    Prolia was advised for management of Lupron associated osteoporosis that did not improve with alendronate.        Other Visit Diagnoses     Squamous cell skin cancer, earlobe, right    -  Primary   Relevant Orders   Ambulatory referral to Dermatology      I am having Eric Hicks "Clay" start on atorvastatin. I am also having him maintain his calcium carbonate, AMBULATORY NON FORMULARY MEDICATION, Vitamin D3, dorzolamide-timolol, leuprolide (6 Month), latanoprost, ipratropium-albuterol, Wixela Inhub, furosemide, doxazosin, carvedilol, and furosemide.  Meds ordered this encounter  Medications  . atorvastatin (LIPITOR) 20 MG tablet    Sig: Take 1 tablet (20 mg total) by mouth daily.    Dispense:  90 tablet    Refill:  3    There are no discontinued medications.  Follow-up: No follow-ups on file.   Crecencio Mc, MD

## 2019-10-04 NOTE — Progress Notes (Addendum)
Subjective:   Eric Hicks is a 86 y.o. male who presents for Medicare Annual/Subsequent preventive examination.  Review of Systems:  No ROS.  Medicare Wellness Virtual Visit.   Cardiac Risk Factors include: advanced age (>45mn, >>68women);male gender;hypertension     Objective:    Vitals: BP (!) 166/90 (BP Location: Left Arm, Patient Position: Sitting, Cuff Size: Normal)   Pulse (!) 58   Temp (!) 97.4 F (36.3 C) (Temporal)   Ht _0  (1.753 m)   Wt 174 lb 6.4 oz (79.1 kg)   BMI 25.75 kg/m   Body mass index is 25.75 kg/m.  Advanced Directives 10/04/2019 09/28/2019 01/16/2019 10/02/2018 09/28/2017 09/27/2016  Does Patient Have a Medical Advance Directive? _1  Yes  Type of AParamedicof AWoodsideLiving will HMayLiving will HShrews[place]Living will HWalshvilleLiving will HCanon CityLiving will HWater[place]Living will  Does patient want to make changes to medical advance directive? No - Patient declined No - Patient declined - No - Patient declined No - Patient declined No - Patient declined  Copy of HH[place]in Chart? Yes - validated most recent copy scanned in chart (See row information) No - copy requested - Yes - validated most recent copy scanned in chart (See row information) Yes Yes    Tobacco Social History   Tobacco Use  Smoking Status Former Smoker  Smokeless Tobacco Never Used     Counseling given: Not Answered   Clinical Intake:  Pre-visit preparation completed: Yes        Diabetes: No  How often do you need to have someone help you when you read instructions, pamphlets, or other written materials from your doctor or pharmacy?: 1 - Never  Interpreter Needed?: No     Past Medical History:  Diagnosis Date  . BPH (benign prostatic hyperplasia)    managed by DOlena Heckle . COPD (chronic obstructive pulmonary  disease) ([place]   . Elevated PSA, between 10 and less than 20 ng/ml    DOlena Heckle watchful waiting  . Hypertension   . Osteoporosis    by DEXA   Past Surgical History:  Procedure Laterality Date  . TYMPANOSTOMY TUBE PLACEMENT  2017   Family History  Problem Relation Age of Onset  . Diabetes Mother   . Hypertension Father   . Cancer Brother [id number]      multiple myeloma   Social History   Socioeconomic History  . Marital status: Widowed    Spouse name: Not on file  . Number of children: Not on file  . Years of education: Not on file  . Highest education level: Not on file  Occupational History  . Not on file  Tobacco Use  . Smoking status: Former SResearch scientist (life sciences) . Smokeless tobacco: Never Used  Vaping Use  . Vaping Use: Never used  Substance and Sexual Activity  . Alcohol use: No  . Drug use: Not on file  . Sexual activity: Never  Other Topics Concern  . Not on file  Social History Narrative   Regular exercise-yew   Social Determinants of Health   Financial Resource Strain:   . Difficulty of Paying Living Expenses:   Food Insecurity:   . Worried About RCharity fundraiserin the Last Year:   . RArboriculturistin the Last Year:   Transportation Needs:   . LFilm/video editor(Medical):   .Marland Kitchen  Lack of Transportation (Non-Medical):   Physical Activity:   . Days of Exercise per Week:   . Minutes of Exercise per Session:   Stress:   . Feeling of Stress :   Social Connections: Unknown  . Frequency of Communication with Friends and Family: More than three times a week  . Frequency of Social Gatherings with Friends and Family: Not on file  . Attends Religious Services: Not on file  . Active Member of Clubs or Organizations: Not on file  . Attends Archivist Meetings: Not on file  . Marital Status: Widowed    Outpatient Encounter Medications as of 10/04/2019  Medication Sig  . AMBULATORY NON FORMULARY MEDICATION Joint Advantage Gold 2 tablets daily  . calcium  carbonate (OS-CAL) 600 MG TABS Take 600 mg by mouth daily with breakfast.   . carvedilol (COREG) 6.25 MG tablet Take 6.25 mg by mouth 2 (two) times daily.  . Cholecalciferol (VITAMIN D3) 400 units tablet Take 400 Units by mouth daily.   . dorzolamide-timolol (COSOPT) 22.3-6.8 MG/ML ophthalmic solution TAKE 1 DROP(S) IN RIGHT EYE 2 TIMES A DAY  . doxazosin (CARDURA) 1 MG tablet Take 1 mg by mouth daily.  . furosemide (LASIX) 20 MG tablet TAKE 2 TABLETS (40 MG TOTAL) BY MOUTH DAILY AS NEEDED.  . furosemide (LASIX) 20 MG tablet See admin instructions. Take 2 tablets (40 mg ) by mouth one day and 1 tab (20 mg) by mouth every other day as needed  . ipratropium-albuterol (DUONEB) 0.5-2.5 (3) MG/3ML SOLN Take 3 mLs by nebulization every 6 (six) hours as needed.  . latanoprost (XALATAN) 0.005 % ophthalmic solution Place 1 drop into both eyes at bedtime.  Marland Kitchen leuprolide, 6 Month, (ELIGARD) 45 MG injection Inject 45 mg into the skin every 6 (six) months.  Grant Ruts INHUB 250-50 MCG/DOSE AEPB INHALE 1 PUFF INTO THE LUNGS TWICE DAILY   No facility-administered encounter medications on file as of 10/04/2019.    Activities of Daily Living In your present state of health, do you have any difficulty performing the following activities: 10/04/2019  Hearing? Y  Comment Hearing aids  Vision? N  Difficulty concentrating or making decisions? N  Walking or climbing stairs? N  Dressing or bathing? N  Doing errands, shopping? N  Preparing Food and eating ? N  Using the Toilet? N  In the past six months, have you accidently leaked urine? N  Do you have problems with loss of bowel control? N  Managing your Medications? N  Managing your Finances? N  Housekeeping or managing your Housekeeping? N  Some recent data might be hidden    Patient Care Team: Crecencio Mc, MD as PCP - General (Internal Medicine) Telford Nab, RN as Oncology Nurse Navigator   Assessment:   This is a routine wellness examination for  Eric Hicks.  Patient is alert and oriented x3. Patient denies difficulty focusing or concentrating.  Health Maintenance Due: See completed HM at the end of note.   Eye: Visual acuity not assessed. Followed by their ophthalmologist. Wears glasses.   Dental: Visits every 6 months.   Dentures- partial  Hearing: Hearing aids- yes  Safety:  Patient feels safe at home- yes Patient does have smoke detectors at home- yes Patient does wear sunscreen or protective clothing when in direct sunlight - yes Patient does wear seat belt when in a moving vehicle - yes Patient drives- yes Adequate lighting in walkways free from debris- yes Grab bars and handrails used as  appropriate- yes Ambulates with an assistive device- no Cell phone on person when ambulating outside of the home- yes  Social: Alcohol intake - no    Smoking history- former   Smokers in home? none Illicit drug use? none  Medication: Taking as directed and without issues.  Pill box in use -yes  Self managed - yes   Activities of Daily Living Patient denies needing assistance with: household chores, feeding themselves, getting from bed to chair, getting to the toilet, bathing/showering, dressing, managing money, or preparing meals.   Discussed the importance of a healthy diet, water intake and the benefits of aerobic exercise.   Physical activity- active around the home, no routine.   Diet:  Regular Water: good intake Caffeine: 1 soda per day, 1 cup of coffee  Other Providers Patient Care Team: Crecencio Mc, MD as PCP - General (Internal Medicine) Telford Nab, RN as Oncology Nurse Navigator  Exercise Activities and Dietary recommendations Current Exercise Habits: Home exercise routine, Intensity: Mild  Goals      Patient Stated   .  Follow up with Primary Care Provider (pt-stated)      As needed Maintain weight       Fall Risk Fall Risk  10/04/2019 08/30/2019 10/02/2018 09/28/2017 09/27/2016  Falls in the  past year? _0 No No  Number falls in past yr: 0 1 0 - -  Injury with Fall? 0 0 0 - -  Comment He tripped over a curb 2 weeks ago. - - - -  Risk for fall due to : - - (No Data) - -  Risk for fall due to: Comment - - He lost his footing and missed a step when carrying items in both hands. No injury.  - -  Follow up Falls evaluation completed;Falls prevention discussed Falls evaluation completed - - -   Is the patient's home free of loose throw rugs in walkways, pet beds, electrical cords, etc?  Yes      Grab bars in the bathroom? Yes      Handrails on the stairs?  Yes      Adequate lighting?  Yes   Timed Get Up and Go Performed: Performed success.  Depression Screen PHQ 2/9 Scores 10/04/2019 10/02/2018 09/28/2017 09/27/2016  PHQ - 2 Score 0 0 0 0  PHQ- 9 Score - - - 0    Cognitive Function MMSE - Mini Mental State Exam 09/27/2016  Orientation to time 5  Orientation to Place 5  Registration 3  Attention/ Calculation 5  Recall 3  Language- name 2 objects 2  Language- repeat 1  Language- follow 3 step command 3  Language- read & follow direction 1  Write a sentence 1  Copy design 1  Total score 30     6CIT Screen 10/04/2019 10/02/2018 09/28/2017  What Year? 0 points 0 points 0 points  What month? 0 points 0 points 0 points  What time? 0 points 0 points 0 points  Count back from 20 - 0 points 0 points  Months in reverse 0 points 0 points 0 points  Repeat phrase - 0 points 0 points  Total Score - 0 0    Immunization History  Administered Date(s) Administered  . Influenza, High Dose Seasonal PF 02/07/2016, 02/09/2017, 02/17/2018, 02/21/2019  . Influenza-Unspecified 02/26/2011, 02/13/2014, 02/12/2015  . PFIZER SARS-COV-2 Vaccination 05/17/2019, 06/07/2019  . Pneumococcal Conjugate-13 11/20/2013  . Pneumococcal Polysaccharide-23 11/11/1999, 11/11/2006  . Tdap 06/13/2012  . Zoster 02/13/2014   Screening  Tests Health Maintenance  Topic Date Due  . INFLUENZA VACCINE  11/25/2019    . TETANUS/TDAP  06/13/2022  . COVID-19 Vaccine  Completed  . PNA vac Low Risk Adult  Completed   Cancer Screenings: Lung: Low Dose CT Chest recommended if Age 30-80 years, 30 pack-year currently smoking OR have quit w/in 15years. Patient is scheduled 9/7/2.     Plan:   Keep all routine maintenance appointments.   Follow up today @ 11:00, spot on ear  Follow up 03/03/20 @ 9:00  Medicare Attestation I have personally reviewed: The patient's medical and social history Their use of alcohol, tobacco or illicit drugs Their current medications and supplements The patient's functional ability including ADLs,fall risks, home safety risks, cognitive, and hearing and visual impairment Diet and physical activities Evidence for depression   I have reviewed and discussed with patient certain preventive protocols, quality metrics, and best practice recommendations.      OBrien-Blaney, Ahley Bulls L, LPN  08/02/8284   I have reviewed the above information and agree with above.   Deborra Medina, MD

## 2019-10-07 MED ORDER — ATORVASTATIN CALCIUM 20 MG PO TABS: 20.0000 mg | ORAL_TABLET | Freq: Every day | ORAL | 3 refills | Status: DC

## 2019-10-07 NOTE — Assessment & Plan Note (Signed)
PET scan suggestive of a bronchogenic carcinoma per oncology , but tissue biopsy apparently not an option at this time.  Repeat imaging planned in September

## 2019-10-07 NOTE — Assessment & Plan Note (Addendum)
Prolia was advised for management of Lupron associated osteoporosis that did not improve with alendronate.

## 2019-10-07 NOTE — Assessment & Plan Note (Addendum)
Noted on recent PET scan. He has a history of prostate CA and Received Lupron.  Continue doxasozin and finasteride ,

## 2019-10-07 NOTE — Assessment & Plan Note (Addendum)
Secondary to  PAF and EF 30 t o 35 % .   he has no history of CAD and had a reportedly normal cardiac cath in 2010.   Continue  Furosemide ,  carvedilol,  Low salt diet

## 2019-10-07 NOTE — Assessment & Plan Note (Signed)
Discussed with his cardiologist PARSCHOS who agrees that a statin is advised.   Low dose atorvastatin prescribed

## 2019-10-08 ENCOUNTER — Other Ambulatory Visit: Payer: Self-pay | Admitting: Internal Medicine

## 2019-10-11 DIAGNOSIS — I712 Thoracic aortic aneurysm, without rupture: Secondary | ICD-10-CM | POA: Diagnosis not present

## 2019-10-11 DIAGNOSIS — I482 Chronic atrial fibrillation, unspecified: Secondary | ICD-10-CM | POA: Diagnosis not present

## 2019-10-11 DIAGNOSIS — I480 Paroxysmal atrial fibrillation: Secondary | ICD-10-CM | POA: Diagnosis not present

## 2019-10-11 DIAGNOSIS — I420 Dilated cardiomyopathy: Secondary | ICD-10-CM | POA: Diagnosis not present

## 2019-10-11 DIAGNOSIS — I700 Atherosclerosis of aorta: Secondary | ICD-10-CM | POA: Diagnosis not present

## 2019-10-17 MED ORDER — DOXAZOSIN MESYLATE 1 MG PO TABS: 1.0000 mg | ORAL_TABLET | Freq: Every day | ORAL | 1 refills | Status: DC

## 2019-10-17 NOTE — Addendum Note (Signed)
Addended by: Crecencio Mc on: 10/17/2019 01:08 PM   Modules accepted: Orders

## 2019-10-23 DIAGNOSIS — H401112 Primary open-angle glaucoma, right eye, moderate stage: Secondary | ICD-10-CM | POA: Diagnosis not present

## 2019-10-25 ENCOUNTER — Other Ambulatory Visit: Payer: Self-pay | Admitting: Internal Medicine

## 2019-11-26 DIAGNOSIS — D0421 Carcinoma in situ of skin of right ear and external auricular canal: Secondary | ICD-10-CM | POA: Diagnosis not present

## 2019-11-26 DIAGNOSIS — L570 Actinic keratosis: Secondary | ICD-10-CM | POA: Diagnosis not present

## 2019-11-26 DIAGNOSIS — D492 Neoplasm of unspecified behavior of bone, soft tissue, and skin: Secondary | ICD-10-CM | POA: Diagnosis not present

## 2019-12-20 DIAGNOSIS — H6121 Impacted cerumen, right ear: Secondary | ICD-10-CM | POA: Diagnosis not present

## 2019-12-20 DIAGNOSIS — H903 Sensorineural hearing loss, bilateral: Secondary | ICD-10-CM | POA: Diagnosis not present

## 2019-12-20 DIAGNOSIS — H6982 Other specified disorders of Eustachian tube, left ear: Secondary | ICD-10-CM | POA: Diagnosis not present

## 2019-12-29 NOTE — Progress Notes (Signed)
Eric Hicks  [phone] [phone]  ID: Eric Hicks OB: August 04, 1926  MR#: [id number]  RFF#:[id number]  Patient Care Team: Crecencio Mc, MD as PCP - General (Internal Medicine) Telford Nab, RN as Oncology Nurse Navigator  CHIEF COMPLAINT: Right upper lobe lung mass.  INTERVAL HISTORY: Patient returns to clinic today for further evaluation and discussion of his imaging results.  He continues to feel well and remains asymptomatic.  He has no neurologic complaints.  He denies any recent fevers or illnesses.  He has a good appetite and denies weight loss.  He has no chest pain, shortness of breath, cough, or hemoptysis.  He denies any nausea, vomiting, constipation, or diarrhea.  He has no urinary complaints.  Patient offers no specific complaints today.    REVIEW OF SYSTEMS:   Review of Systems  Constitutional: Negative.  Negative for fever, malaise/fatigue and weight loss.  Respiratory: Negative.  Negative for cough, hemoptysis and shortness of breath.   Cardiovascular: Negative.  Negative for chest pain and leg swelling.  Gastrointestinal: Negative.  Negative for abdominal pain.  Genitourinary: Negative.  Negative for dysuria.  Musculoskeletal: Negative.  Negative for back pain.  Skin: Negative.  Negative for rash.  Neurological: Negative.  Negative for dizziness, focal weakness, weakness and headaches.  Psychiatric/Behavioral: Negative.  The patient is not nervous/anxious.     As per HPI. Otherwise, a complete review of systems is negative.  PAST MEDICAL HISTORY: Past Medical History:  Diagnosis Date  . BPH (benign prostatic hyperplasia)    managed by Olena Heckle  . COPD (chronic obstructive pulmonary disease) ([place])   . Elevated PSA, between 10 and less than 20 ng/ml    Olena Heckle, watchful waiting  . Hypertension   . Osteoporosis    by DEXA    PAST SURGICAL HISTORY: Past Surgical History:  Procedure Laterality Date  . TYMPANOSTOMY  TUBE PLACEMENT  2017    FAMILY HISTORY: Family History  Problem Relation Age of Onset  . Diabetes Mother   . Hypertension Father   . Cancer Brother 60       multiple myeloma    ADVANCED DIRECTIVES (Y/N):  N  HEALTH MAINTENANCE: Social History   Tobacco Use  . Smoking status: Former Research scientist (life sciences)  . Smokeless tobacco: Never Used  Vaping Use  . Vaping Use: Never used  Substance Use Topics  . Alcohol use: No  . Drug use: Not on file     Colonoscopy:  PAP:  Bone density:  Lipid panel:  Allergies  Allergen Reactions  . Tylenol [Acetaminophen] Rash    Current Outpatient Medications  Medication Sig Dispense Refill  . AMBULATORY NON FORMULARY MEDICATION Joint Advantage Gold 2 tablets daily    . atorvastatin (LIPITOR) 20 MG tablet Take 1 tablet (20 mg total) by mouth daily. 90 tablet 3  . calcium carbonate (OS-CAL) 600 MG TABS Take 600 mg by mouth daily with breakfast.     . carvedilol (COREG) 6.25 MG tablet Take 6.25 mg by mouth 2 (two) times daily.    . Cholecalciferol (VITAMIN D3) 400 units tablet Take 400 Units by mouth daily.     . dorzolamide-timolol (COSOPT) 22.3-6.8 MG/ML ophthalmic solution TAKE 1 DROP(S) IN RIGHT EYE 2 TIMES A DAY  5  . doxazosin (CARDURA) 1 MG tablet Take 1 tablet (1 mg total) by mouth daily. 90 tablet 1  . furosemide (LASIX) 20 MG tablet See admin instructions. Take 2 tablets (40 mg ) by mouth one day and 1 tab (  20 mg) by mouth every other day as needed    . furosemide (LASIX) 20 MG tablet TAKE 2 TABLETS BY MOUTH DAILY AS NEEDED. 180 tablet 1  . ipratropium-albuterol (DUONEB) 0.5-2.5 (3) MG/3ML SOLN Take 3 mLs by nebulization every 6 (six) hours as needed. 360 mL 11  . latanoprost (XALATAN) 0.005 % ophthalmic solution Place 1 drop into both eyes at bedtime.  5  . leuprolide, 6 Month, (ELIGARD) 45 MG injection Inject 45 mg into the skin every 6 (six) months.    Grant Ruts INHUB 250-50 MCG/DOSE AEPB INHALE 1 PUFF INTO THE LUNGS TWICE DAILY 180 each 3    No current facility-administered medications for this visit.    OBJECTIVE: Vitals:   01/03/20 1052  BP: (!) 168/88  Pulse: (!) 54  Resp: 18  Temp: 97.8 F (36.6 C)     Body mass index is 25.4 kg/m.    ECOG FS:0 - Asymptomatic  General: Well-developed, well-nourished, no acute distress. Eyes: Pink conjunctiva, anicteric sclera. HEENT: Normocephalic, moist mucous membranes. Lungs: No audible wheezing or coughing. Heart: Regular rate and rhythm. Abdomen: Soft, nontender, no obvious distention. Musculoskeletal: No edema, cyanosis, or clubbing. Neuro: Alert, answering all questions appropriately. Cranial nerves grossly intact. Skin: No rashes or petechiae noted. Psych: Normal affect.   LAB RESULTS:  Lab Results  Component Value Date   NA 143 08/30/2019   K 3.8 08/30/2019   CL 107 08/30/2019   CO2 29 08/30/2019   GLUCOSE 89 08/30/2019   BUN 29 (H) 08/30/2019   CREATININE 1.69 (H) 08/30/2019   CALCIUM 9.2 08/30/2019   PROT 6.0 08/30/2019   ALBUMIN 3.8 08/30/2019   AST 16 08/30/2019   ALT 8 08/30/2019   ALKPHOS 57 08/30/2019   BILITOT 0.7 08/30/2019   GFRNONAA 35 (L) 11/12/2011   GFRAA 40 (L) 11/12/2011    Lab Results  Component Value Date   WBC 5.9 08/30/2019   NEUTROABS 3.5 08/30/2019   HGB 13.2 08/30/2019   HCT 39.1 08/30/2019   MCV 95.2 08/30/2019   PLT 187.0 08/30/2019     STUDIES: CT Chest Wo Contrast  Result Date: 01/01/2020 CLINICAL DATA:  Follow-up right lung nodule EXAM: CT CHEST WITHOUT CONTRAST TECHNIQUE: Multidetector CT imaging of the chest was performed following the standard protocol without IV contrast. COMPARISON:  PET-CT dated 09/26/2019.  CT chest dated 09/13/2019. FINDINGS: Cardiovascular: Mild cardiomegaly.  No pericardial effusion. 4.6 cm ascending thoracic aortic aneurysm (series 5/image 63), previously 4.4 cm. Atherosclerotic calcifications of the aortic arch. Three vessel coronary atherosclerosis. Mediastinum/Nodes: No suspicious  mediastinal lymphadenopathy. Right hilar region is poorly evaluated on unenhanced CT, but a 10 mm node is suspected (series 2/image 65). Visualized thyroid is unremarkable. Lungs/Pleura: 12 x 21 mm mixed density spiculated nodule in the central right upper lobe (series 3/image 30), previously 11 x 19 mm when measured in a similar fashion. Associated 15 mm solid component (series 3/image 31), previously 11 mm. This appearance is highly suspicious for invasive adenocarcinoma. Biapical pleural-parenchymal scarring. Minimal subpleural nodularity in the posterior right lower lobe (series 3/image 68). Linear scarring/atelectasis in the lingula. Mild platelike scarring/atelectasis in the left lower lobe. No focal consolidation. Benign calcified granulomata in the right middle and lower lobes. Mild centrilobular and paraseptal emphysematous changes. No pleural effusion or pneumothorax. Upper Abdomen: Visualized upper abdomen is notable for multiple bilateral nonobstructing renal calculi measuring up to 12 mm in the right upper pole, a tiny hiatal hernia, and vascular calcifications. Musculoskeletal: Moderate compression fracture deformity at  L2, incompletely visualized. IMPRESSION: 2.1 cm mixed density nodule in the right upper lobe with progressive solid component, highly suspicious for invasive adenocarcinoma. 10 mm right hilar node, poorly evaluated on unenhanced CT, but raising concern for nodal metastasis. 4.6 cm ascending thoracic aortic aneurysm, previously 4.4 cm. Recommend semi-annual imaging followup by CTA or MRA and referral to cardiothoracic surgery if not already obtained. This recommendation follows 2010 ACCF/AHA/AATS/ACR/ASA/SCA/SCAI/SIR/STS/SVM Guidelines for the Diagnosis and Management of Patients With Thoracic Aortic Disease. Circulation. 2010; 121: R011-Y034. Aortic aneurysm NOS (ICD10-I71.9) Aortic Atherosclerosis (ICD10-I70.0) and Emphysema (ICD10-J43.9). Electronically Signed   By: Julian Hy  M.D.   On: 01/01/2020 12:36    ASSESSMENT: Right upper lobe lung mass.  PLAN:    1. Right upper lobe lung mass: CT scan results from January 01, 2020 reviewed independently and report as above with only mild progression in size of lesion with a more solid component suggesting mild progression of disease.  Previously, case discussed at multidisciplinary tumor board and consensus was this is likely a low-grade malignancy.  Given its slow growth, no intervention is needed at this time.  Patient states he is willing to pursue biopsy and treatment if necessary.  Continue simple observation.  Return to clinic in 3 months with repeat imaging and further evaluation.    I spent a total of 20 minutes reviewing chart data, face-to-face evaluation with the patient, counseling and coordination of care as detailed above.    Patient expressed understanding and was in agreement with this plan. He also understands that He can call clinic at any time with any questions, concerns, or complaints.   Cancer Staging No matching staging information was found for the patient.  Eric Huger, MD   01/04/2020 6:49 AM

## 2019-12-30 ENCOUNTER — Other Ambulatory Visit: Payer: Self-pay | Admitting: Internal Medicine

## 2019-12-30 DIAGNOSIS — I509 Heart failure, unspecified: Secondary | ICD-10-CM

## 2020-01-01 ENCOUNTER — Ambulatory Visit
Admission: RE | Admit: 2020-01-01 | Discharge: 2020-01-01 | Disposition: A | Discharge status: Home / Self Care | Payer: Medicare Other | Source: Ambulatory Visit | Attending: Oncology | Admitting: Oncology

## 2020-01-01 ENCOUNTER — Other Ambulatory Visit: Payer: Self-pay

## 2020-01-01 DIAGNOSIS — I712 Thoracic aortic aneurysm, without rupture: Secondary | ICD-10-CM | POA: Diagnosis not present

## 2020-01-01 DIAGNOSIS — I700 Atherosclerosis of aorta: Secondary | ICD-10-CM | POA: Diagnosis not present

## 2020-01-01 DIAGNOSIS — R918 Other nonspecific abnormal finding of lung field: Secondary | ICD-10-CM | POA: Insufficient documentation

## 2020-01-01 DIAGNOSIS — I2510 Atherosclerotic heart disease of native coronary artery without angina pectoris: Secondary | ICD-10-CM | POA: Diagnosis not present

## 2020-01-01 DIAGNOSIS — J432 Centrilobular emphysema: Secondary | ICD-10-CM | POA: Diagnosis not present

## 2020-01-02 NOTE — Progress Notes (Signed)
Patient was called for pre assessment. He denies pain or concerns. Just states he would like to discuss results of his CT scan.

## 2020-01-03 ENCOUNTER — Inpatient Hospital Stay: Payer: Medicare Other | Attending: Oncology | Admitting: Oncology

## 2020-01-03 ENCOUNTER — Other Ambulatory Visit: Payer: Self-pay

## 2020-01-03 VITALS — BP 168/88 | HR 54 | Temp 97.8°F | Resp 18 | Wt 172.1 lb

## 2020-01-03 DIAGNOSIS — Z79899 Other long term (current) drug therapy: Secondary | ICD-10-CM | POA: Insufficient documentation

## 2020-01-03 DIAGNOSIS — Z87891 Personal history of nicotine dependence: Secondary | ICD-10-CM | POA: Insufficient documentation

## 2020-01-03 DIAGNOSIS — R918 Other nonspecific abnormal finding of lung field: Secondary | ICD-10-CM | POA: Diagnosis not present

## 2020-01-03 DIAGNOSIS — J449 Chronic obstructive pulmonary disease, unspecified: Secondary | ICD-10-CM | POA: Diagnosis not present

## 2020-01-03 DIAGNOSIS — I10 Essential (primary) hypertension: Secondary | ICD-10-CM | POA: Insufficient documentation

## 2020-01-03 NOTE — Progress Notes (Signed)
Pt in for follow up, states CMA called yesterday for chart and medication review.  Pt denies any difficulties today, just wants to discuss scan results.

## 2020-01-21 DIAGNOSIS — L570 Actinic keratosis: Secondary | ICD-10-CM | POA: Diagnosis not present

## 2020-01-21 DIAGNOSIS — L578 Other skin changes due to chronic exposure to nonionizing radiation: Secondary | ICD-10-CM | POA: Diagnosis not present

## 2020-01-21 DIAGNOSIS — D0421 Carcinoma in situ of skin of right ear and external auricular canal: Secondary | ICD-10-CM | POA: Diagnosis not present

## 2020-01-21 DIAGNOSIS — L814 Other melanin hyperpigmentation: Secondary | ICD-10-CM | POA: Diagnosis not present

## 2020-03-03 ENCOUNTER — Ambulatory Visit (INDEPENDENT_AMBULATORY_CARE_PROVIDER_SITE_OTHER): Payer: Medicare Other | Admitting: Internal Medicine

## 2020-03-03 ENCOUNTER — Other Ambulatory Visit: Payer: Self-pay

## 2020-03-03 ENCOUNTER — Encounter: Payer: Self-pay | Admitting: Internal Medicine

## 2020-03-03 VITALS — BP 138/84 | HR 57 | Temp 97.8°F | Resp 14 | Ht 69.0 in | Wt 173.6 lb

## 2020-03-03 DIAGNOSIS — I10 Essential (primary) hypertension: Secondary | ICD-10-CM | POA: Diagnosis not present

## 2020-03-03 DIAGNOSIS — J432 Centrilobular emphysema: Secondary | ICD-10-CM | POA: Diagnosis not present

## 2020-03-03 DIAGNOSIS — I420 Dilated cardiomyopathy: Secondary | ICD-10-CM

## 2020-03-03 DIAGNOSIS — I700 Atherosclerosis of aorta: Secondary | ICD-10-CM

## 2020-03-03 DIAGNOSIS — N182 Chronic kidney disease, stage 2 (mild): Secondary | ICD-10-CM | POA: Diagnosis not present

## 2020-03-03 DIAGNOSIS — I480 Paroxysmal atrial fibrillation: Secondary | ICD-10-CM

## 2020-03-03 DIAGNOSIS — C61 Malignant neoplasm of prostate: Secondary | ICD-10-CM

## 2020-03-03 DIAGNOSIS — R918 Other nonspecific abnormal finding of lung field: Secondary | ICD-10-CM

## 2020-03-03 LAB — COMPREHENSIVE METABOLIC PANEL
ALT: 8 U/L (ref 0–53)
AST: 15 U/L (ref 0–37)
Albumin: 3.8 g/dL (ref 3.5–5.2)
Alkaline Phosphatase: 58 U/L (ref 39–117)
BUN: 30 mg/dL — ABNORMAL HIGH (ref 6–23)
CO2: 30 mEq/L (ref 19–32)
Calcium: 9.2 mg/dL (ref 8.4–10.5)
Chloride: 107 mEq/L (ref 96–112)
Creatinine, Ser: 1.5 mg/dL (ref 0.40–1.50)
GFR: 40.44 mL/min — ABNORMAL LOW (ref >60.00)
Glucose, Bld: 88 mg/dL (ref 70–99)
Potassium: 4.5 mEq/L (ref 3.5–5.1)
Sodium: 143 mEq/L (ref 135–145)
Total Bilirubin: 0.7 mg/dL (ref 0.2–1.2)
Total Protein: 5.7 g/dL — ABNORMAL LOW (ref 6.0–8.3)

## 2020-03-03 LAB — LIPID PANEL
Cholesterol: 125 mg/dL (ref 0–200)
HDL: 70.6 mg/dL (ref >39.00)
LDL Cholesterol: 43 mg/dL (ref 0–99)
NonHDL: 54.79
Total CHOL/HDL Ratio: 2
Triglycerides: 58 mg/dL (ref 0.0–149.0)
VLDL: 11.6 mg/dL (ref 0.0–40.0)

## 2020-03-03 NOTE — Progress Notes (Signed)
Subjective:  Patient ID: Eric Hicks, male    DOB: 1926/06/14  Age: 86 y.o. MRN: [id number]  CC: The primary encounter diagnosis was Primary hypertension. Diagnoses of Nonischemic dilated cardiomyopathy ([place]), Aortic atherosclerosis ([place]), CKD (chronic kidney disease), stage II, Centrilobular emphysema ([place]), Mass of upper lobe of right lung, Paroxysmal atrial fibrillation ([place]), and Prostate cancer ([place]) were also pertinent to this visit.  HPI Corydon Schweiss Diebold presents for follow up on multiple issues  This visit occurred during the SARS-CoV-2 public health emergency.  Safety protocols were in place, including screening questions prior to the visit, additional usage of staff PPE, and extensive cleaning of exam room while observing appropriate contact time as indicated for disinfecting solutions.   Feeling fine.   Lung mass:  Referred to oncology.  Follow up on a likely slow growing malignancy planned  in December  CKD Stage 3.   Avoiding NSAIDs.  Has no pain or need for meds.  Voiding well,  Sleeping well   Emphysema:  Seen on CT scan.  Remains asymptomatic on maintenance inhaler, Wilexa, bid used for ten years.   Prostate CA : no recurrence by annual PSA's done by [facility]  Flu shot received OCt 20   Getting covid booster this week     HTN:  Daily BP readings 100/61 to 133/76 on a calibrated machine.  Has a thoracic aortic aneurysm ; discussed need for tight control fo BP with goal 120/70 or less .  Thoracic aneurysm:  Not amenable to surgery due to age per [facility]   Aortic arch atherosclerosis:  Reviewed findings of prior CT scan today..  Patient  Is no longer taking a statin.  Discussed the role of statin therapy in stabilizing placque and preventing events.    Ear lobe lesion: squamous cell CA  , Had Moh's by [facility] . Sees regular dermatologist every 3 to 6 months.     Remains physically active.  Appetite good, weight has been stable.  Wearing compression stockings  Daily .   Not  using furosemide.    Outpatient Medications Prior to Visit  Medication Sig Dispense Refill  . AMBULATORY NON FORMULARY MEDICATION Joint Advantage Gold 2 tablets daily    . calcium carbonate (OS-CAL) 600 MG TABS Take 600 mg by mouth daily with breakfast.     . carvedilol (COREG) 6.25 MG tablet Take 6.25 mg by mouth 2 (two) times daily.    . dorzolamide-timolol (COSOPT) 22.3-6.8 MG/ML ophthalmic solution TAKE 1 DROP(S) IN RIGHT EYE 2 TIMES A DAY  5  . doxazosin (CARDURA) 1 MG tablet Take 1 tablet (1 mg total) by mouth daily. 90 tablet 1  . furosemide (LASIX) 20 MG tablet TAKE 2 TABLETS BY MOUTH DAILY AS NEEDED. 180 tablet 1  . latanoprost (XALATAN) 0.005 % ophthalmic solution Place 1 drop into both eyes at bedtime.  5  . leuprolide, 6 Month, (ELIGARD) 45 MG injection Inject 45 mg into the skin every 6 (six) months.    . Vitamin D, Cholecalciferol, 25 MCG (1000 UT) CAPS Take 1 capsule by mouth daily.    Grant Ruts INHUB 250-50 MCG/DOSE AEPB INHALE 1 PUFF INTO THE LUNGS TWICE DAILY 180 each 3  . atorvastatin (LIPITOR) 20 MG tablet Take 1 tablet (20 mg total) by mouth daily. (Patient not taking: Reported on 03/03/2020) 90 tablet 3  . Cholecalciferol (VITAMIN D3) 400 units tablet Take 400 Units by mouth daily.  (Patient not taking: Reported on 03/03/2020)    . furosemide (  LASIX) 20 MG tablet See admin instructions. Take 2 tablets (40 mg ) by mouth one day and 1 tab (20 mg) by mouth every other day as needed (Patient not taking: Reported on 03/03/2020)    . ipratropium-albuterol (DUONEB) 0.5-2.5 (3) MG/3ML SOLN Take 3 mLs by nebulization every 6 (six) hours as needed. (Patient not taking: Reported on 03/03/2020) 360 mL 11   No facility-administered medications prior to visit.    Review of Systems;  Patient denies headache, fevers, malaise, unintentional weight loss, skin rash, eye pain, sinus congestion and sinus pain, sore throat, dysphagia,  hemoptysis , cough, dyspnea, wheezing, chest pain,  palpitations, orthopnea, edema, abdominal pain, nausea, melena, diarrhea, constipation, flank pain, dysuria, hematuria, urinary  Frequency, nocturia, numbness, tingling, seizures,  Focal weakness, Loss of consciousness,  Tremor, insomnia, depression, anxiety, and suicidal ideation.      Objective:  BP 138/84 (BP Location: Left Arm, Patient Position: Sitting, Cuff Size: Normal)   Pulse (!) 57   Temp 97.8 F (36.6 C) (Oral)   Resp 14   Ht 5\' 9"  (1.753 m)   Wt 173 lb 9.6 oz (78.7 kg)   SpO2 97%   BMI 25.64 kg/m   BP Readings from Last 3 Encounters:  03/03/20 138/84  01/03/20 (!) 168/88  10/04/19 (!) 166/90    Wt Readings from Last 3 Encounters:  03/03/20 173 lb 9.6 oz (78.7 kg)  01/03/20 172 lb 1.6 oz (78.1 kg)  10/04/19 174 lb 6.4 oz (79.1 kg)    General appearance: alert, cooperative and appears stated age Ears: normal TM's and external ear canals both ears Throat: lips, mucosa, and tongue normal; teeth and gums normal Neck: no adenopathy, no carotid bruit, supple, symmetrical, trachea midline and thyroid not enlarged, symmetric, no tenderness/mass/nodules Back: symmetric, no curvature. ROM normal. No CVA tenderness. Lungs: clear to auscultation bilaterally Heart: regular rate and rhythm, S1, S2 normal, no murmur, click, rub or gallop Abdomen: soft, non-tender; bowel sounds normal; no masses,  no organomegaly Pulses: 2+ and symmetric Skin: Skin color, texture, turgor normal. No rashes or lesions Lymph nodes: Cervical, supraclavicular, and axillary nodes normal.  No results found for: HGBA1C  Lab Results  Component Value Date   CREATININE 1.50 03/03/2020   CREATININE 1.69 (H) 08/30/2019   CREATININE 1.50 (H) 10/02/2018    Lab Results  Component Value Date   WBC 5.9 08/30/2019   HGB 13.2 08/30/2019   HCT 39.1 08/30/2019   PLT 187.0 08/30/2019   GLUCOSE 88 03/03/2020   CHOL 125 03/03/2020   TRIG 58.0 03/03/2020   HDL 70.60 03/03/2020   LDLCALC 43 03/03/2020    ALT 8 03/03/2020   AST 15 03/03/2020   NA 143 03/03/2020   K 4.5 03/03/2020   CL 107 03/03/2020   CREATININE 1.50 03/03/2020   BUN 30 (H) 03/03/2020   CO2 30 03/03/2020   TSH 1.69 06/14/2018   PSA 13.67 (H) 05/15/2014   MICROALBUR <0.7 06/20/2014    CT Chest Wo Contrast  Result Date: 01/01/2020 CLINICAL DATA:  Follow-up right lung nodule EXAM: CT CHEST WITHOUT CONTRAST TECHNIQUE: Multidetector CT imaging of the chest was performed following the standard protocol without IV contrast. COMPARISON:  PET-CT dated 09/26/2019.  CT chest dated 09/13/2019. FINDINGS: Cardiovascular: Mild cardiomegaly.  No pericardial effusion. 4.6 cm ascending thoracic aortic aneurysm (series 5/image 63), previously 4.4 cm. Atherosclerotic calcifications of the aortic arch. Three vessel coronary atherosclerosis. Mediastinum/Nodes: No suspicious mediastinal lymphadenopathy. Right hilar region is poorly evaluated on unenhanced CT, but  a 10 mm node is suspected (series 2/image 65). Visualized thyroid is unremarkable. Lungs/Pleura: 12 x 21 mm mixed density spiculated nodule in the central right upper lobe (series 3/image 30), previously 11 x 19 mm when measured in a similar fashion. Associated 15 mm solid component (series 3/image 31), previously 11 mm. This appearance is highly suspicious for invasive adenocarcinoma. Biapical pleural-parenchymal scarring. Minimal subpleural nodularity in the posterior right lower lobe (series 3/image 68). Linear scarring/atelectasis in the lingula. Mild platelike scarring/atelectasis in the left lower lobe. No focal consolidation. Benign calcified granulomata in the right middle and lower lobes. Mild centrilobular and paraseptal emphysematous changes. No pleural effusion or pneumothorax. Upper Abdomen: Visualized upper abdomen is notable for multiple bilateral nonobstructing renal calculi measuring up to 12 mm in the right upper pole, a tiny hiatal hernia, and vascular calcifications.  Musculoskeletal: Moderate compression fracture deformity at L2, incompletely visualized. IMPRESSION: 2.1 cm mixed density nodule in the right upper lobe with progressive solid component, highly suspicious for invasive adenocarcinoma. 10 mm right hilar node, poorly evaluated on unenhanced CT, but raising concern for nodal metastasis. 4.6 cm ascending thoracic aortic aneurysm, previously 4.4 cm. Recommend semi-annual imaging followup by CTA or MRA and referral to cardiothoracic surgery if not already obtained. This recommendation follows 2010 ACCF/AHA/AATS/ACR/ASA/SCA/SCAI/SIR/STS/SVM Guidelines for the Diagnosis and Management of Patients With Thoracic Aortic Disease. Circulation. 2010; 121: V371-G626. Aortic aneurysm NOS (ICD10-I71.9) Aortic Atherosclerosis (ICD10-I70.0) and Emphysema (ICD10-J43.9). Electronically Signed   By: Julian Hy M.D.   On: 01/01/2020 12:36    Assessment & Plan:   Problem List Items Addressed This Visit      Unprioritized   Aortic atherosclerosis ([place])    Reviewed the findings as an indication for resuming statins.  He remains ambivalent and prefers to discuss with Aris Georgia , his cardiologist before resuming atorvastatin      Relevant Orders   Lipid panel (Completed)   CKD (chronic kidney disease), stage II    Stable GFR.Marland Kitchen  continue furosemide  20 mg alternating with 40 mg   Lab Results  Component Value Date   CREATININE 1.50 03/03/2020         COPD (chronic obstructive pulmonary disease) with emphysema ([facility])    he has been relatively asymptomatic with scheduled use of Wixela .       Hypertension - Primary    Well controlled on current regimen Goals is 120/70 given presence of  thoracic aneurysm  Renal function stable, no changes today.      Relevant Orders   Comprehensive metabolic panel (Completed)   Mass of upper lobe of right lung    Presumed to be a slow growing malignancy per Tumor Board discussion.  3 month follow up with Oncology in  December       Nonischemic dilated cardiomyopathy [facility])    Daily weights have been stable.  Has not used furosemide more than twice per month.  Watching salt intake.       Paroxysmal atrial fibrillation ([facility])    Chronic,  Continue carvedilol.  anticoagulation deferred by patient .       Prostate cancer ([place])    No intervention. Urology  Following PSAs ,  Lab Results  Component Value Date   PSA 13.67 (H) 05/15/2014   PSA 17.60 (H) 11/10/2012   PSA 17.13 (H) 11/12/2011            I provided  30 minutes of  face-to-face time during this encounter reviewing patient's current problems and past surgeries,  labs and imaging studies, providing counseling on the above mentioned problems , and coordination  of care .   I have discontinued Nemesio Castrillon. [place] "Clay"'s Vitamin D3, ipratropium-albuterol, and atorvastatin. I am also having him maintain his calcium carbonate, AMBULATORY NON FORMULARY MEDICATION, dorzolamide-timolol, leuprolide (6 Month), latanoprost, carvedilol, doxazosin, Wixela Inhub, furosemide, and Vitamin D (Cholecalciferol).  No orders of the defined types were placed in this encounter.   Medications Discontinued During This Encounter  Medication Reason  . atorvastatin (LIPITOR) 20 MG tablet   . Cholecalciferol (VITAMIN D3) 400 units tablet   . furosemide (LASIX) 20 MG tablet Duplicate  . ipratropium-albuterol (DUONEB) 0.5-2.5 (3) MG/3ML SOLN     Follow-up: Return in about 6 months (around 08/31/2020).   Crecencio Mc, MD

## 2020-03-03 NOTE — Patient Instructions (Signed)
You are doing very well!   Your aortic arch atherosclerosis places you at increased risk for heart attack and stroke  I recommend that you resume taking Atorvastatin to reduce this risk.  Please talk to dr Saralyn Pilar about this !

## 2020-03-03 NOTE — Assessment & Plan Note (Signed)
Daily weights have been stable.  Has not used furosemide more than twice per month.  Watching salt intake.

## 2020-03-03 NOTE — Assessment & Plan Note (Signed)
Reviewed the findings as an indication for resuming statins.  He remains ambivalent and prefers to discuss with Aris Georgia , his cardiologist before resuming atorvastatin

## 2020-03-04 ENCOUNTER — Encounter: Payer: Self-pay | Admitting: Internal Medicine

## 2020-03-04 NOTE — Assessment & Plan Note (Signed)
Presumed to be a slow growing malignancy per Tumor Board discussion.  3 month follow up with Oncology in December

## 2020-03-04 NOTE — Assessment & Plan Note (Signed)
Chronic,  Continue carvedilol.  anticoagulation deferred by patient .

## 2020-03-04 NOTE — Assessment & Plan Note (Signed)
Stable GFR.Marland Kitchen  continue furosemide  20 mg alternating with 40 mg   Lab Results  Component Value Date   CREATININE 1.50 03/03/2020

## 2020-03-04 NOTE — Assessment & Plan Note (Signed)
Well controlled on current regimen Goals is 120/70 given presence of  thoracic aneurysm  Renal function stable, no changes today.

## 2020-03-04 NOTE — Progress Notes (Signed)
Cholesterol is fine,  kidney function is stable.  I still recommend resuming Lipitor even if once a week  for placque stabilization  He will discuss with paraschos

## 2020-03-04 NOTE — Assessment & Plan Note (Addendum)
No intervention. Urology  Following PSAs ,  Lab Results  Component Value Date   PSA 13.67 (H) 05/15/2014   PSA 17.60 (H) 11/10/2012   PSA 17.13 (H) 11/12/2011

## 2020-03-04 NOTE — Assessment & Plan Note (Signed)
he has been relatively asymptomatic with scheduled use of Wixela .

## 2020-03-10 DIAGNOSIS — L578 Other skin changes due to chronic exposure to nonionizing radiation: Secondary | ICD-10-CM | POA: Diagnosis not present

## 2020-03-10 DIAGNOSIS — R21 Rash and other nonspecific skin eruption: Secondary | ICD-10-CM | POA: Diagnosis not present

## 2020-03-10 DIAGNOSIS — L570 Actinic keratosis: Secondary | ICD-10-CM | POA: Diagnosis not present

## 2020-03-10 DIAGNOSIS — L820 Inflamed seborrheic keratosis: Secondary | ICD-10-CM | POA: Diagnosis not present

## 2020-03-10 DIAGNOSIS — D492 Neoplasm of unspecified behavior of bone, soft tissue, and skin: Secondary | ICD-10-CM | POA: Diagnosis not present

## 2020-03-10 DIAGNOSIS — B079 Viral wart, unspecified: Secondary | ICD-10-CM | POA: Diagnosis not present

## 2020-03-31 ENCOUNTER — Ambulatory Visit
Admission: RE | Admit: 2020-03-31 | Discharge: 2020-03-31 | Disposition: A | Discharge status: Home / Self Care | Payer: Medicare Other | Source: Ambulatory Visit | Attending: Oncology | Admitting: Oncology

## 2020-03-31 ENCOUNTER — Other Ambulatory Visit: Payer: Self-pay

## 2020-03-31 DIAGNOSIS — C3411 Malignant neoplasm of upper lobe, right bronchus or lung: Secondary | ICD-10-CM | POA: Diagnosis not present

## 2020-03-31 DIAGNOSIS — R911 Solitary pulmonary nodule: Secondary | ICD-10-CM | POA: Diagnosis not present

## 2020-03-31 DIAGNOSIS — R918 Other nonspecific abnormal finding of lung field: Secondary | ICD-10-CM | POA: Insufficient documentation

## 2020-04-07 ENCOUNTER — Encounter: Payer: Self-pay | Admitting: Oncology

## 2020-04-07 ENCOUNTER — Inpatient Hospital Stay: Payer: Medicare Other | Attending: Oncology | Admitting: Oncology

## 2020-04-07 ENCOUNTER — Ambulatory Visit: Payer: Medicare Other | Admitting: Oncology

## 2020-04-07 ENCOUNTER — Other Ambulatory Visit: Payer: Self-pay

## 2020-04-07 VITALS — BP 132/88 | HR 69 | Temp 98.4°F | Resp 20 | Wt 177.2 lb

## 2020-04-07 DIAGNOSIS — Z79899 Other long term (current) drug therapy: Secondary | ICD-10-CM | POA: Insufficient documentation

## 2020-04-07 DIAGNOSIS — Z87891 Personal history of nicotine dependence: Secondary | ICD-10-CM | POA: Insufficient documentation

## 2020-04-07 DIAGNOSIS — I480 Paroxysmal atrial fibrillation: Secondary | ICD-10-CM | POA: Diagnosis not present

## 2020-04-07 DIAGNOSIS — I700 Atherosclerosis of aorta: Secondary | ICD-10-CM | POA: Diagnosis not present

## 2020-04-07 DIAGNOSIS — I482 Chronic atrial fibrillation, unspecified: Secondary | ICD-10-CM | POA: Diagnosis not present

## 2020-04-07 DIAGNOSIS — I10 Essential (primary) hypertension: Secondary | ICD-10-CM | POA: Diagnosis not present

## 2020-04-07 DIAGNOSIS — R918 Other nonspecific abnormal finding of lung field: Secondary | ICD-10-CM | POA: Insufficient documentation

## 2020-04-07 DIAGNOSIS — J449 Chronic obstructive pulmonary disease, unspecified: Secondary | ICD-10-CM | POA: Insufficient documentation

## 2020-04-07 DIAGNOSIS — I420 Dilated cardiomyopathy: Secondary | ICD-10-CM | POA: Diagnosis not present

## 2020-04-07 NOTE — Progress Notes (Signed)
Patient denies any concerns today.  

## 2020-04-08 ENCOUNTER — Other Ambulatory Visit: Payer: Self-pay | Admitting: Internal Medicine

## 2020-04-10 NOTE — Progress Notes (Signed)
[place]  [phone] [phone]  ID: Eric Hicks OB: 26-Dec-1926  MR#: [id number]  SNK#:[id number]  Patient Care Team: Crecencio Mc, MD as PCP - General (Internal Medicine) Telford Nab, RN as Oncology Nurse Navigator  CHIEF COMPLAINT: Right upper lobe lung mass.  INTERVAL HISTORY: Patient returns to clinic today for further evaluation and discussion of his imaging results.He was last seen on 01/03/20 to discuss Ct chest results. Showed stable 2.1 cm nodule in RUL and enlarged hilar node.   In the interim, he has done well. He continues to feel well and remains asymptomatic.  He has no neurologic complaints.  He denies any recent fevers or illnesses.  He has a good appetite and denies weight loss.  He has no chest pain, shortness of breath, cough, or hemoptysis.  He denies any nausea, vomiting, constipation, or diarrhea.  He has no urinary complaints.  Patient offers no specific complaints today.    REVIEW OF SYSTEMS:   Review of Systems  Constitutional: Negative.  Negative for fever, malaise/fatigue and weight loss.  Respiratory: Negative.  Negative for cough, hemoptysis and shortness of breath.   Cardiovascular: Negative.  Negative for chest pain and leg swelling.  Gastrointestinal: Negative.  Negative for abdominal pain.  Genitourinary: Negative.  Negative for dysuria.  Musculoskeletal: Negative.  Negative for back pain.  Skin: Negative.  Negative for rash.  Neurological: Negative.  Negative for dizziness, focal weakness, weakness and headaches.  Psychiatric/Behavioral: Negative.  The patient is not nervous/anxious.     As per HPI. Otherwise, a complete review of systems is negative.  PAST MEDICAL HISTORY: Past Medical History:  Diagnosis Date  . BPH (benign prostatic hyperplasia)    managed by Olena Heckle  . COPD (chronic obstructive pulmonary disease) ([place])   . Elevated PSA, between 10 and less than 20 ng/ml    Olena Heckle, watchful  waiting  . Hypertension   . Osteoporosis    by DEXA    PAST SURGICAL HISTORY: Past Surgical History:  Procedure Laterality Date  . TYMPANOSTOMY TUBE PLACEMENT  2017    FAMILY HISTORY: Family History  Problem Relation Age of Onset  . Diabetes Mother   . Hypertension Father   . Cancer Brother 60       multiple myeloma    ADVANCED DIRECTIVES (Y/N):  N  HEALTH MAINTENANCE: Social History   Tobacco Use  . Smoking status: Former Research scientist (life sciences)  . Smokeless tobacco: Never Used  Vaping Use  . Vaping Use: Never used  Substance Use Topics  . Alcohol use: No     Colonoscopy:  PAP:  Bone density:  Lipid panel:  Allergies  Allergen Reactions  . Tylenol [Acetaminophen] Rash    Current Outpatient Medications  Medication Sig Dispense Refill  . AMBULATORY NON FORMULARY MEDICATION Joint Advantage Gold 2 tablets daily    . calcium carbonate (OS-CAL) 600 MG TABS Take 600 mg by mouth daily with breakfast.     . carvedilol (COREG) 6.25 MG tablet Take 6.25 mg by mouth 2 (two) times daily.    . dorzolamide-timolol (COSOPT) 22.3-6.8 MG/ML ophthalmic solution TAKE 1 DROP(S) IN RIGHT EYE 2 TIMES A DAY  5  . furosemide (LASIX) 20 MG tablet TAKE 2 TABLETS BY MOUTH DAILY AS NEEDED. 180 tablet 1  . latanoprost (XALATAN) 0.005 % ophthalmic solution Place 1 drop into both eyes at bedtime.  5  . leuprolide, 6 Month, (ELIGARD) 45 MG injection Inject 45 mg into the skin every 6 (six) months.    Marland Kitchen  Vitamin D, Cholecalciferol, 25 MCG (1000 UT) CAPS Take 1 capsule by mouth daily.    Grant Ruts INHUB 250-50 MCG/DOSE AEPB INHALE 1 PUFF INTO THE LUNGS TWICE DAILY 180 each 3  . doxazosin (CARDURA) 1 MG tablet TAKE 1 TABLET BY MOUTH EVERY DAY 90 tablet 1   No current facility-administered medications for this visit.    OBJECTIVE: Vitals:   04/07/20 1408  BP: 132/88  Pulse: 69  Resp: 20  Temp: 98.4 F (36.9 C)     Body mass index is 26.17 kg/m.    ECOG FS:0 - Asymptomatic  Physical  Exam Constitutional:      General: Vital signs are normal.     Appearance: Normal appearance.  HENT:     Head: Normocephalic and atraumatic.  Eyes:     Pupils: Pupils are equal, round, and reactive to light.  Cardiovascular:     Rate and Rhythm: Normal rate and regular rhythm.     Heart sounds: Normal heart sounds. No murmur heard.   Pulmonary:     Effort: Pulmonary effort is normal.     Breath sounds: Normal breath sounds. No wheezing.  Abdominal:     General: Bowel sounds are normal. There is no distension.     Palpations: Abdomen is soft.     Tenderness: There is no abdominal tenderness.  Musculoskeletal:        General: No edema. Normal range of motion.     Cervical back: Normal range of motion.  Skin:    General: Skin is warm and dry.     Findings: No rash.  Neurological:     Mental Status: He is alert and oriented to person, place, and time.  Psychiatric:        Judgment: Judgment normal.      LAB RESULTS:  Lab Results  Component Value Date   NA 143 03/03/2020   K 4.5 03/03/2020   CL 107 03/03/2020   CO2 30 03/03/2020   GLUCOSE 88 03/03/2020   BUN 30 (H) 03/03/2020   CREATININE 1.50 03/03/2020   CALCIUM 9.2 03/03/2020   PROT 5.7 (L) 03/03/2020   ALBUMIN 3.8 03/03/2020   AST 15 03/03/2020   ALT 8 03/03/2020   ALKPHOS 58 03/03/2020   BILITOT 0.7 03/03/2020   GFRNONAA 35 (L) 11/12/2011   GFRAA 40 (L) 11/12/2011    Lab Results  Component Value Date   WBC 5.9 08/30/2019   NEUTROABS 3.5 08/30/2019   HGB 13.2 08/30/2019   HCT 39.1 08/30/2019   MCV 95.2 08/30/2019   PLT 187.0 08/30/2019     STUDIES: CT Chest Wo Contrast  Result Date: 04/01/2020 CLINICAL DATA:  Low-grade malignancy in the right lung, under surveillance EXAM: CT CHEST WITHOUT CONTRAST TECHNIQUE: Multidetector CT imaging of the chest was performed following the standard protocol without IV contrast. COMPARISON:  01/01/2020 FINDINGS: Cardiovascular: Coronary, aortic arch, and branch  vessel atherosclerotic vascular disease. Mild cardiomegaly. Ascending aortic aneurysm 4.8 cm in diameter on image 77 series 2, previously the same. Mediastinum/Nodes: 1.0 cm right hilar lymph node, borderline enlarged, stable. Lungs/Pleura: 2.2 by 1.1 cm irregular peribronchovascular nodule in the right upper lobe on image 38 of series 3 appears stable. Biapical pleuroparenchymal scarring. Airway thickening is present, suggesting bronchitis or reactive airways disease. Old granulomatous disease including a calcified granuloma in the left upper lobe on image 79 of series 3. Scarring with volume loss posteriorly in the left upper lobe, unchanged. Mild atelectasis along the left hemidiaphragm. Upper Abdomen: Bilateral  nephrolithiasis. Abdominal aortic atherosclerosis. Musculoskeletal: No acute findings. Bifida right distal fourth rib. Old healed lower sternal body fracture. Cervical and thoracic spondylosis. Suspected superior endplate compression at L2 only partially included on today's exam but not changed from prior. Dextroconvex thoracic scoliosis. IMPRESSION: 1. Stable 2.2 by 1.1 cm irregular peribronchovascular nodule in the right upper lobe, suspicious for low-grade malignancy. 2. Stable borderline enlarged right hilar lymph node. 3. Coronary, aortic arch, and branch vessel atherosclerotic vascular disease. Mild cardiomegaly. 4. Airway thickening is present, suggesting bronchitis or reactive airways disease. 5. Bilateral nephrolithiasis. 6. Ascending thoracic aortic aneurysm stable at 4.8 cm in diameter. Ascending thoracic aortic aneurysm. Recommend semi-annual imaging followup by CTA or MRA and referral to cardiothoracic surgery if not already obtained. This recommendation follows 2010 ACCF/AHA/AATS/ACR/ASA/SCA/SCAI/SIR/STS/SVM Guidelines for the Diagnosis and Management of Patients With Thoracic Aortic Disease. Circulation. 2010; 121: V494-W967. Aortic aneurysm NOS (ICD10-I71.9) 7. Suspected superior endplate  compression at L2, only partially included on today's exam but not changed from prior. 8. Aortic atherosclerosis. Aortic Atherosclerosis (ICD10-I70.0). Electronically Signed   By: Van Clines M.D.   On: 04/01/2020 08:38    ASSESSMENT: Right upper lobe lung mass.  Oncology History: CT scan results from January 01, 2020 reviewed independently and report as above with only mild progression in size of lesion with a more solid component suggesting mild progression of disease.  Previously, case discussed at multidisciplinary tumor board and consensus was this is likely a low-grade malignancy.   PLAN:    1. Right upper lobe lung mass:  - surveillance - Routine 3 month Ct scans -Ct chest from 03/31/20 stable 2.2 cm nodule in RUL.  - Agreeable for biopsy if/ when needed - RTC in 3 months for surveillance Ct scan   Greater than 50% was spent in counseling and coordination of care with this patient including but not limited to discussion of the relevant topics above (See A&P) including, but not limited to diagnosis and management of acute and chronic medical conditions.     Patient expressed understanding and was in agreement with this plan. He also understands that He can call clinic at any time with any questions, concerns, or complaints.   Cancer Staging No matching staging information was found for the patient.  Jacquelin Hawking, NP   04/10/2020 5:08 AM

## 2020-04-16 ENCOUNTER — Telehealth: Payer: Self-pay

## 2020-04-16 NOTE — Telephone Encounter (Signed)
Spoken to patient, He is having coughing spells with little congestion, chest pain due to coughing. Patient is using a nebulizer for sx, seems to help but sx keep coming back. Patient has COPD and this flares up every year when it gets cold. Patient would like something to help with the coughing when it occurs. NO fever, chills, nasal congestion, SOB, heart palpitations, and headache. Please advise

## 2020-04-16 NOTE — Telephone Encounter (Signed)
With cough, chest pain, sob - needs to be evaluated.  Just saw pulmonary 04/07/20 and had no symptoms.  Given symptoms - needs to be seen and evaluated.

## 2020-04-16 NOTE — Telephone Encounter (Signed)
Patient was instructed to go to UC. He stated he may gop to [facility].

## 2020-04-16 NOTE — Telephone Encounter (Signed)
Pt called stating that he is having SOB and a croupy cough. Transferred to [facility] to triage

## 2020-04-22 DIAGNOSIS — H401121 Primary open-angle glaucoma, left eye, mild stage: Secondary | ICD-10-CM | POA: Diagnosis not present

## 2020-04-22 MED ORDER — PREDNISONE 10 MG PO TABS: ORAL_TABLET | ORAL | 0 refills | Status: DC

## 2020-04-22 MED ORDER — AZITHROMYCIN 250 MG PO TABS: ORAL_TABLET | ORAL | 0 refills | Status: DC

## 2020-04-22 NOTE — Telephone Encounter (Signed)
Patient has been informed.

## 2020-04-22 NOTE — Telephone Encounter (Signed)
Patient stated he is having some more COPD Flare up. He went to [facility] ED in [facility], he left without being seen last week.  Symptoms have returned. He is becoming SOB again and is in need of some medication. He has tried his nebulizer and inhaler but has not worked. He refused to go to ED/UC. He would like to have Dr Derrel Nip appointment or something called in. Please advise.

## 2020-04-22 NOTE — Telephone Encounter (Signed)
PREDNISONE AND Z PACK SENT TO [facility] IN [place],  BUT NEEDS TO GET COVID TESTED ASAP .  IF SYMPTOMS DO NOT IMPROVE BY Thursday HE WILL NEED TO GO TO AN URGENT CARE OR ER

## 2020-04-22 NOTE — Addendum Note (Signed)
Addended by: Crecencio Mc on: 04/22/2020 04:38 PM   Modules accepted: Orders

## 2020-04-22 NOTE — Telephone Encounter (Signed)
Pt called back he is having a COPD flare up again

## 2020-04-23 DIAGNOSIS — Z1152 Encounter for screening for COVID-19: Secondary | ICD-10-CM | POA: Diagnosis not present

## 2020-04-23 DIAGNOSIS — J069 Acute upper respiratory infection, unspecified: Secondary | ICD-10-CM | POA: Diagnosis not present

## 2020-04-23 DIAGNOSIS — R059 Cough, unspecified: Secondary | ICD-10-CM | POA: Diagnosis not present

## 2020-05-19 DIAGNOSIS — H401112 Primary open-angle glaucoma, right eye, moderate stage: Secondary | ICD-10-CM | POA: Diagnosis not present

## 2020-06-19 DIAGNOSIS — H6123 Impacted cerumen, bilateral: Secondary | ICD-10-CM | POA: Diagnosis not present

## 2020-06-19 DIAGNOSIS — H6983 Other specified disorders of Eustachian tube, bilateral: Secondary | ICD-10-CM | POA: Diagnosis not present

## 2020-06-27 ENCOUNTER — Other Ambulatory Visit: Payer: Self-pay | Admitting: Internal Medicine

## 2020-06-27 DIAGNOSIS — I509 Heart failure, unspecified: Secondary | ICD-10-CM

## 2020-07-05 NOTE — Progress Notes (Signed)
[facility]  [phone] [phone]  ID: Hughes Better OB: [birth year]  MR#: [id number]  ZDG#:[id number]  Patient Care Team: Sherlene Shams, MD as PCP - General (Internal Medicine) Glory Buff, RN as Oncology Nurse Navigator  CHIEF COMPLAINT: Right upper lobe lung mass.  INTERVAL HISTORY: Patient returns to clinic today for further evaluation and discussion of his imaging results.  He continues to feel well and remains asymptomatic.  He continues to remain active. He has no neurologic complaints.  He denies any recent fevers or illnesses.  He has a good appetite and denies weight loss.  He has no chest pain, shortness of breath, cough, or hemoptysis.  He denies any nausea, vomiting, constipation, or diarrhea.  He has no urinary complaints.  Patient offers no specific complaints today.  REVIEW OF SYSTEMS:   Review of Systems  Constitutional: Negative.  Negative for fever, malaise/fatigue and weight loss.  Respiratory: Negative.  Negative for cough, hemoptysis and shortness of breath.   Cardiovascular: Negative.  Negative for chest pain and leg swelling.  Gastrointestinal: Negative.  Negative for abdominal pain.  Genitourinary: Negative.  Negative for dysuria.  Musculoskeletal: Negative.  Negative for back pain.  Skin: Negative.  Negative for rash.  Neurological: Negative.  Negative for dizziness, focal weakness, weakness and headaches.  Psychiatric/Behavioral: Negative.  The patient is not nervous/anxious.     As per HPI. Otherwise, a complete review of systems is negative.  PAST MEDICAL HISTORY: Past Medical History:  Diagnosis Date  . BPH (benign prostatic hyperplasia)    managed by Garner Nash  . COPD (chronic obstructive pulmonary disease) ([facility])   . Elevated PSA, between 10 and less than 20 ng/ml    Garner Nash, watchful waiting  . Hypertension   . Osteoporosis    by DEXA    PAST SURGICAL HISTORY: Past Surgical History:  Procedure  Laterality Date  . TYMPANOSTOMY TUBE PLACEMENT  2017    FAMILY HISTORY: Family History  Problem Relation Age of Onset  . Diabetes Mother   . Hypertension Father   . Cancer Brother 67       multiple myeloma    ADVANCED DIRECTIVES (Y/N):  N  HEALTH MAINTENANCE: Social History   Tobacco Use  . Smoking status: Former Games developer  . Smokeless tobacco: Never Used  Vaping Use  . Vaping Use: Never used  Substance Use Topics  . Alcohol use: No     Colonoscopy:  PAP:  Bone density:  Lipid panel:  Allergies  Allergen Reactions  . Tylenol [Acetaminophen] Rash    Current Outpatient Medications  Medication Sig Dispense Refill  . AMBULATORY NON FORMULARY MEDICATION Joint Advantage Gold 2 tablets daily    . calcium carbonate (OS-CAL) 600 MG TABS Take 600 mg by mouth daily with breakfast.     . carvedilol (COREG) 6.25 MG tablet Take 6.25 mg by mouth 2 (two) times daily.    . dorzolamide-timolol (COSOPT) 22.3-6.8 MG/ML ophthalmic solution TAKE 1 DROP(S) IN RIGHT EYE 2 TIMES A DAY  5  . doxazosin (CARDURA) 1 MG tablet TAKE 1 TABLET BY MOUTH EVERY DAY 90 tablet 1  . latanoprost (XALATAN) 0.005 % ophthalmic solution Place 1 drop into both eyes at bedtime.  5  . leuprolide, 6 Month, (ELIGARD) 45 MG injection Inject 45 mg into the skin every 6 (six) months.    . Vitamin D, Cholecalciferol, 25 MCG (1000 UT) CAPS Take 1 capsule by mouth daily.    Monte Fantasia INHUB 250-50 MCG/DOSE AEPB INHALE  1 PUFF INTO THE LUNGS TWICE DAILY 180 each 3  . furosemide (LASIX) 20 MG tablet TAKE 2 TABLETS BY MOUTH DAILY AS NEEDED. (Patient not taking: Reported on 07/09/2020) 180 tablet 1   No current facility-administered medications for this visit.    OBJECTIVE: Vitals:   07/09/20 1104  BP: (!) 130/93  Pulse: 69  Resp: (!) 22  Temp: (!) 97.4 F (36.3 C)  SpO2: 100%     Body mass index is 26.98 kg/m.    ECOG FS:0 - Asymptomatic  General: Well-developed, well-nourished, no acute distress. Eyes: Pink  conjunctiva, anicteric sclera. HEENT: Normocephalic, moist mucous membranes. Lungs: No audible wheezing or coughing. Heart: Regular rate and rhythm. Abdomen: Soft, nontender, no obvious distention. Musculoskeletal: No edema, cyanosis, or clubbing. Neuro: Alert, answering all questions appropriately. Cranial nerves grossly intact. Skin: No rashes or petechiae noted. Psych: Normal affect.  LAB RESULTS:  Lab Results  Component Value Date   NA 143 03/03/2020   K 4.5 03/03/2020   CL 107 03/03/2020   CO2 30 03/03/2020   GLUCOSE 88 03/03/2020   BUN 30 (H) 03/03/2020   CREATININE 1.50 03/03/2020   CALCIUM 9.2 03/03/2020   PROT 5.7 (L) 03/03/2020   ALBUMIN 3.8 03/03/2020   AST 15 03/03/2020   ALT 8 03/03/2020   ALKPHOS 58 03/03/2020   BILITOT 0.7 03/03/2020   GFRNONAA 35 (L) 11/12/2011   GFRAA 40 (L) 11/12/2011    Lab Results  Component Value Date   WBC 5.9 08/30/2019   NEUTROABS 3.5 08/30/2019   HGB 13.2 08/30/2019   HCT 39.1 08/30/2019   MCV 95.2 08/30/2019   PLT 187.0 08/30/2019     STUDIES: CT Chest Wo Contrast  Result Date: 07/07/2020 CLINICAL DATA:  Non-small cell lung cancer. Shortness of breath with exertion for 1 year. EXAM: CT CHEST WITHOUT CONTRAST TECHNIQUE: Multidetector CT imaging of the chest was performed following the standard protocol without IV contrast. COMPARISON:  03/31/2020. FINDINGS: Cardiovascular: Atherosclerotic calcification of the aorta, aortic valve and coronary arteries. Ascending aorta measures 4.8 cm. Pulmonic trunk and heart are enlarged. No pericardial effusion. Mediastinum/Nodes: No pathologically enlarged mediastinal or axillary lymph nodes. Hilar regions are difficult to evaluate without IV contrast. Calcified subcarinal lymph node. Mid periesophageal lymph nodes measure up to approximately 10 mm, unchanged. Esophagus is grossly unremarkable. Lungs/Pleura: Image quality is degraded by respiratory motion. Spiculated nodule in the apical  segment right upper lobe is stable in size, measuring 1.6 x 2.4 cm, with slight differences in measurement technique are considered. Mild peribronchovascular ground-glass and basilar septal thickening. 5 mm right middle lobe nodule (3/109) is new. Chronic volume loss in the medial right middle lobe and lingula. Calcified granulomas. Small bilateral pleural effusions. Minimal debris in the trachea. Upper Abdomen: Visualized portions of the liver, gallbladder and adrenal glands are unremarkable. Stones are seen in the kidneys. Visualized portions of the spleen, pancreas, stomach and bowel are unremarkable with exception of a small hiatal hernia. Musculoskeletal: Degenerative changes in the spine. L2 compression fracture is old. Old sternal fracture. IMPRESSION: 1. Spiculated right upper lobe nodule is stable and remains worrisome for low-grade adenocarcinoma. 2. New 5 mm right middle lobe nodule. Continued attention on follow-up exams is warranted. 3. Congestive heart failure. 4. Bilateral renal stones. 5. Aortic atherosclerosis (ICD10-I70.0). Coronary artery calcification. 6. Ascending aortic aneurysm, stable. Ascending thoracic aortic aneurysm. Recommend semi-annual imaging followup by CTA or MRA and referral to cardiothoracic surgery if not already obtained. This recommendation follows 2010 ACCF/AHA/AATS/ACR/ASA/SCA/SCAI/SIR/STS/SVM Guidelines for  the Diagnosis and Management of Patients With Thoracic Aortic Disease. Circulation. 2010; 121: W102-V253. Aortic aneurysm NOS (ICD10-I71.9). 7. Enlarged pulmonic trunk, indicative of pulmonary arterial hypertension. Electronically Signed   By: Leanna Battles M.D.   On: 07/07/2020 13:14    ASSESSMENT: Right upper lobe lung mass.  PLAN:    1. Right upper lobe lung mass: CT scan results from July 07, 2020 reviewed independently and reported as above with continued progression of right upper lobe lesion highly suspicious for underlying indolent bronchogenic  carcinoma. Previously, case discussed at multidisciplinary tumor board and consensus agreed that was this is likely a low-grade malignancy.  We once again discussed possible biopsy and treatment with XRT, but patient would rather wait until the fall since he is "too busy" over the summer.  No intervention was needed.  Continue active surveillance.  Return to clinic in 6 months with repeat imaging and further evaluation.    I spent a total of 20 minutes reviewing chart data, face-to-face evaluation with the patient, counseling and coordination of care as detailed above.     Patient expressed understanding and was in agreement with this plan. He also understands that He can call clinic at any time with any questions, concerns, or complaints.   Cancer Staging No matching staging information was found for the patient.  Jeralyn Ruths, MD   07/09/2020 4:20 PM

## 2020-07-07 ENCOUNTER — Other Ambulatory Visit: Payer: Self-pay

## 2020-07-07 ENCOUNTER — Ambulatory Visit
Admission: RE | Admit: 2020-07-07 | Discharge: 2020-07-07 | Disposition: A | Discharge status: Home / Self Care | Payer: Medicare Other | Source: Ambulatory Visit | Attending: Oncology | Admitting: Oncology

## 2020-07-07 DIAGNOSIS — R918 Other nonspecific abnormal finding of lung field: Secondary | ICD-10-CM | POA: Diagnosis not present

## 2020-07-07 DIAGNOSIS — R0602 Shortness of breath: Secondary | ICD-10-CM | POA: Diagnosis not present

## 2020-07-09 ENCOUNTER — Inpatient Hospital Stay: Payer: Medicare Other | Attending: Oncology | Admitting: Oncology

## 2020-07-09 ENCOUNTER — Encounter: Payer: Self-pay | Admitting: Oncology

## 2020-07-09 VITALS — BP 130/93 | HR 69 | Temp 97.4°F | Resp 22 | Wt 182.7 lb

## 2020-07-09 DIAGNOSIS — Z833 Family history of diabetes mellitus: Secondary | ICD-10-CM | POA: Diagnosis not present

## 2020-07-09 DIAGNOSIS — R918 Other nonspecific abnormal finding of lung field: Secondary | ICD-10-CM | POA: Diagnosis not present

## 2020-07-09 DIAGNOSIS — Z87891 Personal history of nicotine dependence: Secondary | ICD-10-CM | POA: Diagnosis not present

## 2020-07-09 DIAGNOSIS — N400 Enlarged prostate without lower urinary tract symptoms: Secondary | ICD-10-CM | POA: Diagnosis not present

## 2020-07-09 DIAGNOSIS — Z79899 Other long term (current) drug therapy: Secondary | ICD-10-CM | POA: Diagnosis not present

## 2020-07-09 DIAGNOSIS — I110 Hypertensive heart disease with heart failure: Secondary | ICD-10-CM | POA: Insufficient documentation

## 2020-07-09 DIAGNOSIS — I509 Heart failure, unspecified: Secondary | ICD-10-CM | POA: Insufficient documentation

## 2020-07-09 DIAGNOSIS — Z8249 Family history of ischemic heart disease and other diseases of the circulatory system: Secondary | ICD-10-CM | POA: Insufficient documentation

## 2020-07-14 DIAGNOSIS — H6982 Other specified disorders of Eustachian tube, left ear: Secondary | ICD-10-CM | POA: Diagnosis not present

## 2020-07-18 DIAGNOSIS — I10 Essential (primary) hypertension: Secondary | ICD-10-CM | POA: Diagnosis not present

## 2020-07-18 DIAGNOSIS — N200 Calculus of kidney: Secondary | ICD-10-CM | POA: Diagnosis not present

## 2020-07-18 DIAGNOSIS — N182 Chronic kidney disease, stage 2 (mild): Secondary | ICD-10-CM | POA: Diagnosis not present

## 2020-08-11 DIAGNOSIS — I10 Essential (primary) hypertension: Secondary | ICD-10-CM | POA: Diagnosis not present

## 2020-08-11 DIAGNOSIS — N183 Chronic kidney disease, stage 3 unspecified: Secondary | ICD-10-CM | POA: Diagnosis not present

## 2020-08-26 DIAGNOSIS — H9212 Otorrhea, left ear: Secondary | ICD-10-CM | POA: Diagnosis not present

## 2020-08-26 DIAGNOSIS — H6982 Other specified disorders of Eustachian tube, left ear: Secondary | ICD-10-CM | POA: Diagnosis not present

## 2020-09-01 ENCOUNTER — Ambulatory Visit (INDEPENDENT_AMBULATORY_CARE_PROVIDER_SITE_OTHER): Payer: Medicare Other | Admitting: Internal Medicine

## 2020-09-01 ENCOUNTER — Encounter: Payer: Self-pay | Admitting: Internal Medicine

## 2020-09-01 ENCOUNTER — Other Ambulatory Visit: Payer: Self-pay

## 2020-09-01 VITALS — BP 146/84 | HR 83 | Temp 97.7°F | Resp 16 | Ht 69.0 in | Wt 176.1 lb

## 2020-09-01 DIAGNOSIS — M810 Age-related osteoporosis without current pathological fracture: Secondary | ICD-10-CM

## 2020-09-01 DIAGNOSIS — I700 Atherosclerosis of aorta: Secondary | ICD-10-CM

## 2020-09-01 DIAGNOSIS — N182 Chronic kidney disease, stage 2 (mild): Secondary | ICD-10-CM

## 2020-09-01 DIAGNOSIS — J432 Centrilobular emphysema: Secondary | ICD-10-CM | POA: Diagnosis not present

## 2020-09-01 DIAGNOSIS — I712 Thoracic aortic aneurysm, without rupture: Secondary | ICD-10-CM

## 2020-09-01 DIAGNOSIS — R918 Other nonspecific abnormal finding of lung field: Secondary | ICD-10-CM | POA: Diagnosis not present

## 2020-09-01 DIAGNOSIS — C61 Malignant neoplasm of prostate: Secondary | ICD-10-CM

## 2020-09-01 DIAGNOSIS — I7121 Aneurysm of the ascending aorta, without rupture: Secondary | ICD-10-CM

## 2020-09-01 MED ORDER — ATORVASTATIN CALCIUM 20 MG PO TABS: 20.0000 mg | ORAL_TABLET | ORAL | 3 refills | Status: DC

## 2020-09-01 NOTE — Patient Instructions (Addendum)
You are doing EXTREMELY WELL!!    I do recommend resuming atorvastatin to prevent strokes and heart attacks by stabilizieing the placuqe that ws seen in your aorta.  You can start with every other day dosing   Your anemia is too mild to be causing symptoms .  It's your heart causing your symptoms, so:   I recommend [place]...   BONE DENSITY test has been ordered to check on your bone strength .

## 2020-09-01 NOTE — Assessment & Plan Note (Signed)
NO BIOPSY YET.  NEXT CT SCAN is scheduled for Sept  16 and biopsy plan will depend on whether it has increased in size.

## 2020-09-01 NOTE — Progress Notes (Signed)
Subjective:  Patient ID: Eric Hicks, male    DOB: 08-Jul-1926  Age: 87 y.o. MRN: [id number]  CC: The primary encounter diagnosis was Osteoporosis without current pathological fracture, unspecified osteoporosis type. Diagnoses of Mass of upper lobe of right lung, Centrilobular emphysema ([place]), Aortic arch atherosclerosis ([place]), Prostate cancer [facility]), Thoracic ascending aortic aneurysm ([place]), and CKD (chronic kidney disease), stage II were also pertinent to this visit.  HPI Eric Hicks presents for follow up on multiple issues  This visit occurred during the SARS-CoV-2 public health emergency.  Safety protocols were in place, including screening questions prior to the visit, additional usage of staff PPE, and extensive cleaning of exam room while observing appropriate contact time as indicated for disinfecting solutions.   CKD STAGE 3:  Cr has been mproving.  Sees nephrology semi annually .  Mild anemia discussed per patient concern that it is causing his fatigue.  CHF;   Last EF 35% BY 2020 ECHO.  He is avoiding  SALT.   WEIGHS DAILY and brought records of last 2 months for review.   WEIGHT HAS BEEN STABLE AT HOME FOR the LAST MONTH except for one period of weight gain of 3 lbs caused by dietary indiscretions.   Took the furosemide daily   During that period but once weight returned to baseline he reduced dosing to every other day to allow increased activity .  Denies SOB and fatigue with ADL's,   Just with yardwork.  But enjoys it so he admits he "does too much at once",  does it all in one day .  Now taking a nap in the afternoon . Has a 10 acre field.  Mows and tends one acre   AORTIC ANEURYSM:  He is  CHECKING BP AND PULSE DAILY.  HIS SYSTOLIC HAS RANGED FROM 90 TO 130.  MOSTLY 120-130.  DENIES DIZZINESS  PULSE IN THE 60'S 4  HAD A FALL WHILE WALKING IN THE DARK AND STEPPING OFF AN UNFAMILIAR CURB.  NO INJURIES   PROSTATE CANCER :  Treated without prostatectomy,  Managed with  Leuprolide .   ANNUAL  PSAS HAVE BEEN < 0.1per patient   Getting DEXA scans and treatment of osteoporosis.  No fractures.   LUNG mass:  Presumed to be a slow growing Lung CA CA  : SEEING Oncology [facility] )   SUGGESTING BIOPSY if repeat scan shows it is increasing in size.   Outpatient Medications Prior to Visit  Medication Sig Dispense Refill  . AMBULATORY NON FORMULARY MEDICATION Joint Advantage Gold 2 tablets daily    . calcium carbonate (OS-CAL) 600 MG TABS Take 600 mg by mouth daily with breakfast.     . carvedilol (COREG) 6.25 MG tablet Take 6.25 mg by mouth 2 (two) times daily.    . dorzolamide-timolol (COSOPT) 22.3-6.8 MG/ML ophthalmic solution TAKE 1 DROP(S) IN RIGHT EYE 2 TIMES A DAY  5  . doxazosin (CARDURA) 1 MG tablet TAKE 1 TABLET BY MOUTH EVERY DAY 90 tablet 1  . fluticasone (FLONASE) 50 MCG/ACT nasal spray Place into both nostrils daily.    . furosemide (LASIX) 20 MG tablet TAKE 2 TABLETS BY MOUTH DAILY AS NEEDED. 180 tablet 1  . latanoprost (XALATAN) 0.005 % ophthalmic solution Place 1 drop into both eyes at bedtime.  5  . leuprolide, 6 Month, (ELIGARD) 45 MG injection Inject 45 mg into the skin every 6 (six) months.    Marland Kitchen ofloxacin (FLOXIN) 0.3 % OTIC solution LOCATION: LEFT EAR. PLACE  6 DROPS INTO THE LEFT EAR AT BEDTIME FOR 10 DAYS    . Vitamin D, Cholecalciferol, 25 MCG (1000 UT) CAPS Take 1 capsule by mouth daily.    Grant Ruts INHUB 250-50 MCG/DOSE AEPB INHALE 1 PUFF INTO THE LUNGS TWICE DAILY 180 each 3   No facility-administered medications prior to visit.    Review of Systems;  Patient denies headache, fevers, malaise, unintentional weight loss, skin rash, eye pain, sinus congestion and sinus pain, sore throat, dysphagia,  hemoptysis , cough, dyspnea, wheezing, chest pain, palpitations, orthopnea, edema, abdominal pain, nausea, melena, diarrhea, constipation, flank pain, dysuria, hematuria, urinary  Frequency, nocturia, numbness, tingling, seizures,  Focal weakness, Loss of  consciousness,  Tremor, insomnia, depression, anxiety, and suicidal ideation.      Objective:  BP (!) 146/84 (BP Location: Left Arm, Patient Position: Sitting, Cuff Size: Normal)   Pulse 83   Temp 97.7 F (36.5 C) (Oral)   Resp 16   Ht 5\' 9"  (1.753 m)   Wt 176 lb 1.9 oz (79.9 kg)   SpO2 98%   BMI 26.01 kg/m   BP Readings from Last 3 Encounters:  09/01/20 (!) 146/84  07/09/20 (!) 130/93  04/07/20 132/88    Wt Readings from Last 3 Encounters:  09/01/20 176 lb 1.9 oz (79.9 kg)  07/09/20 182 lb 11.2 oz (82.9 kg)  04/07/20 177 lb 3.2 oz (80.4 kg)    General appearance: alert, cooperative and appears stated age Ears: normal TM's and external ear canals both ears Throat: lips, mucosa, and tongue normal; teeth and gums normal Neck: no adenopathy, no carotid bruit, supple, symmetrical, trachea midline and thyroid not enlarged, symmetric, no tenderness/mass/nodules Back: symmetric, no curvature. ROM normal. No CVA tenderness. Lungs: clear to auscultation bilaterally Heart: regular rate and rhythm, S1, S2 normal, no murmur, click, rub or gallop Abdomen: soft, non-tender; bowel sounds normal; no masses,  no organomegaly Pulses: 2+ and symmetric Skin: Skin color, texture, turgor normal. No rashes or lesions Lymph nodes: Cervical, supraclavicular, and axillary nodes normal.  No results found for: HGBA1C  Lab Results  Component Value Date   CREATININE 1.50 03/03/2020   CREATININE 1.69 (H) 08/30/2019   CREATININE 1.50 (H) 10/02/2018    Lab Results  Component Value Date   WBC 5.9 08/30/2019   HGB 13.2 08/30/2019   HCT 39.1 08/30/2019   PLT 187.0 08/30/2019   GLUCOSE 88 03/03/2020   CHOL 125 03/03/2020   TRIG 58.0 03/03/2020   HDL 70.60 03/03/2020   LDLCALC 43 03/03/2020   ALT 8 03/03/2020   AST 15 03/03/2020   NA 143 03/03/2020   K 4.5 03/03/2020   CL 107 03/03/2020   CREATININE 1.50 03/03/2020   BUN 30 (H) 03/03/2020   CO2 30 03/03/2020   TSH 1.69 06/14/2018   PSA  13.67 (H) 05/15/2014   MICROALBUR <0.7 06/20/2014    CT Chest Wo Contrast  Result Date: 07/07/2020 CLINICAL DATA:  Non-small cell lung cancer. Shortness of breath with exertion for 1 year. EXAM: CT CHEST WITHOUT CONTRAST TECHNIQUE: Multidetector CT imaging of the chest was performed following the standard protocol without IV contrast. COMPARISON:  03/31/2020. FINDINGS: Cardiovascular: Atherosclerotic calcification of the aorta, aortic valve and coronary arteries. Ascending aorta measures 4.8 cm. Pulmonic trunk and heart are enlarged. No pericardial effusion. Mediastinum/Nodes: No pathologically enlarged mediastinal or axillary lymph nodes. Hilar regions are difficult to evaluate without IV contrast. Calcified subcarinal lymph node. Mid periesophageal lymph nodes measure up to approximately 10 mm, unchanged. Esophagus  is grossly unremarkable. Lungs/Pleura: Image quality is degraded by respiratory motion. Spiculated nodule in the apical segment right upper lobe is stable in size, measuring 1.6 x 2.4 cm, with slight differences in measurement technique are considered. Mild peribronchovascular ground-glass and basilar septal thickening. 5 mm right middle lobe nodule (3/109) is new. Chronic volume loss in the medial right middle lobe and lingula. Calcified granulomas. Small bilateral pleural effusions. Minimal debris in the trachea. Upper Abdomen: Visualized portions of the liver, gallbladder and adrenal glands are unremarkable. Stones are seen in the kidneys. Visualized portions of the spleen, pancreas, stomach and bowel are unremarkable with exception of a small hiatal hernia. Musculoskeletal: Degenerative changes in the spine. L2 compression fracture is old. Old sternal fracture. IMPRESSION: 1. Spiculated right upper lobe nodule is stable and remains worrisome for low-grade adenocarcinoma. 2. New 5 mm right middle lobe nodule. Continued attention on follow-up exams is warranted. 3. Congestive heart failure. 4.  Bilateral renal stones. 5. Aortic atherosclerosis (ICD10-I70.0). Coronary artery calcification. 6. Ascending aortic aneurysm, stable. Ascending thoracic aortic aneurysm. Recommend semi-annual imaging followup by CTA or MRA and referral to cardiothoracic surgery if not already obtained. This recommendation follows 2010 ACCF/AHA/AATS/ACR/ASA/SCA/SCAI/SIR/STS/SVM Guidelines for the Diagnosis and Management of Patients With Thoracic Aortic Disease. Circulation. 2010; 121: K440-N027. Aortic aneurysm NOS (ICD10-I71.9). 7. Enlarged pulmonic trunk, indicative of pulmonary arterial hypertension. Electronically Signed   By: Lorin Picket M.D.   On: 07/07/2020 13:14    Assessment & Plan:   Problem List Items Addressed This Visit      Unprioritized   Aortic arch atherosclerosis ([place])    He is receptive to resuming atorvastatin today after reviewing the CT and the last note from [place].        Relevant Medications   atorvastatin (LIPITOR) 20 MG tablet   CKD (chronic kidney disease), stage II    GFR has actually improved . Continue qod furosemide, statin and BP control on minimal dose of doxazosin   Lab Results  Component Value Date   CREATININE 1.50 03/03/2020         COPD (chronic obstructive pulmonary disease) with emphysema (HCC)    asymptomtci with ADLS,  aggravated by overexertion      Relevant Medications   fluticasone (FLONASE) 50 MCG/ACT nasal spray   Mass of upper lobe of right lung    NO BIOPSY YET.  NEXT CT SCAN is scheduled for Sept  16 and biopsy plan will depend on whether it has increased in size.       Osteoporosis - Primary   Relevant Orders   DG Bone Density   Prostate cancer ([place])    No intervention given age.  Managed with Lupron.   Urology   Following PSAs ,which have been <1 per patient .  Office notes not available for review from urologist         Thoracic ascending aortic aneurysm ([place])    No intervention planned due to age.  Continue aggressive control of  blood pressure. (goal 253 or less systolic _      Relevant Medications   atorvastatin (LIPITOR) 20 MG tablet      I am having [name] C. Stratton "Clay" start on atorvastatin. I am also having him maintain his calcium carbonate, AMBULATORY NON FORMULARY MEDICATION, dorzolamide-timolol, leuprolide (6 Month), latanoprost, carvedilol, Wixela Inhub, Vitamin D (Cholecalciferol), doxazosin, furosemide, fluticasone, and ofloxacin.  Meds ordered this encounter  Medications  . atorvastatin (LIPITOR) 20 MG tablet    Sig: Take 1 tablet (20  mg total) by mouth every other day.    Dispense:  45 tablet    Refill:  3   A total of 40 minutes was spent with patient more than half of which was spent in counseling patient on his heart, kidney and lung conditions (see  above mentioned issues)  , reviewing and explaining recent labs and imaging studies done, and coordination of care.  There are no discontinued medications.  Follow-up: Return in about 6 months (around 03/04/2021).   Crecencio Mc, MD

## 2020-09-01 NOTE — Assessment & Plan Note (Signed)
asymptomtci with ADLS,  aggravated by overexertion

## 2020-09-02 ENCOUNTER — Encounter: Payer: Self-pay | Admitting: Internal Medicine

## 2020-09-02 NOTE — Assessment & Plan Note (Addendum)
No intervention given age.  Managed with Lupron.   Urology   Following PSAs ,which have been <1 per patient .  Office notes not available for review from urologist

## 2020-09-02 NOTE — Assessment & Plan Note (Signed)
He is receptive to resuming atorvastatin today after reviewing the CT and the last note from [place].

## 2020-09-02 NOTE — Assessment & Plan Note (Signed)
No intervention planned due to age.  Continue aggressive control of blood pressure. (goal 585 or less systolic _

## 2020-09-02 NOTE — Assessment & Plan Note (Signed)
GFR has actually improved . Continue qod furosemide, statin and BP control on minimal dose of doxazosin   Lab Results  Component Value Date   CREATININE 1.50 03/03/2020

## 2020-09-09 DIAGNOSIS — H6983 Other specified disorders of Eustachian tube, bilateral: Secondary | ICD-10-CM | POA: Diagnosis not present

## 2020-09-09 DIAGNOSIS — H9212 Otorrhea, left ear: Secondary | ICD-10-CM | POA: Diagnosis not present

## 2020-09-30 DIAGNOSIS — H34832 Tributary (branch) retinal vein occlusion, left eye, with macular edema: Secondary | ICD-10-CM | POA: Diagnosis not present

## 2020-10-06 ENCOUNTER — Other Ambulatory Visit: Payer: Self-pay | Admitting: Internal Medicine

## 2020-10-06 ENCOUNTER — Ambulatory Visit (INDEPENDENT_AMBULATORY_CARE_PROVIDER_SITE_OTHER): Payer: Medicare Other

## 2020-10-06 VITALS — BP 146/79 | HR 68 | Temp 96.7°F | Resp 14 | Ht 69.0 in | Wt 176.0 lb

## 2020-10-06 DIAGNOSIS — Z0000 Encounter for general adult medical examination without abnormal findings: Secondary | ICD-10-CM | POA: Diagnosis not present

## 2020-10-06 NOTE — Patient Instructions (Addendum)
Eric Hicks , Thank you for taking time to come for your Medicare Wellness Visit. I appreciate your ongoing commitment to your health goals. Please review the following plan we discussed and let me know if I can assist you in the future.   These are the goals we discussed:  Goals       Patient Stated     Follow up with Primary Care Provider (pt-stated)      As needed          This is a list of the screening recommended for you and due dates:  Health Maintenance  Topic Date Due   Zoster (Shingles) Vaccine (1 of 2) Never done   Flu Shot  11/24/2020   COVID-19 Vaccine (5 - Booster for [employer] series) 12/14/2020   Tetanus Vaccine  06/13/2022   Pneumonia vaccines  Completed   HPV Vaccine  Aged Out     Advanced directives: on file  Conditions/risks identified: none new  Follow up in one year for your annual wellness visit.   Preventive Care 85 Years and Older, Male Preventive care refers to lifestyle choices and visits with your health care provider that can promote health and wellness. What does preventive care include? A yearly physical exam. This is also called an annual well check. Dental exams once or twice a year. Routine eye exams. Ask your health care provider how often you should have your eyes checked. Personal lifestyle choices, including: Daily care of your teeth and gums. Regular physical activity. Eating a healthy diet. Avoiding tobacco and drug use. Limiting alcohol use. Practicing safe sex. Taking low doses of aspirin every day. Taking vitamin and mineral supplements as recommended by your health care provider. What happens during an annual well check? The services and screenings done by your health care provider during your annual well check will depend on your age, overall health, lifestyle risk factors, and family history of disease. Counseling  Your health care provider may ask you questions about your: Alcohol use. Tobacco use. Drug use. Emotional  well-being. Home and relationship well-being. Sexual activity. Eating habits. History of falls. Memory and ability to understand (cognition). Work and work Statistician. Screening  You may have the following tests or measurements: Height, weight, and BMI. Blood pressure. Lipid and cholesterol levels. These may be checked every 5 years, or more frequently if you are over 49 years old. Skin check. Lung cancer screening. You may have this screening every year starting at age 61 if you have a 30-pack-year history of smoking and currently smoke or have quit within the past 15 years. Fecal occult blood test (FOBT) of the stool. You may have this test every year starting at age 70. Flexible sigmoidoscopy or colonoscopy. You may have a sigmoidoscopy every 5 years or a colonoscopy every 10 years starting at age 67. Prostate cancer screening. Recommendations will vary depending on your family history and other risks. Hepatitis C blood test. Hepatitis B blood test. Sexually transmitted disease (STD) testing. Diabetes screening. This is done by checking your blood sugar (glucose) after you have not eaten for a while (fasting). You may have this done every 1-3 years. Abdominal aortic aneurysm (AAA) screening. You may need this if you are a current or former smoker. Osteoporosis. You may be screened starting at age 99 if you are at high risk. Talk with your health care provider about your test results, treatment options, and if necessary, the need for more tests. Vaccines  Your health care provider may  recommend certain vaccines, such as: Influenza vaccine. This is recommended every year. Tetanus, diphtheria, and acellular pertussis (Tdap, Td) vaccine. You may need a Td booster every 10 years. Zoster vaccine. You may need this after age 19. Pneumococcal 13-valent conjugate (PCV13) vaccine. One dose is recommended after age 10. Pneumococcal polysaccharide (PPSV23) vaccine. One dose is recommended after  age 20. Talk to your health care provider about which screenings and vaccines you need and how often you need them. This information is not intended to replace advice given to you by your health care provider. Make sure you discuss any questions you have with your health care provider. Document Released: 05/09/2015 Document Revised: 12/31/2015 Document Reviewed: 02/11/2015 Elsevier Interactive Patient Education  2017 [place] Prevention in the Home Falls can cause injuries. They can happen to people of all ages. There are many things you can do to make your home safe and to help prevent falls. What can I do on the outside of my home? Regularly fix the edges of walkways and driveways and fix any cracks. Remove anything that might make you trip as you walk through a door, such as a raised step or threshold. Trim any bushes or trees on the path to your home. Use bright outdoor lighting. Clear any walking paths of anything that might make someone trip, such as rocks or tools. Regularly check to see if handrails are loose or broken. Make sure that both sides of any steps have handrails. Any raised decks and porches should have guardrails on the edges. Have any leaves, snow, or ice cleared regularly. Use sand or salt on walking paths during winter. Clean up any spills in your garage right away. This includes oil or grease spills. What can I do in the bathroom? Use night lights. Install grab bars by the toilet and in the tub and shower. Do not use towel bars as grab bars. Use non-skid mats or decals in the tub or shower. If you need to sit down in the shower, use a plastic, non-slip stool. Keep the floor dry. Clean up any water that spills on the floor as soon as it happens. Remove soap buildup in the tub or shower regularly. Attach bath mats securely with double-sided non-slip rug tape. Do not have throw rugs and other things on the floor that can make you trip. What can I do in the  bedroom? Use night lights. Make sure that you have a light by your bed that is easy to reach. Do not use any sheets or blankets that are too big for your bed. They should not hang down onto the floor. Have a firm chair that has side arms. You can use this for support while you get dressed. Do not have throw rugs and other things on the floor that can make you trip. What can I do in the kitchen? Clean up any spills right away. Avoid walking on wet floors. Keep items that you use a lot in easy-to-reach places. If you need to reach something above you, use a strong step stool that has a grab bar. Keep electrical cords out of the way. Do not use floor polish or wax that makes floors slippery. If you must use wax, use non-skid floor wax. Do not have throw rugs and other things on the floor that can make you trip. What can I do with my stairs? Do not leave any items on the stairs. Make sure that there are handrails on both sides of  the stairs and use them. Fix handrails that are broken or loose. Make sure that handrails are as long as the stairways. Check any carpeting to make sure that it is firmly attached to the stairs. Fix any carpet that is loose or worn. Avoid having throw rugs at the top or bottom of the stairs. If you do have throw rugs, attach them to the floor with carpet tape. Make sure that you have a light switch at the top of the stairs and the bottom of the stairs. If you do not have them, ask someone to add them for you. What else can I do to help prevent falls? Wear shoes that: Do not have high heels. Have rubber bottoms. Are comfortable and fit you well. Are closed at the toe. Do not wear sandals. If you use a stepladder: Make sure that it is fully opened. Do not climb a closed stepladder. Make sure that both sides of the stepladder are locked into place. Ask someone to hold it for you, if possible. Clearly mark and make sure that you can see: Any grab bars or  handrails. First and last steps. Where the edge of each step is. Use tools that help you move around (mobility aids) if they are needed. These include: Canes. Walkers. Scooters. Crutches. Turn on the lights when you go into a dark area. Replace any light bulbs as soon as they burn out. Set up your furniture so you have a clear path. Avoid moving your furniture around. If any of your floors are uneven, fix them. If there are any pets around you, be aware of where they are. Review your medicines with your doctor. Some medicines can make you feel dizzy. This can increase your chance of falling. Ask your doctor what other things that you can do to help prevent falls. This information is not intended to replace advice given to you by your health care provider. Make sure you discuss any questions you have with your health care provider. Document Released: 02/06/2009 Document Revised: 09/18/2015 Document Reviewed: 05/17/2014 Elsevier Interactive Patient Education  2017 [employer].

## 2020-10-06 NOTE — Progress Notes (Signed)
Subjective:   Eric Hicks is a 87 y.o. male who presents for Medicare Annual/Subsequent preventive examination.  Review of Systems    No ROS.  Medicare Wellness    Cardiac Risk Factors include: advanced age (>88mn, >>58women);male gender     Objective:    Today's Vitals   10/06/20 1041  BP: (!) 146/79  Pulse: 68  Resp: 14  Temp: (!) 96.7 F (35.9 C)  SpO2: 98%  Weight: 176 lb (79.8 kg)  Height: 5' 9"  (1.753 m)   Body mass index is 25.99 kg/m.  Advanced Directives 10/06/2020 07/09/2020 01/03/2020 10/04/2019 09/28/2019 01/16/2019 10/02/2018  Does Patient Have a Medical Advance Directive? Yes No Yes Yes Yes Yes Yes  Type of AParamed[place]of [place] will - [place] will [place] will HC[place]Living will H[place]onLiving will HCreswellLiving will  Does patient want to make changes to medical advance directive? No - Patient declined - - No - Patient declined No - Patient declined - No - Patient declined  Copy of [place]in Chart? Yes - validated most recent copy scanned in chart (See row information) - - Yes - validated most recent copy scanned in chart (See row information) No - copy requested - Yes - validated most recent copy scanned in chart (See row information)    Current Medications (verified) Outpatient Encounter Medications as of 10/06/2020  Medication Sig   AMBULATORY NON FORMULARY MEDICATION Joint Advantage Gold 2 tablets daily   atorvastatin (LIPITOR) 20 MG tablet Take 1 tablet (20 mg total) by mouth every other day.   calcium carbonate (OS-CAL) 600 MG TABS Take 600 mg by mouth daily with breakfast.    carvedilol (COREG) 6.25 MG tablet Take 6.25 mg by mouth 2 (two) times daily.   dorzolamide-timolol (COSOPT) 22.3-6.8 MG/ML ophthalmic solution TAKE 1 DROP(S) IN RIGHT EYE 2 TIMES A DAY   doxazosin (CARDURA) 1 MG  tablet TAKE 1 TABLET BY MOUTH EVERY DAY   furosemide (LASIX) 20 MG tablet TAKE 2 TABLETS BY MOUTH DAILY AS NEEDED.   latanoprost (XALATAN) 0.005 % ophthalmic solution Place 1 drop into both eyes at bedtime.   leuprolide, 6 Month, (ELIGARD) 45 MG injection Inject 45 mg into the skin every 6 (six) months.   Vitamin D, Cholecalciferol, 25 MCG (1000 UT) CAPS Take 1 capsule by mouth daily.   WIXELA INHUB 250-50 MCG/DOSE AEPB INHALE 1 PUFF INTO THE LUNGS TWICE DAILY   [DISCONTINUED] fluticasone (FLONASE) 50 MCG/ACT nasal spray Place into both nostrils daily.   [DISCONTINUED] ofloxacin (FLOXIN) 0.3 % OTIC solution LOCATION: LEFT EAR. PLACE 6 DROPS INTO THE LEFT EAR AT BEDTIME FOR 10 DAYS   No facility-administered encounter medications on file as of 10/06/2020.    Allergies (verified) Tylenol [acetaminophen]   History: Past Medical History:  Diagnosis Date   BPH (benign prostatic hyperplasia)    managed by DOlena Hicks  COPD (chronic obstructive pulmonary disease) (HCC)    Elevated PSA, between 10 and less than 20 ng/ml    DOlena Hicks watchful waiting   Hypertension    Osteoporosis    by DEXA   Past Surgical History:  Procedure Laterality Date   TYMPANOSTOMY TUBE PLACEMENT  2017   Family History  Problem Relation Age of Onset   Diabetes Mother    Hypertension Father    Cancer Brother 813      multiple myeloma   Social  History   Socioeconomic History   Marital status: Widowed    Spouse name: Not on file   Number of children: Not on file   Years of education: Not on file   Highest education level: Not on file  Occupational History   Not on file  Tobacco Use   Smoking status: Former    Pack years: 0.00   Smokeless tobacco: Never  Vaping Use   Vaping Use: Never used  Substance and Sexual Activity   Alcohol use: No   Drug use: Not on file   Sexual activity: Never  Other Topics Concern   Not on file  Social History Narrative   Regular exercise-yew   Social Determinants of  Health   Financial Resource Strain: Low Risk    Difficulty of Paying Living Expenses: Not hard at all  Food Insecurity: No Food Insecurity   Worried About Charity fundraiser in the Last Year: Never true   [place] in the Last Year: Never true  Transportation Needs: No Transportation Needs   Lack of Transportation (Medical): No   Lack of Transportation (Non-Medical): No  Physical Activity: Not on file  Stress: No Stress Concern Present   Feeling of Stress : Not at all  Social Connections: Unknown   Frequency of Communication with Friends and Family: More than three times a week   Frequency of Social Gatherings with Friends and Family: Not on file   Attends Religious Services: Not on file   Active Member of Clubs or Organizations: Not on file   Attends Archivist Meetings: Not on file   Marital Status: Widowed    Tobacco Counseling Counseling given: Not Answered   Clinical Intake:  Pre-visit preparation completed: Yes        Diabetes: No  How often do you need to have someone help you when you read instructions, pamphlets, or other written materials from your doctor or pharmacy?: 1 - Never  Interpreter Needed?: No      Activities of Daily Living In your present state of health, do you have any difficulty performing the following activities: 10/06/2020  Hearing? Y  Comment Hearing aids  Vision? N  Difficulty concentrating or making decisions? N  Walking or climbing stairs? N  Dressing or bathing? N  Doing errands, shopping? N  Preparing Food and eating ? N  Using the Toilet? N  In the past six months, have you accidently leaked urine? N  Comment Followed by Urology  Do you have problems with loss of bowel control? N  Managing your Medications? N  Managing your Finances? N  Housekeeping or managing your Housekeeping? N  Some recent data might be hidden    Patient Care Team: Eric Mc, MD as PCP - General (Internal Medicine) Telford Nab, RN as Oncology Nurse Navigator  Indicate any recent Medical Services you may have received from other than [facility] providers in the past year (date may be approximate).     Assessment:   This is a routine wellness examination for Eric Hicks.  Hearing/Vision screen Hearing Screening - Comments:: Hearing aid, bilateral  Vision Screening - Comments:: Followed by [facility]  Wears corrective lenses  Cataract extraction, bilateral  They have regular follow up with the ophthalmologist  Dietary issues and exercise activities discussed: Current Exercise Habits: Home exercise routine, Intensity: Mild Healthy diet Good water intake   Goals Addressed  This Visit's Progress     Patient Stated     Follow up with Primary Care Provider (pt-stated)        As needed         Depression Screen PHQ 2/9 Scores 10/06/2020 09/01/2020 10/04/2019 10/02/2018 09/28/2017 09/27/2016 07/24/2015  PHQ - 2 Score 0 0 0 0 0 0 0  PHQ- 9 Score 0 1 - - - 0 -    Fall Risk Fall Risk  10/06/2020 09/01/2020 03/03/2020 10/04/2019 08/30/2019  Falls in the past year? 1 1 0 1 1  Number falls in past yr: 0 1 - 0 1  Injury with Fall? 0 0 - 0 0  Comment - - - He tripped over a curb 2 weeks ago. -  Risk for fall due to : - - - - -  Risk for fall due to: Comment - - - - -  Follow up Falls evaluation completed Falls evaluation completed Falls evaluation completed Falls evaluation completed;Falls prevention discussed Falls evaluation completed    FALL RISK PREVENTION PERTAINING TO THE HOME: Handrails in use when climbing stairs? Yes Home free of loose throw rugs in walkways, pet beds, electrical cords, etc? Yes  Adequate lighting in your home to reduce risk of falls? Yes   ASSISTIVE DEVICES UTILIZED TO PREVENT FALLS:  Life alert? No  Use of a cane, walker or w/c? No    TIMED UP AND GO:  Was the test performed? Yes .  Length of time to ambulate 10 feet: 10 sec.   Gait steady and fast without use  of assistive device  Cognitive Function: Patient is alert and oriented x3. Enjoys reading and other brain health activities.  MMSE/6CIT deferred. Normal by direct communication/observation.   MMSE - Mini Mental State Exam 09/27/2016  Orientation to time 5  Orientation to Place 5  Registration 3  Attention/ Calculation 5  Recall 3  Language- name 2 objects 2  Language- repeat 1  Language- follow 3 step command 3  Language- read & follow direction 1  Write a sentence 1  Copy design 1  Total score 30     6CIT Screen 10/04/2019 10/02/2018 09/28/2017  What Year? 0 points 0 points 0 points  What month? 0 points 0 points 0 points  What time? 0 points 0 points 0 points  Count back from 20 - 0 points 0 points  Months in reverse 0 points 0 points 0 points  Repeat phrase - 0 points 0 points  Total Score - 0 0    Immunizations Immunization History  Administered Date(s) Administered   Influenza, High Dose Seasonal PF 02/07/2016, 02/09/2017, 02/17/2018, 02/21/2019, 02/13/2020   Influenza-Unspecified 02/26/2011, 02/13/2014, 02/12/2015   PFIZER Comirnaty(Gray Top)Covid-19 Tri-Sucrose Vaccine 08/14/2020   PFIZER(Purple Top)SARS-COV-2 Vaccination 05/17/2019, 06/07/2019, 03/06/2020   Pneumococcal Conjugate-13 11/20/2013   Pneumococcal Polysaccharide-23 11/11/1999, 11/11/2006   Tdap 06/13/2012   Zoster, Live 02/13/2014    TDAP status: Due, Education has been provided regarding the importance of this vaccine. Advised may receive this vaccine at local pharmacy or Health Dept. Aware to provide a copy of the vaccination record if obtained from local pharmacy or Health Dept. Verbalized acceptance and understanding. Deferred.   Health Maintenance Health Maintenance  Topic Date Due   Zoster Vaccines- Shingrix (1 of 2) Never done   INFLUENZA VACCINE  11/24/2020   COVID-19 Vaccine (5 - Booster for Pfizer series) 12/14/2020   TETANUS/TDAP  06/13/2022   PNA vac Low Risk Adult  Completed   HPV  VACCINES  Aged Out   Shingles vaccine- deferred per patient.   Colorectal cancer screening: No longer required.   Lung Cancer Screening: (Low Dose CT Chest recommended if Age 32-80 years, 30 pack-year currently smoking OR have quit w/in 15years.) does not qualify.   Hepatitis C Screening: does not qualify.   Vision Screening: Recommended annual ophthalmology exams for early detection of glaucoma and other disorders of the eye. Is the patient up to date with their annual eye exam?  Yes  Who is the provider or what is the name of the office in which the patient attends annual eye exams? [facility] Screening: Recommended annual dental exams for proper oral hygiene  Community Resource Referral / Chronic Care Management: CRR required this visit?  No   CCM required this visit?  No      Plan:   Keep all routine maintenance appointments.   I have personally reviewed and noted the following in the patient's chart:   Medical and social history Use of alcohol, tobacco or illicit drugs  Current medications and supplements including opioid prescriptions. Patient is not currently taking opioid prescriptions. Functional ability and status Nutritional status Physical activity Advanced directives List of other physicians Hospitalizations, surgeries, and ER visits in previous 12 months Vitals Screenings to include cognitive, depression, and falls Referrals and appointments  In addition, I have reviewed and discussed with patient certain preventive protocols, quality metrics, and best practice recommendations. A written personalized care plan for preventive services as well as general preventive health recommendations were provided to patient.     Varney Biles, LPN   0/30/0923

## 2020-10-07 DIAGNOSIS — H353111 Nonexudative age-related macular degeneration, right eye, early dry stage: Secondary | ICD-10-CM | POA: Diagnosis not present

## 2020-10-07 DIAGNOSIS — H34812 Central retinal vein occlusion, left eye, with macular edema: Secondary | ICD-10-CM | POA: Diagnosis not present

## 2020-10-13 DIAGNOSIS — I700 Atherosclerosis of aorta: Secondary | ICD-10-CM | POA: Diagnosis not present

## 2020-10-13 DIAGNOSIS — N182 Chronic kidney disease, stage 2 (mild): Secondary | ICD-10-CM | POA: Diagnosis not present

## 2020-10-13 DIAGNOSIS — I712 Thoracic aortic aneurysm, without rupture: Secondary | ICD-10-CM | POA: Diagnosis not present

## 2020-10-13 DIAGNOSIS — I480 Paroxysmal atrial fibrillation: Secondary | ICD-10-CM | POA: Diagnosis not present

## 2020-10-13 DIAGNOSIS — I420 Dilated cardiomyopathy: Secondary | ICD-10-CM | POA: Diagnosis not present

## 2020-10-13 DIAGNOSIS — I482 Chronic atrial fibrillation, unspecified: Secondary | ICD-10-CM | POA: Diagnosis not present

## 2020-10-13 DIAGNOSIS — I10 Essential (primary) hypertension: Secondary | ICD-10-CM | POA: Diagnosis not present

## 2020-10-16 ENCOUNTER — Other Ambulatory Visit: Payer: Self-pay | Admitting: Internal Medicine

## 2020-10-21 ENCOUNTER — Encounter: Payer: Self-pay | Admitting: Internal Medicine

## 2020-10-30 ENCOUNTER — Ambulatory Visit
Admission: RE | Admit: 2020-10-30 | Discharge: 2020-10-30 | Disposition: A | Discharge status: Home / Self Care | Payer: Medicare Other | Source: Ambulatory Visit | Attending: Internal Medicine | Admitting: Internal Medicine

## 2020-10-30 ENCOUNTER — Other Ambulatory Visit: Payer: Self-pay

## 2020-10-30 DIAGNOSIS — M810 Age-related osteoporosis without current pathological fracture: Secondary | ICD-10-CM | POA: Insufficient documentation

## 2020-11-10 DIAGNOSIS — H6123 Impacted cerumen, bilateral: Secondary | ICD-10-CM | POA: Diagnosis not present

## 2020-11-10 DIAGNOSIS — H903 Sensorineural hearing loss, bilateral: Secondary | ICD-10-CM | POA: Diagnosis not present

## 2020-11-10 DIAGNOSIS — H6982 Other specified disorders of Eustachian tube, left ear: Secondary | ICD-10-CM | POA: Diagnosis not present

## 2020-11-11 DIAGNOSIS — H34812 Central retinal vein occlusion, left eye, with macular edema: Secondary | ICD-10-CM | POA: Diagnosis not present

## 2020-11-18 DIAGNOSIS — H43312 Vitreous membranes and strands, left eye: Secondary | ICD-10-CM | POA: Diagnosis not present

## 2020-12-16 ENCOUNTER — Other Ambulatory Visit: Payer: Self-pay | Admitting: Internal Medicine

## 2020-12-16 DIAGNOSIS — I509 Heart failure, unspecified: Secondary | ICD-10-CM

## 2020-12-16 DIAGNOSIS — H34812 Central retinal vein occlusion, left eye, with macular edema: Secondary | ICD-10-CM | POA: Diagnosis not present

## 2021-01-09 ENCOUNTER — Other Ambulatory Visit: Payer: Self-pay

## 2021-01-09 ENCOUNTER — Ambulatory Visit
Admission: RE | Admit: 2021-01-09 | Discharge: 2021-01-09 | Disposition: A | Discharge status: Home / Self Care | Payer: Medicare Other | Source: Ambulatory Visit | Attending: Oncology | Admitting: Oncology

## 2021-01-09 DIAGNOSIS — R918 Other nonspecific abnormal finding of lung field: Secondary | ICD-10-CM

## 2021-01-09 DIAGNOSIS — R911 Solitary pulmonary nodule: Secondary | ICD-10-CM | POA: Diagnosis not present

## 2021-01-09 DIAGNOSIS — C3490 Malignant neoplasm of unspecified part of unspecified bronchus or lung: Secondary | ICD-10-CM | POA: Diagnosis not present

## 2021-01-09 DIAGNOSIS — I700 Atherosclerosis of aorta: Secondary | ICD-10-CM | POA: Diagnosis not present

## 2021-01-09 DIAGNOSIS — J439 Emphysema, unspecified: Secondary | ICD-10-CM | POA: Diagnosis not present

## 2021-01-11 NOTE — Progress Notes (Signed)
[facility]  [phone] [phone]) [phone]  ID: Eric Hicks OB: 1926-05-26  MR#: [id number]  QAS#:[id number]  Patient Care Team: Crecencio Mc, MD as PCP - General (Internal Medicine) Telford Nab, RN as Oncology Nurse Navigator  CHIEF COMPLAINT: Right upper lobe lung mass.  INTERVAL HISTORY: Patient returns to clinic today for further evaluation and discussion of his imaging results.  He continues to feel well and remains asymptomatic.  He remains active.  He has no neurologic complaints.  He denies any recent fevers or illnesses.  He has a good appetite and denies weight loss.  He has no chest pain, shortness of breath, cough, or hemoptysis.  He denies any nausea, vomiting, constipation, or diarrhea.  He has no urinary complaints.  Patient feels at his baseline and offers no specific complaints today.  REVIEW OF SYSTEMS:   Review of Systems  Constitutional: Negative.  Negative for fever, malaise/fatigue and weight loss.  Respiratory: Negative.  Negative for cough, hemoptysis and shortness of breath.   Cardiovascular: Negative.  Negative for chest pain and leg swelling.  Gastrointestinal: Negative.  Negative for abdominal pain.  Genitourinary: Negative.  Negative for dysuria.  Musculoskeletal: Negative.  Negative for back pain.  Skin: Negative.  Negative for rash.  Neurological: Negative.  Negative for dizziness, focal weakness, weakness and headaches.  Psychiatric/Behavioral: Negative.  The patient is not nervous/anxious.    As per HPI. Otherwise, a complete review of systems is negative.  PAST MEDICAL HISTORY: Past Medical History:  Diagnosis Date   BPH (benign prostatic hyperplasia)    managed by Olena Heckle   COPD (chronic obstructive pulmonary disease) ([place])    Elevated PSA, between 10 and less than 20 ng/ml    Olena Heckle, watchful waiting   Hypertension    Osteoporosis    by DEXA    PAST SURGICAL HISTORY: Past Surgical History:  Procedure  Laterality Date   TYMPANOSTOMY TUBE PLACEMENT  2017    FAMILY HISTORY: Family History  Problem Relation Age of Onset   Diabetes Mother    Hypertension Father    Cancer Brother 94       multiple myeloma    ADVANCED DIRECTIVES (Y/N):  N  HEALTH MAINTENANCE: Social History   Tobacco Use   Smoking status: Former   Smokeless tobacco: Never  Scientific laboratory technician Use: Never used  Substance Use Topics   Alcohol use: No     Colonoscopy:  PAP:  Bone density:  Lipid panel:  Allergies  Allergen Reactions   Tylenol [Acetaminophen] Rash    Current Outpatient Medications  Medication Sig Dispense Refill   AMBULATORY NON FORMULARY MEDICATION Joint Advantage Gold 2 tablets daily     atorvastatin (LIPITOR) 20 MG tablet Take 1 tablet (20 mg total) by mouth every other day. 45 tablet 3   calcium carbonate (OS-CAL) 600 MG TABS Take 600 mg by mouth daily with breakfast.      carvedilol (COREG) 6.25 MG tablet Take 6.25 mg by mouth 2 (two) times daily.     dorzolamide-timolol (COSOPT) 22.3-6.8 MG/ML ophthalmic solution TAKE 1 DROP(S) IN RIGHT EYE 2 TIMES A DAY  5   doxazosin (CARDURA) 1 MG tablet TAKE 1 TABLET BY MOUTH EVERY DAY 90 tablet 1   furosemide (LASIX) 20 MG tablet TAKE 2 TABLETS BY MOUTH DAILY AS NEEDED 180 tablet 1   latanoprost (XALATAN) 0.005 % ophthalmic solution Place 1 drop into both eyes at bedtime.  5   leuprolide, 6 Month, (ELIGARD) 45  MG injection Inject 45 mg into the skin every 6 (six) months.     Vitamin D, Cholecalciferol, 25 MCG (1000 UT) CAPS Take 1 capsule by mouth daily.     WIXELA INHUB 250-50 MCG/ACT AEPB INHALE 1 PUFF INTO THE LUNGS TWICE DAILY. 180 each 3   No current facility-administered medications for this visit.    OBJECTIVE: Vitals:   01/13/21 1113  BP: (!) 159/87  Pulse: 64  Temp: 97.6 F (36.4 C)     Body mass index is 25.33 kg/m.    ECOG FS:0 - Asymptomatic  General: Well-developed, well-nourished, no acute distress. Eyes: Pink  conjunctiva, anicteric sclera. HEENT: Normocephalic, moist mucous membranes. Lungs: No audible wheezing or coughing. Heart: Regular rate and rhythm. Abdomen: Soft, nontender, no obvious distention. Musculoskeletal: No edema, cyanosis, or clubbing. Neuro: Alert, answering all questions appropriately. Cranial nerves grossly intact. Skin: No rashes or petechiae noted. Psych: Normal affect.   LAB RESULTS:  Lab Results  Component Value Date   NA 143 03/03/2020   K 4.5 03/03/2020   CL 107 03/03/2020   CO2 30 03/03/2020   GLUCOSE 88 03/03/2020   BUN 30 (H) 03/03/2020   CREATININE 1.50 03/03/2020   CALCIUM 9.2 03/03/2020   PROT 5.7 (L) 03/03/2020   ALBUMIN 3.8 03/03/2020   AST 15 03/03/2020   ALT 8 03/03/2020   ALKPHOS 58 03/03/2020   BILITOT 0.7 03/03/2020   GFRNONAA 35 (L) 11/12/2011   GFRAA 40 (L) 11/12/2011    Lab Results  Component Value Date   WBC 5.9 08/30/2019   NEUTROABS 3.5 08/30/2019   HGB 13.2 08/30/2019   HCT 39.1 08/30/2019   MCV 95.2 08/30/2019   PLT 187.0 08/30/2019     STUDIES: CT Chest Wo Contrast  Result Date: 01/10/2021 CLINICAL DATA:  Follow-up non-small cell lung cancer. EXAM: CT CHEST WITHOUT CONTRAST TECHNIQUE: Multidetector CT imaging of the chest was performed following the standard protocol without IV contrast. COMPARISON:  Multiple prior CT examinations. The most recent is 07/07/2020. Prior PET-CT 09/26/2019 FINDINGS: Cardiovascular: Stable mild cardiac enlargement involving all 4 chambers. No pericardial effusion. Stable tortuosity, ectasia and calcification the thoracic aorta. Stable fusiform aneurysmal dilatation of the ascending thoracic aorta which measures a maximum 4.7 cm. Mediastinum/Nodes: No mediastinal or hilar mass or lymphadenopathy. Small scattered lymph nodes are stable. The esophagus is grossly normal. Lungs/Pleura: Stable appearing sub solid nodular lesion in the right upper lobe extending to the pleura. I think this is best measured  on the coronal images and maximum dimensions are 26 x 15 mm on image 83/4. It has definitely increased since the prior CT scan from 2020 the central more solid portion measures approximately 9 mm and this previously measured 8 mm. On the prior study there was a new 5 mm nodule in the right middle lobe but this has resolved and was likely inflammatory. Stable scattered calcified granulomas. No new pulmonary lesions. Hazy ground-glass type opacity noted in both lung bases could suggest an inflammatory process or possible aspiration. Upper Abdomen: No significant upper abdominal findings. Stable aortic calcifications and bilateral renal calculi. No hepatic or adrenal gland lesions are identified without contrast. Musculoskeletal: No chest wall mass, supraclavicular or axillary adenopathy. No significant bony findings. IMPRESSION: 1. Persistent largely subsolid nodular lesion in the right upper lobe. The central more solid portion measures 9 mm. No significant interval change when compared to the most recent chest CT from 07/07/2020. It has definitely increased in size since the prior CT scan from 2020.  2. Interval resolution of the 5 mm right middle lobe nodule seen on the prior CT scan. 3. No mediastinal or hilar mass or adenopathy. 4. Stable scattered calcified granulomas. 5. Hazy ground-glass type opacity in both lung bases could suggest an inflammatory process or possible aspiration. 6. Stable fusiform aneurysmal dilatation of the ascending thoracic aorta measuring a maximum of 4.7 cm. 7. Stable bilateral renal calculi. Aortic Atherosclerosis (ICD10-I70.0) and Emphysema (ICD10-J43.9). Electronically Signed   By: Marijo Sanes M.D.   On: 01/10/2021 17:19     ASSESSMENT: Right upper lobe lung mass.  PLAN:    1. Right upper lobe lung mass: CT scan results from January 09, 2021 reviewed independently and report as above with essentially unchanged lesion of right upper lobe.  Although lesion is stable over the  last 6 months, it has increased since 2020.  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  After lengthy discussion, patient would like to continue to monitor lesion with active surveillance.  He expressed understanding that at some point we will likely need to treat with XRT only.  No intervention is needed.  Return to clinic in 6 months with repeat imaging and further evaluation. 2.  Hypertension: Patient's blood pressure is moderately elevated.  Continue monitoring and treatment per primary care.   I spent a total of 20 minutes reviewing chart data, face-to-face evaluation with the patient, counseling and coordination of care as detailed above.   Patient expressed understanding and was in agreement with this plan. He also understands that He can call clinic at any time with any questions, concerns, or complaints.   Cancer Staging No matching staging information was found for the patient.  Lloyd Huger, MD   01/13/2021 6:56 PM

## 2021-01-13 ENCOUNTER — Encounter: Payer: Self-pay | Admitting: Oncology

## 2021-01-13 ENCOUNTER — Inpatient Hospital Stay: Payer: Medicare Other | Attending: Oncology | Admitting: Oncology

## 2021-01-13 VITALS — BP 159/87 | HR 64 | Temp 97.6°F | Wt 171.5 lb

## 2021-01-13 DIAGNOSIS — Z79899 Other long term (current) drug therapy: Secondary | ICD-10-CM | POA: Diagnosis not present

## 2021-01-13 DIAGNOSIS — N400 Enlarged prostate without lower urinary tract symptoms: Secondary | ICD-10-CM | POA: Diagnosis not present

## 2021-01-13 DIAGNOSIS — I10 Essential (primary) hypertension: Secondary | ICD-10-CM | POA: Insufficient documentation

## 2021-01-13 DIAGNOSIS — Z87891 Personal history of nicotine dependence: Secondary | ICD-10-CM | POA: Insufficient documentation

## 2021-01-13 DIAGNOSIS — R918 Other nonspecific abnormal finding of lung field: Secondary | ICD-10-CM | POA: Insufficient documentation

## 2021-01-13 NOTE — Progress Notes (Signed)
Patient denies new problems/concerns today.   °

## 2021-01-20 DIAGNOSIS — H34812 Central retinal vein occlusion, left eye, with macular edema: Secondary | ICD-10-CM | POA: Diagnosis not present

## 2021-01-21 DIAGNOSIS — H6982 Other specified disorders of Eustachian tube, left ear: Secondary | ICD-10-CM | POA: Diagnosis not present

## 2021-01-21 DIAGNOSIS — H903 Sensorineural hearing loss, bilateral: Secondary | ICD-10-CM | POA: Diagnosis not present

## 2021-01-21 DIAGNOSIS — H6122 Impacted cerumen, left ear: Secondary | ICD-10-CM | POA: Diagnosis not present

## 2021-01-22 ENCOUNTER — Other Ambulatory Visit: Payer: Self-pay

## 2021-01-22 ENCOUNTER — Ambulatory Visit (INDEPENDENT_AMBULATORY_CARE_PROVIDER_SITE_OTHER): Payer: Medicare Other | Admitting: Internal Medicine

## 2021-01-22 ENCOUNTER — Encounter: Payer: Self-pay | Admitting: Internal Medicine

## 2021-01-22 DIAGNOSIS — I420 Dilated cardiomyopathy: Secondary | ICD-10-CM

## 2021-01-22 DIAGNOSIS — I480 Paroxysmal atrial fibrillation: Secondary | ICD-10-CM

## 2021-01-22 DIAGNOSIS — M810 Age-related osteoporosis without current pathological fracture: Secondary | ICD-10-CM | POA: Diagnosis not present

## 2021-01-22 DIAGNOSIS — C61 Malignant neoplasm of prostate: Secondary | ICD-10-CM

## 2021-01-22 NOTE — Patient Instructions (Signed)
Your blood pressure is too low in the mornings,  this can make you dizzy and increase your chance of falling  I recommend that you reduce the morning dose of carvedilol to 1/2 tablet  Continue 1 tablet in the evening  If you increase the Vitamin D dose,  you should have your level checked after a month

## 2021-01-22 NOTE — Progress Notes (Signed)
Subjective:  Patient ID: Eric Hicks, male    DOB: 16-Mar-1927  Age: 87 y.o. MRN: [id number]  CC: Diagnoses of Nonischemic dilated cardiomyopathy ([place]), Paroxysmal atrial fibrillation ([place]), Age-related osteoporosis without current pathological fracture, and Prostate cancer ([place]) were pertinent to this visit.  HPI Eric Hicks presents for  Chief Complaint  Patient presents with   Follow-up    Follow up on bone density results   This visit occurred during the SARS-CoV-2 public health emergency.  Safety protocols were in place, including screening questions prior to the visit, additional usage of staff PPE, and extensive cleaning of exam room while observing appropriate contact time as indicated for disinfecting solutions.   87 yr old male with  COPD, dilated cardiomyopathy, CKD,  history of prostate CA still receiving Lupron, slowly enlarging RUL lung mass first seen in 2020 age related osteoporosis presents for follow up on DEXA scan .    Osteoporosis:  s/p 10 yrs of alendronate ostarted in 2009 taken intermittently , finally  stopped in 2018.  Hip T scores have been diagnostic since 2020.  Prolia has been recommended but deferred by patient .  Reviewed today ,   Tw score now -3.0 STILL DECLINES .  He walks  daily. Has fallen twice I he last 2 yerars.   no  recent fractures both were mechanical falls.    Ischemic cardiomyopathy. Managed with carvedilol and furosemide.  Weight daily at home,   following a low sodium diet.  Weight stable   Home BP readings have been checked daily and are ALL < 130/80.  Has occasional dizziness  when readings are <120/60, which is often .  \  Outpatient Medications Prior to Visit  Medication Sig Dispense Refill   AMBULATORY NON FORMULARY MEDICATION Joint Advantage Gold 2 tablets daily     atorvastatin (LIPITOR) 20 MG tablet Take 1 tablet (20 mg total) by mouth every other day. 45 tablet 3   calcium carbonate (OS-CAL) 600 MG TABS Take 600 mg by mouth daily  with breakfast.      carvedilol (COREG) 6.25 MG tablet Take 6.25 mg by mouth 2 (two) times daily.     dorzolamide-timolol (COSOPT) 22.3-6.8 MG/ML ophthalmic solution TAKE 1 DROP(S) IN RIGHT EYE 2 TIMES A DAY  5   doxazosin (CARDURA) 1 MG tablet TAKE 1 TABLET BY MOUTH EVERY DAY 90 tablet 1   furosemide (LASIX) 20 MG tablet TAKE 2 TABLETS BY MOUTH DAILY AS NEEDED 180 tablet 1   latanoprost (XALATAN) 0.005 % ophthalmic solution Place 1 drop into both eyes at bedtime.  5   leuprolide, 6 Month, (ELIGARD) 45 MG injection Inject 45 mg into the skin every 6 (six) months.     Vitamin D, Cholecalciferol, 25 MCG (1000 UT) CAPS Take 1 capsule by mouth daily.     WIXELA INHUB 250-50 MCG/ACT AEPB INHALE 1 PUFF INTO THE LUNGS TWICE DAILY. 180 each 3   No facility-administered medications prior to visit.    Review of Systems;  Patient denies headache, fevers, malaise, unintentional weight loss, skin rash, eye pain, sinus congestion and sinus pain, sore throat, dysphagia,  hemoptysis , cough, dyspnea, wheezing, chest pain, palpitations, orthopnea, edema, abdominal pain, nausea, melena, diarrhea, constipation, flank pain, dysuria, hematuria, urinary  Frequency, nocturia, numbness, tingling, seizures,  Focal weakness, Loss of consciousness,  Tremor, insomnia, depression, anxiety, and suicidal ideation.      Objective:  BP (!) 150/86 (BP Location: Left Arm, Patient Position: Sitting, Cuff Size: Normal)  Pulse 66   Temp (!) 95.8 F (35.4 C) (Temporal)   Ht 5\' 9"  (1.753 m)   Wt 171 lb 3.2 oz (77.7 kg)   SpO2 98%   BMI 25.28 kg/m   BP Readings from Last 3 Encounters:  01/22/21 (!) 150/86  01/13/21 (!) 159/87  10/06/20 (!) 146/79    Wt Readings from Last 3 Encounters:  01/22/21 171 lb 3.2 oz (77.7 kg)  01/13/21 171 lb 8 oz (77.8 kg)  10/06/20 176 lb (79.8 kg)    General appearance: alert, cooperative and appears stated age Ears: normal TM's and external ear canals both ears Throat: lips, mucosa,  and tongue normal; teeth and gums normal Neck: no adenopathy, no carotid bruit, supple, symmetrical, trachea midline and thyroid not enlarged, symmetric, no tenderness/mass/nodules Back: symmetric, no curvature. ROM normal. No CVA tenderness. Lungs: clear to auscultation bilaterally Heart: regular rate and rhythm, S1, S2 normal, no murmur, click, rub or gallop Abdomen: soft, non-tender; bowel sounds normal; no masses,  no organomegaly Pulses: 2+ and symmetric Skin: Skin color, texture, turgor normal. No rashes or lesions Lymph nodes: Cervical, supraclavicular, and axillary nodes normal.  No results found for: HGBA1C  Lab Results  Component Value Date   CREATININE 1.50 03/03/2020   CREATININE 1.69 (H) 08/30/2019   CREATININE 1.50 (H) 10/02/2018    Lab Results  Component Value Date   WBC 5.9 08/30/2019   HGB 13.2 08/30/2019   HCT 39.1 08/30/2019   PLT 187.0 08/30/2019   GLUCOSE 88 03/03/2020   CHOL 125 03/03/2020   TRIG 58.0 03/03/2020   HDL 70.60 03/03/2020   LDLCALC 43 03/03/2020   ALT 8 03/03/2020   AST 15 03/03/2020   NA 143 03/03/2020   K 4.5 03/03/2020   CL 107 03/03/2020   CREATININE 1.50 03/03/2020   BUN 30 (H) 03/03/2020   CO2 30 03/03/2020   TSH 1.69 06/14/2018   PSA 13.67 (H) 05/15/2014   MICROALBUR <0.7 06/20/2014    CT Chest Wo Contrast  Result Date: 01/10/2021 CLINICAL DATA:  Follow-up non-small cell lung cancer. EXAM: CT CHEST WITHOUT CONTRAST TECHNIQUE: Multidetector CT imaging of the chest was performed following the standard protocol without IV contrast. COMPARISON:  Multiple prior CT examinations. The most recent is 07/07/2020. Prior PET-CT 09/26/2019 FINDINGS: Cardiovascular: Stable mild cardiac enlargement involving all 4 chambers. No pericardial effusion. Stable tortuosity, ectasia and calcification the thoracic aorta. Stable fusiform aneurysmal dilatation of the ascending thoracic aorta which measures a maximum 4.7 cm. Mediastinum/Nodes: No  mediastinal or hilar mass or lymphadenopathy. Small scattered lymph nodes are stable. The esophagus is grossly normal. Lungs/Pleura: Stable appearing sub solid nodular lesion in the right upper lobe extending to the pleura. I think this is best measured on the coronal images and maximum dimensions are 26 x 15 mm on image 83/4. It has definitely increased since the prior CT scan from 2020 the central more solid portion measures approximately 9 mm and this previously measured 8 mm. On the prior study there was a new 5 mm nodule in the right middle lobe but this has resolved and was likely inflammatory. Stable scattered calcified granulomas. No new pulmonary lesions. Hazy ground-glass type opacity noted in both lung bases could suggest an inflammatory process or possible aspiration. Upper Abdomen: No significant upper abdominal findings. Stable aortic calcifications and bilateral renal calculi. No hepatic or adrenal gland lesions are identified without contrast. Musculoskeletal: No chest wall mass, supraclavicular or axillary adenopathy. No significant bony findings. IMPRESSION: 1. Persistent largely  subsolid nodular lesion in the right upper lobe. The central more solid portion measures 9 mm. No significant interval change when compared to the most recent chest CT from 07/07/2020. It has definitely increased in size since the prior CT scan from 2020. 2. Interval resolution of the 5 mm right middle lobe nodule seen on the prior CT scan. 3. No mediastinal or hilar mass or adenopathy. 4. Stable scattered calcified granulomas. 5. Hazy ground-glass type opacity in both lung bases could suggest an inflammatory process or possible aspiration. 6. Stable fusiform aneurysmal dilatation of the ascending thoracic aorta measuring a maximum of 4.7 cm. 7. Stable bilateral renal calculi. Aortic Atherosclerosis (ICD10-I70.0) and Emphysema (ICD10-J43.9). Electronically Signed   By: Marijo Sanes M.D.   On: 01/10/2021 17:19     Assessment & Plan:   Problem List Items Addressed This Visit       Unprioritized   Nonischemic dilated cardiomyopathy ([place])   Osteoporosis    Secondary to use of Lupron for management of prostate CA. T scores declined on alendronate.  He has been offered Prolia but has deferred given his age and life expectancy .  Reviewed dietary needs and exercise activities.  No history of fractures       Paroxysmal atrial fibrillation ([facility])    Chronic,  Continue carvedilol.  anticoagulation deferred by patient .       Prostate cancer ([place])    No intervention given age.  Managed with Lupron.   Urology   Following PSAs ,which have been <1 per patient .  Office notes not available for review from urologist          I provided  30 minutes of  face-to-face time during this encounter reviewing patient's current problems and past surgeries, labs and imaging studies, providing counseling on the above mentioned problems , and coordination  of care .  There are no discontinued medications.  Follow-up: No follow-ups on file.   Crecencio Mc, MD

## 2021-01-23 NOTE — Assessment & Plan Note (Signed)
Chronic,  Continue carvedilol.  anticoagulation deferred by patient .

## 2021-01-23 NOTE — Assessment & Plan Note (Signed)
No intervention given age.  Managed with Lupron.   Urology   Following PSAs ,which have been <1 per patient .  Office notes not available for review from urologist

## 2021-01-23 NOTE — Assessment & Plan Note (Addendum)
Secondary to use of Lupron for management of prostate CA. T scores declined on alendronate.  He has been offered Prolia but has deferred given his age and life expectancy .  Reviewed dietary needs and exercise activities.  No history of fractures

## 2021-02-04 DIAGNOSIS — L02519 Cutaneous abscess of unspecified hand: Secondary | ICD-10-CM | POA: Diagnosis not present

## 2021-02-04 DIAGNOSIS — L02512 Cutaneous abscess of left hand: Secondary | ICD-10-CM | POA: Diagnosis not present

## 2021-02-04 DIAGNOSIS — L03019 Cellulitis of unspecified finger: Secondary | ICD-10-CM | POA: Diagnosis not present

## 2021-02-04 DIAGNOSIS — L03012 Cellulitis of left finger: Secondary | ICD-10-CM | POA: Diagnosis not present

## 2021-03-02 DIAGNOSIS — H34812 Central retinal vein occlusion, left eye, with macular edema: Secondary | ICD-10-CM | POA: Diagnosis not present

## 2021-03-04 ENCOUNTER — Ambulatory Visit (INDEPENDENT_AMBULATORY_CARE_PROVIDER_SITE_OTHER): Payer: Medicare Other | Admitting: Internal Medicine

## 2021-03-04 ENCOUNTER — Encounter: Payer: Self-pay | Admitting: Internal Medicine

## 2021-03-04 ENCOUNTER — Other Ambulatory Visit: Payer: Self-pay

## 2021-03-04 VITALS — BP 150/94 | HR 70 | Temp 95.1°F | Ht 69.0 in | Wt 175.6 lb

## 2021-03-04 DIAGNOSIS — E785 Hyperlipidemia, unspecified: Secondary | ICD-10-CM | POA: Diagnosis not present

## 2021-03-04 DIAGNOSIS — N182 Chronic kidney disease, stage 2 (mild): Secondary | ICD-10-CM

## 2021-03-04 DIAGNOSIS — Z7189 Other specified counseling: Secondary | ICD-10-CM

## 2021-03-04 DIAGNOSIS — I10 Essential (primary) hypertension: Secondary | ICD-10-CM

## 2021-03-04 DIAGNOSIS — R918 Other nonspecific abnormal finding of lung field: Secondary | ICD-10-CM

## 2021-03-04 DIAGNOSIS — I700 Atherosclerosis of aorta: Secondary | ICD-10-CM

## 2021-03-04 DIAGNOSIS — C61 Malignant neoplasm of prostate: Secondary | ICD-10-CM | POA: Diagnosis not present

## 2021-03-04 DIAGNOSIS — Z8546 Personal history of malignant neoplasm of prostate: Secondary | ICD-10-CM

## 2021-03-04 DIAGNOSIS — I420 Dilated cardiomyopathy: Secondary | ICD-10-CM | POA: Diagnosis not present

## 2021-03-04 DIAGNOSIS — J432 Centrilobular emphysema: Secondary | ICD-10-CM

## 2021-03-04 LAB — LIPID PANEL
Cholesterol: 90 mg/dL (ref 0–200)
HDL: 50.1 mg/dL (ref >39.00)
LDL Cholesterol: 29 mg/dL (ref 0–99)
NonHDL: 40.24
Total CHOL/HDL Ratio: 2
Triglycerides: 56 mg/dL (ref 0.0–149.0)
VLDL: 11.2 mg/dL (ref 0.0–40.0)

## 2021-03-04 LAB — COMPREHENSIVE METABOLIC PANEL
ALT: 9 U/L (ref 0–53)
AST: 16 U/L (ref 0–37)
Albumin: 3.7 g/dL (ref 3.5–5.2)
Alkaline Phosphatase: 72 U/L (ref 39–117)
BUN: 23 mg/dL (ref 6–23)
CO2: 31 mEq/L (ref 19–32)
Calcium: 9 mg/dL (ref 8.4–10.5)
Chloride: 107 mEq/L (ref 96–112)
Creatinine, Ser: 1.41 mg/dL (ref 0.40–1.50)
GFR: 43.25 mL/min — ABNORMAL LOW (ref >60.00)
Glucose, Bld: 94 mg/dL (ref 70–99)
Potassium: 4.4 mEq/L (ref 3.5–5.1)
Sodium: 143 mEq/L (ref 135–145)
Total Bilirubin: 0.7 mg/dL (ref 0.2–1.2)
Total Protein: 5.5 g/dL — ABNORMAL LOW (ref 6.0–8.3)

## 2021-03-04 MED ORDER — ZOSTER VAC RECOMB ADJUVANTED 50 MCG/0.5ML IM SUSR: 0.5000 mL | Freq: Once | INTRAMUSCULAR | 1 refills | Status: AC

## 2021-03-04 NOTE — Assessment & Plan Note (Signed)
Surveillance CT every 6 months managed by Oncology asymptomatic.

## 2021-03-04 NOTE — Patient Instructions (Signed)
You are doing well!  Please discuss the forms with your daughter so she is aware of your wishes  I'll see you in 6 months

## 2021-03-04 NOTE — Assessment & Plan Note (Signed)
GFR has improved per patient report.  Seeing [facility] nephrologist once annually.

## 2021-03-04 NOTE — Assessment & Plan Note (Signed)
Asymptomatic with ADLS,  Some dyspnea with exertion

## 2021-03-04 NOTE — Assessment & Plan Note (Addendum)
Managed with hormone suppression.  Receiving Lupron

## 2021-03-04 NOTE — Assessment & Plan Note (Signed)
EF 30 to 35%.  Advised patient to continue to monitor wt daily and take  lasix dose for overnight weight gain of 2 lbs or weekly weight gain of 5 lbs.

## 2021-03-04 NOTE — Assessment & Plan Note (Signed)
Managed with atorvastatin, finally (after discussing with Paraschos)

## 2021-03-04 NOTE — Assessment & Plan Note (Signed)
He has requested DNR orders, which were signed today.  MOST form was also  Discussed , completed and given to patient . Encouraged to share with his daughter who has POA

## 2021-03-04 NOTE — Progress Notes (Signed)
Subjective:  Patient ID: Eric Hicks, male    DOB: 1926-05-12  Age: 87 y.o. MRN: [id number]  CC: The primary encounter diagnosis was Primary hypertension. Diagnoses of Hyperlipidemia, unspecified hyperlipidemia type, Centrilobular emphysema ([place]), Prostate cancer ([place]), Mass of upper lobe of right lung, CKD (chronic kidney disease), stage II, History of prostate cancer, Aortic arch atherosclerosis ([place]), Nonischemic dilated cardiomyopathy ([place]), and Counseling regarding end of life decision making were also pertinent to this visit.  HPI Eric Hicks presents for  follow up on hypertension  with  CKD stage 3  and CHF with dilated cardiomyopathy  Chief Complaint  Patient presents with   Follow-up    6 month follow up    This visit occurred during the SARS-CoV-2 public health emergency.  Safety protocols were in place, including screening questions prior to the visit, additional usage of staff PPE, and extensive cleaning of exam room while observing appropriate contact time as indicated for disinfecting solutions.   87 year old gentleman with recent 32-monthhistory of exertional dyspnea, peripheral edema, and orthopnea, consistent with acute systolic congestive heart failure. 2D echocardiogram revealed moderate reduced left ventricular function, with LVEF of 30 to 35%.    Weight has been checked daily , takes furosemide prn weight gain of 2 lbs and staying at home.    PAF: CHAS+DS score 4l   continues to defer  anticoagulation advised by AMiquel Dunn HTN:  home readings have been < 130/80 consisently  based on log brought from home.  Occasional readings < 1932systolic on a new [employer] machine   CKD:  avoiding NSAIDS,     Personally very happy in companionship with Ouid [place] for the past 4 years.  Plat[place]  Wears a hearing aide.   Awaiting chest CT in march to follow up on presumed lung CA  Reviewed findings of prior CT scan today..  Patient is tolerating high potency statin therapy     Saw urology for Lupron  injections.  PSA still < 0.01  Osteoporosis:  not taking  alendronate, continues to defer  therapy with Prolia.   No falls since December 30.  Still using a ladder in the house to painty, change light bulbs.   Getting regular eye exams  Outpatient Medications Prior to Visit  Medication Sig Dispense Refill   AMBULATORY NON FORMULARY MEDICATION Joint Advantage Gold 2 tablets daily     atorvastatin (LIPITOR) 20 MG tablet Take 1 tablet (20 mg total) by mouth every other day. 45 tablet 3   calcium carbonate (OS-CAL) 600 MG TABS Take 600 mg by mouth daily with breakfast.      carvedilol (COREG) 6.25 MG tablet Take 6.25 mg by mouth 2 (two) times daily.     dorzolamide-timolol (COSOPT) 22.3-6.8 MG/ML ophthalmic solution TAKE 1 DROP(S) IN RIGHT EYE 2 TIMES A DAY  5   doxazosin (CARDURA) 1 MG tablet TAKE 1 TABLET BY MOUTH EVERY DAY 90 tablet 1   fluticasone-salmeterol (ADVAIR) 100-50 MCG/ACT AEPB Inhale 1 puff into the lungs 2 (two) times daily.     furosemide (LASIX) 20 MG tablet TAKE 2 TABLETS BY MOUTH DAILY AS NEEDED 180 tablet 1   latanoprost (XALATAN) 0.005 % ophthalmic solution Place 1 drop into both eyes at bedtime.  5   leuprolide, 6 Month, (ELIGARD) 45 MG injection Inject 45 mg into the skin every 6 (six) months.     Vitamin D, Cholecalciferol, 25 MCG (1000 UT) CAPS Take 1 capsule by mouth daily.  WIXELA INHUB 250-50 MCG/ACT AEPB INHALE 1 PUFF INTO THE LUNGS TWICE DAILY. (Patient not taking: Reported on 03/04/2021) 180 each 3   No facility-administered medications prior to visit.    Review of Systems;  Patient denies headache, fevers, malaise, unintentional weight loss, skin rash, eye pain, sinus congestion and sinus pain, sore throat, dysphagia,  hemoptysis , cough, dyspnea, wheezing, chest pain, palpitations, orthopnea, edema, abdominal pain, nausea, melena, diarrhea, constipation, flank pain, dysuria, hematuria, urinary  Frequency, nocturia, numbness,  tingling, seizures,  Focal weakness, Loss of consciousness,  Tremor, insomnia, depression, anxiety, and suicidal ideation.      Objective:  BP (!) 150/94 (BP Location: Left Arm, Patient Position: Sitting, Cuff Size: Normal)   Pulse 70   Temp (!) 95.1 F (35.1 C) (Temporal)   Ht 5' 9"  (1.753 m)   Wt 175 lb 9.6 oz (79.7 kg)   SpO2 99%   BMI 25.93 kg/m   BP Readings from Last 3 Encounters:  03/04/21 (!) 150/94  01/22/21 (!) 150/86  01/13/21 (!) 159/87    Wt Readings from Last 3 Encounters:  03/04/21 175 lb 9.6 oz (79.7 kg)  01/22/21 171 lb 3.2 oz (77.7 kg)  01/13/21 171 lb 8 oz (77.8 kg)    General appearance: alert, cooperative and appears stated age Ears: normal TM's and external ear canals both ears Throat: lips, mucosa, and tongue normal; teeth and gums normal Neck: no adenopathy, no carotid bruit, supple, symmetrical, trachea midline and thyroid not enlarged, symmetric, no tenderness/mass/nodules Back: symmetric, no curvature. ROM normal. No CVA tenderness. Lungs: clear to auscultation bilaterally Heart: regular rate and rhythm, S1, S2 normal, no murmur, click, rub or gallop Abdomen: soft, non-tender; bowel sounds normal; no masses,  no organomegaly Pulses: 2+ and symmetric Skin: Skin color, texture, turgor normal. No rashes or lesions Lymph nodes: Cervical, supraclavicular, and axillary nodes normal.  No results found for: HGBA1C  Lab Results  Component Value Date   CREATININE 1.50 03/03/2020   CREATININE 1.69 (H) 08/30/2019   CREATININE 1.50 (H) 10/02/2018    Lab Results  Component Value Date   WBC 5.9 08/30/2019   HGB 13.2 08/30/2019   HCT 39.1 08/30/2019   PLT 187.0 08/30/2019   GLUCOSE 88 03/03/2020   CHOL 125 03/03/2020   TRIG 58.0 03/03/2020   HDL 70.60 03/03/2020   LDLCALC 43 03/03/2020   ALT 8 03/03/2020   AST 15 03/03/2020   NA 143 03/03/2020   K 4.5 03/03/2020   CL 107 03/03/2020   CREATININE 1.50 03/03/2020   BUN 30 (H) 03/03/2020   CO2  30 03/03/2020   TSH 1.69 06/14/2018   PSA 13.67 (H) 05/15/2014   MICROALBUR <0.7 06/20/2014    CT Chest Wo Contrast  Result Date: 01/10/2021 CLINICAL DATA:  Follow-up non-small cell lung cancer. EXAM: CT CHEST WITHOUT CONTRAST TECHNIQUE: Multidetector CT imaging of the chest was performed following the standard protocol without IV contrast. COMPARISON:  Multiple prior CT examinations. The most recent is 07/07/2020. Prior PET-CT 09/26/2019 FINDINGS: Cardiovascular: Stable mild cardiac enlargement involving all 4 chambers. No pericardial effusion. Stable tortuosity, ectasia and calcification the thoracic aorta. Stable fusiform aneurysmal dilatation of the ascending thoracic aorta which measures a maximum 4.7 cm. Mediastinum/Nodes: No mediastinal or hilar mass or lymphadenopathy. Small scattered lymph nodes are stable. The esophagus is grossly normal. Lungs/Pleura: Stable appearing sub solid nodular lesion in the right upper lobe extending to the pleura. I think this is best measured on the coronal images and maximum dimensions are  26 x 15 mm on image 83/4. It has definitely increased since the prior CT scan from 2020 the central more solid portion measures approximately 9 mm and this previously measured 8 mm. On the prior study there was a new 5 mm nodule in the right middle lobe but this has resolved and was likely inflammatory. Stable scattered calcified granulomas. No new pulmonary lesions. Hazy ground-glass type opacity noted in both lung bases could suggest an inflammatory process or possible aspiration. Upper Abdomen: No significant upper abdominal findings. Stable aortic calcifications and bilateral renal calculi. No hepatic or adrenal gland lesions are identified without contrast. Musculoskeletal: No chest wall mass, supraclavicular or axillary adenopathy. No significant bony findings. IMPRESSION: 1. Persistent largely subsolid nodular lesion in the right upper lobe. The central more solid portion  measures 9 mm. No significant interval change when compared to the most recent chest CT from 07/07/2020. It has definitely increased in size since the prior CT scan from 2020. 2. Interval resolution of the 5 mm right middle lobe nodule seen on the prior CT scan. 3. No mediastinal or hilar mass or adenopathy. 4. Stable scattered calcified granulomas. 5. Hazy ground-glass type opacity in both lung bases could suggest an inflammatory process or possible aspiration. 6. Stable fusiform aneurysmal dilatation of the ascending thoracic aorta measuring a maximum of 4.7 cm. 7. Stable bilateral renal calculi. Aortic Atherosclerosis (ICD10-I70.0) and Emphysema (ICD10-J43.9). Electronically Signed   By: Marijo Sanes M.D.   On: 01/10/2021 17:19    Assessment & Plan:   Problem List Items Addressed This Visit     COPD (chronic obstructive pulmonary disease) with emphysema ([place])    Asymptomatic with ADLS,  Some dyspnea with exertion       Relevant Medications   fluticasone-salmeterol (ADVAIR) 100-50 MCG/ACT AEPB   Other Relevant Orders   DNR (Do Not Resuscitate)   CKD (chronic kidney disease), stage II    GFR has improved per patient report.  Seeing [facility] nephrologist once annually.       Hypertension - Primary   Relevant Orders   Comp Met (CMET)   Prostate cancer ([place])    Managed with hormone suppression.  Receiving Lupron      Relevant Orders   DNR (Do Not Resuscitate)   Nonischemic dilated cardiomyopathy ([facility])    EF 30 to 35%.  Advised patient to continue to monitor wt daily and take  lasix dose for overnight weight gain of 2 lbs or weekly weight gain of 5 lbs.       Aortic arch atherosclerosis ([place])    Managed with atorvastatin, finally (after discussing with Paraschos)      Mass of upper lobe of right lung    Surveillance CT every 6 months managed by Oncology asymptomatic.       Relevant Orders   DNR (Do Not Resuscitate)   Counseling regarding end of life decision making    He has  requested DNR orders, which were signed today.  MOST form was also  Discussed , completed and given to patient . Encouraged to share with his daughter who has POA      Other Visit Diagnoses     Hyperlipidemia, unspecified hyperlipidemia type       Relevant Orders   Lipid Profile       I am having Eric Hicks "[place]ay" start on Zoster Vaccine Adjuvanted. I am also having him maintain his calcium carbonate, AMBULATORY NON FORMULARY MEDICATION, dorzolamide-timolol, leuprolide (6 Month), latanoprost, carvedilol, Vitamin D (Cholecalciferol),  atorvastatin, doxazosin, Wixela Inhub, furosemide, and fluticasone-salmeterol.  Meds ordered this encounter  Medications   Zoster Vaccine Adjuvanted [facility]) injection    Sig: Inject 0.5 mLs into the muscle once for 1 dose.    Dispense:  1 each    Refill:  1    There are no discontinued medications.  Follow-up: No follow-ups on file.   Crecencio Mc, MD

## 2021-03-13 DIAGNOSIS — H6982 Other specified disorders of Eustachian tube, left ear: Secondary | ICD-10-CM | POA: Diagnosis not present

## 2021-04-02 ENCOUNTER — Other Ambulatory Visit: Payer: Self-pay | Admitting: Internal Medicine

## 2021-04-13 DIAGNOSIS — H34812 Central retinal vein occlusion, left eye, with macular edema: Secondary | ICD-10-CM | POA: Diagnosis not present

## 2021-04-13 IMAGING — PT NM PET TUM IMG INITIAL (PI) SKULL BASE T - THIGH
1 of 9 series · 1 of 25 positions shown · non-contrast
Comparison: CT chest 09/13/2019

CLINICAL DATA: Initial treatment strategy for right apical lung
nodule.

EXAM:
NUCLEAR MEDICINE PET SKULL BASE TO THIGH
TECHNIQUE: 8.8 mCi F-18 FDG was injected intravenously. Full-ring PET imaging
was performed from the skull base to thigh after the radiotracer. CT
data was obtained and used for attenuation correction and anatomic
localization.
Fasting blood glucose: 82 mg/dl

[Series 3: ct wb 5.0 b30f · axial · 5.0mm · 0.98mm/px · 1 of 329 slices shown]
[im 329/329  brain]
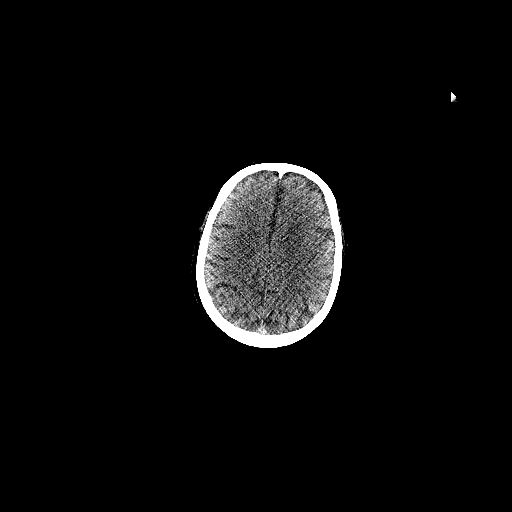

[1 of 25 positions shown; findings below may reference images not displayed]

FINDINGS: Mediastinal blood pool activity: SUV max

Liver activity: SUV max NA

NECK: Mildly asymmetric uptake in the left tonsillar region with SUV
max = 3.8. No hypermetabolic cervical lymphadenopathy.

Incidental CT findings: none

CHEST: Low level FDG accumulation is identified in the spiculated
irregular right upper lobe pulmonary nodule identified is
progressive on the recent chest CT. SUV max = 2.1.

Focal hypermetabolism in the right hilum demonstrates SUV max = 4.8.
No discrete lymph node identified on this noncontrast study or
previous chest CT performed without intravenous contrast. Other foci
of more modest FDG uptake also evident in the right hilar region. No
evidence for hypermetabolic mediastinal or left hilar
lymphadenopathy although there is a small focus of FDG accumulation
in the left hilum without underlying CT correlate.

Incidental CT findings: Heart is enlarged. Coronary artery
calcification is evident. Ascending thoracic aorta measures up to
4.7 cm diameter today.

ABDOMEN/PELVIS: No abnormal hypermetabolic activity within the
liver, pancreas, adrenal glands, or spleen. No hypermetabolic lymph
nodes in the abdomen or pelvis.

Incidental CT findings: Nonobstructing stones noted in both kidneys.
There is abdominal aortic atherosclerosis without aneurysm.
Diverticular changes noted left colon without diverticulitis.
Prostate gland is enlarged.

SKELETON: No focal hypermetabolic activity to suggest skeletal
metastasis.

Incidental CT findings: No worrisome lytic or sclerotic osseous
abnormality.
IMPRESSION: 1. The enlarging spiculated right upper lobe pulmonary lesion shows
low level FDG accumulation. Detectable FDG accumulation in a lesion
of this size/configuration does raise concern for neoplasm.
2. Focal hypermetabolism in the right hilum is more prominent than
the uptake in the right upper lobe lesion, but raises concern for
metastatic disease. Renal function permitting, CT chest with
contrast would likely prove helpful to further evaluate as no
underlying discrete lymph nodes are evident on this noncontrast
exam.
3. Asymmetric mild FDG accumulation in the left tonsillar region.
Direct visualization may be warranted to exclude mucosal lesion.
4. 4.7 cm ascending thoracic aortic aneurysm. Recommend semi-annual
imaging followup by CTA or MRA and referral to cardiothoracic
surgery if not already obtained. This recommendation follows 1232
ACCF/AHA/AATS/ACR/ASA/SCA/BORDA/QUIRIJN/TIGER/KENSON Guidelines for the
Diagnosis and Management of Patients With Thoracic Aortic Disease.
Circulation. 1232; 121: [id number]. Aortic aneurysm NOS ([id number].T)
5. Nephrolithiasis, nonobstructing.

## 2021-04-14 DIAGNOSIS — R918 Other nonspecific abnormal finding of lung field: Secondary | ICD-10-CM | POA: Diagnosis not present

## 2021-04-14 DIAGNOSIS — I482 Chronic atrial fibrillation, unspecified: Secondary | ICD-10-CM | POA: Diagnosis not present

## 2021-04-14 DIAGNOSIS — I420 Dilated cardiomyopathy: Secondary | ICD-10-CM | POA: Diagnosis not present

## 2021-04-14 DIAGNOSIS — I480 Paroxysmal atrial fibrillation: Secondary | ICD-10-CM | POA: Diagnosis not present

## 2021-04-14 DIAGNOSIS — R0602 Shortness of breath: Secondary | ICD-10-CM | POA: Diagnosis not present

## 2021-04-14 DIAGNOSIS — I7121 Aneurysm of the ascending aorta, without rupture: Secondary | ICD-10-CM | POA: Diagnosis not present

## 2021-04-14 DIAGNOSIS — J439 Emphysema, unspecified: Secondary | ICD-10-CM | POA: Diagnosis not present

## 2021-04-14 DIAGNOSIS — I10 Essential (primary) hypertension: Secondary | ICD-10-CM | POA: Diagnosis not present

## 2021-05-15 DIAGNOSIS — H401112 Primary open-angle glaucoma, right eye, moderate stage: Secondary | ICD-10-CM | POA: Diagnosis not present

## 2021-05-18 DIAGNOSIS — H34812 Central retinal vein occlusion, left eye, with macular edema: Secondary | ICD-10-CM | POA: Diagnosis not present

## 2021-05-22 DIAGNOSIS — H401112 Primary open-angle glaucoma, right eye, moderate stage: Secondary | ICD-10-CM | POA: Diagnosis not present

## 2021-06-19 ENCOUNTER — Other Ambulatory Visit: Payer: Self-pay | Admitting: Internal Medicine

## 2021-06-19 DIAGNOSIS — I509 Heart failure, unspecified: Secondary | ICD-10-CM

## 2021-06-29 DIAGNOSIS — H34812 Central retinal vein occlusion, left eye, with macular edema: Secondary | ICD-10-CM | POA: Diagnosis not present

## 2021-07-13 ENCOUNTER — Other Ambulatory Visit: Payer: Self-pay

## 2021-07-13 ENCOUNTER — Ambulatory Visit
Admission: RE | Admit: 2021-07-13 | Discharge: 2021-07-13 | Disposition: A | Discharge status: Home / Self Care | Payer: Medicare Other | Source: Ambulatory Visit | Attending: Oncology | Admitting: Oncology

## 2021-07-13 DIAGNOSIS — I7121 Aneurysm of the ascending aorta, without rupture: Secondary | ICD-10-CM | POA: Diagnosis not present

## 2021-07-13 DIAGNOSIS — J90 Pleural effusion, not elsewhere classified: Secondary | ICD-10-CM | POA: Diagnosis not present

## 2021-07-13 DIAGNOSIS — R918 Other nonspecific abnormal finding of lung field: Secondary | ICD-10-CM | POA: Insufficient documentation

## 2021-07-13 DIAGNOSIS — I700 Atherosclerosis of aorta: Secondary | ICD-10-CM | POA: Diagnosis not present

## 2021-07-13 DIAGNOSIS — C3490 Malignant neoplasm of unspecified part of unspecified bronchus or lung: Secondary | ICD-10-CM | POA: Diagnosis not present

## 2021-07-13 LAB — POCT I-STAT CREATININE: Creatinine, Ser: 1.6 mg/dL — ABNORMAL HIGH (ref 0.61–1.24)

## 2021-07-13 MED ORDER — IOHEXOL 300 MG/ML  SOLN
50.0000 mL | Freq: Once | INTRAMUSCULAR | Status: AC | PRN
  Administered 2021-07-13: 50 mL via INTRAVENOUS

## 2021-07-17 ENCOUNTER — Other Ambulatory Visit: Payer: Self-pay

## 2021-07-17 ENCOUNTER — Encounter: Payer: Self-pay | Admitting: Medical Oncology

## 2021-07-17 ENCOUNTER — Inpatient Hospital Stay: Payer: Medicare Other | Attending: Oncology | Admitting: Medical Oncology

## 2021-07-17 VITALS — BP 160/95 | HR 53 | Temp 96.5°F | Resp 16 | Ht 69.0 in | Wt 174.8 lb

## 2021-07-17 DIAGNOSIS — I10 Essential (primary) hypertension: Secondary | ICD-10-CM | POA: Insufficient documentation

## 2021-07-17 DIAGNOSIS — Z79899 Other long term (current) drug therapy: Secondary | ICD-10-CM | POA: Diagnosis not present

## 2021-07-17 DIAGNOSIS — C250 Malignant neoplasm of head of pancreas: Secondary | ICD-10-CM | POA: Insufficient documentation

## 2021-07-17 DIAGNOSIS — R918 Other nonspecific abnormal finding of lung field: Secondary | ICD-10-CM | POA: Diagnosis not present

## 2021-07-17 NOTE — Progress Notes (Signed)
?[facility]  ?[phone] [phone] ? ?ID: [name] OB: [birth year]  MR#: [id number]  [facility]#:[id number] ? ?Patient Care Team: ?Crecencio Mc, MD as PCP - General (Internal Medicine) ?Telford Nab, RN as Sales executive ? ?CHIEF COMPLAINT: Right upper lobe lung mass. ? ?INTERVAL HISTORY: Patient returns to clinic today for further evaluation and discussion of his imaging results. This was performed at 07/13/2021. He brings in his daughter to review results. He continues to feel well and remains asymptomatic without SOB, hemoptysis, chest pain.  He remains active.  He has no neurologic complaints.  He denies any recent fevers or illnesses.  He has a good appetite and denies weight loss.  He has no chest pain, shortness of breath, cough, or hemoptysis.  He denies any nausea, vomiting, constipation, or diarrhea.  He has no urinary complaints.  Patient feels at his baseline and offers no specific complaints today.  ? ?REVIEW OF SYSTEMS:   ?Review of Systems  ?Constitutional: Negative.  Negative for fever, malaise/fatigue and weight loss.  ?Respiratory: Negative.  Negative for cough, hemoptysis and shortness of breath.   ?Cardiovascular: Negative.  Negative for chest pain and leg swelling.  ?Gastrointestinal: Negative.  Negative for abdominal pain.  ?Genitourinary: Negative.  Negative for dysuria.  ?Musculoskeletal: Negative.  Negative for back pain.  ?Skin: Negative.  Negative for rash.  ?Neurological: Negative.  Negative for dizziness, focal weakness, weakness and headaches.  ?Psychiatric/Behavioral: Negative.  The patient is not nervous/anxious.   ? ?As per HPI. Otherwise, a complete review of systems is negative. ? ?PAST MEDICAL HISTORY: ?Past Medical History:  ?Diagnosis Date  ? BPH (benign prostatic hyperplasia)   ? managed by [facility]len[facility] Heckle  ? COPD (chronic obstructive pulmonary disease) ([place])   ? Elevated PSA, between 10 and less than 20 ng/ml   ? Olena Heckle, watchful  waiting  ? Hypertension   ? Osteoporosis   ? by DEXA  ? ? ?PAST SURGICAL HISTORY: ?Past Surgical History:  ?Procedure Laterality Date  ? TYMPANOSTOMY TUBE PLACEMENT  2017  ? ? ?FAMILY HISTORY: ?Family History  ?Problem Relation Age of Onset  ? Diabetes Mother   ? Hypertension Father   ? Cancer Brother 70  ?     multiple myeloma  ? ? ?ADVANCED DIRECTIVES (Y/N):  N ? ?HEALTH MAINTENANCE: ?Social History  ? ?Tobacco Use  ? Smoking status: Former  ? Smokeless tobacco: Never  ?Vaping Use  ? Vaping Use: Never used  ?Substance Use Topics  ? Alcohol use: No  ? ? ? Colonoscopy: ? PAP: ? Bone density: ? Lipid panel: ? ?Allergies  ?Allergen Reactions  ? Tylenol [Acetaminophen] Rash  ? ? ?Current Outpatient Medications  ?Medication Sig Dispense Refill  ? Aflibercept (EYLEA IO) Inject into the eye.    ? AMBULATORY NON FORMULARY MEDICATION Joint Advantage Gold ?2 tablets daily    ? atorvastatin (LIPITOR) 20 MG tablet Take 1 tablet (20 mg total) by mouth every other day. 45 tablet 3  ? calcium carbonate (OS-CAL) 600 MG TABS Take 600 mg by mouth daily with breakfast.     ? carvedilol (COREG) 6.25 MG tablet Take 6.25 mg by mouth 2 (two) times daily.    ? dorzolamide-timolol (COSOPT) 22.3-6.8 MG/ML ophthalmic solution TAKE 1 DROP(S) IN RIGHT EYE 2 TIMES A DAY  5  ? doxazosin (CARDURA) 1 MG tablet TAKE 1 TABLET BY MOUTH EVERY DAY 90 tablet 1  ? fluticasone-salmeterol (ADVAIR) 100-50 MCG/ACT AEPB Inhale 1 puff into the lungs 2 (  two) times daily.    ? furosemide (LASIX) 20 MG tablet TAKE 2 TABLETS BY MOUTH DAILY AS NEEDED 180 tablet 1  ? latanoprost (XALATAN) 0.005 % ophthalmic solution Place 1 drop into both eyes at bedtime.  5  ? leuprolide, 6 Month, (ELIGARD) 45 MG injection Inject 45 mg into the skin every 6 (six) months.    ? Vitamin D, Cholecalciferol, 25 MCG (1000 UT) CAPS Take 1 capsule by mouth daily.    ? WIXELA INHUB 250-50 MCG/ACT AEPB INHALE 1 PUFF INTO THE LUNGS TWICE DAILY. 180 each 3  ? ?No current facility-administered  medications for this visit.  ? ? ?OBJECTIVE: ?Vitals:  ? 07/17/21 1000  ?BP: (!) 160/95  ?Pulse: (!) 53  ?Resp: 16  ?Temp: (!) 96.5 ?F (35.8 ?C)  ?   Body mass index is 25.81 kg/m?Marland Kitchen    ECOG FS:0 - Asymptomatic ? ? ?General: Well-developed, well-nourished, no acute distress. ?Eyes: Pink conjunctiva, anicteric sclera. ?HEENT: Normocephalic, moist mucous membranes. ?Lungs: No audible wheezing or coughing. ?Heart: Regular rate and rhythm. ?Abdomen: Soft, nontender, no obvious distention. ?Musculoskeletal: No edema, cyanosis, or clubbing. ?Neuro: Alert, answering all questions appropriately. Cranial nerves grossly intact. ?Skin: No rashes or petechiae noted. ?Psych: Normal affect. ? ? ?LAB RESULTS: ? ?Lab Results  ?Component Value Date  ? NA 143 03/04/2021  ? K 4.4 03/04/2021  ? CL 107 03/04/2021  ? CO2 31 03/04/2021  ? GLUCOSE 94 03/04/2021  ? BUN 23 03/04/2021  ? CREATININE 1.60 (H) 07/13/2021  ? CALCIUM 9.0 03/04/2021  ? PROT 5.5 (L) 03/04/2021  ? ALBUMIN 3.7 03/04/2021  ? AST 16 03/04/2021  ? ALT 9 03/04/2021  ? ALKPHOS 72 03/04/2021  ? BILITOT 0.7 03/04/2021  ? GFRNONAA 35 (L) 11/12/2011  ? GFRAA 40 (L) 11/12/2011  ? ? ?Lab Results  ?Component Value Date  ? WBC 5.9 08/30/2019  ? NEUTROABS 3.5 08/30/2019  ? HGB 13.2 08/30/2019  ? HCT 39.1 08/30/2019  ? MCV 95.2 08/30/2019  ? PLT 187.0 08/30/2019  ? ? ? ?STUDIES: ?CT CHEST W CONTRAST ? ?Result Date: 07/14/2021 ?CLINICAL DATA:  Restaging non-small cell lung cancer. EXAM: CT CHEST WITH CONTRAST TECHNIQUE: Multidetector CT imaging of the chest was performed during intravenous contrast administration. RADIATION DOSE REDUCTION: This exam was performed according to the departmental dose-optimization program which includes automated exposure control, adjustment of the mA and/or kV according to patient size and/or use of iterative reconstruction technique. CONTRAST:  23m OMNIPAQUE IOHEXOL 300 MG/ML  SOLN COMPARISON:  01/09/2021 FINDINGS: Cardiovascular: Cardiac enlargement.  Aneurysmal dilatation of the ascending thoracic aorta is stable at 4.7 cm, image 79/2. Aortic atherosclerosis. Coronary artery calcifications. Mediastinum/Nodes: No enlarged axillary, supraclavicular, mediastinal, or hilar lymph nodes. The thyroid gland, trachea, and esophagus demonstrate no significant findings. Lungs/Pleura: Small bilateral pleural effusions noted. Bilateral lower lobe ground-glass attenuation and mild interlobular septal thickening is noted. -subsolid index lesion within the right upper lobe measures 2.8 x 2.3 cm, image 70/5. This is compared with 2.6 x 1.6 cm previously. Previous solid component measures 0.8 cm, image 69/2. Unchanged from previous exam. New separate solid component is identified along the lateral margin of this lesion measuring 0.7 cm, image 68/5. -resolution of previous 5 mm right middle lobe lung nodule, likely postinflammatory or infectious. Scar in volume loss is again seen within the lingula and right middle lobe. Upper Abdomen: Normal appearance of the adrenal glands. Bilateral kidney stones are again noted. The largest is in the upper pole of right kidney measuring  0.9 cm. Aortic atherosclerotic calcifications. Musculoskeletal: No acute or significant osseous findings. Unchanged appearance of L2 compression deformity. Remote sternal fracture is again noted and appears similar. IMPRESSION: 1. Slight increase in size of part solid index lesion within the right upper lobe. There is a new solid component along the lateral margin of this lesion measuring 7 mm. The separate, previously referenced solid component associated with this lesion is stable. As mentioned previously these findings are worrisome for either low-grade pulmonary adenocarcinoma. 2. Resolution of previous 5 mm right middle lobe lung nodule, likely postinflammatory or infectious. 3. Mild congestive heart failure with small bilateral pleural effusions and bilateral areas of ground-glass attenuation and lower  lobe interlobular septal thickening. 4. Stable L2 compression deformity. 5. Aortic Atherosclerosis (ICD10-I70.0). 6. Stable 4.7 cm ascending thoracic aortic aneurysm. Ascending thoracic aortic aneurysm. Re

## 2021-07-17 NOTE — Progress Notes (Signed)
Patient denies new problems/concerns today.   °

## 2021-07-22 ENCOUNTER — Ambulatory Visit
Admission: RE | Admit: 2021-07-22 | Discharge: 2021-07-22 | Disposition: A | Discharge status: Home / Self Care | Payer: Medicare Other | Source: Ambulatory Visit | Attending: Radiation Oncology | Admitting: Radiation Oncology

## 2021-07-22 VITALS — BP 156/85 | HR 65 | Resp 16 | Ht 69.0 in | Wt 175.7 lb

## 2021-07-22 DIAGNOSIS — R918 Other nonspecific abnormal finding of lung field: Secondary | ICD-10-CM | POA: Insufficient documentation

## 2021-07-22 DIAGNOSIS — J449 Chronic obstructive pulmonary disease, unspecified: Secondary | ICD-10-CM | POA: Insufficient documentation

## 2021-07-22 DIAGNOSIS — Z79899 Other long term (current) drug therapy: Secondary | ICD-10-CM | POA: Insufficient documentation

## 2021-07-22 DIAGNOSIS — N400 Enlarged prostate without lower urinary tract symptoms: Secondary | ICD-10-CM | POA: Insufficient documentation

## 2021-07-22 DIAGNOSIS — Z87891 Personal history of nicotine dependence: Secondary | ICD-10-CM | POA: Insufficient documentation

## 2021-07-22 DIAGNOSIS — M810 Age-related osteoporosis without current pathological fracture: Secondary | ICD-10-CM | POA: Diagnosis not present

## 2021-07-22 DIAGNOSIS — R911 Solitary pulmonary nodule: Secondary | ICD-10-CM

## 2021-07-22 DIAGNOSIS — I110 Hypertensive heart disease with heart failure: Secondary | ICD-10-CM | POA: Diagnosis not present

## 2021-07-22 DIAGNOSIS — I509 Heart failure, unspecified: Secondary | ICD-10-CM | POA: Insufficient documentation

## 2021-07-22 DIAGNOSIS — C3411 Malignant neoplasm of upper lobe, right bronchus or lung: Secondary | ICD-10-CM | POA: Diagnosis not present

## 2021-07-22 DIAGNOSIS — I7121 Aneurysm of the ascending aorta, without rupture: Secondary | ICD-10-CM | POA: Diagnosis not present

## 2021-07-22 NOTE — Consult Note (Signed)
?NEW PATIENT EVALUATION ? ?Name: Eric Hicks  ?MRN: [id number]  ?Date:   07/22/2021     ?DOB: 23-Mar-1927 ? ? ?This 88 y.o. male patient presents to the clinic for initial evaluation of stage I non-small cell lung cancer of the right upper lobe. ? ?REFERRING PHYSICIAN: Crecencio Mc, MD ? ?CHIEF COMPLAINT:  ?Chief Complaint  ?Patient presents with  ? Lung Cancer  ?  Initial consultation  ? ? ?DIAGNOSIS: The encounter diagnosis was Lung nodule. ?  ?PREVIOUS INVESTIGATIONS:  ?CT scan and PET CT scans reviewed ?Labs reviewed ?Clinical notes reviewed ? ?HPI: Patient is a 88 year old male who presented with an abnormal suspicious nodule in the right upper lobe back in 2020.  This was an incidental finding at the time of work-up for a fall.  Over time he had serial CT scan showing progression of this lesion with most recent scans this month showing spiculated right upper lobe need lesion slightly increased in size highly suspicious for low-grade adenocarcinoma.  He also had a PET CT scan showing low-level activity consistent with a well differentiated adenocarcinoma.  Patient is fairly asymptomatic specifically denies cough hemoptysis or chest tightness.  He does have a ascending aortic aneurysm which is stable over time and evidence of congestive heart failure.  Now referred to radiation oncology for consideration of SBRT. ? ?PLANNED TREATMENT REGIMEN: SBRT ? ?PAST MEDICAL HISTORY:  has a past medical history of BPH (benign prostatic hyperplasia), COPD (chronic obstructive pulmonary disease) ([place]), Elevated PSA, between 10 and less than 20 ng/ml, Hypertension, and Osteoporosis.   ? ?PAST SURGICAL HISTORY:  ?Past Surgical History:  ?Procedure Laterality Date  ? TYMPANOSTOMY TUBE PLACEMENT  2017  ? ? ?FAMILY HISTORY: family history includes Cancer (age of onset: 27) in his brother; Diabetes in his mother; Hypertension in his father. ? ?SOCIAL HISTORY:  reports that he has quit smoking. He has never used smokeless tobacco.  He reports that he does not drink alcohol. ? ?ALLERGIES: Tylenol [acetaminophen] ? ?MEDICATIONS:  ?Current Outpatient Medications  ?Medication Sig Dispense Refill  ? Aflibercept (EYLEA IO) Inject into the eye.    ? AMBULATORY NON FORMULARY MEDICATION Joint Advantage Gold ?2 tablets daily    ? atorvastatin (LIPITOR) 20 MG tablet Take 1 tablet (20 mg total) by mouth every other day. 45 tablet 3  ? calcium carbonate (OS-CAL) 600 MG TABS Take 600 mg by mouth daily with breakfast.     ? carvedilol (COREG) 6.25 MG tablet Take 6.25 mg by mouth 2 (two) times daily.    ? dorzolamide-timolol (COSOPT) 22.3-6.8 MG/ML ophthalmic solution TAKE 1 DROP(S) IN RIGHT EYE 2 TIMES A DAY  5  ? doxazosin (CARDURA) 1 MG tablet TAKE 1 TABLET BY MOUTH EVERY DAY 90 tablet 1  ? fluticasone-salmeterol (ADVAIR) 100-50 MCG/ACT AEPB Inhale 1 puff into the lungs 2 (two) times daily.    ? furosemide (LASIX) 20 MG tablet TAKE 2 TABLETS BY MOUTH DAILY AS NEEDED 180 tablet 1  ? latanoprost (XALATAN) 0.005 % ophthalmic solution Place 1 drop into both eyes at bedtime.  5  ? leuprolide, 6 Month, (ELIGARD) 45 MG injection Inject 45 mg into the skin every 6 (six) months.    ? Vitamin D, Cholecalciferol, 25 MCG (1000 UT) CAPS Take 1 capsule by mouth daily.    ? WIXELA INHUB 250-50 MCG/ACT AEPB INHALE 1 PUFF INTO THE LUNGS TWICE DAILY. 180 each 3  ? ?No current facility-administered medications for this encounter.  ? ? ?ECOG PERFORMANCE STATUS:  0 - Asymptomatic ? ?REVIEW OF SYSTEMS: Patient has congestive heart failure is a former smoker also has hypertension osteoporosis as well as COPD ?Patient denies any weight loss, fatigue, weakness, fever, chills or night sweats. Patient denies any loss of vision, blurred vision. Patient denies any ringing  of the ears or hearing loss. No irregular heartbeat. Patient denies heart murmur or history of fainting. Patient denies any chest pain or pain radiating to her upper extremities. Patient denies any shortness of  breath, difficulty breathing at night, cough or hemoptysis. Patient denies any swelling in the lower legs. Patient denies any nausea vomiting, vomiting of blood, or coffee ground material in the vomitus. Patient denies any stomach pain. Patient states has had normal bowel movements no significant constipation or diarrhea. Patient denies any dysuria, hematuria or significant nocturia. Patient denies any problems walking, swelling in the joints or loss of balance. Patient denies any skin changes, loss of hair or loss of weight. Patient denies any excessive worrying or anxiety or significant depression. Patient denies any problems with insomnia. Patient denies excessive thirst, polyuria, polydipsia. Patient denies any swollen glands, patient denies easy bruising or easy bleeding. Patient denies any recent infections, allergies or URI. Patient "s visual fields have not changed significantly in recent time. ?  ?PHYSICAL EXAM: ?BP (!) 156/85 (BP Location: Left Arm, Patient Position: Sitting)   Pulse 65   Resp 16   Ht 5\' 9"  (1.753 m)   Wt 175 lb 11.2 oz (79.7 kg)   BMI 25.95 kg/m?  ?Well-developed well-nourished patient in NAD. HEENT reveals PERLA, EOMI, discs not visualized.  Oral cavity is clear. No oral mucosal lesions are identified. Neck is clear without evidence of cervical or supraclavicular adenopathy. Lungs are clear to A&P. Cardiac examination is essentially unremarkable with regular rate and rhythm without murmur rub or thrill. Abdomen is benign with no organomegaly or masses noted. Motor sensory and DTR levels are equal and symmetric in the upper and lower extremities. Cranial nerves II through XII are grossly intact. Proprioception is intact. No peripheral adenopathy or edema is identified. No motor or sensory levels are noted. Crude visual fields are within normal range. ? ?LABORATORY DATA: Labs reviewed ? ?  ?RADIOLOGY RESULTS: Serial CT scans and PET CT scans reviewed compatible with above-stated  findings ? ? ?IMPRESSION: Stage I non-small cell lung cancer of the right upper lobe progressing over time in 88 year old male ? ?PLAN: This time I am offering SBRT 60 Gray in 5 fractions to this lesion.  We followed it for 2 years with progression all consistent with low-grade adenocarcinoma.  Risks and benefits of SBRT including extremely low side effect profile were reviewed with the patient and his daughter.  We will use for dimensional treatment planning as well as motion restriction for treatment planning purposes.  I have personally set up and ordered CT simulation for about approximately 1 week's time. ? ?I would like to take this opportunity to thank you for allowing me to participate in the care of your patient.. ? ?Noreene Filbert, MD ? ? ? ? ? ? ? ? ?

## 2021-07-28 ENCOUNTER — Ambulatory Visit
Admission: RE | Admit: 2021-07-28 | Discharge: 2021-07-28 | Disposition: A | Discharge status: Home / Self Care | Payer: Medicare Other | Source: Ambulatory Visit | Attending: Radiation Oncology | Admitting: Radiation Oncology

## 2021-07-28 DIAGNOSIS — Z87891 Personal history of nicotine dependence: Secondary | ICD-10-CM | POA: Diagnosis not present

## 2021-07-28 DIAGNOSIS — C3411 Malignant neoplasm of upper lobe, right bronchus or lung: Secondary | ICD-10-CM | POA: Insufficient documentation

## 2021-07-28 DIAGNOSIS — Z510 Encounter for antineoplastic radiation therapy: Secondary | ICD-10-CM | POA: Diagnosis not present

## 2021-08-03 DIAGNOSIS — Z87891 Personal history of nicotine dependence: Secondary | ICD-10-CM | POA: Diagnosis not present

## 2021-08-03 DIAGNOSIS — Z510 Encounter for antineoplastic radiation therapy: Secondary | ICD-10-CM | POA: Diagnosis not present

## 2021-08-03 DIAGNOSIS — C3411 Malignant neoplasm of upper lobe, right bronchus or lung: Secondary | ICD-10-CM | POA: Diagnosis not present

## 2021-08-03 DIAGNOSIS — H34812 Central retinal vein occlusion, left eye, with macular edema: Secondary | ICD-10-CM | POA: Diagnosis not present

## 2021-08-04 ENCOUNTER — Ambulatory Visit
Admission: RE | Admit: 2021-08-04 | Discharge: 2021-08-04 | Disposition: A | Discharge status: Home / Self Care | Payer: Medicare Other | Source: Ambulatory Visit | Attending: Radiation Oncology | Admitting: Radiation Oncology

## 2021-08-04 DIAGNOSIS — C3411 Malignant neoplasm of upper lobe, right bronchus or lung: Secondary | ICD-10-CM | POA: Diagnosis not present

## 2021-08-04 DIAGNOSIS — Z510 Encounter for antineoplastic radiation therapy: Secondary | ICD-10-CM | POA: Diagnosis not present

## 2021-08-06 ENCOUNTER — Ambulatory Visit
Admission: RE | Admit: 2021-08-06 | Discharge: 2021-08-06 | Disposition: A | Discharge status: Home / Self Care | Payer: Medicare Other | Source: Ambulatory Visit | Attending: Radiation Oncology | Admitting: Radiation Oncology

## 2021-08-06 DIAGNOSIS — Z510 Encounter for antineoplastic radiation therapy: Secondary | ICD-10-CM | POA: Diagnosis not present

## 2021-08-06 DIAGNOSIS — C3411 Malignant neoplasm of upper lobe, right bronchus or lung: Secondary | ICD-10-CM | POA: Diagnosis not present

## 2021-08-11 ENCOUNTER — Other Ambulatory Visit: Payer: Self-pay

## 2021-08-11 ENCOUNTER — Ambulatory Visit
Admission: RE | Admit: 2021-08-11 | Discharge: 2021-08-11 | Disposition: A | Discharge status: Home / Self Care | Payer: Medicare Other | Source: Ambulatory Visit | Attending: Radiation Oncology | Admitting: Radiation Oncology

## 2021-08-11 DIAGNOSIS — Z510 Encounter for antineoplastic radiation therapy: Secondary | ICD-10-CM | POA: Diagnosis not present

## 2021-08-11 DIAGNOSIS — C3411 Malignant neoplasm of upper lobe, right bronchus or lung: Secondary | ICD-10-CM | POA: Diagnosis not present

## 2021-08-11 LAB — RAD ONC ARIA SESSION SUMMARY
Course Elapsed Days: 7
Plan Fractions Treated to Date: 3
Plan Prescribed Dose Per Fraction: 12 Gy
Plan Total Fractions Prescribed: 5
Plan Total Prescribed Dose: 60 Gy
Reference Point Dosage Given to Date: 36 Gy
Reference Point Session Dosage Given: 12 Gy
Session Number: 3

## 2021-08-13 ENCOUNTER — Ambulatory Visit
Admission: RE | Admit: 2021-08-13 | Discharge: 2021-08-13 | Disposition: A | Discharge status: Home / Self Care | Payer: Medicare Other | Source: Ambulatory Visit | Attending: Radiation Oncology | Admitting: Radiation Oncology

## 2021-08-13 ENCOUNTER — Other Ambulatory Visit: Payer: Self-pay

## 2021-08-13 DIAGNOSIS — C3411 Malignant neoplasm of upper lobe, right bronchus or lung: Secondary | ICD-10-CM | POA: Diagnosis not present

## 2021-08-13 DIAGNOSIS — Z510 Encounter for antineoplastic radiation therapy: Secondary | ICD-10-CM | POA: Diagnosis not present

## 2021-08-13 LAB — RAD ONC ARIA SESSION SUMMARY
Course Elapsed Days: 9
Plan Fractions Treated to Date: 4
Plan Prescribed Dose Per Fraction: 12 Gy
Plan Total Fractions Prescribed: 5
Plan Total Prescribed Dose: 60 Gy
Reference Point Dosage Given to Date: 48 Gy
Reference Point Session Dosage Given: 12 Gy
Session Number: 4

## 2021-08-14 ENCOUNTER — Ambulatory Visit: Payer: Medicare Other

## 2021-08-14 DIAGNOSIS — I10 Essential (primary) hypertension: Secondary | ICD-10-CM | POA: Diagnosis not present

## 2021-08-14 DIAGNOSIS — N183 Chronic kidney disease, stage 3 unspecified: Secondary | ICD-10-CM | POA: Diagnosis not present

## 2021-08-17 DIAGNOSIS — N183 Chronic kidney disease, stage 3 unspecified: Secondary | ICD-10-CM | POA: Diagnosis not present

## 2021-08-17 DIAGNOSIS — I10 Essential (primary) hypertension: Secondary | ICD-10-CM | POA: Diagnosis not present

## 2021-08-17 DIAGNOSIS — D649 Anemia, unspecified: Secondary | ICD-10-CM | POA: Diagnosis not present

## 2021-08-17 DIAGNOSIS — E870 Hyperosmolality and hypernatremia: Secondary | ICD-10-CM | POA: Diagnosis not present

## 2021-08-19 ENCOUNTER — Ambulatory Visit
Admission: RE | Admit: 2021-08-19 | Discharge: 2021-08-19 | Disposition: A | Discharge status: Home / Self Care | Payer: Medicare Other | Source: Ambulatory Visit | Attending: Radiation Oncology | Admitting: Radiation Oncology

## 2021-08-19 ENCOUNTER — Other Ambulatory Visit: Payer: Self-pay

## 2021-08-19 DIAGNOSIS — C3411 Malignant neoplasm of upper lobe, right bronchus or lung: Secondary | ICD-10-CM | POA: Diagnosis not present

## 2021-08-19 DIAGNOSIS — Z510 Encounter for antineoplastic radiation therapy: Secondary | ICD-10-CM | POA: Diagnosis not present

## 2021-08-19 DIAGNOSIS — Z87891 Personal history of nicotine dependence: Secondary | ICD-10-CM | POA: Diagnosis not present

## 2021-08-19 LAB — RAD ONC ARIA SESSION SUMMARY
Course Elapsed Days: 15
Plan Fractions Treated to Date: 5
Plan Prescribed Dose Per Fraction: 12 Gy
Plan Total Fractions Prescribed: 5
Plan Total Prescribed Dose: 60 Gy
Reference Point Dosage Given to Date: 60 Gy
Reference Point Session Dosage Given: 12 Gy
Session Number: 5

## 2021-08-21 ENCOUNTER — Ambulatory Visit: Payer: Medicare Other

## 2021-08-28 ENCOUNTER — Other Ambulatory Visit: Payer: Self-pay | Admitting: Internal Medicine

## 2021-08-31 DIAGNOSIS — Z85828 Personal history of other malignant neoplasm of skin: Secondary | ICD-10-CM | POA: Diagnosis not present

## 2021-08-31 DIAGNOSIS — L814 Other melanin hyperpigmentation: Secondary | ICD-10-CM | POA: Diagnosis not present

## 2021-08-31 DIAGNOSIS — L578 Other skin changes due to chronic exposure to nonionizing radiation: Secondary | ICD-10-CM | POA: Diagnosis not present

## 2021-08-31 DIAGNOSIS — L821 Other seborrheic keratosis: Secondary | ICD-10-CM | POA: Diagnosis not present

## 2021-08-31 DIAGNOSIS — L570 Actinic keratosis: Secondary | ICD-10-CM | POA: Diagnosis not present

## 2021-08-31 DIAGNOSIS — L820 Inflamed seborrheic keratosis: Secondary | ICD-10-CM | POA: Diagnosis not present

## 2021-08-31 DIAGNOSIS — Q828 Other specified congenital malformations of skin: Secondary | ICD-10-CM | POA: Diagnosis not present

## 2021-09-02 ENCOUNTER — Encounter: Payer: Self-pay | Admitting: Internal Medicine

## 2021-09-02 ENCOUNTER — Ambulatory Visit (INDEPENDENT_AMBULATORY_CARE_PROVIDER_SITE_OTHER): Payer: Medicare Other | Admitting: Internal Medicine

## 2021-09-02 VITALS — BP 166/100 | HR 75 | Temp 97.3°F | Ht 69.0 in | Wt 172.6 lb

## 2021-09-02 DIAGNOSIS — E538 Deficiency of other specified B group vitamins: Secondary | ICD-10-CM

## 2021-09-02 DIAGNOSIS — N182 Chronic kidney disease, stage 2 (mild): Secondary | ICD-10-CM | POA: Diagnosis not present

## 2021-09-02 DIAGNOSIS — N401 Enlarged prostate with lower urinary tract symptoms: Secondary | ICD-10-CM

## 2021-09-02 DIAGNOSIS — C3411 Malignant neoplasm of upper lobe, right bronchus or lung: Secondary | ICD-10-CM

## 2021-09-02 DIAGNOSIS — J432 Centrilobular emphysema: Secondary | ICD-10-CM | POA: Diagnosis not present

## 2021-09-02 DIAGNOSIS — R3914 Feeling of incomplete bladder emptying: Secondary | ICD-10-CM | POA: Diagnosis not present

## 2021-09-02 DIAGNOSIS — I700 Atherosclerosis of aorta: Secondary | ICD-10-CM

## 2021-09-02 DIAGNOSIS — I10 Essential (primary) hypertension: Secondary | ICD-10-CM | POA: Diagnosis not present

## 2021-09-02 DIAGNOSIS — I7121 Aneurysm of the ascending aorta, without rupture: Secondary | ICD-10-CM

## 2021-09-02 LAB — COMPREHENSIVE METABOLIC PANEL
ALT: 10 U/L (ref 0–53)
AST: 18 U/L (ref 0–37)
Albumin: 3.9 g/dL (ref 3.5–5.2)
Alkaline Phosphatase: 67 U/L (ref 39–117)
BUN: 24 mg/dL — ABNORMAL HIGH (ref 6–23)
CO2: 32 mEq/L (ref 19–32)
Calcium: 9.3 mg/dL (ref 8.4–10.5)
Chloride: 106 mEq/L (ref 96–112)
Creatinine, Ser: 1.49 mg/dL (ref 0.40–1.50)
GFR: 40.34 mL/min — ABNORMAL LOW (ref >60.00)
Glucose, Bld: 91 mg/dL (ref 70–99)
Potassium: 4.1 mEq/L (ref 3.5–5.1)
Sodium: 144 mEq/L (ref 135–145)
Total Bilirubin: 0.8 mg/dL (ref 0.2–1.2)
Total Protein: 5.8 g/dL — ABNORMAL LOW (ref 6.0–8.3)

## 2021-09-02 LAB — LIPID PANEL
Cholesterol: 101 mg/dL (ref 0–200)
HDL: 52.4 mg/dL (ref >39.00)
LDL Cholesterol: 32 mg/dL (ref 0–99)
NonHDL: 48.99
Total CHOL/HDL Ratio: 2
Triglycerides: 86 mg/dL (ref 0.0–149.0)
VLDL: 17.2 mg/dL (ref 0.0–40.0)

## 2021-09-02 LAB — LDL CHOLESTEROL, DIRECT: Direct LDL: 28 mg/dL

## 2021-09-02 NOTE — Patient Instructions (Addendum)
You are doing well!   But You need a minimum of 24 ounces of water daily  to keep your sodium down and your kidneys working ... (Don't count the ice)  ? ?Please schedule a nurse visit to have your home blood pressure machine checked  ?

## 2021-09-02 NOTE — Progress Notes (Signed)
? ?Subjective:  ?Patient ID: Eric Hicks, male    DOB: 1926-12-28  Age: 88 y.o. MRN: [id number] ? ?CC: The primary encounter diagnosis was Primary hypertension. Diagnoses of CKD (chronic kidney disease), stage II, Dietary B12 deficiency, Aortic arch atherosclerosis ([place]), Benign prostatic hyperplasia with incomplete bladder emptying, Centrilobular emphysema ([place]), Aneurysm of ascending aorta without rupture ([place]), Malignant neoplasm of upper lobe of right lung ([place]), and White coat syndrome with diagnosis of hypertension were also pertinent to this visit. ? ? ?This visit occurred during the SARS-CoV-2 public health emergency.  Safety protocols were in place, including screening questions prior to the visit, additional usage of staff PPE, and extensive cleaning of exam room while observing appropriate contact time as indicated for disinfecting solutions.   ? ?HPI ?Eric Hicks presents for  follow up on multiple issues  ?Chief Complaint  ?Patient presents with  ? Follow-up  ?  6 month follow up on hypertension and hyperlipidemia  ? ? ?1) HYPERNATREMIA;  noted on recent labs . Diet reviewed:  He is not drinking enough water.   ? ?2) CKD  stable [facility] Neph[facility] following /  ? ?3) Weight stable.  Weighing daily and there has been little change .  He is minimally using diuretics ? ?4) HTN:  Hypertension: patient checks blood pressure twice weekly at home.  Readings have been for the most part < 140/80 at rest . Patient is following a reduce salt diet most days and is taking medications as prescribed.  He is noted to have persistently  elevated office readings 160/100.  Home readings done  ? ?5) Prostate CA:  PSA  has been  undetectable  .  He is receiving Lupron  ? ?6) Lung CA:  received  XRT x 5 doses   sees [facility] in June for repeat scan. RUL, non small cell lung CA .  Feels fine. Still  IADLs  ? ?7) celebrated 21 rd birthday with 45 family members in [place] (near [place]) .  Still in a relationship with a lady friend  ; relationship remains  platonic per her wishes ? ?8) Left eye macular degeneration:  he is  receiving injections  ? ?Outpatient Medications Prior to Visit  ?Medication Sig Dispense Refill  ? Aflibercept (EYLEA IO) Inject into the eye.    ? AMBULATORY NON FORMULARY MEDICATION Joint Advantage Gold ?2 tablets daily    ? atorvastatin (LIPITOR) 20 MG tablet TAKE 1 TABLET BY MOUTH EVERY OTHER DAY 45 tablet 3  ? calcium carbonate (OS-CAL) 600 MG TABS Take 600 mg by mouth daily with breakfast.     ? carvedilol (COREG) 6.25 MG tablet Take 6.25 mg by mouth 2 (two) times daily.    ? dorzolamide-timolol (COSOPT) 22.3-6.8 MG/ML ophthalmic solution TAKE 1 DROP(S) IN RIGHT EYE 2 TIMES A DAY  5  ? doxazosin (CARDURA) 1 MG tablet TAKE 1 TABLET BY MOUTH EVERY DAY 90 tablet 1  ? fluticasone-salmeterol (ADVAIR) 100-50 MCG/ACT AEPB Inhale 1 puff into the lungs 2 (two) times daily.    ? furosemide (LASIX) 20 MG tablet TAKE 2 TABLETS BY MOUTH DAILY AS NEEDED 180 tablet 1  ? latanoprost (XALATAN) 0.005 % ophthalmic solution Place 1 drop into both eyes at bedtime.  5  ? leuprolide, 6 Month, (ELIGARD) 45 MG injection Inject 45 mg into the skin every 6 (six) months.    ? Vitamin D, Cholecalciferol, 25 MCG (1000 UT) CAPS Take 1 capsule by mouth daily.    ? [place] Ruts  INHUB 250-50 MCG/ACT AEPB INHALE 1 PUFF INTO THE LUNGS TWICE DAILY. 180 each 3  ? ?No facility-administered medications prior to visit.  ? ? ?Review of Systems; ? ?Patient denies headache, fevers, malaise, unintentional weight loss, skin rash, eye pain, sinus congestion and sinus pain, sore throat, dysphagia,  hemoptysis , cough, dyspnea, wheezing, chest pain, palpitations, orthopnea, edema, abdominal pain, nausea, melena, diarrhea, constipation, flank pain, dysuria, hematuria, urinary  Frequency, nocturia, numbness, tingling, seizures,  Focal weakness, Loss of consciousness,  Tremor, insomnia, depression, anxiety, and suicidal ideation.   ? ? ? ?Objective:  ?BP (!) 166/100 (BP  Location: Left Arm, Patient Position: Sitting, Cuff Size: Normal)   Pulse 75   Temp (!) 97.3 ?F (36.3 ?C) (Oral)   Ht 5\' 9"  (1.753 m)   Wt 172 lb 9.6 oz (78.3 kg)   SpO2 96%   BMI 25.49 kg/m?  ? ?BP Readings from Last 3 Encounters:  ?09/02/21 (!) 166/100  ?07/22/21 (!) 156/85  ?07/17/21 (!) 160/95  ? ? ?Wt Readings from Last 3 Encounters:  ?09/02/21 172 lb 9.6 oz (78.3 kg)  ?07/22/21 175 lb 11.2 oz (79.7 kg)  ?07/17/21 174 lb 12.8 oz (79.3 kg)  ? ? ?General appearance: alert, cooperative and appears stated age ?Ears: normal TM's and external ear canals both ears ?Throat: lips, mucosa, and tongue normal; teeth and gums normal ?Neck: no adenopathy, no carotid bruit, supple, symmetrical, trachea midline and thyroid not enlarged, symmetric, no tenderness/mass/nodules ?Back: symmetric, no curvature. ROM normal. No CVA tenderness. ?Lungs: clear to auscultation bilaterally ?Heart: regular rate and rhythm, S1, S2 normal, no murmur, click, rub or gallop ?Abdomen: soft, non-tender; bowel sounds normal; no masses,  no organomegaly ?Pulses: 2+ and symmetric ?Skin: Skin color, texture, turgor normal. No rashes or lesions ?Lymph nodes: Cervical, supraclavicular, and axillary nodes normal. ? ?No results found for: HGBA1C ? ?Lab Results  ?Component Value Date  ? CREATININE 1.49 09/02/2021  ? CREATININE 1.60 (H) 07/13/2021  ? CREATININE 1.41 03/04/2021  ? ? ?Lab Results  ?Component Value Date  ? WBC 5.9 08/30/2019  ? HGB 13.2 08/30/2019  ? HCT 39.1 08/30/2019  ? PLT 187.0 08/30/2019  ? GLUCOSE 91 09/02/2021  ? CHOL 101 09/02/2021  ? TRIG 86.0 09/02/2021  ? HDL 52.40 09/02/2021  ? LDLDIRECT 28.0 09/02/2021  ? [place] 32 09/02/2021  ? ALT 10 09/02/2021  ? AST 18 09/02/2021  ? NA 144 09/02/2021  ? K 4.1 09/02/2021  ? CL 106 09/02/2021  ? CREATININE 1.49 09/02/2021  ? BUN 24 (H) 09/02/2021  ? CO2 32 09/02/2021  ? TSH 1.69 06/14/2018  ? PSA 13.67 (H) 05/15/2014  ? MICROALBUR <0.7 06/20/2014  ? ? ?No results found. ? ?Assessment &  Plan:  ? ?Problem List Items Addressed This Visit   ? ? Aortic arch atherosclerosis ([place])  ?  He is now managing his atherosclerosis with atorvastatin, finally (after discussing with Paraschos) ? ?  ?  ? BPH (benign prostatic hyperplasia)  ?  He Is taking doxasozin  ? ?  ?  ? CKD (chronic kidney disease), stage II  ?  GFR has improved per patient report.  Seeing [facility] nephrologist once annually.  ? ?  ?  ? Relevant Orders  ? Comprehensive metabolic panel (Completed)  ? COPD (chronic obstructive pulmonary disease) with emphysema ([place])  ?  He remains asymptomatic with ADLS,  Some dyspnea with exertion without desauration s ? ?  ?  ? Lung cancer, upper lobe ([place])  ?  He has completed treatment with XRT for a presumed indolent but growing non small cell lung cancer (no biopsy).  He remains asymptomatic ? ?  ?  ? Thoracic ascending aortic aneurysm ([place])  ?  There is no intervention planned due to age. He is reminded to continue aggressive control of blood pressure. (goal 072 or less systolic ) ? ?  ?  ? White coat syndrome with diagnosis of hypertension - Primary  ?  Not at goal based on office reading,s  But  His Home readings have been reviewed and are at or below  120/70  On a  new monitor.  Recommending and  RN VISIT to calibrate new machine  ? ?  ?  ? ?Other Visit Diagnoses   ? ? Dietary B12 deficiency      ? Relevant Orders  ? Methylmalonic Acid  ? ?  ? ?  ? ?Follow-up: No follow-ups on file. ? ? ?Crecencio Mc, MD ?

## 2021-09-02 NOTE — Assessment & Plan Note (Addendum)
Not at goal based on office reading,s  But  His Home readings have been reviewed and are at or below  120/70  On a  new monitor.  Recommending and  RN VISIT to calibrate new machine  ?

## 2021-09-05 LAB — METHYLMALONIC ACID, SERUM: Methylmalonic Acid, Quant: 635 nmol/L — ABNORMAL HIGH (ref 87–318)

## 2021-09-05 NOTE — Assessment & Plan Note (Signed)
GFR has improved per patient report.  Seeing [facility] nephrologist once annually.  ?

## 2021-09-05 NOTE — Assessment & Plan Note (Addendum)
He has completed treatment with XRT for a presumed indolent but growing non small cell lung cancer (no biopsy).  He remains asymptomatic ?

## 2021-09-05 NOTE — Assessment & Plan Note (Signed)
He is now managing his atherosclerosis with atorvastatin, finally (after discussing with Paraschos) ?

## 2021-09-05 NOTE — Assessment & Plan Note (Signed)
There is no intervention planned due to age. He is reminded to continue aggressive control of blood pressure. (goal 677 or less systolic ) ?

## 2021-09-05 NOTE — Assessment & Plan Note (Signed)
He Is taking doxasozin  ?

## 2021-09-05 NOTE — Assessment & Plan Note (Signed)
He remains asymptomatic with ADLS,  Some dyspnea with exertion without desauration s ?

## 2021-09-06 ENCOUNTER — Other Ambulatory Visit: Payer: Self-pay | Admitting: Internal Medicine

## 2021-09-06 DIAGNOSIS — E538 Deficiency of other specified B group vitamins: Secondary | ICD-10-CM | POA: Insufficient documentation

## 2021-09-08 ENCOUNTER — Telehealth: Payer: Self-pay

## 2021-09-08 NOTE — Telephone Encounter (Signed)
error 

## 2021-09-11 ENCOUNTER — Ambulatory Visit (INDEPENDENT_AMBULATORY_CARE_PROVIDER_SITE_OTHER): Payer: Medicare Other

## 2021-09-11 DIAGNOSIS — E538 Deficiency of other specified B group vitamins: Secondary | ICD-10-CM

## 2021-09-11 DIAGNOSIS — H6983 Other specified disorders of Eustachian tube, bilateral: Secondary | ICD-10-CM | POA: Diagnosis not present

## 2021-09-11 DIAGNOSIS — H6021 Malignant otitis externa, right ear: Secondary | ICD-10-CM | POA: Diagnosis not present

## 2021-09-11 MED ORDER — CYANOCOBALAMIN 1000 MCG/ML IJ SOLN
1000.0000 ug | Freq: Once | INTRAMUSCULAR | Status: AC
  Administered 2021-09-11: 1000 ug via INTRAMUSCULAR

## 2021-09-11 NOTE — Progress Notes (Signed)
Patient presented for B 12 injection to left deltoid, patient voiced no concerns nor showed any signs of distress during injection. 

## 2021-09-15 LAB — INTRINSIC FACTOR ANTIBODIES: Intrinsic Factor: POSITIVE — AB

## 2021-09-16 ENCOUNTER — Ambulatory Visit (INDEPENDENT_AMBULATORY_CARE_PROVIDER_SITE_OTHER): Payer: Medicare Other

## 2021-09-16 DIAGNOSIS — E538 Deficiency of other specified B group vitamins: Secondary | ICD-10-CM

## 2021-09-16 DIAGNOSIS — I10 Essential (primary) hypertension: Secondary | ICD-10-CM

## 2021-09-16 MED ORDER — CYANOCOBALAMIN 1000 MCG/ML IJ SOLN
1000.0000 ug | Freq: Once | INTRAMUSCULAR | Status: AC
  Administered 2021-09-16: 1000 ug via INTRAMUSCULAR

## 2021-09-16 NOTE — Progress Notes (Signed)
Patient presented for B 12 injection to left deltoid, patient voiced no concerns nor showed any signs of distress during injection.  Pt also was here for BP machine check. Patient rested at least 5 minute with feet flat on the floor, which he stated that he also does at home. BP machine read 138/81 P 62 & when I took manually I heard 132/80 P 61. I concluded that BP cuff what very accurate & was also brand new. I advised that we would call if any changes were needed to medications & also some BP readings he had taken have been placed in your results folder to view.

## 2021-09-24 ENCOUNTER — Encounter: Payer: Self-pay | Admitting: Radiation Oncology

## 2021-09-24 ENCOUNTER — Ambulatory Visit
Admission: RE | Admit: 2021-09-24 | Discharge: 2021-09-24 | Disposition: A | Discharge status: Home / Self Care | Payer: Medicare Other | Source: Ambulatory Visit | Attending: Radiation Oncology | Admitting: Radiation Oncology

## 2021-09-24 VITALS — BP 141/90 | HR 63 | Temp 98.0°F | Resp 20 | Wt 171.2 lb

## 2021-09-24 DIAGNOSIS — R911 Solitary pulmonary nodule: Secondary | ICD-10-CM

## 2021-09-24 DIAGNOSIS — Z923 Personal history of irradiation: Secondary | ICD-10-CM | POA: Insufficient documentation

## 2021-09-24 DIAGNOSIS — R918 Other nonspecific abnormal finding of lung field: Secondary | ICD-10-CM | POA: Diagnosis not present

## 2021-09-24 NOTE — Progress Notes (Signed)
Radiation Oncology Follow up Note  Name: Eric Hicks   Date:   09/24/2021 MRN:  [id number] DOB: [birth year]    This 88 y.o. male presents to the clinic today for 1 month follow-up status post SBRT for stage I non-small cell lung cancer of the right upper lobe.  REFERRING PROVIDER: Crecencio Mc, MD  HPI: Patient is a 88 year old male now out 1 month admitted SBRT to his right upper lobe for stage I non-small cell lung cancer.  Seen today in routine follow-up he is doing well specifically denies cough hemoptysis chest tightness or any change in his pulmonary status..  COMPLICATIONS OF TREATMENT: none  FOLLOW UP COMPLIANCE: keeps appointments   PHYSICAL EXAM:  BP (!) 141/90 (BP Location: Left Arm, Patient Position: Sitting, Cuff Size: Normal)   Pulse 63   Temp 98 F (36.7 C) (Tympanic)   Resp 20   Wt 171 lb 3.2 oz (77.7 kg)   BMI 25.28 kg/m  Well-developed well-nourished patient in NAD. HEENT reveals PERLA, EOMI, discs not visualized.  Oral cavity is clear. No oral mucosal lesions are identified. Neck is clear without evidence of cervical or supraclavicular adenopathy. Lungs are clear to A&P. Cardiac examination is essentially unremarkable with regular rate and rhythm without murmur rub or thrill. Abdomen is benign with no organomegaly or masses noted. Motor sensory and DTR levels are equal and symmetric in the upper and lower extremities. Cranial nerves II through XII are grossly intact. Proprioception is intact. No peripheral adenopathy or edema is identified. No motor or sensory levels are noted. Crude visual fields are within normal range.  RADIOLOGY RESULTS: CT scan ordered in 3 months  PLAN: Present time patient is doing well very low side effect profile from SBRT.  Of asked to see him back in 3 months with a CT scan of his chest prior to that visit.  Patient and family know to call with any concerns.  I would like to take this opportunity to thank you for allowing me to  participate in the care of your patient.Noreene Filbert, MD

## 2021-09-28 ENCOUNTER — Other Ambulatory Visit: Payer: Self-pay | Admitting: Internal Medicine

## 2021-09-28 DIAGNOSIS — H401112 Primary open-angle glaucoma, right eye, moderate stage: Secondary | ICD-10-CM | POA: Diagnosis not present

## 2021-09-28 DIAGNOSIS — H34812 Central retinal vein occlusion, left eye, with macular edema: Secondary | ICD-10-CM | POA: Diagnosis not present

## 2021-09-28 DIAGNOSIS — Z961 Presence of intraocular lens: Secondary | ICD-10-CM | POA: Diagnosis not present

## 2021-09-28 DIAGNOSIS — H401121 Primary open-angle glaucoma, left eye, mild stage: Secondary | ICD-10-CM | POA: Diagnosis not present

## 2021-10-07 ENCOUNTER — Ambulatory Visit (INDEPENDENT_AMBULATORY_CARE_PROVIDER_SITE_OTHER): Payer: Medicare Other

## 2021-10-07 VITALS — Ht 69.0 in | Wt 171.0 lb

## 2021-10-07 DIAGNOSIS — Z0000 Encounter for general adult medical examination without abnormal findings: Secondary | ICD-10-CM | POA: Diagnosis not present

## 2021-10-07 NOTE — Progress Notes (Addendum)
Subjective:   Eric Hicks is a 88 y.o. male who presents for Medicare Annual/Subsequent preventive examination.  Review of Systems    No ROS.  Medicare Wellness Virtual Visit.  Visual/audio telehealth visit, UTA vital signs.   See social history for additional risk factors.   Cardiac Risk Factors include: advanced age (>63mn, >>17women);male gender     Objective:    Today's Vitals   10/07/21 1040  Weight: 171 lb (77.6 kg)  Height: 5' 9" (1.753 m)   Body mass index is 25.25 kg/m.     10/07/2021   10:44 AM 09/24/2021    9:25 AM 07/22/2021    1:59 PM 07/17/2021   10:49 AM 01/13/2021   11:10 AM 10/06/2020   10:47 AM 07/09/2020   11:01 AM  Advanced Directives  Does Patient Have a Medical Advance Directive? _0  Yes No  Type of ACorporate treasurerof ACeibaLiving will HSiesta AcresLiving will Living will;Healthcare Power of Attorney Living will;Healthcare Power of [place] GroveLiving will   Does patient want to make changes to medical advance directive? No - Patient declined No - Patient declined    No - Patient declined   Copy of [place] Chart?  No - copy requested No - copy requested   Yes - validated most recent copy scanned in chart (See row information)     Current Medications (verified) Outpatient Encounter Medications as of 10/07/2021  Medication Sig   Aflibercept (EYLEA IO) Inject into the eye.   AMBULATORY NON FORMULARY MEDICATION Joint Advantage Gold 2 tablets daily   atorvastatin (LIPITOR) 20 MG tablet TAKE 1 TABLET BY MOUTH EVERY OTHER DAY   calcium carbonate (OS-CAL) 600 MG TABS Take 600 mg by mouth daily with breakfast.    carvedilol (COREG) 6.25 MG tablet Take 6.25 mg by mouth 2 (two) times daily.   dorzolamide-timolol (COSOPT) 22.3-6.8 MG/ML ophthalmic solution TAKE 1 DROP(S) IN RIGHT EYE 2 TIMES A DAY   doxazosin (CARDURA) 1 MG tablet TAKE 1 TABLET BY MOUTH EVERY  DAY   fluticasone-salmeterol (ADVAIR) 100-50 MCG/ACT AEPB Inhale 1 puff into the lungs 2 (two) times daily.   furosemide (LASIX) 20 MG tablet TAKE 2 TABLETS BY MOUTH DAILY AS NEEDED   latanoprost (XALATAN) 0.005 % ophthalmic solution Place 1 drop into both eyes at bedtime.   leuprolide, 6 Month, (ELIGARD) 45 MG injection Inject 45 mg into the skin every 6 (six) months.   Vitamin D, Cholecalciferol, 25 MCG (1000 UT) CAPS Take 1 capsule by mouth daily.   No facility-administered encounter medications on file as of 10/07/2021.    Allergies (verified) Tylenol [acetaminophen]   History: Past Medical History:  Diagnosis Date   BPH (benign prostatic hyperplasia)    managed by DOlena Heckle  COPD (chronic obstructive pulmonary disease) (HCC)    Elevated PSA, between 10 and less than 20 ng/ml    DOlena Heckle watchful waiting   Hypertension    Osteoporosis    by DEXA   Past Surgical History:  Procedure Laterality Date   TYMPANOSTOMY TUBE PLACEMENT  2017   Family History  Problem Relation Age of Onset   Diabetes Mother    Hypertension Father    Cancer Brother 858      multiple myeloma   Social History   Socioeconomic History   Marital status: Widowed    Spouse name: Not on file   Number of children: Not on  file   Years of education: Not on file   Highest education level: Not on file  Occupational History   Not on file  Tobacco Use   Smoking status: Former   Smokeless tobacco: Never  Vaping Use   Vaping Use: Never used  Substance and Sexual Activity   Alcohol use: No   Drug use: Not on file   Sexual activity: Never  Other Topics Concern   Not on file  Social History Narrative   Regular exercise-yew   Social Determinants of Health   Financial Resource Strain: Low Risk  (10/07/2021)   Overall Financial Resource Strain (CARDIA)    Difficulty of Paying Living Expenses: Not hard at all  Food Insecurity: No Food Insecurity (10/07/2021)   Hunger Vital Sign    Worried About Running  Out of Food in the Last Year: Never true    Ran Out of Food in the Last Year: Never true  Transportation Needs: No Transportation Needs (10/07/2021)   PRAPARE - Hydrologist (Medical): No    Lack of Transportation (Non-Medical): No  Physical Activity: Not on file  Stress: No Stress Concern Present (10/07/2021)   [place]    Feeling of Stress : Not at all  Social Connections: Unknown (10/07/2021)   Social Connection and Isolation Panel [NHANES]    Frequency of Communication with Friends and Family: More than three times a week    Frequency of Social Gatherings with Friends and Family: Not on file    Attends Religious Services: Not on file    Active Member of Clubs or Organizations: Not on file    Attends Archivist Meetings: Not on file    Marital Status: Widowed   Tobacco Counseling Counseling given: Not Answered  Clinical Intake: Pre-visit preparation completed: Yes        Diabetes: No  How often do you need to have someone help you when you read instructions, pamphlets, or other written materials from your doctor or pharmacy?: 1 - Never    Interpreter Needed?: No    Activities of Daily Living    10/07/2021   10:46 AM  In your present state of health, do you have any difficulty performing the following activities:  Hearing? 1  Comment Hearing aids, bilateral  Vision? 0  Difficulty concentrating or making decisions? 0  Walking or climbing stairs? 0  Dressing or bathing? 0  Doing errands, shopping? 0  Preparing Food and eating ? N  Using the Toilet? N  In the past six months, have you accidently leaked urine? N  Do you have problems with loss of bowel control? N  Managing your Medications? N  Managing your Finances? N  Housekeeping or managing your Housekeeping? N   Patient Care Team: Crecencio Mc, MD as PCP - General (Internal Medicine) Telford Nab, RN  as Oncology Nurse Navigator  Indicate any recent Medical Services you may have received from other than [facility] providers in the past year (date may be approximate).     Assessment:   This is a routine wellness examination for Eric Hicks.  Virtual Visit via Telephone Note  I connected with  Eric Hicks on 10/07/21 at 10:30 AM EDT by telephone and verified that I am speaking with the correct person using two identifiers.  Persons participating in the virtual visit: patient/Nurse Health Advisor   I discussed the limitations of performing an evaluation and management service by telehealth.  We continued and completed visit with audio only. Some vital signs may be absent or patient reported.   Hearing/Vision screen Hearing Screening - Comments::  Hearing aid, bilateral  Vision Screening - Comments:: Followed [facility] Wears corrective lenses  Visits every 4 months; drops in use  Glaucoma; drops in use  Cataract extraction, bilateral  They have regular follow up with the ophthalmologist  Dietary issues and exercise activities discussed: Current Exercise Habits: Home exercise routine, Intensity: Mild Healthy diet Good water intake   Goals Addressed               This Visit's Progress     Patient Stated     Follow up with Primary Care Provider (pt-stated)        As needed.        Depression Screen    10/07/2021   10:42 AM 09/02/2021   10:00 AM 03/04/2021   10:13 AM 01/22/2021    1:07 PM 10/06/2020   10:46 AM 09/01/2020   10:06 AM 10/04/2019   10:51 AM  PHQ 2/9 Scores  PHQ - 2 Score 0 0 0 0 0 0 0  PHQ- 9 Score     0 1     Fall Risk    10/07/2021   10:45 AM 09/02/2021   10:00 AM 03/04/2021   10:12 AM 01/22/2021    1:07 PM 10/06/2020   10:50 AM  [place] in the past year? 0 0 _0 Number falls in past yr:   0 0 0  Injury with Fall?   0 0 0  Risk for fall due to :  No Fall Risks History of fall(s) History of fall(s)   Follow up Falls evaluation  completed Falls evaluation completed Falls evaluation completed Falls evaluation completed Falls evaluation completed    [place]: Home free of loose throw rugs in walkways, pet beds, electrical cords, etc? Yes  Adequate lighting in your home to reduce risk of falls? Yes   ASSISTIVE DEVICES UTILIZED TO PREVENT FALLS: Life alert? No  Use of a cane, walker or w/c? No  Grab bars in the bathroom? Yes  Shower chair or bench in shower? No  Elevated toilet seat or a handicapped toilet? No   TIMED UP AND GO: Was the test performed? No .   Cognitive Function: Patient is alert and oriented x3.     09/27/2016    9:36 AM  MMSE - Mini Mental State Exam  Orientation to time 5  Orientation to Place 5  Registration 3  Attention/ Calculation 5  Recall 3  Language- name 2 objects 2  Language- repeat 1  Language- follow 3 step command 3  Language- read & follow direction 1  Write a sentence 1  Copy design 1  Total score 30        10/04/2019   10:52 AM 10/02/2018   10:31 AM 09/28/2017    9:42 AM  6CIT Screen  What Year? 0 points 0 points 0 points  What month? 0 points 0 points 0 points  What time? 0 points 0 points 0 points  Count back from 20  0 points 0 points  Months in reverse 0 points 0 points 0 points  Repeat phrase  0 points 0 points  Total Score  0 points 0 points    Immunizations Immunization History  Administered Date(s) Administered   Influenza, High Dose Seasonal PF 02/07/2016, 02/09/2017,  02/17/2018, 02/21/2019, 02/13/2020, 02/05/2021   Influenza-Unspecified 02/26/2011, 02/13/2014, 02/12/2015   PFIZER Comirnaty(Gray Top)Covid-19 Tri-Sucrose Vaccine 08/14/2020   PFIZER(Purple Top)SARS-COV-2 Vaccination 05/17/2019, 06/07/2019, 03/06/2020   Pfizer Covid-19 Vaccine Bivalent Booster 33yr & up 02/05/2021   Pneumococcal Conjugate-13 11/20/2013   Pneumococcal Polysaccharide-23 11/11/1999, 11/11/2006   Tdap 06/13/2012   Zoster, Live  02/13/2014   Shingrix Completed?: No.    Education has been provided regarding the importance of this vaccine. Patient has been advised to call insurance company to determine out of pocket expense if they have not yet received this vaccine. Advised may also receive vaccine at local pharmacy or Health Dept. Verbalized acceptance and understanding.  Screening Tests Health Maintenance  Topic Date Due   Zoster Vaccines- Shingrix (1 of 2) 01/07/2022 (Originally 08/23/1945)   INFLUENZA VACCINE  11/24/2021   TETANUS/TDAP  06/13/2022   Pneumonia Vaccine 88 Years old  Completed   COVID-19 Vaccine  Completed   HPV VACCINES  Aged Out   Health Maintenance There are no preventive care reminders to display for this patient.  Lung Cancer Screening: (Low Dose CT Chest recommended if Age 86-80years, 30 pack-year currently smoking OR have quit w/in 15years.) does not qualify.   Hepatitis C Screening: does not qualify.  Vision Screening: Recommended annual ophthalmology exams for early detection of glaucoma and other disorders of the eye.  Dental Screening: Recommended annual dental exams for proper oral hygiene  Community Resource Referral / Chronic Care Management: CRR required this visit?  No   CCM required this visit?  No      Plan:   Keep all routine maintenance appointments.   I have personally reviewed and noted the following in the patient's chart:   Medical and social history Use of alcohol, tobacco or illicit drugs  Current medications and supplements including opioid prescriptions. Patient is not currently taking opioid prescriptions. Functional ability and status Nutritional status Physical activity Advanced directives List of other physicians Hospitalizations, surgeries, and ER visits in previous 12 months Vitals Screenings to include cognitive, depression, and falls Referrals and appointments  In addition, I have reviewed and discussed with patient certain preventive  protocols, quality metrics, and best practice recommendations. A written personalized care plan for preventive services as well as general preventive health recommendations were provided to patient.     OBrien-Blaney,  L, LPN   [id number]     I have reviewed the above information and agree with above.   TDeborra Medina MD

## 2021-10-07 NOTE — Patient Instructions (Addendum)
  [name] , Thank you for taking time to come for your Medicare Wellness Visit. I appreciate your ongoing commitment to your health goals. Please review the following plan we discussed and let me know if I can assist you in the future.   These are the goals we discussed:  Goals       Patient Stated     Follow up with Primary Care Provider (pt-stated)      As needed.         This is a list of the screening recommended for you and due dates:  Health Maintenance  Topic Date Due   Zoster (Shingles) Vaccine (1 of 2) 01/07/2022*   Flu Shot  11/24/2021   Tetanus Vaccine  06/13/2022   Pneumonia Vaccine  Completed   COVID-19 Vaccine  Completed   HPV Vaccine  Aged Out  *Topic was postponed. The date shown is not the original due date.

## 2021-10-13 DIAGNOSIS — I10 Essential (primary) hypertension: Secondary | ICD-10-CM | POA: Diagnosis not present

## 2021-10-13 DIAGNOSIS — I482 Chronic atrial fibrillation, unspecified: Secondary | ICD-10-CM | POA: Diagnosis not present

## 2021-10-13 DIAGNOSIS — I420 Dilated cardiomyopathy: Secondary | ICD-10-CM | POA: Diagnosis not present

## 2021-10-14 ENCOUNTER — Ambulatory Visit (INDEPENDENT_AMBULATORY_CARE_PROVIDER_SITE_OTHER): Payer: Medicare Other

## 2021-10-14 DIAGNOSIS — E538 Deficiency of other specified B group vitamins: Secondary | ICD-10-CM

## 2021-10-14 MED ORDER — CYANOCOBALAMIN 1000 MCG/ML IJ SOLN
1000.0000 ug | Freq: Once | INTRAMUSCULAR | Status: AC
  Administered 2021-10-14: 1000 ug via INTRAMUSCULAR

## 2021-10-14 NOTE — Progress Notes (Cosign Needed)
Patient presented for B 12 injection to right deltoid, patient voiced no concerns nor showed any signs of distress during injection. 

## 2021-10-30 ENCOUNTER — Other Ambulatory Visit: Payer: Self-pay | Admitting: Internal Medicine

## 2021-10-30 MED ORDER — FLUTICASONE-SALMETEROL 100-50 MCG/ACT IN AEPB: 1.0000 | INHALATION_SPRAY | Freq: Two times a day (BID) | RESPIRATORY_TRACT | 3 refills | Status: DC

## 2021-11-09 DIAGNOSIS — H34812 Central retinal vein occlusion, left eye, with macular edema: Secondary | ICD-10-CM | POA: Diagnosis not present

## 2021-11-17 ENCOUNTER — Ambulatory Visit (INDEPENDENT_AMBULATORY_CARE_PROVIDER_SITE_OTHER): Payer: Medicare Other

## 2021-11-17 DIAGNOSIS — E538 Deficiency of other specified B group vitamins: Secondary | ICD-10-CM | POA: Diagnosis not present

## 2021-11-17 MED ORDER — CYANOCOBALAMIN 1000 MCG/ML IJ SOLN
1000.0000 ug | Freq: Once | INTRAMUSCULAR | Status: AC
  Administered 2021-11-17: 1000 ug via INTRAMUSCULAR

## 2021-11-17 NOTE — Progress Notes (Signed)
Patient presented for B 12 injection to left deltoid, patient voiced no concerns nor showed any signs of distress during injection. 

## 2021-11-23 DIAGNOSIS — H6981 Other specified disorders of Eustachian tube, right ear: Secondary | ICD-10-CM | POA: Diagnosis not present

## 2021-11-26 DIAGNOSIS — H401121 Primary open-angle glaucoma, left eye, mild stage: Secondary | ICD-10-CM | POA: Diagnosis not present

## 2021-12-07 ENCOUNTER — Encounter: Payer: Self-pay | Admitting: Internal Medicine

## 2021-12-18 ENCOUNTER — Ambulatory Visit (INDEPENDENT_AMBULATORY_CARE_PROVIDER_SITE_OTHER): Payer: Medicare Other

## 2021-12-18 DIAGNOSIS — E538 Deficiency of other specified B group vitamins: Secondary | ICD-10-CM | POA: Diagnosis not present

## 2021-12-18 MED ORDER — CYANOCOBALAMIN 1000 MCG/ML IJ SOLN
1000.0000 ug | Freq: Once | INTRAMUSCULAR | Status: AC
  Administered 2021-12-18: 1000 ug via INTRAMUSCULAR

## 2021-12-18 NOTE — Progress Notes (Signed)
Pt arrived for B12 injection, given in L deltoid. Pt tolerated injection well, showed no signs of distress nor voiced any concerns.  ?

## 2021-12-19 ENCOUNTER — Other Ambulatory Visit: Payer: Self-pay | Admitting: Internal Medicine

## 2021-12-19 DIAGNOSIS — I509 Heart failure, unspecified: Secondary | ICD-10-CM

## 2021-12-21 DIAGNOSIS — H34812 Central retinal vein occlusion, left eye, with macular edema: Secondary | ICD-10-CM | POA: Diagnosis not present

## 2022-01-12 ENCOUNTER — Ambulatory Visit
Admission: RE | Admit: 2022-01-12 | Discharge: 2022-01-12 | Disposition: A | Discharge status: Home / Self Care | Payer: Medicare Other | Source: Ambulatory Visit | Attending: Radiation Oncology | Admitting: Radiation Oncology

## 2022-01-12 DIAGNOSIS — J90 Pleural effusion, not elsewhere classified: Secondary | ICD-10-CM | POA: Diagnosis not present

## 2022-01-12 DIAGNOSIS — R911 Solitary pulmonary nodule: Secondary | ICD-10-CM | POA: Diagnosis not present

## 2022-01-12 DIAGNOSIS — J9811 Atelectasis: Secondary | ICD-10-CM | POA: Diagnosis not present

## 2022-01-12 DIAGNOSIS — I7121 Aneurysm of the ascending aorta, without rupture: Secondary | ICD-10-CM | POA: Diagnosis not present

## 2022-01-12 LAB — POCT I-STAT CREATININE: Creatinine, Ser: 1.8 mg/dL — ABNORMAL HIGH (ref 0.61–1.24)

## 2022-01-12 MED ORDER — IOHEXOL 300 MG/ML  SOLN
75.0000 mL | Freq: Once | INTRAMUSCULAR | Status: AC | PRN
  Administered 2022-01-12: 60 mL via INTRAVENOUS

## 2022-01-15 NOTE — Progress Notes (Unsigned)
[place]  [phone] [phone]) [phone]  ID: Eric Hicks OB: [birth year]  MR#: [id number]  YKZ#:[id number]  Patient Care Team: Crecencio Mc, MD as PCP - General (Internal Medicine) Telford Nab, RN as Oncology Nurse Navigator  CHIEF COMPLAINT: Right upper lobe lung mass.  INTERVAL HISTORY: Patient returns to clinic today for routine evaluation and discussion of his CT scan results.  Despite not having a biopsy, patient underwent XRT in April and May 2023 with a highly suspicious lung lesion.  He currently feels well and is asymptomatic.  He remains active and independent.  He has no neurologic complaints.  He denies any recent fevers or illnesses.  He has a good appetite and denies weight loss.  He has no chest pain, shortness of breath, cough, or hemoptysis.  He denies any nausea, vomiting, constipation, or diarrhea.  He has no urinary complaints.  Patient offers no specific complaints today.  REVIEW OF SYSTEMS:   Review of Systems  Constitutional: Negative.  Negative for fever, malaise/fatigue and weight loss.  Respiratory: Negative.  Negative for cough, hemoptysis and shortness of breath.   Cardiovascular: Negative.  Negative for chest pain and leg swelling.  Gastrointestinal: Negative.  Negative for abdominal pain.  Genitourinary: Negative.  Negative for dysuria.  Musculoskeletal: Negative.  Negative for back pain.  Skin: Negative.  Negative for rash.  Neurological: Negative.  Negative for dizziness, focal weakness, weakness and headaches.  Psychiatric/Behavioral: Negative.  The patient is not nervous/anxious.     As per HPI. Otherwise, a complete review of systems is negative.  PAST MEDICAL HISTORY: Past Medical History:  Diagnosis Date   BPH (benign prostatic hyperplasia)    managed by Olena Heckle   COPD (chronic obstructive pulmonary disease) ([place])    Elevated PSA, between 10 and less than 20 ng/ml    Olena Heckle, watchful waiting    Hypertension    Osteoporosis    by DEXA    PAST SURGICAL HISTORY: Past Surgical History:  Procedure Laterality Date   TYMPANOSTOMY TUBE PLACEMENT  2017    FAMILY HISTORY: Family History  Problem Relation Age of Onset   Diabetes Mother    Hypertension Father    Cancer Brother 1       multiple myeloma    ADVANCED DIRECTIVES (Y/N):  N  HEALTH MAINTENANCE: Social History   Tobacco Use   Smoking status: Former   Smokeless tobacco: Never  Scientific laboratory technician Use: Never used  Substance Use Topics   Alcohol use: No     Colonoscopy:  PAP:  Bone density:  Lipid panel:  Allergies  Allergen Reactions   Tylenol [Acetaminophen] Rash    Current Outpatient Medications  Medication Sig Dispense Refill   Aflibercept (EYLEA IO) Inject into the eye.     AMBULATORY NON FORMULARY MEDICATION Joint Advantage Gold 2 tablets daily     atorvastatin (LIPITOR) 20 MG tablet TAKE 1 TABLET BY MOUTH EVERY OTHER DAY 45 tablet 3   calcium carbonate (OS-CAL) 600 MG TABS Take 600 mg by mouth daily with breakfast.      carvedilol (COREG) 6.25 MG tablet Take 6.25 mg by mouth 2 (two) times daily.     dorzolamide-timolol (COSOPT) 22.3-6.8 MG/ML ophthalmic solution TAKE 1 DROP(S) IN RIGHT EYE 2 TIMES A DAY  5   doxazosin (CARDURA) 1 MG tablet TAKE 1 TABLET BY MOUTH EVERY DAY 90 tablet 1   fluticasone-salmeterol (ADVAIR) 100-50 MCG/ACT AEPB Inhale 1 puff into the lungs 2 (two) times daily.  60 each 3   furosemide (LASIX) 20 MG tablet TAKE 2 TABLETS BY MOUTH DAILY AS NEEDED 180 tablet 1   latanoprost (XALATAN) 0.005 % ophthalmic solution Place 1 drop into both eyes at bedtime.  5   leuprolide, 6 Month, (ELIGARD) 45 MG injection Inject 45 mg into the skin every 6 (six) months.     Vitamin D, Cholecalciferol, 25 MCG (1000 UT) CAPS Take 1 capsule by mouth daily.     No current facility-administered medications for this visit.    OBJECTIVE: Vitals:   01/19/22 1122  BP: (!) 182/92  Pulse: 62  Resp:  (!) 24  Temp: (!) 97.2 F (36.2 C)     Body mass index is 25.1 kg/m.    ECOG FS:0 - Asymptomatic  General: Well-developed, well-nourished, no acute distress. Eyes: Pink conjunctiva, anicteric sclera. HEENT: Normocephalic, moist mucous membranes. Lungs: No audible wheezing or coughing. Heart: Regular rate and rhythm. Abdomen: Soft, nontender, no obvious distention. Musculoskeletal: No edema, cyanosis, or clubbing. Neuro: Alert, answering all questions appropriately. Cranial nerves grossly intact. Skin: No rashes or petechiae noted. Psych: Normal affect.   LAB RESULTS:  Lab Results  Component Value Date   NA 144 09/02/2021   K 4.1 09/02/2021   CL 106 09/02/2021   CO2 32 09/02/2021   GLUCOSE 91 09/02/2021   BUN 24 (H) 09/02/2021   CREATININE 1.80 (H) 01/12/2022   CALCIUM 9.3 09/02/2021   PROT 5.8 (L) 09/02/2021   ALBUMIN 3.9 09/02/2021   AST 18 09/02/2021   ALT 10 09/02/2021   ALKPHOS 67 09/02/2021   BILITOT 0.8 09/02/2021   GFRNONAA 35 (L) 11/12/2011   GFRAA 40 (L) 11/12/2011    Lab Results  Component Value Date   WBC 5.9 08/30/2019   NEUTROABS 3.5 08/30/2019   HGB 13.2 08/30/2019   HCT 39.1 08/30/2019   MCV 95.2 08/30/2019   PLT 187.0 08/30/2019     STUDIES: CT Chest W Contrast  Result Date: 01/13/2022 CLINICAL DATA:  Non-small cell lung cancer, assess treatment response. Radiation therapy completed April 2023. Prostate cancer. * Tracking Code: BO * EXAM: CT CHEST WITH CONTRAST TECHNIQUE: Multidetector CT imaging of the chest was performed during intravenous contrast administration. RADIATION DOSE REDUCTION: This exam was performed according to the departmental dose-optimization program which includes automated exposure control, adjustment of the mA and/or kV according to patient size and/or use of iterative reconstruction technique. CONTRAST:  51m OMNIPAQUE IOHEXOL 300 MG/ML  SOLN COMPARISON:  07/13/2021. FINDINGS: Cardiovascular: Atherosclerotic calcification  of the aorta, aortic valve and coronary arteries. Enlarged pulmonic trunk and heart. Ascending aorta measures up to 4.7 cm (4/48). No pericardial effusion. Mediastinum/Nodes: No pathologically enlarged mediastinal, hilar or axillary lymph nodes. Esophagus is grossly unremarkable. Lungs/Pleura: New nodular and masslike areas of consolidation with surrounding ground-glass in the apical segment right upper lobe. Previously measured apical segment right upper lobe nodule is now obscured. New clustered peribronchovascular nodularity in the anterior segment right upper lobe and subpleural nodularity with probable mucoid impaction in the right middle lobe. Calcified granulomas. Small right pleural effusion. Minimal dependent atelectasis in lingula. Mild volume loss in posteromedial left lower lobe, adjacent to an elevated left hemidiaphragm. Trace left pleural fluid. Airway is unremarkable. Upper Abdomen: Visualized portions of the liver, gallbladder and adrenal glands are unremarkable. Stones in the kidneys bilaterally. Low-attenuation lesions in the kidneys measure up to 1.9 cm on the right. No specific follow-up necessary. Visualized portions of the spleen, pancreas, stomach and bowel are unremarkable with the  exception of a small hiatal hernia. No upper abdominal adenopathy. Musculoskeletal: Degenerative changes in the spine. Old sternal fracture. L2 compression fracture, unchanged. Bifid anterior fourth rib. No worrisome lytic or sclerotic lesions. IMPRESSION: 1. New areas of nodular and masslike consolidation in the right upper lobe with surrounding ground-glass, obscuring a previously seen right upper lobe nodule. Findings are indicative of radiation therapy. 2. New clustered peribronchovascular nodularity in the anterior segment right upper lobe and new mucoid impaction with subpleural nodularity in the right middle lobe, possibly infectious/inflammatory in etiology. Recommend attention on follow-up. 3. Bilateral  pleural effusions, small on the right and trace on the left. 4. 4.7 cm ascending aortic aneurysm. Ascending thoracic aortic aneurysm. Recommend semi-annual imaging followup by CTA or MRA and referral to cardiothoracic surgery if not already obtained. This recommendation follows 2010 ACCF/AHA/AATS/ACR/ASA/SCA/SCAI/SIR/STS/SVM Guidelines for the Diagnosis and Management of Patients With Thoracic Aortic Disease. Circulation. 2010; 121: V893-Y101. Aortic aneurysm NOS (ICD10-I71.9). 5. Bilateral renal stones. 6. Aortic atherosclerosis (ICD10-I70.0). Coronary artery calcification. 7. Enlarged pulmonic trunk, indicative of pulmonary arterial hypertension. Electronically Signed   By: Lorin Picket M.D.   On: 01/13/2022 14:18     ASSESSMENT: Right upper lobe lung mass.  PLAN:    1. Right upper lobe lung mass: CT scan results from January 09, 2021 reviewed independently with essentially unchanged lesion of right upper lobe, although the lesion had been noted to increase in size since 2020.  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  Patient elected to forego biopsy and undergo radiation treatments which completed in approximately May 2023.  Most recent CT scan on January 13, 2022 reviewed independently and reported as above with no obvious evidence of recurrent or progressive disease.  No intervention is needed at this time.  Return to clinic in 6 months with repeat imaging and further evaluation.   2.  Hypertension: Patient blood pressure remains significantly elevated.  Continue monitoring and treatment per primary care.    Patient expressed understanding and was in agreement with this plan. He also understands that He can call clinic at any time with any questions, concerns, or complaints.    Cancer Staging  No matching staging information was found for the patient.  Lloyd Huger, MD   01/19/2022 2:11 PM

## 2022-01-19 ENCOUNTER — Ambulatory Visit
Admission: RE | Admit: 2022-01-19 | Discharge: 2022-01-19 | Disposition: A | Discharge status: Home / Self Care | Payer: Medicare Other | Source: Ambulatory Visit | Attending: Radiation Oncology | Admitting: Radiation Oncology

## 2022-01-19 ENCOUNTER — Encounter: Payer: Self-pay | Admitting: Radiation Oncology

## 2022-01-19 ENCOUNTER — Inpatient Hospital Stay: Payer: Medicare Other | Attending: Oncology | Admitting: Oncology

## 2022-01-19 ENCOUNTER — Other Ambulatory Visit: Payer: Self-pay | Admitting: *Deleted

## 2022-01-19 ENCOUNTER — Encounter: Payer: Self-pay | Admitting: Oncology

## 2022-01-19 VITALS — BP 182/92 | HR 62 | Temp 97.2°F | Resp 24 | Wt 170.4 lb

## 2022-01-19 VITALS — BP 182/92 | HR 62 | Temp 97.2°F | Resp 24 | Wt 170.0 lb

## 2022-01-19 DIAGNOSIS — Z923 Personal history of irradiation: Secondary | ICD-10-CM | POA: Insufficient documentation

## 2022-01-19 DIAGNOSIS — R911 Solitary pulmonary nodule: Secondary | ICD-10-CM

## 2022-01-19 DIAGNOSIS — I10 Essential (primary) hypertension: Secondary | ICD-10-CM | POA: Insufficient documentation

## 2022-01-19 DIAGNOSIS — Z87891 Personal history of nicotine dependence: Secondary | ICD-10-CM | POA: Diagnosis not present

## 2022-01-19 DIAGNOSIS — C3411 Malignant neoplasm of upper lobe, right bronchus or lung: Secondary | ICD-10-CM | POA: Insufficient documentation

## 2022-01-19 NOTE — Progress Notes (Signed)
Radiation Oncology Follow up Note  Name: CALAB SACHSE   Date:   01/19/2022 MRN:  [id number] DOB: [birth year]    This 88 y.o. male presents to the clinic today for 37-month follow-up status post SBRT for stage I non-small cell lung cancer of the right upper lobe.  REFERRING PROVIDER: Crecencio Mc, MD  HPI: Patient is a 88 year old male now out 4 months having a pleated SBRT to his right upper lobe for stage I non-small cell lung cancer.  Seen today in routine follow-up he is doing well.  He specifically denies s cough hemoptysis chest tightness or any change in his pulmonary status..  He had a recent CT scan showing masslike consolidation right upper lobe consistent with radiation changes.  He has also a new cluster of peribronchial vascular nodularity anterior segment right upper lobe and new mucoid impaction possibly infectious inflammatory in nature attention to follow-up is recommended.  COMPLICATIONS OF TREATMENT: none  FOLLOW UP COMPLIANCE: keeps appointments   PHYSICAL EXAM:  BP (!) 182/92 (BP Location: Left Arm, Patient Position: Sitting, Cuff Size: Normal)   Pulse 62   Temp (!) 97.2 F (36.2 C) (Tympanic)   Resp (!) 24   Wt 170 lb 6.4 oz (77.3 kg)   BMI 25.16 kg/m  Well-developed well-nourished patient in NAD. HEENT reveals PERLA, EOMI, discs not visualized.  Oral cavity is clear. No oral mucosal lesions are identified. Neck is clear without evidence of cervical or supraclavicular adenopathy. Lungs are clear to A&P. Cardiac examination is essentially unremarkable with regular rate and rhythm without murmur rub or thrill. Abdomen is benign with no organomegaly or masses noted. Motor sensory and DTR levels are equal and symmetric in the upper and lower extremities. Cranial nerves II through XII are grossly intact. Proprioception is intact. No peripheral adenopathy or edema is identified. No motor or sensory levels are noted. Crude visual fields are within normal range.  RADIOLOGY  RESULTS: CT scans reviewed compatible with above-stated findings  PLAN: Present time patient is doing well CT scan shows significant changes consistent with radiation.  We will continue to follow these.  Bronchovascular nodularity anterior segment with follow-up CT scan in 6 months.  Patient knows to call with any concerns.  I would like to take this opportunity to thank you for allowing me to participate in the care of your patient.Noreene Filbert, MD

## 2022-01-20 ENCOUNTER — Ambulatory Visit (INDEPENDENT_AMBULATORY_CARE_PROVIDER_SITE_OTHER): Payer: Medicare Other

## 2022-01-20 DIAGNOSIS — E538 Deficiency of other specified B group vitamins: Secondary | ICD-10-CM | POA: Diagnosis not present

## 2022-01-20 MED ORDER — CYANOCOBALAMIN 1000 MCG/ML IJ SOLN
1000.0000 ug | Freq: Once | INTRAMUSCULAR | Status: AC
  Administered 2022-01-20: 1000 ug via INTRAMUSCULAR

## 2022-01-20 NOTE — Progress Notes (Signed)
Pt arrived for B12 injection, given in L deltoid. Pt tolerated injection well, showed no signs of distress nor voiced any concerns.  ?

## 2022-01-23 IMAGING — CT CT CHEST W/O CM
2 of 4 series · 15 of 36 positions shown, 18 images · non-contrast
Comparison: 03/31/2020.

CLINICAL DATA: Non-small cell lung cancer. Shortness of breath with
exertion for 1 year.

EXAM:
CT CHEST WITHOUT CONTRAST
TECHNIQUE: Multidetector CT imaging of the chest was performed following the
standard protocol without IV contrast.

[Series 2: chest 2.00 · axial · 0.62mm/px · z∈[-1225,-931]mm · 12 of 175 slices shown, 15 images]
[im 14/175  mediastinal]
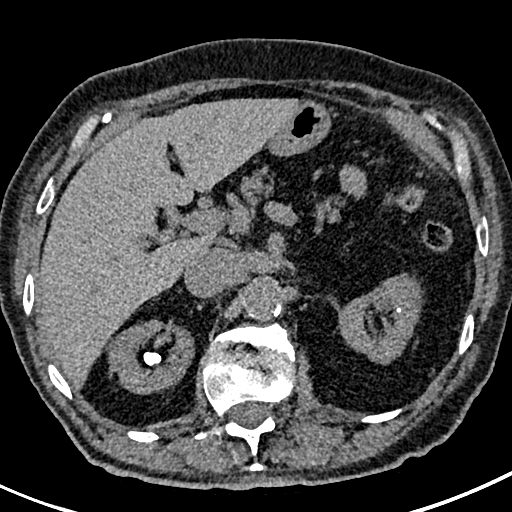
[im 14/175  lung]
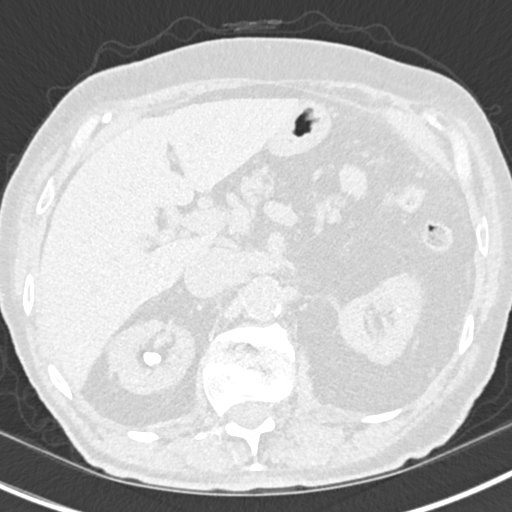
[im 27/175  lung]
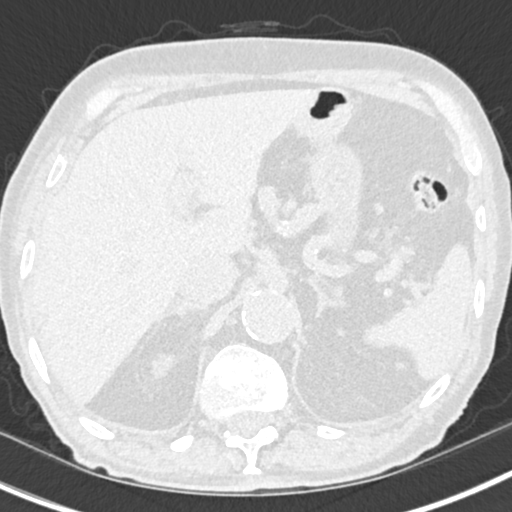
[im 41/175  lung]
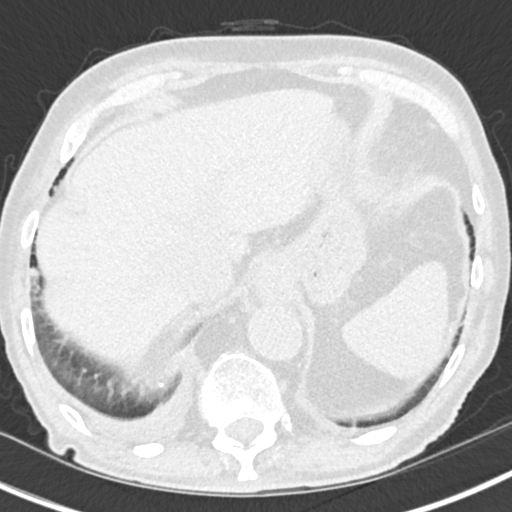
[im 54/175  lung]
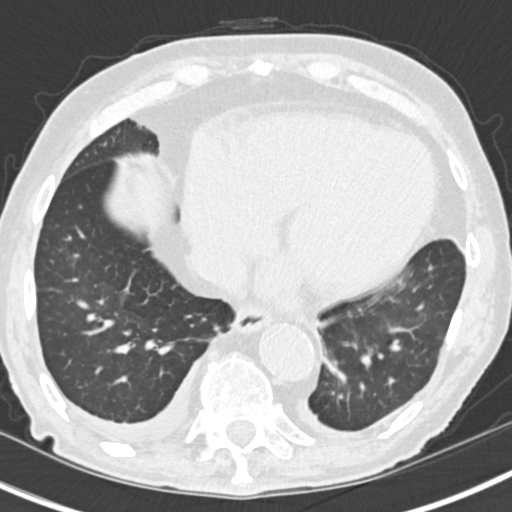
[im 67/175  mediastinal]
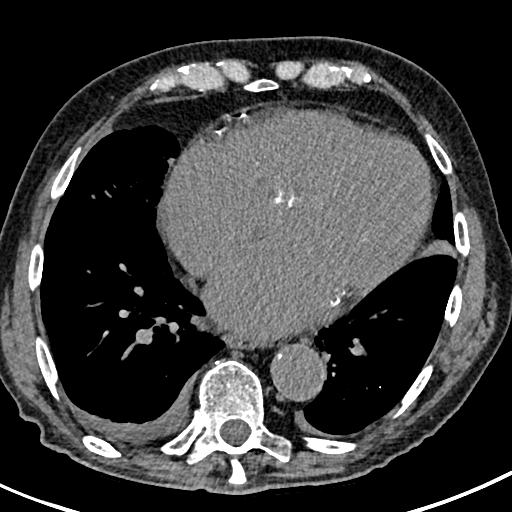
[im 67/175  lung]
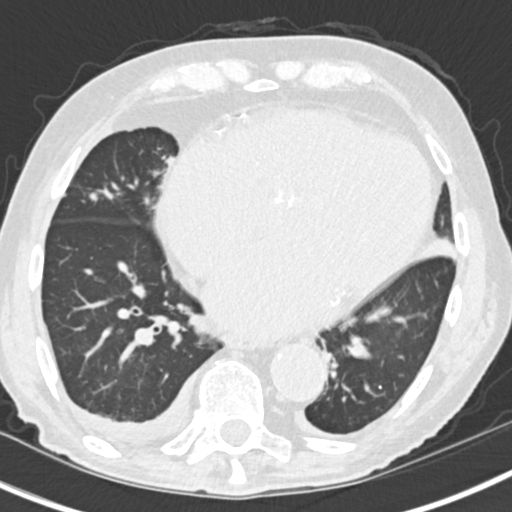
[im 81/175  lung]
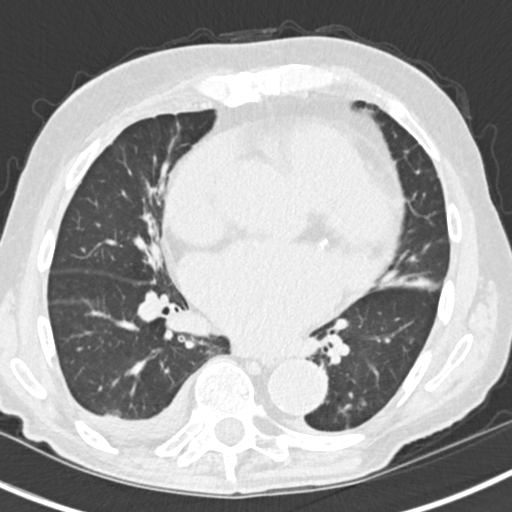
[im 94/175  lung]
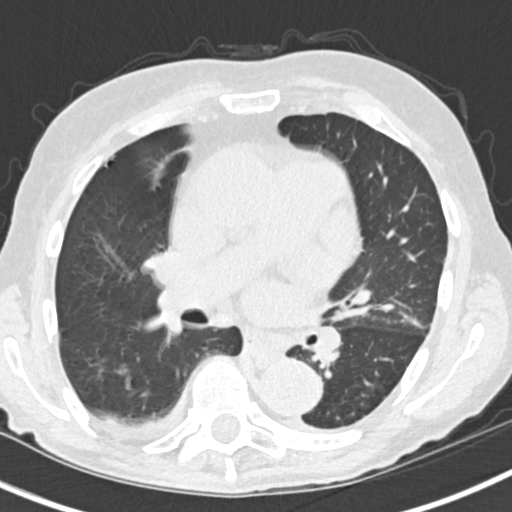
[im 108/175  lung]
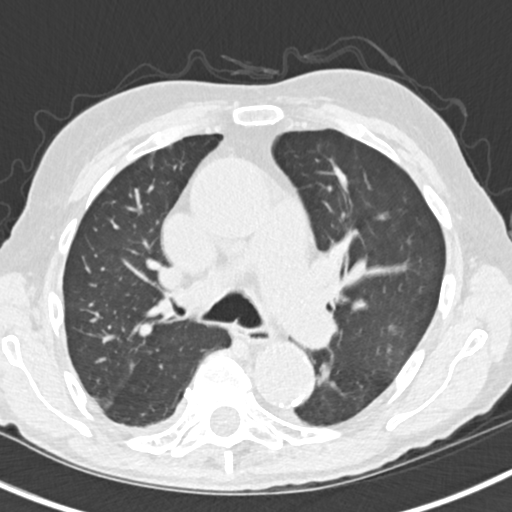
[im 121/175  mediastinal]
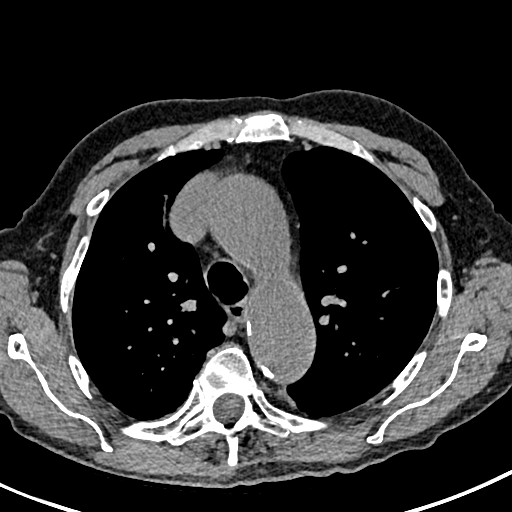
[im 121/175  lung]
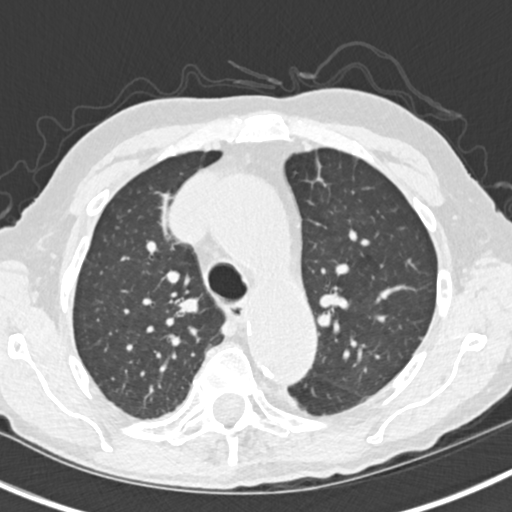
[im 134/175  lung]
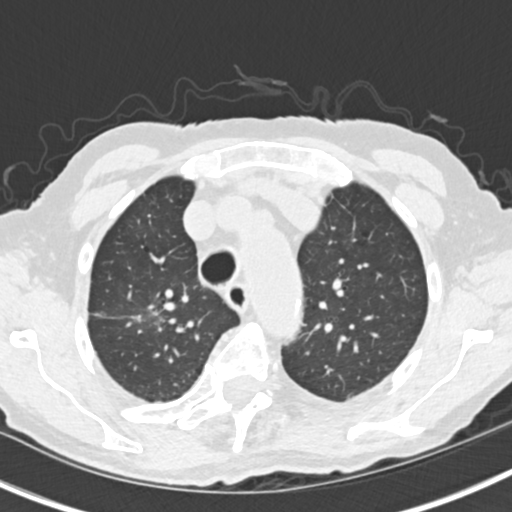
[im 148/175  lung]
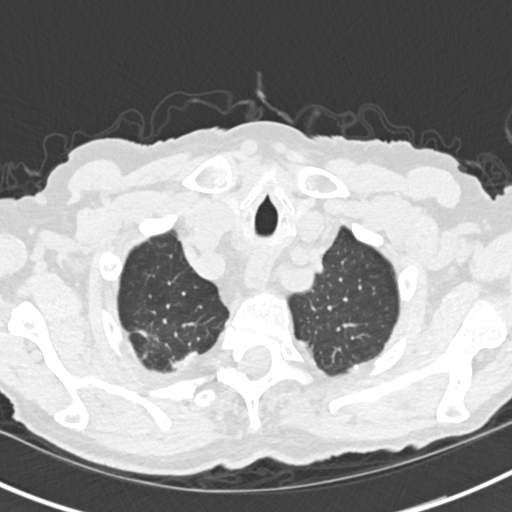
[im 161/175  lung]
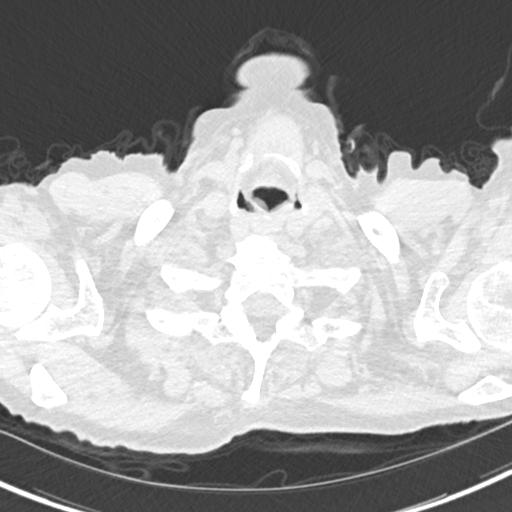

[Series 5: coronals chest 2.00 cor · coronal · 0.62mm/px · 3 of 143 slices shown]
[im 29/143  lung]
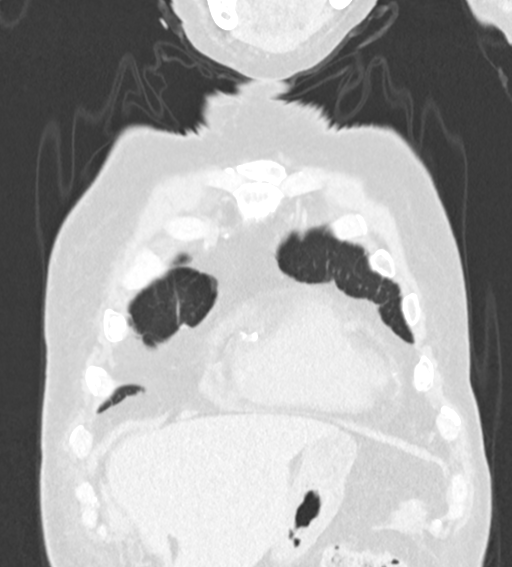
[im 57/143  lung]
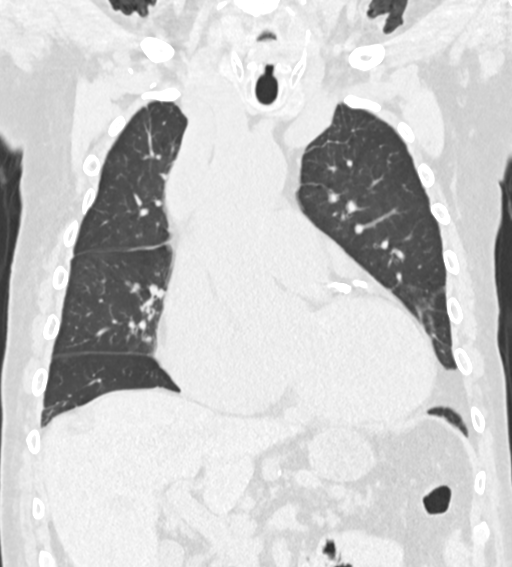
[im 86/143  lung]
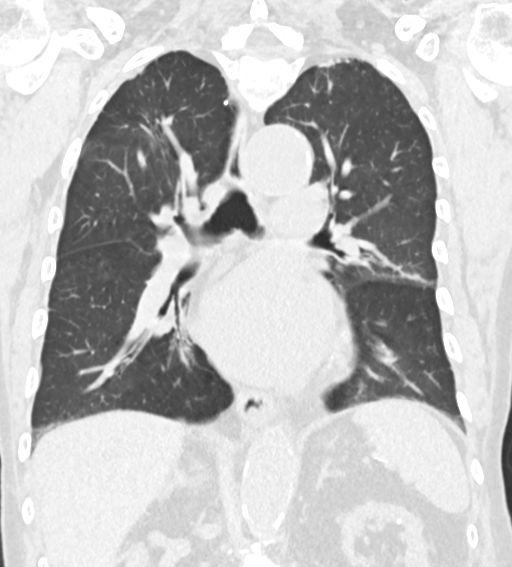

[15 of 36 positions shown; findings below may reference images not displayed]

FINDINGS: Cardiovascular: Atherosclerotic calcification of the aorta, aortic
valve and coronary arteries. Ascending aorta measures 4.8 cm.
Pulmonic trunk and heart are enlarged. No pericardial effusion.

Mediastinum/Nodes: No pathologically enlarged mediastinal or
axillary lymph nodes. Hilar regions are difficult to evaluate
without IV contrast. Calcified subcarinal lymph node. Mid
periesophageal lymph nodes measure up to approximately 10 mm,
unchanged. Esophagus is grossly unremarkable.

Lungs/Pleura: Image quality is degraded by respiratory motion.
Spiculated nodule in the apical segment right upper lobe is stable
in size, measuring 1.6 x 2.4 cm, with slight differences in
measurement technique are considered. Mild peribronchovascular
ground-glass and basilar septal thickening. 5 mm right middle lobe
nodule (3/109) is new. Chronic volume loss in the medial right
middle lobe and lingula. Calcified granulomas. Small bilateral
pleural effusions. Minimal debris in the trachea.

Upper Abdomen: Visualized portions of the liver, gallbladder and
adrenal glands are unremarkable. Stones are seen in the kidneys.
Visualized portions of the spleen, pancreas, stomach and bowel are
unremarkable with exception of a small hiatal hernia.

Musculoskeletal: Degenerative changes in the spine. L2 compression
fracture is old. Old sternal fracture.
IMPRESSION: 1. Spiculated right upper lobe nodule is stable and remains
worrisome for low-grade adenocarcinoma.
2. New 5 mm right middle lobe nodule. Continued attention on
follow-up exams is warranted.
3. Congestive heart failure.
4. Bilateral renal stones.
5. Aortic atherosclerosis (488X8-CLQ.Q). Coronary artery
calcification.
6. Ascending aortic aneurysm, stable. Ascending thoracic aortic
aneurysm. Recommend semi-annual imaging followup by CTA or MRA and
referral to cardiothoracic surgery if not already obtained. This
recommendation follows 8404
ACCF/AHA/AATS/ACR/ASA/SCA/ERI ERAJN/LESLY/SHALLEY/SHASHIKUMAR Guidelines for the
Diagnosis and Management of Patients With Thoracic Aortic Disease.
Circulation. 8404; 121: E266-e369. Aortic aneurysm NOS
(488X8-VDW.J).
7. Enlarged pulmonic trunk, indicative of pulmonary arterial
hypertension.

## 2022-01-30 ENCOUNTER — Other Ambulatory Visit: Payer: Self-pay | Admitting: Internal Medicine

## 2022-02-11 DIAGNOSIS — I420 Dilated cardiomyopathy: Secondary | ICD-10-CM | POA: Diagnosis not present

## 2022-02-11 DIAGNOSIS — Z23 Encounter for immunization: Secondary | ICD-10-CM | POA: Diagnosis not present

## 2022-02-11 DIAGNOSIS — R0602 Shortness of breath: Secondary | ICD-10-CM | POA: Diagnosis not present

## 2022-02-11 DIAGNOSIS — I482 Chronic atrial fibrillation, unspecified: Secondary | ICD-10-CM | POA: Diagnosis not present

## 2022-02-11 DIAGNOSIS — I10 Essential (primary) hypertension: Secondary | ICD-10-CM | POA: Diagnosis not present

## 2022-02-11 DIAGNOSIS — J439 Emphysema, unspecified: Secondary | ICD-10-CM | POA: Diagnosis not present

## 2022-02-11 DIAGNOSIS — I7121 Aneurysm of the ascending aorta, without rupture: Secondary | ICD-10-CM | POA: Diagnosis not present

## 2022-02-11 DIAGNOSIS — I480 Paroxysmal atrial fibrillation: Secondary | ICD-10-CM | POA: Diagnosis not present

## 2022-02-11 DIAGNOSIS — N182 Chronic kidney disease, stage 2 (mild): Secondary | ICD-10-CM | POA: Diagnosis not present

## 2022-02-16 DIAGNOSIS — H34812 Central retinal vein occlusion, left eye, with macular edema: Secondary | ICD-10-CM | POA: Diagnosis not present

## 2022-02-19 ENCOUNTER — Ambulatory Visit (INDEPENDENT_AMBULATORY_CARE_PROVIDER_SITE_OTHER): Payer: Medicare Other

## 2022-02-19 DIAGNOSIS — E538 Deficiency of other specified B group vitamins: Secondary | ICD-10-CM | POA: Diagnosis not present

## 2022-02-19 MED ORDER — CYANOCOBALAMIN 1000 MCG/ML IJ SOLN
1000.0000 ug | Freq: Once | INTRAMUSCULAR | Status: AC
  Administered 2022-02-19: 1000 ug via INTRAMUSCULAR

## 2022-02-19 NOTE — Progress Notes (Signed)
Pt arrived for B12 injection, given in L deltoid. Pt tolerated injection well, showed no signs of distress nor voiced any concerns.  ?

## 2022-02-22 ENCOUNTER — Encounter (INDEPENDENT_AMBULATORY_CARE_PROVIDER_SITE_OTHER): Payer: Self-pay

## 2022-03-05 ENCOUNTER — Ambulatory Visit: Payer: Medicare Other | Admitting: Internal Medicine

## 2022-03-08 ENCOUNTER — Ambulatory Visit (INDEPENDENT_AMBULATORY_CARE_PROVIDER_SITE_OTHER): Payer: Medicare Other | Admitting: Internal Medicine

## 2022-03-08 ENCOUNTER — Encounter: Payer: Self-pay | Admitting: Internal Medicine

## 2022-03-08 VITALS — BP 190/90 | HR 74 | Temp 97.5°F | Ht 69.0 in | Wt 170.0 lb

## 2022-03-08 DIAGNOSIS — I480 Paroxysmal atrial fibrillation: Secondary | ICD-10-CM

## 2022-03-08 DIAGNOSIS — I700 Atherosclerosis of aorta: Secondary | ICD-10-CM

## 2022-03-08 DIAGNOSIS — E785 Hyperlipidemia, unspecified: Secondary | ICD-10-CM

## 2022-03-08 DIAGNOSIS — N182 Chronic kidney disease, stage 2 (mild): Secondary | ICD-10-CM

## 2022-03-08 DIAGNOSIS — R7301 Impaired fasting glucose: Secondary | ICD-10-CM

## 2022-03-08 DIAGNOSIS — Z79899 Other long term (current) drug therapy: Secondary | ICD-10-CM

## 2022-03-08 DIAGNOSIS — C61 Malignant neoplasm of prostate: Secondary | ICD-10-CM

## 2022-03-08 DIAGNOSIS — I10 Essential (primary) hypertension: Secondary | ICD-10-CM | POA: Diagnosis not present

## 2022-03-08 LAB — CBC WITH DIFFERENTIAL/PLATELET
Basophils Absolute: 0.1 10*3/uL (ref 0.0–0.1)
Basophils Relative: 1.1 % (ref 0.0–3.0)
Eosinophils Absolute: 0.2 10*3/uL (ref 0.0–0.7)
Eosinophils Relative: 4.6 % (ref 0.0–5.0)
HCT: 37.9 % — ABNORMAL LOW (ref 39.0–52.0)
Hemoglobin: 12.6 g/dL — ABNORMAL LOW (ref 13.0–17.0)
Lymphocytes Relative: 18.9 % (ref 12.0–46.0)
Lymphs Abs: 0.9 10*3/uL (ref 0.7–4.0)
MCHC: 33.3 g/dL (ref 30.0–36.0)
MCV: 98.1 fl (ref 78.0–100.0)
Monocytes Absolute: 0.5 10*3/uL (ref 0.1–1.0)
Monocytes Relative: 11 % (ref 3.0–12.0)
Neutro Abs: 3.2 10*3/uL (ref 1.4–7.7)
Neutrophils Relative %: 64.4 % (ref 43.0–77.0)
Platelets: 141 10*3/uL — ABNORMAL LOW (ref 150.0–400.0)
RBC: 3.86 Mil/uL — ABNORMAL LOW (ref 4.22–5.81)
RDW: 14.6 % (ref 11.5–15.5)
WBC: 5 10*3/uL (ref 4.0–10.5)

## 2022-03-08 LAB — COMPREHENSIVE METABOLIC PANEL
ALT: 11 U/L (ref 0–53)
AST: 17 U/L (ref 0–37)
Albumin: 3.8 g/dL (ref 3.5–5.2)
Alkaline Phosphatase: 67 U/L (ref 39–117)
BUN: 27 mg/dL — ABNORMAL HIGH (ref 6–23)
CO2: 31 mEq/L (ref 19–32)
Calcium: 9.2 mg/dL (ref 8.4–10.5)
Chloride: 105 mEq/L (ref 96–112)
Creatinine, Ser: 1.34 mg/dL (ref 0.40–1.50)
GFR: 45.65 mL/min — ABNORMAL LOW (ref >60.00)
Glucose, Bld: 89 mg/dL (ref 70–99)
Potassium: 4.1 mEq/L (ref 3.5–5.1)
Sodium: 143 mEq/L (ref 135–145)
Total Bilirubin: 0.8 mg/dL (ref 0.2–1.2)
Total Protein: 5.8 g/dL — ABNORMAL LOW (ref 6.0–8.3)

## 2022-03-08 LAB — LIPID PANEL
Cholesterol: 107 mg/dL (ref 0–200)
HDL: 53.3 mg/dL (ref >39.00)
LDL Cholesterol: 36 mg/dL (ref 0–99)
NonHDL: 53.34
Total CHOL/HDL Ratio: 2
Triglycerides: 85 mg/dL (ref 0.0–149.0)
VLDL: 17 mg/dL (ref 0.0–40.0)

## 2022-03-08 LAB — MICROALBUMIN / CREATININE URINE RATIO
Creatinine,U: 70.7 mg/dL
Microalb Creat Ratio: 5.4 mg/g (ref 0.0–30.0)
Microalb, Ur: 3.8 mg/dL — ABNORMAL HIGH (ref 0.0–1.9)

## 2022-03-08 LAB — LDL CHOLESTEROL, DIRECT: Direct LDL: 32 mg/dL

## 2022-03-08 LAB — HEMOGLOBIN A1C: Hgb A1c MFr Bld: 6.4 % (ref 4.6–6.5)

## 2022-03-08 LAB — TSH: TSH: 2.68 u[IU]/mL (ref 0.35–5.50)

## 2022-03-08 NOTE — Assessment & Plan Note (Signed)
GFR has improved  Seeing [facility] nephrologist once annually.   Lab Results  Component Value Date   CREATININE 1.80 (H) 01/12/2022

## 2022-03-08 NOTE — Assessment & Plan Note (Signed)
Not at goal based on office readings  But  His Home readings have been reviewed and continue to remain at goal of 120/70  or lower.

## 2022-03-08 NOTE — Assessment & Plan Note (Signed)
Not anticoagulated due to age and history of falls.  (Discussed  plan with paraschos).

## 2022-03-08 NOTE — Assessment & Plan Note (Signed)
Incidental finding on prior CT chest.  He is tolerating atorvastatin

## 2022-03-08 NOTE — Patient Instructions (Addendum)
Advair and Wixela are the SAME DRUGS.  The lowest dose is All you need currently  to keep you  out of trouble.   CONTINUE FUROSEMIDE AS YOU ARE DOING .

## 2022-03-08 NOTE — Progress Notes (Signed)
Subjective:  Patient ID: JEMARIO POITRAS, male    DOB: Jan 25, 1927  Age: 88 y.o. MRN: [id number]  CC: The primary encounter diagnosis was Primary hypertension. Diagnoses of Hyperlipidemia, unspecified hyperlipidemia type, Impaired fasting glucose, Long-term use of high-risk medication, White coat syndrome with diagnosis of hypertension, Paroxysmal atrial fibrillation (HCC), Prostate cancer ([place]), Aortic arch atherosclerosis ([place]), and CKD (chronic kidney disease), stage II were also pertinent to this visit.   HPI Young Mulvey Fiorillo presents for  Chief Complaint  Patient presents with   Follow-up    6 month follow up    1) White coat  hypertension:  taking furosemide, carvedilol,  and home BP readings from the past  6 month were reviewed   95% were > 130/80  2) CAD:  saw Miquel Dunn last month.  No changes to meds.  Changed his OV to every 4 months.  They discussed anticoagulation and decided against it   3) having trouble walking due to balance issues.  Had a fall on Oct 25 while in [place] .  Did not hurt himself.  Uses a cane intermittently . Wants to have PT for balance. No falls during yardwork 9pruning,  riding lawnmower)  4)  lung mass suspicious for CA.  Seeing oncology every 6 months after completing 5 rounds of XRT. Nodule either gone or obscured by radiation sclerosis  5)  COPD:  managed with Wixela or advair,  whichever is covered by his insurance .  Notes that advair dose was reduced recently.  Denies dyspnea ,  wheezing and cough   6) Prostate CA   recent change in therapy prescribed by Dr Valma Cava cost patient [phone]  OOP;  hei si frustrated since her had has no changin in suppressed PGA  for over one year.     Outpatient Medications Prior to Visit  Medication Sig Dispense Refill   ADVAIR DISKUS 100-50 MCG/ACT AEPB INHALE 1 PUFF INTO THE LUNGS TWICE A DAY 180 each 1   Aflibercept (EYLEA IO) Inject into the eye.     AMBULATORY NON FORMULARY MEDICATION Joint Advantage Gold 2 tablets  daily     atorvastatin (LIPITOR) 20 MG tablet TAKE 1 TABLET BY MOUTH EVERY OTHER DAY 45 tablet 3   calcium carbonate (OS-CAL) 600 MG TABS Take 600 mg by mouth daily with breakfast.      carvedilol (COREG) 6.25 MG tablet Take 6.25 mg by mouth 2 (two) times daily.     dorzolamide-timolol (COSOPT) 22.3-6.8 MG/ML ophthalmic solution TAKE 1 DROP(S) IN RIGHT EYE 2 TIMES A DAY  5   doxazosin (CARDURA) 1 MG tablet TAKE 1 TABLET BY MOUTH EVERY DAY 90 tablet 1   furosemide (LASIX) 20 MG tablet TAKE 2 TABLETS BY MOUTH DAILY AS NEEDED 180 tablet 1   latanoprost (XALATAN) 0.005 % ophthalmic solution Place 1 drop into both eyes at bedtime.  5   leuprolide, 6 Month, (ELIGARD) 45 MG injection Inject 45 mg into the skin every 6 (six) months.     Vitamin D, Cholecalciferol, 25 MCG (1000 UT) CAPS Take 1 capsule by mouth daily.     No facility-administered medications prior to visit.    Review of Systems;  Patient denies headache, fevers, malaise, unintentional weight loss, skin rash, eye pain, sinus congestion and sinus pain, sore throat, dysphagia,  hemoptysis , cough, dyspnea, wheezing, chest pain, palpitations, orthopnea, edema, abdominal pain, nausea, melena, diarrhea, constipation, flank pain, dysuria, hematuria, urinary  Frequency, nocturia, numbness, tingling, seizures,  Focal weakness, Loss of  consciousness,  Tremor, insomnia, depression, anxiety, and suicidal ideation.      Objective:  BP (!) 190/90 (BP Location: Left Arm, Patient Position: Sitting, Cuff Size: Normal)   Pulse 74   Temp (!) 97.5 F (36.4 C) (Oral)   Ht _0  (1.753 m)   Wt 170 lb (77.1 kg)   SpO2 96%   BMI 25.10 kg/m   BP Readings from Last 3 Encounters:  03/08/22 (!) 190/90  01/19/22 (!) 182/92  01/19/22 (!) 182/92    Wt Readings from Last 3 Encounters:  03/08/22 170 lb (77.1 kg)  01/19/22 170 lb 6.4 oz (77.3 kg)  01/19/22 170 lb (77.1 kg)    General appearance: alert, cooperative and appears stated age Ears: normal  TM's and external ear canals both ears Throat: lips, mucosa, and tongue normal; teeth and gums normal Neck: no adenopathy, no carotid bruit, supple, symmetrical, trachea midline and thyroid not enlarged, symmetric, no tenderness/mass/nodules Back: symmetric, no curvature. ROM normal. No CVA tenderness. Lungs: clear to auscultation bilaterally Heart: regular rate and rhythm, S1, S2 normal, no murmur, click, rub or gallop Abdomen: soft, non-tender; bowel sounds normal; no masses,  no organomegaly Pulses: 2+ and symmetric Skin: Skin color, texture, turgor normal. No rashes or lesions Lymph nodes: Cervical, supraclavicular, and axillary nodes normal. Neuro:  awake and interactive with normal mood and affect. Higher cortical functions are normal. Speech is clear without word-finding difficulty or dysarthria. Extraocular movements are intact. Visual fields of both eyes are grossly intact. Sensation to light touch is grossly intact bilaterally of upper and lower extremities. Motor examination shows 4+/5 symmetric hand grip and upper extremity and 5/5 lower extremity strength. There is no pronation or drift. Gait is non-ataxic   Lab Results  Component Value Date   HGBA1C 6.4 03/08/2022    Lab Results  Component Value Date   CREATININE 1.34 03/08/2022   CREATININE 1.80 (H) 01/12/2022   CREATININE 1.49 09/02/2021    Lab Results  Component Value Date   WBC 5.0 03/08/2022   HGB 12.6 (L) 03/08/2022   HCT 37.9 (L) 03/08/2022   PLT 141.0 (L) 03/08/2022   GLUCOSE 89 03/08/2022   CHOL 107 03/08/2022   TRIG 85.0 03/08/2022   HDL 53.30 03/08/2022   LDLDIRECT 32.0 03/08/2022   LDLCALC 36 03/08/2022   ALT 11 03/08/2022   AST 17 03/08/2022   NA 143 03/08/2022   K 4.1 03/08/2022   CL 105 03/08/2022   CREATININE 1.34 03/08/2022   BUN 27 (H) 03/08/2022   CO2 31 03/08/2022   TSH 2.68 03/08/2022   PSA 13.67 (H) 05/15/2014   HGBA1C 6.4 03/08/2022   MICROALBUR 3.8 (H) 03/08/2022    No results  found.  Assessment & Plan:   Problem List Items Addressed This Visit     Aortic arch atherosclerosis ([place])    Incidental finding on prior CT chest.  He is tolerating atorvastatin      CKD (chronic kidney disease), stage II    GFR has improved  Seeing [facility] nephrologist once annually.   Lab Results  Component Value Date   CREATININE 1.80 (H) 01/12/2022        Paroxysmal atrial fibrillation (HCC)    Not anticoagulated due to age and history of falls.  (Discussed  plan with paraschos).        Prostate cancer [facility])    Managed with hormone suppression.  Receiving Lupron.  He is followed by Urologist  houser who  changed his  therapy to  something given twice a year in the office,  the last (first dose was done in May and cost  him $750 out of pocket .  Sees him on FRiday       White coat syndrome with diagnosis of hypertension    Not at goal based on office readings  But  His Home readings have been reviewed and continue to remain at goal of 120/70  or lower.         Other Visit Diagnoses     Primary hypertension    -  Primary   Relevant Orders   Comp Met (CMET) (Completed)   Urine Microalbumin w/creat. ratio (Completed)   Hyperlipidemia, unspecified hyperlipidemia type       Relevant Orders   Lipid Profile (Completed)   Direct LDL (Completed)   Impaired fasting glucose       Relevant Orders   HgB A1c (Completed)   Long-term use of high-risk medication       Relevant Orders   TSH (Completed)   CBC with Differential/Platelet (Completed)       I spent a total of 40 minutes with this patient in a face to face visit on the date of this encounter reviewing the last office visit with me in       ,  most recent visit with cardiology  and oncology,  patient's diet and exercise habits, home blood pressure  readings,   and post visit ordering of testing and therapeutics.    Follow-up: Return in about 6 months (around 09/06/2022).   Crecencio Mc, MD

## 2022-03-08 NOTE — Assessment & Plan Note (Addendum)
Managed with hormone suppression.  Receiving Lupron.  He is followed by Urologist  houser who  changed his  therapy to something given twice a year in the office,  the last (first dose was done in May and cost  him $750 out of pocket .  Sees him on FRiday

## 2022-03-23 ENCOUNTER — Emergency Department: Payer: Medicare Other

## 2022-03-23 ENCOUNTER — Inpatient Hospital Stay
Admission: EM | Admit: 2022-03-23 | Discharge: 2022-03-30 | DRG: 521 | Disposition: A | Discharge status: Skilled Nursing Facility | Payer: Medicare Other | Attending: Internal Medicine | Admitting: Internal Medicine

## 2022-03-23 ENCOUNTER — Ambulatory Visit (INDEPENDENT_AMBULATORY_CARE_PROVIDER_SITE_OTHER): Payer: Medicare Other

## 2022-03-23 DIAGNOSIS — I10 Essential (primary) hypertension: Secondary | ICD-10-CM | POA: Diagnosis present

## 2022-03-23 DIAGNOSIS — N200 Calculus of kidney: Secondary | ICD-10-CM | POA: Diagnosis not present

## 2022-03-23 DIAGNOSIS — N1831 Chronic kidney disease, stage 3a: Secondary | ICD-10-CM | POA: Diagnosis not present

## 2022-03-23 DIAGNOSIS — M25551 Pain in right hip: Secondary | ICD-10-CM | POA: Diagnosis not present

## 2022-03-23 DIAGNOSIS — J449 Chronic obstructive pulmonary disease, unspecified: Secondary | ICD-10-CM | POA: Diagnosis not present

## 2022-03-23 DIAGNOSIS — M810 Age-related osteoporosis without current pathological fracture: Secondary | ICD-10-CM | POA: Diagnosis not present

## 2022-03-23 DIAGNOSIS — R0902 Hypoxemia: Secondary | ICD-10-CM | POA: Diagnosis not present

## 2022-03-23 DIAGNOSIS — J9601 Acute respiratory failure with hypoxia: Secondary | ICD-10-CM | POA: Diagnosis not present

## 2022-03-23 DIAGNOSIS — I129 Hypertensive chronic kidney disease with stage 1 through stage 4 chronic kidney disease, or unspecified chronic kidney disease: Secondary | ICD-10-CM | POA: Diagnosis not present

## 2022-03-23 DIAGNOSIS — N184 Chronic kidney disease, stage 4 (severe): Secondary | ICD-10-CM | POA: Diagnosis present

## 2022-03-23 DIAGNOSIS — N179 Acute kidney failure, unspecified: Secondary | ICD-10-CM | POA: Diagnosis not present

## 2022-03-23 DIAGNOSIS — S72091A Other fracture of head and neck of right femur, initial encounter for closed fracture: Secondary | ICD-10-CM | POA: Diagnosis not present

## 2022-03-23 DIAGNOSIS — Z86711 Personal history of pulmonary embolism: Secondary | ICD-10-CM

## 2022-03-23 DIAGNOSIS — I130 Hypertensive heart and chronic kidney disease with heart failure and stage 1 through stage 4 chronic kidney disease, or unspecified chronic kidney disease: Secondary | ICD-10-CM | POA: Diagnosis not present

## 2022-03-23 DIAGNOSIS — W101XXA Fall (on)(from) sidewalk curb, initial encounter: Secondary | ICD-10-CM | POA: Diagnosis present

## 2022-03-23 DIAGNOSIS — R918 Other nonspecific abnormal finding of lung field: Secondary | ICD-10-CM | POA: Diagnosis not present

## 2022-03-23 DIAGNOSIS — J9600 Acute respiratory failure, unspecified whether with hypoxia or hypercapnia: Secondary | ICD-10-CM | POA: Diagnosis not present

## 2022-03-23 DIAGNOSIS — Z833 Family history of diabetes mellitus: Secondary | ICD-10-CM

## 2022-03-23 DIAGNOSIS — I2489 Other forms of acute ischemic heart disease: Secondary | ICD-10-CM | POA: Diagnosis present

## 2022-03-23 DIAGNOSIS — I420 Dilated cardiomyopathy: Secondary | ICD-10-CM | POA: Diagnosis not present

## 2022-03-23 DIAGNOSIS — Z471 Aftercare following joint replacement surgery: Secondary | ICD-10-CM | POA: Diagnosis not present

## 2022-03-23 DIAGNOSIS — N400 Enlarged prostate without lower urinary tract symptoms: Secondary | ICD-10-CM | POA: Diagnosis present

## 2022-03-23 DIAGNOSIS — Z23 Encounter for immunization: Secondary | ICD-10-CM

## 2022-03-23 DIAGNOSIS — I509 Heart failure, unspecified: Secondary | ICD-10-CM | POA: Diagnosis not present

## 2022-03-23 DIAGNOSIS — Z96641 Presence of right artificial hip joint: Secondary | ICD-10-CM | POA: Diagnosis not present

## 2022-03-23 DIAGNOSIS — R0603 Acute respiratory distress: Secondary | ICD-10-CM | POA: Diagnosis not present

## 2022-03-23 DIAGNOSIS — Z515 Encounter for palliative care: Secondary | ICD-10-CM | POA: Diagnosis not present

## 2022-03-23 DIAGNOSIS — I5023 Acute on chronic systolic (congestive) heart failure: Secondary | ICD-10-CM | POA: Diagnosis not present

## 2022-03-23 DIAGNOSIS — Z743 Need for continuous supervision: Secondary | ICD-10-CM | POA: Diagnosis not present

## 2022-03-23 DIAGNOSIS — I480 Paroxysmal atrial fibrillation: Secondary | ICD-10-CM | POA: Diagnosis present

## 2022-03-23 DIAGNOSIS — S51011A Laceration without foreign body of right elbow, initial encounter: Secondary | ICD-10-CM | POA: Diagnosis not present

## 2022-03-23 DIAGNOSIS — R627 Adult failure to thrive: Secondary | ICD-10-CM | POA: Diagnosis present

## 2022-03-23 DIAGNOSIS — S199XXA Unspecified injury of neck, initial encounter: Secondary | ICD-10-CM | POA: Diagnosis not present

## 2022-03-23 DIAGNOSIS — N2581 Secondary hyperparathyroidism of renal origin: Secondary | ICD-10-CM | POA: Diagnosis present

## 2022-03-23 DIAGNOSIS — D72828 Other elevated white blood cell count: Secondary | ICD-10-CM | POA: Diagnosis present

## 2022-03-23 DIAGNOSIS — Z66 Do not resuscitate: Secondary | ICD-10-CM | POA: Diagnosis not present

## 2022-03-23 DIAGNOSIS — Z79899 Other long term (current) drug therapy: Secondary | ICD-10-CM | POA: Diagnosis not present

## 2022-03-23 DIAGNOSIS — I2699 Other pulmonary embolism without acute cor pulmonale: Secondary | ICD-10-CM | POA: Diagnosis not present

## 2022-03-23 DIAGNOSIS — I517 Cardiomegaly: Secondary | ICD-10-CM | POA: Diagnosis not present

## 2022-03-23 DIAGNOSIS — S72001D Fracture of unspecified part of neck of right femur, subsequent encounter for closed fracture with routine healing: Secondary | ICD-10-CM | POA: Diagnosis not present

## 2022-03-23 DIAGNOSIS — M47812 Spondylosis without myelopathy or radiculopathy, cervical region: Secondary | ICD-10-CM | POA: Diagnosis present

## 2022-03-23 DIAGNOSIS — R9431 Abnormal electrocardiogram [ECG] [EKG]: Secondary | ICD-10-CM | POA: Diagnosis not present

## 2022-03-23 DIAGNOSIS — R531 Weakness: Secondary | ICD-10-CM | POA: Diagnosis not present

## 2022-03-23 DIAGNOSIS — E785 Hyperlipidemia, unspecified: Secondary | ICD-10-CM | POA: Diagnosis present

## 2022-03-23 DIAGNOSIS — S72001A Fracture of unspecified part of neck of right femur, initial encounter for closed fracture: Secondary | ICD-10-CM | POA: Diagnosis present

## 2022-03-23 DIAGNOSIS — D631 Anemia in chronic kidney disease: Secondary | ICD-10-CM | POA: Diagnosis not present

## 2022-03-23 DIAGNOSIS — N183 Chronic kidney disease, stage 3 unspecified: Secondary | ICD-10-CM | POA: Diagnosis not present

## 2022-03-23 DIAGNOSIS — R0600 Dyspnea, unspecified: Secondary | ICD-10-CM | POA: Diagnosis not present

## 2022-03-23 DIAGNOSIS — E538 Deficiency of other specified B group vitamins: Secondary | ICD-10-CM

## 2022-03-23 DIAGNOSIS — I5033 Acute on chronic diastolic (congestive) heart failure: Secondary | ICD-10-CM | POA: Diagnosis not present

## 2022-03-23 DIAGNOSIS — Z043 Encounter for examination and observation following other accident: Secondary | ICD-10-CM | POA: Diagnosis not present

## 2022-03-23 DIAGNOSIS — Z8249 Family history of ischemic heart disease and other diseases of the circulatory system: Secondary | ICD-10-CM | POA: Diagnosis not present

## 2022-03-23 DIAGNOSIS — G9340 Encephalopathy, unspecified: Secondary | ICD-10-CM | POA: Diagnosis not present

## 2022-03-23 DIAGNOSIS — S0990XA Unspecified injury of head, initial encounter: Secondary | ICD-10-CM | POA: Diagnosis not present

## 2022-03-23 DIAGNOSIS — R609 Edema, unspecified: Secondary | ICD-10-CM | POA: Diagnosis not present

## 2022-03-23 DIAGNOSIS — Z7951 Long term (current) use of inhaled steroids: Secondary | ICD-10-CM

## 2022-03-23 DIAGNOSIS — S72001S Fracture of unspecified part of neck of right femur, sequela: Secondary | ICD-10-CM | POA: Diagnosis not present

## 2022-03-23 DIAGNOSIS — Z807 Family history of other malignant neoplasms of lymphoid, hematopoietic and related tissues: Secondary | ICD-10-CM

## 2022-03-23 DIAGNOSIS — Z9981 Dependence on supplemental oxygen: Secondary | ICD-10-CM | POA: Diagnosis not present

## 2022-03-23 DIAGNOSIS — M4802 Spinal stenosis, cervical region: Secondary | ICD-10-CM | POA: Diagnosis not present

## 2022-03-23 DIAGNOSIS — I672 Cerebral atherosclerosis: Secondary | ICD-10-CM | POA: Diagnosis not present

## 2022-03-23 DIAGNOSIS — Y92480 Sidewalk as the place of occurrence of the external cause: Secondary | ICD-10-CM | POA: Diagnosis not present

## 2022-03-23 DIAGNOSIS — Z87891 Personal history of nicotine dependence: Secondary | ICD-10-CM

## 2022-03-23 DIAGNOSIS — Z7901 Long term (current) use of anticoagulants: Secondary | ICD-10-CM | POA: Diagnosis not present

## 2022-03-23 DIAGNOSIS — Z801 Family history of malignant neoplasm of trachea, bronchus and lung: Secondary | ICD-10-CM

## 2022-03-23 DIAGNOSIS — Z7401 Bed confinement status: Secondary | ICD-10-CM | POA: Diagnosis not present

## 2022-03-23 DIAGNOSIS — R1312 Dysphagia, oropharyngeal phase: Secondary | ICD-10-CM | POA: Diagnosis not present

## 2022-03-23 DIAGNOSIS — W19XXXD Unspecified fall, subsequent encounter: Secondary | ICD-10-CM | POA: Diagnosis not present

## 2022-03-23 DIAGNOSIS — R1310 Dysphagia, unspecified: Secondary | ICD-10-CM | POA: Diagnosis not present

## 2022-03-23 LAB — COMPREHENSIVE METABOLIC PANEL
ALT: 16 U/L (ref 0–44)
AST: 25 U/L (ref 15–41)
Albumin: 3.4 g/dL — ABNORMAL LOW (ref 3.5–5.0)
Alkaline Phosphatase: 68 U/L (ref 38–126)
Anion gap: 5 (ref 5–15)
BUN: 28 mg/dL — ABNORMAL HIGH (ref 8–23)
CO2: 26 mmol/L (ref 22–32)
Calcium: 9.1 mg/dL (ref 8.9–10.3)
Chloride: 111 mmol/L (ref 98–111)
Creatinine, Ser: 1.31 mg/dL — ABNORMAL HIGH (ref 0.61–1.24)
GFR, Estimated: 51 mL/min — ABNORMAL LOW (ref >60)
Glucose, Bld: 122 mg/dL — ABNORMAL HIGH (ref 70–99)
Potassium: 4.4 mmol/L (ref 3.5–5.1)
Sodium: 142 mmol/L (ref 135–145)
Total Bilirubin: 1 mg/dL (ref 0.3–1.2)
Total Protein: 6.1 g/dL — ABNORMAL LOW (ref 6.5–8.1)

## 2022-03-23 LAB — CBC WITH DIFFERENTIAL/PLATELET
Abs Immature Granulocytes: 0.09 10*3/uL — ABNORMAL HIGH (ref 0.00–0.07)
Basophils Absolute: 0.1 10*3/uL (ref 0.0–0.1)
Basophils Relative: 1 %
Eosinophils Absolute: 0.2 10*3/uL (ref 0.0–0.5)
Eosinophils Relative: 2 %
HCT: 39.5 % (ref 39.0–52.0)
Hemoglobin: 13.1 g/dL (ref 13.0–17.0)
Immature Granulocytes: 1 %
Lymphocytes Relative: 12 %
Lymphs Abs: 0.8 10*3/uL (ref 0.7–4.0)
MCH: 33 pg (ref 26.0–34.0)
MCHC: 33.2 g/dL (ref 30.0–36.0)
MCV: 99.5 fL (ref 80.0–100.0)
Monocytes Absolute: 0.4 10*3/uL (ref 0.1–1.0)
Monocytes Relative: 5 %
Neutro Abs: 5.2 10*3/uL (ref 1.7–7.7)
Neutrophils Relative %: 79 %
Platelets: 144 10*3/uL — ABNORMAL LOW (ref 150–400)
RBC: 3.97 MIL/uL — ABNORMAL LOW (ref 4.22–5.81)
RDW: 14.1 % (ref 11.5–15.5)
WBC: 6.6 10*3/uL (ref 4.0–10.5)
nRBC: 0 % (ref 0.0–0.2)

## 2022-03-23 MED ORDER — CARVEDILOL 6.25 MG PO TABS
6.2500 mg | ORAL_TABLET | Freq: Two times a day (BID) | ORAL | Status: DC
  Administered 2022-03-24 – 2022-03-30 (×14): 6.25 mg via ORAL
  Filled 2022-03-23: qty 2
  Filled 2022-03-23: qty 1
  Filled 2022-03-23: qty 2
  Filled 2022-03-23 (×3): qty 1
  Filled 2022-03-23 (×2): qty 2
  Filled 2022-03-23 (×4): qty 1
  Filled 2022-03-23: qty 2
  Filled 2022-03-23: qty 1

## 2022-03-23 MED ORDER — CYANOCOBALAMIN 1000 MCG/ML IJ SOLN
1000.0000 ug | Freq: Once | INTRAMUSCULAR | Status: AC
  Administered 2022-03-23: 1000 ug via INTRAMUSCULAR

## 2022-03-23 MED ORDER — ONDANSETRON HCL 4 MG/2ML IJ SOLN
4.0000 mg | Freq: Four times a day (QID) | INTRAMUSCULAR | Status: DC | PRN
  Administered 2022-03-23: 4 mg via INTRAVENOUS
  Filled 2022-03-23: qty 2

## 2022-03-23 MED ORDER — OXYCODONE HCL 5 MG PO TABS
5.0000 mg | ORAL_TABLET | Freq: Once | ORAL | Status: AC
  Administered 2022-03-23: 5 mg via ORAL
  Filled 2022-03-23 (×2): qty 1

## 2022-03-23 MED ORDER — CALCIUM CARBONATE 1250 (500 CA) MG PO TABS
1250.0000 mg | ORAL_TABLET | Freq: Every day | ORAL | Status: DC
  Administered 2022-03-25 – 2022-03-30 (×6): 1250 mg via ORAL
  Filled 2022-03-23 (×7): qty 1

## 2022-03-23 MED ORDER — TETANUS-DIPHTH-ACELL PERTUSSIS 5-2.5-18.5 LF-MCG/0.5 IM SUSY
0.5000 mL | PREFILLED_SYRINGE | Freq: Once | INTRAMUSCULAR | Status: AC
  Administered 2022-03-23: 0.5 mL via INTRAMUSCULAR
  Filled 2022-03-23: qty 0.5

## 2022-03-23 MED ORDER — SENNOSIDES-DOCUSATE SODIUM 8.6-50 MG PO TABS: 1.0000 | ORAL_TABLET | Freq: Every evening | ORAL | Status: DC | PRN

## 2022-03-23 MED ORDER — ATORVASTATIN CALCIUM 20 MG PO TABS
20.0000 mg | ORAL_TABLET | ORAL | Status: DC
  Administered 2022-03-26 – 2022-03-30 (×3): 20 mg via ORAL
  Filled 2022-03-23 (×4): qty 1

## 2022-03-23 MED ORDER — OXYCODONE HCL 5 MG PO TABS
5.0000 mg | ORAL_TABLET | Freq: Four times a day (QID) | ORAL | Status: DC | PRN
  Administered 2022-03-24 (×2): 5 mg via ORAL
  Filled 2022-03-23 (×2): qty 1

## 2022-03-23 MED ORDER — VITAMIN D3 25 MCG (1000 UNIT) PO TABS
1000.0000 [IU] | ORAL_TABLET | Freq: Every day | ORAL | Status: DC
  Administered 2022-03-25 – 2022-03-30 (×6): 1000 [IU] via ORAL
  Filled 2022-03-23 (×13): qty 1

## 2022-03-23 MED ORDER — MORPHINE SULFATE (PF) 2 MG/ML IV SOLN
1.0000 mg | INTRAVENOUS | Status: DC | PRN
  Administered 2022-03-23 – 2022-03-24 (×2): 1 mg via INTRAVENOUS
  Filled 2022-03-23 (×2): qty 1

## 2022-03-23 MED ORDER — ACETAMINOPHEN 325 MG PO TABS: 650.0000 mg | ORAL_TABLET | Freq: Four times a day (QID) | ORAL | Status: DC | PRN

## 2022-03-23 MED ORDER — ONDANSETRON HCL 4 MG PO TABS: 4.0000 mg | ORAL_TABLET | Freq: Four times a day (QID) | ORAL | Status: DC | PRN

## 2022-03-23 MED ORDER — ACETAMINOPHEN 325 MG RE SUPP: 650.0000 mg | Freq: Four times a day (QID) | RECTAL | Status: DC | PRN

## 2022-03-23 NOTE — ED Provider Notes (Signed)
[facility] Provider Note    Event Date/Time   First MD Initiated Contact with Patient 03/23/22 2024     (approximate)   History   Fall (Mechanical fall after tripping on curb; Reports RIGHT hip pain 6/10; Denies loss of consciousness or head injury)   HPI  Eric Hicks is a 88 y.o. male   Past medical history of accessible atrial fibrillation not on anticoagulation, BPH, COPD, hypertension, osteoporosis who presents to the emergency department with an ambulatory mechanical slip and fall and fall onto his right side right hip injury and right elbow pain.  No head strike or loss of consciousness.  No anticoagulation.  He was unable to get up and walk.  No other injuries.  Syncope.  No preceding illnesses and no other medical complaints.    History was obtained via the patient. He has a friend at bedside who witnessed the event who is here as an independent historian who offers collateral information as above, she witnessed his tripped over a curb as he was stepping down and fell onto his right side.      Physical Exam   Triage Vital Signs: ED Triage Vitals  Enc Vitals Group     BP      Pulse      Resp      Temp      Temp src      SpO2      Weight      Height      Head Circumference      Peak Flow      Pain Score      Pain Loc      Pain Edu?      Excl. in [place]?     Most recent vital signs: Vitals:   03/23/22 2029  BP: (!) 158/106  Pulse: 78  Resp: 18  Temp: 97.6 F (36.4 C)  SpO2: 96%    General: Awake, no distress.  CV:  Good peripheral perfusion.  Resp:  Normal effort.  Abd:  No distention.  Other:  Neurovascular intact to the right lower extremity, tenderness to palpation of the right hip.  He has a skin tear to the right elbow but has no bony tenderness and has full active range of motion and is neurovascular intact to that extremity.  No signs of head trauma or neck tenderness step-off or deformity.  No other signs of injury to  the thorax abdomen or back.   ED Results / Procedures / Treatments   Labs (all labs ordered are listed, but only abnormal results are displayed) Labs Reviewed  BASIC METABOLIC PANEL  CBC WITH DIFFERENTIAL/PLATELET    I reviewed labs and they are notable for normal H&H   RADIOLOGY I independently reviewed and interpreted x-ray of the hip and see a right hip fracture fem neck.   PROCEDURES:  Critical Care performed: No  Procedures   MEDICATIONS ORDERED IN ED: Medications  Tdap (BOOSTRIX) injection 0.5 mL (has no administration in time range)    Consultants:  I spoke with Dr. Karel Jarvis of orthopedics regarding care plan for this patient.   IMPRESSION / MDM / ASSESSMENT AND PLAN / ED COURSE  I reviewed the triage vital signs and the nursing notes.                              Differential diagnosis includes, but is not limited to, hip fracture  or dislocation, elbow fracture or dislocation, intracranial bleeding, cervical spine injury.  Less likely syncopal episode given mechanical nature.   MDM: Patient with a slip and fall with right hip pain concerning for fracture or dislocation.  Indeed he has a femoral neck fracture on the right side but fortunately is neurovascular intact.  He does not take blood thinners did not hit his head, will take a CT of the head neck to check for bleeding cervical spine injury given distracting injury.  Check x-ray of the right elbow.  Tdap.  I spoke with Dr. Guss Bunde of orthopedic surgery who plans for OR repair tomorrow.  N.p.o. at midnight.  Hold all anticoagulation after midnight.  Patient admitted to medicine.  Patient's presentation is most consistent with acute presentation with potential threat to life or bodily function.       FINAL CLINICAL IMPRESSION(S) / ED DIAGNOSES   Final diagnoses:  Closed fracture of right hip, initial encounter [facility])     Rx / DC Orders   ED Discharge Orders     None        Note:  This  document was prepared using Dragon voice recognition software and may include unintentional dictation errors.    Lucillie Garfinkel, MD 03/23/22 2136

## 2022-03-23 NOTE — Assessment & Plan Note (Signed)
-   At baseline -Monitor renal function -Avoid nephrotoxins

## 2022-03-23 NOTE — Assessment & Plan Note (Signed)
-   Atorvastatin 20 mg daily resumed 

## 2022-03-23 NOTE — ED Triage Notes (Signed)
Mechanical fall after tripping on curb; Reports RIGHT hip pain 6/10; Denies loss of consciousness or head injury

## 2022-03-23 NOTE — H&P (Signed)
History and Physical   Eric Hicks RCV:[id number] DOB: 03-22-1927 DOA: 03/23/2022  PCP: Crecencio Mc, MD  Outpatient Specialists: Dr. Saralyn Pilar, [facility] cardiolgy Patient coming from: via EMS  I have personally briefly reviewed patient's old medical records in [place].  Chief Concern: Mechanical fall  HPI: Eric Hicks is a 88 year old male with history of hyperlipidemia, hypertension, impaired fasting glucose, history of whitecoat syndrome, who presents emergency department for chief concerns of mechanical fall and falling on his right hip and elbow.  Patient denies loss of consciousness and hitting his head.  Initial vitals in the emergency department showed temperature of 97.6, respiration rate 18, heart rate 78, blood pressure 158/106, SpO2 of 96% on room air.  Labs in the ED showed serum sodium 142, potassium 4.4, bicarb 26, BUN 28, serum creatinine 1.31, GFR 51, nonfasting glucose 122. WBC 6.6, hemoglobin 13.1, platelets 144.  ED treatment: Tdap booster has been ordered for injection.  Oxycodone 5 mg p.o. one-time dose. --------------------- At bedside patient was able to tell me his name, his age, the current calendar year, and he knows he is in the hospital.  He is a functioning 88 year-old and still able to drive performs his own ADLs.  He states that he was on his way walking outside of [place] after finishing a dinner date with his friend Pamala Hurry when he stepped down on the steps and lost his balance, and fell onto his right side.  He denies head trauma, loss of consciousness, chest pain, shortness of breath.  He denies dysuria, hematuria, nausea, vomiting, diarrhea.  He reports this is never happened before.  Social history: He lives on his own.  He is a former tobacco user for 8 years a very long time ago.  He endorses history of secondhand tobacco exposure via his son who is a former tobacco user and got sick and died from lung cancer.  He denies  EtOH and recreational drug use.  He is retired and formerly worked for [employer].  ROS: Constitutional: no weight change, no fever ENT/Mouth: no sore throat, no rhinorrhea Eyes: no eye pain, no vision changes Cardiovascular: no chest pain, no dyspnea,  no edema, no palpitations Respiratory: no cough, no sputum, no wheezing Gastrointestinal: no nausea, no vomiting, no diarrhea, no constipation Genitourinary: no urinary incontinence, no dysuria, no hematuria Musculoskeletal: + arthralgias, no myalgias Skin: no skin lesions, no pruritus, Neuro: + weakness, no loss of consciousness, no syncope Psych: no anxiety, no depression, no decrease appetite Heme/Lymph: no bruising, no bleeding  ED Course: Discussed with emergency medicine provider, patient requiring hospitalization for chief concerns of mildly impacted right femoral neck fracture.  Assessment/Plan  Principal Problem:   Closed right hip fracture (HCC) Active Problems:   BPH (benign prostatic hyperplasia)   Osteoporosis   White coat syndrome with diagnosis of hypertension   OA (osteoarthritis) of neck   Nonischemic dilated cardiomyopathy (HCC)   Paroxysmal atrial fibrillation (HCC)   Stage 3a chronic kidney disease (CKD) (HCC)   Hyperlipidemia   Assessment and Plan:  * Closed right hip fracture ([place]) - Fall precaution - Symptomatic support: Oxycodone 5 mg every 6 hours as needed for moderate pain, 3 doses ordered; morphine 1 mg IV every 4 hours as needed for severe pain, 4 doses ordered - N.p.o. after midnight  Hyperlipidemia - Atorvastatin 20 mg daily resumed  Stage 3a chronic kidney disease (CKD) (HCC) - At baseline  Paroxysmal atrial fibrillation (HCC) Chronic atrial fibrillation and  sees [facility] clinic cardiology - Per note on 02/11/2022, patient has declined anticoagulation therefore he is not on anticoagulation - Patient is on carvedilol 6.25 mg p.o. twice daily, this will be resumed  Chart reviewed.    DVT prophylaxis: None, orthopedic surgeon requesting no anticoagulation as patient is anticipating OR afternoon of 03/24/2022 Code Status: Full code Diet: Heart healthy; n.p.o. after midnight Family Communication: Updated son at bedside Disposition Plan: Pending clinical course Consults called: Orthopedic provider Admission status: MedSurg, inpatient  Past Medical History:  Diagnosis Date   BPH (benign prostatic hyperplasia)    managed by Olena Heckle   COPD (chronic obstructive pulmonary disease) ([place])    Elevated PSA, between 10 and less than 20 ng/ml    Olena Heckle, watchful waiting   Hypertension    Osteoporosis    by DEXA   Past Surgical History:  Procedure Laterality Date   TYMPANOSTOMY TUBE PLACEMENT  2017   Social History:  reports that he has quit smoking. He has never used smokeless tobacco. He reports that he does not drink alcohol. No history on file for drug use.  Allergies  Allergen Reactions   Tylenol [Acetaminophen] Rash   Family History  Problem Relation Age of Onset   Diabetes Mother    Hypertension Father    Cancer Brother 63       multiple myeloma   Family history: Family history reviewed and not pertinent  Prior to Admission medications   Medication Sig Start Date End Date Taking? Authorizing Provider  ADVAIR DISKUS 100-50 MCG/ACT AEPB INHALE 1 PUFF INTO THE LUNGS TWICE A DAY 02/01/22   Crecencio Mc, MD  Aflibercept (EYLEA IO) Inject into the eye.    [provider]  AMBULATORY NON FORMULARY MEDICATION Joint Advantage Gold 2 tablets daily    [provider]  atorvastatin (LIPITOR) 20 MG tablet TAKE 1 TABLET BY MOUTH EVERY OTHER DAY 08/28/21   Crecencio Mc, MD  calcium carbonate (OS-CAL) 600 MG TABS Take 600 mg by mouth daily with breakfast.     [provider]  carvedilol (COREG) 6.25 MG tablet Take 6.25 mg by mouth 2 (two) times daily. 05/19/19   [provider]  dorzolamide-timolol (COSOPT) 22.3-6.8 MG/ML  ophthalmic solution TAKE 1 DROP(S) IN RIGHT EYE 2 TIMES A DAY 07/29/17   [provider]  doxazosin (CARDURA) 1 MG tablet TAKE 1 TABLET BY MOUTH EVERY DAY 09/28/21   Crecencio Mc, MD  furosemide (LASIX) 20 MG tablet TAKE 2 TABLETS BY MOUTH DAILY AS NEEDED 12/20/21   Dutch Quint B, FNP  latanoprost (XALATAN) 0.005 % ophthalmic solution Place 1 drop into both eyes at bedtime. 03/21/18   [provider]  leuprolide, 6 Month, (ELIGARD) 45 MG injection Inject 45 mg into the skin every 6 (six) months.    [provider]  Vitamin D, Cholecalciferol, 25 MCG (1000 UT) CAPS Take 1 capsule by mouth daily.    [provider]   Physical Exam: Vitals:   03/23/22 2029 03/23/22 2233  BP: (!) 158/106 (!) 157/96  Pulse: 78 89  Resp: 18 19  Temp: 97.6 F (36.4 C)   TempSrc: Oral   SpO2: 96% 90%  Weight: 77.1 kg   Height: _0  (1.778 m)    Constitutional: appears age-appropriate, NAD, calm, comfortable Eyes: PERRL, lids and conjunctivae normal ENMT: Mucous membranes are moist. Posterior pharynx clear of any exudate or lesions. Age-appropriate dentition. Hearing appropriate Neck: normal, supple, no masses, no thyromegaly Respiratory: clear to auscultation  bilaterally, no wheezing, no crackles. Normal respiratory effort. No accessory muscle use.  Cardiovascular: Regular rate and rhythm, no murmurs / rubs / gallops. No extremity edema. 2+ pedal pulses. No carotid bruits.  Abdomen: no tenderness, no masses palpated, no hepatosplenomegaly. Bowel sounds positive.  Musculoskeletal: no clubbing / cyanosis. No joint deformity upper and lower extremities. Good ROM, no contractures, no atrophy. Normal muscle tone.  Skin: no rashes, lesions, ulcers. No induration Neurologic: Sensation intact. Strength 5/5 in all 4.  Psychiatric: Normal judgment and insight. Alert and oriented x 3. Normal mood.   EKG: Not indicated at this time  Chest x-ray on Admission: I personally reviewed  and I agree with radiologist reading as below.  CT Head Wo Contrast  Result Date: 03/23/2022 CLINICAL DATA:  Head trauma, minor (Age >= 65y); Neck trauma (Age >= 65y) EXAM: CT HEAD WITHOUT CONTRAST CT CERVICAL SPINE WITHOUT CONTRAST TECHNIQUE: Multidetector CT imaging of the head and cervical spine was performed following the standard protocol without intravenous contrast. Multiplanar CT image reconstructions of the cervical spine were also generated. RADIATION DOSE REDUCTION: This exam was performed according to the departmental dose-optimization program which includes automated exposure control, adjustment of the mA and/or kV according to patient size and/or use of iterative reconstruction technique. COMPARISON:  CT chest 01/12/2022, CT chest 01/09/2021 FINDINGS: CT HEAD FINDINGS Brain: Cerebral ventricle sizes are concordant with the degree of cerebral volume loss. Patchy and confluent areas of decreased attenuation are noted throughout the deep and periventricular white matter of the cerebral hemispheres bilaterally, compatible with chronic microvascular ischemic disease. No evidence of large-territorial acute infarction. No parenchymal hemorrhage. No mass lesion. No extra-axial collection. No mass effect or midline shift. No hydrocephalus. Basilar cisterns are patent. Vascular: No hyperdense vessel. Atherosclerotic calcifications are present within the cavernous internal carotid and vertebral arteries. Skull: No acute fracture or focal lesion. Sinuses/Orbits: Paranasal sinuses and mastoid air cells are clear. Bilateral lens replacement. Otherwise the orbits are unremarkable. Other: None. CT CERVICAL SPINE FINDINGS Alignment: Normal. Skull base and vertebrae: Multilevel severe degenerative changes spine most prominent at the C1-C2 and C5-C6 levels. Associated moderate to severe osseous neural foraminal stenosis at the left C3-C4 level. No acute fracture. No aggressive appearing focal osseous lesion or focal  pathologic process. Soft tissues and spinal canal: No prevertebral fluid or swelling. No visible canal hematoma. Upper chest: Partially visualized right apical masslike airspace opacity measuring up to at least 4.2 cm. Other: None. IMPRESSION: 1. No acute intracranial abnormality. 2. No acute displaced fracture or traumatic listhesis of the cervical spine. 3. Partially visualized right apical masslike airspace opacity measuring up to at least 4.2 cm. 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 Electronically Signed   By: Iven Finn M.D.   On: 03/23/2022 20:56   CT Cervical Spine Wo Contrast  Result Date: 03/23/2022 CLINICAL DATA:  Head trauma, minor (Age >= 65y); Neck trauma (Age >= 65y) EXAM: CT HEAD WITHOUT CONTRAST CT CERVICAL SPINE WITHOUT CONTRAST TECHNIQUE: Multidetector CT imaging of the head and cervical spine was performed following the standard protocol without intravenous contrast. Multiplanar CT image reconstructions of the cervical spine were also generated. RADIATION DOSE REDUCTION: This exam was performed according to the departmental dose-optimization program which includes automated exposure control, adjustment of the mA and/or kV according to patient size and/or use of iterative reconstruction technique. COMPARISON:  CT chest 01/12/2022, CT chest 01/09/2021 FINDINGS: CT HEAD FINDINGS Brain: Cerebral ventricle sizes are concordant  with the degree of cerebral volume loss. Patchy and confluent areas of decreased attenuation are noted throughout the deep and periventricular white matter of the cerebral hemispheres bilaterally, compatible with chronic microvascular ischemic disease. No evidence of large-territorial acute infarction. No parenchymal hemorrhage. No mass lesion. No extra-axial collection. No mass effect or midline shift. No hydrocephalus. Basilar cisterns are patent. Vascular: No hyperdense vessel.  Atherosclerotic calcifications are present within the cavernous internal carotid and vertebral arteries. Skull: No acute fracture or focal lesion. Sinuses/Orbits: Paranasal sinuses and mastoid air cells are clear. Bilateral lens replacement. Otherwise the orbits are unremarkable. Other: None. CT CERVICAL SPINE FINDINGS Alignment: Normal. Skull base and vertebrae: Multilevel severe degenerative changes spine most prominent at the C1-C2 and C5-C6 levels. Associated moderate to severe osseous neural foraminal stenosis at the left C3-C4 level. No acute fracture. No aggressive appearing focal osseous lesion or focal pathologic process. Soft tissues and spinal canal: No prevertebral fluid or swelling. No visible canal hematoma. Upper chest: Partially visualized right apical masslike airspace opacity measuring up to at least 4.2 cm. Other: None. IMPRESSION: 1. No acute intracranial abnormality. 2. No acute displaced fracture or traumatic listhesis of the cervical spine. 3. Partially visualized right apical masslike airspace opacity measuring up to at least 4.2 cm. 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 Electronically Signed   By: Iven Finn M.D.   On: 03/23/2022 20:56   DG Elbow Complete Right  Result Date: 03/23/2022 CLINICAL DATA:  Fall EXAM: RIGHT ELBOW - COMPLETE 4 VIEW COMPARISON:  None Available. FINDINGS: There is no evidence of fracture, dislocation, or joint effusion. There is no evidence of arthropathy or other focal bone abnormality. Soft tissues are unremarkable. IMPRESSION: Negative. Electronically Signed   By: Sammie Bench M.D.   On: 03/23/2022 20:55   DG Chest 1 View  Result Date: 03/23/2022 CLINICAL DATA:  Fall. EXAM: CHEST  1 VIEW COMPARISON:  January 16, 2019. FINDINGS: Stable cardiomegaly. Both lungs are clear. The visualized skeletal structures are unremarkable. IMPRESSION: No active disease.  Electronically Signed   By: Marijo Conception M.D.   On: 03/23/2022 20:23   DG Hip Unilat W or Wo Pelvis 2-3 Views Right  Result Date: 03/23/2022 CLINICAL DATA:  Right hip pain after fall. EXAM: DG HIP (WITH OR WITHOUT PELVIS) 2-3V RIGHT COMPARISON:  April 11, 2014. FINDINGS: Mildly impacted right femoral neck fracture is noted. Left hip is unremarkable. IMPRESSION: Mildly impacted right femoral neck fracture Electronically Signed   By: Marijo Conception M.D.   On: 03/23/2022 20:22    Labs on Admission: I have personally reviewed following labs  CBC: Recent Labs  Lab 03/23/22 2058  WBC 6.6  NEUTROABS 5.2  HGB 13.1  HCT 39.5  MCV 99.5  PLT 291*   Basic Metabolic Panel: Recent Labs  Lab 03/23/22 2058  NA 142  K 4.4  CL 111  CO2 26  GLUCOSE 122*  BUN 28*  CREATININE 1.31*  CALCIUM 9.1   GFR: Estimated Creatinine Clearance: 36.4 mL/min (A) (by C-G formula based on SCr of 1.31 mg/dL (H)).  Liver Function Tests: Recent Labs  Lab 03/23/22 2058  AST 25  ALT 16  ALKPHOS 68  BILITOT 1.0  PROT 6.1*  ALBUMIN 3.4*   Dr. Tobie Poet [facility]  If 7PM-7AM, please contact overnight-coverage provider If 7AM-7PM, please contact day coverage provider [email or web address]  03/23/2022, 11:31 PM

## 2022-03-23 NOTE — Hospital Course (Addendum)
[name] is a 88 year old male with history of hyperlipidemia, hypertension, impaired fasting glucose, history of whitecoat syndrome, who presents emergency department for chief concerns of mechanical fall and falling on his right hip and elbow, resulted and mildly impacted right femoral neck fracture  Patient denies loss of consciousness and hitting his head.  Initial vitals in the emergency department showed temperature of 97.6, respiration rate 18, heart rate 78, blood pressure 158/106, SpO2 of 96% on room air.  Labs in the ED showed serum sodium 142, potassium 4.4, bicarb 26, BUN 28, serum creatinine 1.31, GFR 51, nonfasting glucose 122. WBC 6.6, hemoglobin 13.1, platelets 144. CT head, CT cervical spine, CT chest and EKG elbow was without any acute abnormality. DG hip with mildly impacted right femoral neck fracture  ED treatment: Tdap booster has been ordered for injection.  Oxycodone 5 mg p.o. one-time dose.  11/29: Hemodynamically stable on 3 L oxygen, labs pertinent for leukocytosis, most likely reactive, hemoglobin decreased to 12.5 from 13.1, BMP with slight worsening of BUN 31 and creatinine 1.40 which remained around his baseline of 1.3-1.4. Going to the OR with orthopedic surgery today.  11/30: Patient underwent hemiarthroplasty with orthopedic surgery on 11/29.  Required transfer Amick acid twice for bleeding.  Also received 1 dose of IV Lasix in PACU. Blood pressure mildly elevated at 142/82 this morning.  Renal function with slight deterioration to 41/1.58, GFR decreased to 41 from 47. Anemia panel with normal iron and low normal folate, B12 pending. Patient also with new oxygen requirement-history of COPD-no wheezing. Encouraging incentive spirometry and flutter valve. Chest x-ray with stable central vascular congestion, bilateral perihilar airspace disease left pleural effusion and BNP markedly elevated at 1960.  Patient was given IV Lasix and started on regular diuresis.   Also found to have elevated troponin, patient was started on heparin infusion and cardiology was consulted.  12/1: Patient overnight with worsening respiratory status, started on heated high flow with FiO2 of 80%, which is now weaned to 55% and 45L.  D-dimer was also found to be elevated at 2.51 which also makes PE possible.  VQ scan ordered for concern of PE, trying to avoid contrast with CTA due to AKI.  VQ scan with acute PE.   Worsening renal function today with BUN 50, creatinine 1.90.  Renal ultrasound with no hydronephrosis , some cortical thinning and no other significant abnormality. nephrology was also consulted. Holding a.m. dose of Lasix. Echocardiogram with improvement of EF to 50 to 35%, grade 1 diastolic dysfunction and no regional wall motion abnormalities.  No right heart strain mentioned. I will let cardiology decide about further diuresis.  Patient might get benefit from Lasix infusion for gentle diuresis. Also sending message to vascular surgery due to worsening respiratory status.  Patient with multiorgan failure, high risk for deterioration and mortality.  Discussed with his 2 sons and daughter at bedside.  They do have some legal papers which tells that he does not want extraordinary measures like feeding tube.  Not sure about DNR, he is currently full code.  Discussed with family to decide.  Also consulting palliative care.  12/2: Patient currently hemodynamically stable with improved oxygen requirement, able to wean to 6 L.  Improving renal function with GFR of 41 and creatinine of 1.58 today. He was not a candidate for any surgical or vascular intervention as being evaluated by vascular surgery also.  Leukocytosis resolved.  12/3: Vital seems stable, saturating in mid 90s on 4 L of oxygen.  Creatinine improved to baseline, at 1.33.  CBC seems stable.  12/4: Patient had 1 episode of choking this morning but remained stable on 4 L of oxygen. Swallow team placed him on  dysphagia diet.  Dozing off while talking and eating. Palliative care met with family and CODE STATUS changed to DNR.  12/5: Patient remained medically stable.  He was saturating 100% on 4 L of oxygen and should be able to wean down.  He is being discharged on 2 to 4 L of oxygen and facility can try weaning him appropriately.  Patient is being discharged to rehab for further management.  He will use Eliquis 5 mg twice daily for PE, will require anticoagulation for at least 3 to 19-month his PCP can decide the duration. He is also being given tramadol to be used as needed for pain.  He is allergic to acetaminophen and we will avoid NSAIDs due to CKD.  Patient will continue on current medications and need to have a close follow-up with his providers for further recommendations.  Patient will remain high risk for deterioration and mortality based on age, underlying comorbidities and recent hip fracture.

## 2022-03-23 NOTE — Assessment & Plan Note (Signed)
Chronic atrial fibrillation and sees [facility] clinic cardiology - Per note on 02/11/2022, patient has declined anticoagulation therefore he is not on anticoagulation - Patient is on carvedilol 6.25 mg p.o. twice daily, this will be resumed

## 2022-03-23 NOTE — Progress Notes (Signed)
Pt presented for their vitamin B12 injection. Pt was identified through two identifiers. Pt tolerated shot well in their left  deltoid.  

## 2022-03-23 NOTE — Assessment & Plan Note (Signed)
Secondary to mechanical fall. Most of 50+ consulted and he is going to hold off on hemiarthroplasty today - Fall precaution -Continue with pain management -PT/OT evaluation after the surgery

## 2022-03-24 ENCOUNTER — Inpatient Hospital Stay: Payer: Medicare Other

## 2022-03-24 ENCOUNTER — Other Ambulatory Visit: Payer: Self-pay

## 2022-03-24 ENCOUNTER — Encounter: Payer: Self-pay | Admitting: Internal Medicine

## 2022-03-24 ENCOUNTER — Inpatient Hospital Stay: Payer: Medicare Other | Admitting: Anesthesiology

## 2022-03-24 ENCOUNTER — Encounter
Admission: EM | Disposition: A | Discharge status: Skilled Nursing Facility | Payer: Self-pay | Source: Home / Self Care | Attending: Internal Medicine

## 2022-03-24 DIAGNOSIS — J9601 Acute respiratory failure with hypoxia: Secondary | ICD-10-CM

## 2022-03-24 HISTORY — PX: HIP ARTHROPLASTY: SHX981

## 2022-03-24 LAB — BASIC METABOLIC PANEL
Anion gap: 4 — ABNORMAL LOW (ref 5–15)
BUN: 31 mg/dL — ABNORMAL HIGH (ref 8–23)
CO2: 23 mmol/L (ref 22–32)
Calcium: 9 mg/dL (ref 8.9–10.3)
Chloride: 112 mmol/L — ABNORMAL HIGH (ref 98–111)
Creatinine, Ser: 1.4 mg/dL — ABNORMAL HIGH (ref 0.61–1.24)
GFR, Estimated: 47 mL/min — ABNORMAL LOW (ref >60)
Glucose, Bld: 141 mg/dL — ABNORMAL HIGH (ref 70–99)
Potassium: 4.3 mmol/L (ref 3.5–5.1)
Sodium: 139 mmol/L (ref 135–145)

## 2022-03-24 LAB — SURGICAL PCR SCREEN
MRSA, PCR: NEGATIVE
Staphylococcus aureus: NEGATIVE

## 2022-03-24 LAB — GLUCOSE, CAPILLARY: Glucose-Capillary: 146 mg/dL — ABNORMAL HIGH (ref 70–99)

## 2022-03-24 LAB — CBC
HCT: 38.2 % — ABNORMAL LOW (ref 39.0–52.0)
Hemoglobin: 12.5 g/dL — ABNORMAL LOW (ref 13.0–17.0)
MCH: 32.4 pg (ref 26.0–34.0)
MCHC: 32.7 g/dL (ref 30.0–36.0)
MCV: 99 fL (ref 80.0–100.0)
Platelets: 147 10*3/uL — ABNORMAL LOW (ref 150–400)
RBC: 3.86 MIL/uL — ABNORMAL LOW (ref 4.22–5.81)
RDW: 14.2 % (ref 11.5–15.5)
WBC: 14.9 10*3/uL — ABNORMAL HIGH (ref 4.0–10.5)
nRBC: 0 % (ref 0.0–0.2)

## 2022-03-24 SURGERY — HEMIARTHROPLASTY, HIP, DIRECT ANTERIOR APPROACH, FOR FRACTURE
Anesthesia: Spinal | Site: Hip | Laterality: Right

## 2022-03-24 MED ORDER — LACTATED RINGERS IV SOLN: INTRAVENOUS | Status: DC | PRN

## 2022-03-24 MED ORDER — CEFAZOLIN SODIUM-DEXTROSE 2-4 GM/100ML-% IV SOLN
INTRAVENOUS | Status: AC
  Filled 2022-03-24: qty 100

## 2022-03-24 MED ORDER — PROPOFOL 1000 MG/100ML IV EMUL
INTRAVENOUS | Status: AC
  Filled 2022-03-24: qty 100

## 2022-03-24 MED ORDER — BUPIVACAINE LIPOSOME 1.3 % IJ SUSP
INTRAMUSCULAR | Status: AC
  Filled 2022-03-24: qty 20

## 2022-03-24 MED ORDER — BUPIVACAINE-EPINEPHRINE (PF) 0.25% -1:200000 IJ SOLN
INTRAMUSCULAR | Status: AC
  Filled 2022-03-24: qty 30

## 2022-03-24 MED ORDER — IPRATROPIUM-ALBUTEROL 0.5-2.5 (3) MG/3ML IN SOLN
RESPIRATORY_TRACT | Status: AC
  Administered 2022-03-24: 3 mL via RESPIRATORY_TRACT
  Filled 2022-03-24: qty 3

## 2022-03-24 MED ORDER — TRANEXAMIC ACID-NACL 1000-0.7 MG/100ML-% IV SOLN
INTRAVENOUS | Status: AC
  Filled 2022-03-24: qty 100

## 2022-03-24 MED ORDER — TRANEXAMIC ACID-NACL 1000-0.7 MG/100ML-% IV SOLN
1000.0000 mg | INTRAVENOUS | Status: AC
  Administered 2022-03-24: 1000 mg via INTRAVENOUS

## 2022-03-24 MED ORDER — OXYCODONE HCL 5 MG PO TABS: 5.0000 mg | ORAL_TABLET | Freq: Once | ORAL | Status: DC | PRN

## 2022-03-24 MED ORDER — PHENOL 1.4 % MT LIQD: 1.0000 | OROMUCOSAL | Status: DC | PRN

## 2022-03-24 MED ORDER — ONDANSETRON HCL 4 MG PO TABS: 4.0000 mg | ORAL_TABLET | Freq: Four times a day (QID) | ORAL | Status: DC | PRN

## 2022-03-24 MED ORDER — 0.9 % SODIUM CHLORIDE (POUR BTL) OPTIME
TOPICAL | Status: DC | PRN
  Administered 2022-03-24: 500 mL

## 2022-03-24 MED ORDER — FLUTICASONE FUROATE-VILANTEROL 100-25 MCG/ACT IN AEPB
1.0000 | INHALATION_SPRAY | Freq: Every day | RESPIRATORY_TRACT | Status: DC
  Administered 2022-03-25 – 2022-03-30 (×6): 1 via RESPIRATORY_TRACT
  Filled 2022-03-24 (×2): qty 28

## 2022-03-24 MED ORDER — TRANEXAMIC ACID-NACL 1000-0.7 MG/100ML-% IV SOLN
1000.0000 mg | Freq: Once | INTRAVENOUS | Status: AC
  Administered 2022-03-24: 1000 mg via INTRAVENOUS

## 2022-03-24 MED ORDER — CEFAZOLIN SODIUM-DEXTROSE 2-4 GM/100ML-% IV SOLN
2.0000 g | Freq: Four times a day (QID) | INTRAVENOUS | Status: AC
  Administered 2022-03-24 – 2022-03-25 (×2): 2 g via INTRAVENOUS
  Filled 2022-03-24 (×2): qty 100

## 2022-03-24 MED ORDER — FUROSEMIDE 10 MG/ML IJ SOLN: 20.0000 mg | Freq: Once | INTRAMUSCULAR | Status: AC

## 2022-03-24 MED ORDER — VASOPRESSIN 20 UNIT/ML IV SOLN
INTRAVENOUS | Status: DC | PRN
  Administered 2022-03-24 (×7): 2 [IU] via INTRAVENOUS

## 2022-03-24 MED ORDER — MENTHOL 3 MG MT LOZG: 1.0000 | LOZENGE | OROMUCOSAL | Status: DC | PRN

## 2022-03-24 MED ORDER — FUROSEMIDE 10 MG/ML IJ SOLN
INTRAMUSCULAR | Status: AC
  Administered 2022-03-24: 20 mg via INTRAVENOUS
  Filled 2022-03-24: qty 2

## 2022-03-24 MED ORDER — ENOXAPARIN SODIUM 30 MG/0.3ML IJ SOSY
30.0000 mg | PREFILLED_SYRINGE | INTRAMUSCULAR | Status: DC
  Administered 2022-03-25: 30 mg via SUBCUTANEOUS
  Filled 2022-03-24: qty 0.3

## 2022-03-24 MED ORDER — CEFAZOLIN SODIUM-DEXTROSE 2-4 GM/100ML-% IV SOLN
2.0000 g | Freq: Once | INTRAVENOUS | Status: AC
  Administered 2022-03-24: 2 g via INTRAVENOUS

## 2022-03-24 MED ORDER — MORPHINE SULFATE (PF) 2 MG/ML IV SOLN
0.5000 mg | INTRAVENOUS | Status: DC | PRN
  Administered 2022-03-30: 0.5 mg via INTRAVENOUS
  Filled 2022-03-24: qty 1

## 2022-03-24 MED ORDER — IPRATROPIUM-ALBUTEROL 0.5-2.5 (3) MG/3ML IN SOLN
RESPIRATORY_TRACT | Status: AC
  Filled 2022-03-24: qty 3

## 2022-03-24 MED ORDER — ONDANSETRON HCL 4 MG/2ML IJ SOLN: 4.0000 mg | Freq: Four times a day (QID) | INTRAMUSCULAR | Status: DC | PRN

## 2022-03-24 MED ORDER — FENTANYL CITRATE (PF) 100 MCG/2ML IJ SOLN: 25.0000 ug | INTRAMUSCULAR | Status: DC | PRN

## 2022-03-24 MED ORDER — METOCLOPRAMIDE HCL 5 MG/ML IJ SOLN
5.0000 mg | Freq: Three times a day (TID) | INTRAMUSCULAR | Status: DC | PRN
  Administered 2022-03-25: 10 mg via INTRAVENOUS
  Filled 2022-03-24: qty 2

## 2022-03-24 MED ORDER — DOCUSATE SODIUM 100 MG PO CAPS
100.0000 mg | ORAL_CAPSULE | Freq: Two times a day (BID) | ORAL | Status: DC
  Administered 2022-03-24 – 2022-03-30 (×8): 100 mg via ORAL
  Filled 2022-03-24 (×9): qty 1

## 2022-03-24 MED ORDER — CHLORHEXIDINE GLUCONATE 0.12 % MT SOLN
OROMUCOSAL | Status: AC
  Administered 2022-03-24: 15 mL
  Filled 2022-03-24: qty 15

## 2022-03-24 MED ORDER — SODIUM CHLORIDE FLUSH 0.9 % IV SOLN
INTRAVENOUS | Status: AC
  Filled 2022-03-24: qty 10

## 2022-03-24 MED ORDER — PROPOFOL 500 MG/50ML IV EMUL
INTRAVENOUS | Status: DC | PRN
  Administered 2022-03-24: 20 ug/kg/min via INTRAVENOUS

## 2022-03-24 MED ORDER — DORZOLAMIDE HCL-TIMOLOL MAL 2-0.5 % OP SOLN
1.0000 [drp] | Freq: Two times a day (BID) | OPHTHALMIC | Status: DC
  Administered 2022-03-24 – 2022-03-30 (×12): 1 [drp] via OPHTHALMIC
  Filled 2022-03-24: qty 10

## 2022-03-24 MED ORDER — IRRISEPT - 450ML BOTTLE WITH 0.05% CHG IN STERILE WATER, USP 99.95% OPTIME
TOPICAL | Status: DC | PRN
  Administered 2022-03-24: 450 mL

## 2022-03-24 MED ORDER — MUPIROCIN 2 % EX OINT
1.0000 | TOPICAL_OINTMENT | Freq: Two times a day (BID) | CUTANEOUS | Status: AC
  Administered 2022-03-25 – 2022-03-28 (×6): 1 via NASAL
  Filled 2022-03-24 (×2): qty 22

## 2022-03-24 MED ORDER — IPRATROPIUM-ALBUTEROL 0.5-2.5 (3) MG/3ML IN SOLN: 3.0000 mL | Freq: Once | RESPIRATORY_TRACT | Status: AC

## 2022-03-24 MED ORDER — BUPIVACAINE IN DEXTROSE 0.75-8.25 % IT SOLN
INTRATHECAL | Status: DC | PRN
  Administered 2022-03-24: 2.4 mL via INTRATHECAL

## 2022-03-24 MED ORDER — DOXAZOSIN MESYLATE 1 MG PO TABS
1.0000 mg | ORAL_TABLET | Freq: Every day | ORAL | Status: DC
  Administered 2022-03-25 – 2022-03-30 (×6): 1 mg via ORAL
  Filled 2022-03-24 (×7): qty 1

## 2022-03-24 MED ORDER — PHENYLEPHRINE HCL-NACL 20-0.9 MG/250ML-% IV SOLN
INTRAVENOUS | Status: DC | PRN
  Administered 2022-03-24: 75 ug/min via INTRAVENOUS

## 2022-03-24 MED ORDER — SODIUM CHLORIDE 0.9 % IR SOLN
Status: DC | PRN
  Administered 2022-03-24: 100 mL
  Administered 2022-03-24: 3000 mL

## 2022-03-24 MED ORDER — EPHEDRINE SULFATE (PRESSORS) 50 MG/ML IJ SOLN
INTRAMUSCULAR | Status: DC | PRN
  Administered 2022-03-24: 10 mg via INTRAVENOUS

## 2022-03-24 MED ORDER — PHENYLEPHRINE HCL (PRESSORS) 10 MG/ML IV SOLN
INTRAVENOUS | Status: DC | PRN
  Administered 2022-03-24 (×3): 160 ug via INTRAVENOUS

## 2022-03-24 MED ORDER — LATANOPROST 0.005 % OP SOLN
1.0000 [drp] | Freq: Every day | OPHTHALMIC | Status: DC
  Administered 2022-03-24 – 2022-03-29 (×6): 1 [drp] via OPHTHALMIC
  Filled 2022-03-24: qty 2.5

## 2022-03-24 MED ORDER — OXYCODONE HCL 5 MG/5ML PO SOLN: 5.0000 mg | Freq: Once | ORAL | Status: DC | PRN

## 2022-03-24 MED ORDER — METOCLOPRAMIDE HCL 5 MG PO TABS: 5.0000 mg | ORAL_TABLET | Freq: Three times a day (TID) | ORAL | Status: DC | PRN

## 2022-03-24 MED ORDER — METHYLENE BLUE 1 % INJ SOLN
INTRAVENOUS | Status: AC
  Filled 2022-03-24: qty 10

## 2022-03-24 MED ORDER — TRAMADOL HCL 50 MG PO TABS
50.0000 mg | ORAL_TABLET | Freq: Four times a day (QID) | ORAL | Status: DC | PRN
  Administered 2022-03-25 – 2022-03-29 (×7): 50 mg via ORAL
  Filled 2022-03-24 (×7): qty 1

## 2022-03-24 MED ORDER — SODIUM CHLORIDE (PF) 0.9 % IJ SOLN
INTRAMUSCULAR | Status: DC | PRN
  Administered 2022-03-24: 50 mL via INTRAMUSCULAR

## 2022-03-24 SURGICAL SUPPLY — 71 items
BLADE SAGITTAL AGGR TOOTH XLG (BLADE) ×2 IMPLANT
BNDG COHESIVE 6X5 TAN ST LF (GAUZE/BANDAGES/DRESSINGS) ×2 IMPLANT
CEMENT BONE 40GM (Cement) IMPLANT
CHLORAPREP W/TINT 26 (MISCELLANEOUS) ×4 IMPLANT
COVER BACK TABLE REUSABLE LG (DRAPES) ×2 IMPLANT
COVER SET STULBERG POSITIONER (MISCELLANEOUS) ×2 IMPLANT
DERMABOND ADVANCED .7 DNX12 (GAUZE/BANDAGES/DRESSINGS) ×2 IMPLANT
DRAPE 3/4 80X56 (DRAPES) ×2 IMPLANT
DRAPE IMP U-DRAPE 54X76 (DRAPES) ×2 IMPLANT
DRAPE INCISE IOBAN 66X60 STRL (DRAPES) ×2 IMPLANT
DRAPE POUCH INSTRU U-SHP 10X18 (DRAPES) ×2 IMPLANT
DRAPE U-SHAPE 47X51 STRL (DRAPES) ×2 IMPLANT
DRSG MEPILEX SACRM 8.7X9.8 (GAUZE/BANDAGES/DRESSINGS) ×2 IMPLANT
DRSG OPSITE POSTOP 4X10 (GAUZE/BANDAGES/DRESSINGS) IMPLANT
DRSG OPSITE POSTOP 4X12 (GAUZE/BANDAGES/DRESSINGS) IMPLANT
DRSG OPSITE POSTOP 4X8 (GAUZE/BANDAGES/DRESSINGS) IMPLANT
ELECT REM PT RETURN 9FT ADLT (ELECTROSURGICAL) ×1
ELECTRODE REM PT RTRN 9FT ADLT (ELECTROSURGICAL) ×2 IMPLANT
GLOVE BIO SURGEON STRL SZ8 (GLOVE) ×2 IMPLANT
GLOVE BIOGEL PI IND STRL 8 (GLOVE) ×2 IMPLANT
GLOVE PI ORTHO PRO STRL 7.5 (GLOVE) ×4 IMPLANT
GLOVE PI ORTHO PRO STRL SZ8 (GLOVE) ×4 IMPLANT
GLOVE PROTEXIS LATEX SZ 7.5 (GLOVE) ×1 IMPLANT
GLOVE SURG LATEX 7.5 PF (GLOVE) ×2 IMPLANT
GLOVE SURG SYN 7.5  E (GLOVE) ×1
GLOVE SURG SYN 7.5 E (GLOVE) ×1 IMPLANT
GLOVE SURG SYN 7.5 PF PI (GLOVE) ×2 IMPLANT
GOWN STRL REUS W/ TWL LRG LVL3 (GOWN DISPOSABLE) ×2 IMPLANT
GOWN STRL REUS W/ TWL XL LVL3 (GOWN DISPOSABLE) ×4 IMPLANT
GOWN STRL REUS W/TWL LRG LVL3 (GOWN DISPOSABLE) ×1
GOWN STRL REUS W/TWL XL LVL3 (GOWN DISPOSABLE) ×2
HEAD CERAMIC V40 BIOLOX DEL 28 (Orthopedic Implant) IMPLANT
HEAD FEM UNIV HIP 28X56 (Head) IMPLANT
HOOD PEEL AWAY T7 (MISCELLANEOUS) ×4 IMPLANT
IV NS IRRIG 3000ML ARTHROMATIC (IV SOLUTION) ×2 IMPLANT
JET LAVAGE IRRISEPT WOUND (IRRIGATION / IRRIGATOR) ×1
KIT PREP HIP W/CEMENT RESTRICT (Miscellaneous) IMPLANT
KIT PREPARATION TOTAL HIP (Miscellaneous) ×1 IMPLANT
KIT TURNOVER KIT A (KITS) ×2 IMPLANT
LAVAGE JET IRRISEPT WOUND (IRRIGATION / IRRIGATOR) IMPLANT
MANIFOLD NEPTUNE II (INSTRUMENTS) ×2 IMPLANT
MAT ABSORB  FLUID 56X50 GRAY (MISCELLANEOUS) ×1
MAT ABSORB FLUID 56X50 GRAY (MISCELLANEOUS) ×2 IMPLANT
NDL SPNL 20GX3.5 QUINCKE YW (NEEDLE) ×2 IMPLANT
NEEDLE SPNL 20GX3.5 QUINCKE YW (NEEDLE) ×2 IMPLANT
PACK HIP PROSTHESIS (MISCELLANEOUS) ×2 IMPLANT
PENCIL SMOKE EVACUATOR (MISCELLANEOUS) ×2 IMPLANT
PILLOW ABDUCTION FOAM SM (MISCELLANEOUS) ×2 IMPLANT
PULSAVAC PLUS IRRIG FAN TIP (DISPOSABLE) ×1
RETRIEVER SUT HEWSON (MISCELLANEOUS) ×2 IMPLANT
SLEEVE SCD COMPRESS KNEE MED (STOCKING) ×2 IMPLANT
SOLUTION IRRIG SURGIPHOR (IV SOLUTION) ×2 IMPLANT
SPACER DIST ACCOLADE LGE 17 (Spacer) IMPLANT
STEM FEM CMT SZ7 V40 40X158 (Stem) IMPLANT
SUT BONE WAX W31G (SUTURE) ×2 IMPLANT
SUT DVC 2 QUILL PDO  T11 36X36 (SUTURE) ×1
SUT DVC 2 QUILL PDO T11 36X36 (SUTURE) ×2 IMPLANT
SUT ETHIBOND #5 BRAIDED 30INL (SUTURE) ×2 IMPLANT
SUT QUILL MONODERM 3-0 PS-2 (SUTURE) ×2 IMPLANT
SUT VIC AB 0 CT1 36 (SUTURE) ×2 IMPLANT
SUT VIC AB 2-0 CT2 27 (SUTURE) ×4 IMPLANT
SUT VICRYL 1-0 27IN ABS (SUTURE) ×1
SUTURE VICRYL 1-0 27IN ABS (SUTURE) ×2 IMPLANT
SYR 30ML LL (SYRINGE) ×4 IMPLANT
TIP FAN IRRIG PULSAVAC PLUS (DISPOSABLE) ×2 IMPLANT
TOWEL OR 17X26 4PK STRL BLUE (TOWEL DISPOSABLE) IMPLANT
TOWER CARTRIDGE SMART MIX (DISPOSABLE) IMPLANT
TRAP FLUID SMOKE EVACUATOR (MISCELLANEOUS) ×2 IMPLANT
TUBE KAMVAC SUCTION (TUBING) ×2 IMPLANT
WAND WEREWOLF FASTSEAL 6.0 (MISCELLANEOUS) ×2 IMPLANT
WATER STERILE IRR 1000ML POUR (IV SOLUTION) ×2 IMPLANT

## 2022-03-24 NOTE — Consult Note (Signed)
ORTHOPAEDIC CONSULTATION  REQUESTING PHYSICIAN: Lorella Nimrod, MD  Chief Complaint:   Right hip fracture  History of Present Illness: Eric Hicks is a 88 y.o. male with a history of hyperlipidemia, hypertension who presented to the emergency room at [facility] after a trip and [place] on his right hip.  Patient denies any head trauma loss of consciousness or other injuries except for hitting his right elbow.  Patient was not able to ambulate after the fall and was brought to the emergency room and found to have a right femoral neck fracture.  He is a former tobacco user but no active tobacco smoking.  He denies any alcohol or recreational drug use.  He denies any chest pain, shortness of breath, nausea, vomiting, vision changes, or any history of pre-existing pain in his hip.  Past Medical History:  Diagnosis Date   BPH (benign prostatic hyperplasia)    managed by Olena Heckle   COPD (chronic obstructive pulmonary disease) ([place])    Elevated PSA, between 10 and less than 20 ng/ml    Olena Heckle, watchful waiting   Hypertension    Osteoporosis    by DEXA   Past Surgical History:  Procedure Laterality Date   TYMPANOSTOMY TUBE PLACEMENT  2017   Social History   Socioeconomic History   Marital status: Widowed    Spouse name: Not on file   Number of children: Not on file   Years of education: Not on file   Highest education level: Not on file  Occupational History   Not on file  Tobacco Use   Smoking status: Former   Smokeless tobacco: Never  Vaping Use   Vaping Use: Never used  Substance and Sexual Activity   Alcohol use: No   Drug use: Not on file   Sexual activity: Never  Other Topics Concern   Not on file  Social History Narrative   Regular exercise-yew   Social Determinants of Health   Financial Resource Strain: Low Risk  (10/07/2021)   Overall Financial Resource Strain  (CARDIA)    Difficulty of Paying Living Expenses: Not hard at all  Food Insecurity: No Food Insecurity (03/24/2022)   Hunger Vital Sign    Worried About Running Out of Food in the Last Year: Never true    [place] in the Last Year: Never true  Transportation Needs: No Transportation Needs (03/24/2022)   PRAPARE - Hydrologist (Medical): No    Lack of Transportation (Non-Medical): No  Physical Activity: Not on file  Stress: No Stress Concern Present (10/07/2021)   [place]    Feeling of Stress : Not at all  Social Connections: Unknown (10/07/2021)   Social Connection and Isolation Panel [NHANES]    Frequency of Communication with Friends and Family: More than three times a week    Frequency of Social Gatherings with Friends and Family: Not on file    Attends Religious Services: Not on file    Active Member of Clubs or Organizations: Not on file    Attends Archivist Meetings: Not on file    Marital Status: Widowed   Family History  Problem Relation Age of Onset   Diabetes Mother    Hypertension Father    Cancer Brother 3       multiple myeloma   Allergies  Allergen Reactions   Tylenol [Acetaminophen] Rash   Prior to Admission medications  Medication Sig Start Date End Date Taking? Authorizing Provider  ADVAIR DISKUS 100-50 MCG/ACT AEPB INHALE 1 PUFF INTO THE LUNGS TWICE A DAY 02/01/22   Crecencio Mc, MD  Aflibercept (EYLEA IO) Inject into the eye.    [provider]  AMBULATORY NON FORMULARY MEDICATION Joint Advantage Gold 2 tablets daily    [provider]  atorvastatin (LIPITOR) 20 MG tablet TAKE 1 TABLET BY MOUTH EVERY OTHER DAY 08/28/21   Crecencio Mc, MD  calcium carbonate (OS-CAL) 600 MG TABS Take 600 mg by mouth daily with breakfast.     [provider]  carvedilol (COREG) 6.25 MG tablet Take 6.25 mg by mouth 2 (two) times  daily. 05/19/19   [provider]  dorzolamide-timolol (COSOPT) 22.3-6.8 MG/ML ophthalmic solution TAKE 1 DROP(S) IN RIGHT EYE 2 TIMES A DAY 07/29/17   [provider]  doxazosin (CARDURA) 1 MG tablet TAKE 1 TABLET BY MOUTH EVERY DAY 09/28/21   Crecencio Mc, MD  furosemide (LASIX) 20 MG tablet TAKE 2 TABLETS BY MOUTH DAILY AS NEEDED 12/20/21   Dutch Quint B, FNP  latanoprost (XALATAN) 0.005 % ophthalmic solution Place 1 drop into both eyes at bedtime. 03/21/18   [provider]  leuprolide, 6 Month, (ELIGARD) 45 MG injection Inject 45 mg into the skin every 6 (six) months.    [provider]  Vitamin D, Cholecalciferol, 25 MCG (1000 UT) CAPS Take 1 capsule by mouth daily.    [provider]   DG FEMUR, MIN 2 VIEWS RIGHT  Result Date: 03/24/2022 CLINICAL DATA:  Closed right hip fracture. EXAM: RIGHT FEMUR 2 VIEWS COMPARISON:  Pelvis and right hip radiographs 03/23/2022 FINDINGS: There is diffuse decreased bone mineralization. Redemonstration of mildly impacted proximal right femoral neck fracture. There is mild anterior apex angulation and likely approximately 8 mm anterior displacement of the distal fracture component with respect to the proximal fracture component. Mild-to-moderate right femoroacetabular joint space narrowing. Mild tricompartmental osteoarthritis of the right knee. Vascular phleboliths overlie the pelvis. Mild-to-moderate arterial vascular calcifications. IMPRESSION: Acute mildly impacted and angulated proximal right femoral neck fracture, similar to prior. Electronically Signed   By: Yvonne Kendall M.D.   On: 03/24/2022 08:40   CT Head Wo Contrast  Result Date: 03/23/2022 CLINICAL DATA:  Head trauma, minor (Age >= 65y); Neck trauma (Age >= 65y) EXAM: CT HEAD WITHOUT CONTRAST CT CERVICAL SPINE WITHOUT CONTRAST TECHNIQUE: Multidetector CT imaging of the head and cervical spine was performed following the standard protocol without intravenous  contrast. Multiplanar CT image reconstructions of the cervical spine were also generated. RADIATION DOSE REDUCTION: This exam was performed according to the departmental dose-optimization program which includes automated exposure control, adjustment of the mA and/or kV according to patient size and/or use of iterative reconstruction technique. COMPARISON:  CT chest 01/12/2022, CT chest 01/09/2021 FINDINGS: CT HEAD FINDINGS Brain: Cerebral ventricle sizes are concordant with the degree of cerebral volume loss. Patchy and confluent areas of decreased attenuation are noted throughout the deep and periventricular white matter of the cerebral hemispheres bilaterally, compatible with chronic microvascular ischemic disease. No evidence of large-territorial acute infarction. No parenchymal hemorrhage. No mass lesion. No extra-axial collection. No mass effect or midline shift. No hydrocephalus. Basilar cisterns are patent. Vascular: No hyperdense vessel. Atherosclerotic calcifications are present within the cavernous internal carotid and vertebral arteries. Skull: No acute fracture or focal lesion. Sinuses/Orbits: Paranasal sinuses and mastoid air cells are clear. Bilateral lens replacement. Otherwise the orbits are unremarkable. Other: None.  CT CERVICAL SPINE FINDINGS Alignment: Normal. Skull base and vertebrae: Multilevel severe degenerative changes spine most prominent at the C1-C2 and C5-C6 levels. Associated moderate to severe osseous neural foraminal stenosis at the left C3-C4 level. No acute fracture. No aggressive appearing focal osseous lesion or focal pathologic process. Soft tissues and spinal canal: No prevertebral fluid or swelling. No visible canal hematoma. Upper chest: Partially visualized right apical masslike airspace opacity measuring up to at least 4.2 cm. Other: None. IMPRESSION: 1. No acute intracranial abnormality. 2. No acute displaced fracture or traumatic listhesis of the cervical spine. 3.  Partially visualized right apical masslike airspace opacity measuring up to at least 4.2 cm. 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 Electronically Signed   By: Iven Finn M.D.   On: 03/23/2022 20:56   CT Cervical Spine Wo Contrast  Result Date: 03/23/2022 CLINICAL DATA:  Head trauma, minor (Age >= 65y); Neck trauma (Age >= 65y) EXAM: CT HEAD WITHOUT CONTRAST CT CERVICAL SPINE WITHOUT CONTRAST TECHNIQUE: Multidetector CT imaging of the head and cervical spine was performed following the standard protocol without intravenous contrast. Multiplanar CT image reconstructions of the cervical spine were also generated. RADIATION DOSE REDUCTION: This exam was performed according to the departmental dose-optimization program which includes automated exposure control, adjustment of the mA and/or kV according to patient size and/or use of iterative reconstruction technique. COMPARISON:  CT chest 01/12/2022, CT chest 01/09/2021 FINDINGS: CT HEAD FINDINGS Brain: Cerebral ventricle sizes are concordant with the degree of cerebral volume loss. Patchy and confluent areas of decreased attenuation are noted throughout the deep and periventricular white matter of the cerebral hemispheres bilaterally, compatible with chronic microvascular ischemic disease. No evidence of large-territorial acute infarction. No parenchymal hemorrhage. No mass lesion. No extra-axial collection. No mass effect or midline shift. No hydrocephalus. Basilar cisterns are patent. Vascular: No hyperdense vessel. Atherosclerotic calcifications are present within the cavernous internal carotid and vertebral arteries. Skull: No acute fracture or focal lesion. Sinuses/Orbits: Paranasal sinuses and mastoid air cells are clear. Bilateral lens replacement. Otherwise the orbits are unremarkable. Other: None. CT CERVICAL SPINE FINDINGS Alignment: Normal. Skull base and vertebrae:  Multilevel severe degenerative changes spine most prominent at the C1-C2 and C5-C6 levels. Associated moderate to severe osseous neural foraminal stenosis at the left C3-C4 level. No acute fracture. No aggressive appearing focal osseous lesion or focal pathologic process. Soft tissues and spinal canal: No prevertebral fluid or swelling. No visible canal hematoma. Upper chest: Partially visualized right apical masslike airspace opacity measuring up to at least 4.2 cm. Other: None. IMPRESSION: 1. No acute intracranial abnormality. 2. No acute displaced fracture or traumatic listhesis of the cervical spine. 3. Partially visualized right apical masslike airspace opacity measuring up to at least 4.2 cm. 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 Electronically Signed   By: Iven Finn M.D.   On: 03/23/2022 20:56   DG Elbow Complete Right  Result Date: 03/23/2022 CLINICAL DATA:  Fall EXAM: RIGHT ELBOW - COMPLETE 4 VIEW COMPARISON:  None Available. FINDINGS: There is no evidence of fracture, dislocation, or joint effusion. There is no evidence of arthropathy or other focal bone abnormality. Soft tissues are unremarkable. IMPRESSION: Negative. Electronically Signed   By: Sammie Bench M.D.   On: 03/23/2022 20:55   DG Chest 1 View  Result Date: 03/23/2022 CLINICAL DATA:  Fall. EXAM: CHEST  1 VIEW COMPARISON:  January 16, 2019. FINDINGS:  Stable cardiomegaly. Both lungs are clear. The visualized skeletal structures are unremarkable. IMPRESSION: No active disease. Electronically Signed   By: Marijo Conception M.D.   On: 03/23/2022 20:23   DG Hip Unilat W or Wo Pelvis 2-3 Views Right  Result Date: 03/23/2022 CLINICAL DATA:  Right hip pain after fall. EXAM: DG HIP (WITH OR WITHOUT PELVIS) 2-3V RIGHT COMPARISON:  April 11, 2014. FINDINGS: Mildly impacted right femoral neck fracture is noted. Left hip is unremarkable. IMPRESSION:  Mildly impacted right femoral neck fracture Electronically Signed   By: Marijo Conception M.D.   On: 03/23/2022 20:22    Positive ROS: All other systems have been reviewed and were otherwise negative with the exception of those mentioned in the HPI and as above.  Physical Exam: General:  Alert, no acute distress Psychiatric:  Patient is competent for consent with normal mood and affect   Cardiovascular:  No pedal edema Respiratory:  No wheezing, non-labored breathing GI:  Abdomen is soft and non-tender Skin:  No lesions in the area of chief complaint Neurologic:  Sensation intact distally Lymphatic:  No axillary or cervical lymphadenopathy  Orthopedic Exam:  Right lower extremity short externally rotated Skin is intact over the hip tender to palpation over the anterior hip Pain with any motion of the right lower extremity No tenderness to palpation over the knee ankle or foot Able to dorsiflex and plantarflex the foot Neurovascularly intact to the foot  Secondary survey No pain with logroll or simulated axial loading to the left lower extremity No tenderness to palpation over bony prominences of the upper extremities Some mild tenderness over the right elbow but able to range the elbow there is a dressing in place over the skin tear. No tenderness to over palpation cervical spine, no bony step-off  X-rays:  AP and crosstable lateral x-rays of the right hip and femur reviewed showing a displaced femoral neck fracture with apex anterior angulation  Assessment: Displaced right femoral neck fracture  Plan: An extensive conversation with the patient and reviewed the nature of his hip fracture and possible treatment options.  The patient was alert and oriented during this conversation.  We discussed hip fracture healing and operative versus nonoperative management including the risks and benefits of each.  Given the displacement on the lateral x-ray the patient is not a good candidate for  a pinning and will need a hemiarthroplasty.  I discussed the risks of hemiarthroplasty with the patient including but not limited to blood clot, stroke, leg length inequality, infection, dislocation, complications from cement use, need for additional procedures in the future, continued pain, periprosthetic fracture, and even death.  Under a shared decision making conversation the patient has elected to move forward with a right hip hemiarthroplasty as treatment for his hip fracture.  We will plan to perform the surgery this afternoon 03/24/2022.  We will keep the patient on strict bedrest nonweightbearing, n.p.o., and hold any anticoagulation for surgery.  All questions answered and the patient agrees with the above plan.    Steffanie Rainwater MD  Beeper #:  [phone]  03/24/2022 9:25 AM

## 2022-03-24 NOTE — Assessment & Plan Note (Signed)
Pressure intermittently elevated. -Continue carvedilol and Cardura

## 2022-03-24 NOTE — Progress Notes (Signed)
Progress Note   Patient: Eric Hicks KGY:[id number] DOB: Apr 18, 1927 DOA: 03/23/2022     1 DOS: the patient was seen and examined on 03/24/2022   Brief hospital course: Eric Hicks is a 88 year old male with history of hyperlipidemia, hypertension, impaired fasting glucose, history of whitecoat syndrome, who presents emergency department for chief concerns of mechanical fall and falling on his right hip and elbow, resulted and mildly impacted right femoral neck fracture  Patient denies loss of consciousness and hitting his head.  Initial vitals in the emergency department showed temperature of 97.6, respiration rate 18, heart rate 78, blood pressure 158/106, SpO2 of 96% on room air.  Labs in the ED showed serum sodium 142, potassium 4.4, bicarb 26, BUN 28, serum creatinine 1.31, GFR 51, nonfasting glucose 122. WBC 6.6, hemoglobin 13.1, platelets 144. CT head, CT cervical spine, CT chest and EKG elbow was without any acute abnormality. DG hip with mildly impacted right femoral neck fracture  ED treatment: Tdap booster has been ordered for injection.  Oxycodone 5 mg p.o. one-time dose.  11/29: Hemodynamically stable on 3 L oxygen, labs pertinent for leukocytosis, most likely reactive, hemoglobin decreased to 12.5 from 13.1, BMP with slight worsening of BUN 31 and creatinine 1.40 which remained around his baseline of 1.3-1.4. Going to the OR with orthopedic surgery today.  Assessment and Plan: * Closed right hip fracture ([place]) Secondary to mechanical fall. Most of 50+ consulted and he is going to hold off on hemiarthroplasty today - Fall precaution -Continue with pain management -PT/OT evaluation after the surgery  Paroxysmal atrial fibrillation ([facility]) Chronic atrial fibrillation and sees [facility] clinic cardiology - Per note on 02/11/2022, patient has declined anticoagulation therefore he is not on anticoagulation - Patient is on carvedilol 6.25 mg p.o. twice daily, this will be  resumed  White coat syndrome with diagnosis of hypertension Pressure intermittently elevated. -Continue carvedilol and Cardura  Nonischemic dilated cardiomyopathy ([place]) Patient has a history of nonischemic cardiomyopathy with last noted EF of 30 to 35%.  Clinically appears euvolemic. -Holding home Lasix for surgery -Continue home carvedilol and statin  BPH (benign prostatic hyperplasia) -Continue Cardura  Stage 3a chronic kidney disease (CKD) (HCC) - At baseline -Monitor renal function -Avoid nephrotoxins  Hyperlipidemia - Atorvastatin 20 mg daily resumed   Subjective: Patient was seen and examined today.  Waiting for his surgery.  Pain seems well-controlled.  Multiple family members at bedside.  Physical Exam: Vitals:   03/23/22 2029 03/23/22 2233 03/24/22 0027 03/24/22 0737  BP: (!) 158/106 (!) 157/96 (!) 163/102 129/66  Pulse: 78 89 89 66  Resp: 18 19 (!) 24   Temp: 97.6 F (36.4 C)  97.8 F (36.6 C) 99.3 F (37.4 C)  TempSrc: Oral     SpO2: 96% 90% 92% 92%  Weight: 77.1 kg     Height: 5\' 10"  (1.778 m)      General.  Frail elderly man, in no acute distress. Pulmonary.  Lungs clear bilaterally, normal respiratory effort. CV.  Regular rate and rhythm, no JVD, rub or murmur. Abdomen.  Soft, nontender, nondistended, BS positive. CNS.  Alert and oriented .  No focal neurologic deficit. Extremities.  No edema, no cyanosis, pulses intact and symmetrical. Psychiatry.  Judgment and insight appears normal.   Data Reviewed: Prior data reviewed  Family Communication: Discussed with 2 sons and a daughter at bedside.  Disposition: Status is: Inpatient Remains inpatient appropriate because: Severity of illness  Planned Discharge Destination:  To be determined  Time spent: 45 minutes  This record has been created using Systems analyst. Errors have been sought and corrected,but may not always be located. Such creation errors do not reflect on the  standard of care.   Author: Lorella Nimrod, MD 03/24/2022 2:24 PM  For on call review [email or web address].

## 2022-03-24 NOTE — H&P (View-Only) (Signed)
ORTHOPAEDIC CONSULTATION  REQUESTING PHYSICIAN: Lorella Nimrod, MD  Chief Complaint:   Right hip fracture  History of Present Illness: Eric Hicks is a 88 y.o. male with a history of hyperlipidemia, hypertension who presented to the emergency room at [facility] after a [place] and [place] on his right hip.  Patient denies any head trauma loss of consciousness or other injuries except for hitting his right elbow.  Patient was not able to ambulate after the fall and was brought to the emergency room and found to have a right femoral neck fracture.  He is a former tobacco user but no active tobacco smoking.  He denies any alcohol or recreational drug use.  He denies any chest pain, shortness of breath, nausea, vomiting, vision changes, or any history of pre-existing pain in his hip.  Past Medical History:  Diagnosis Date   BPH (benign prostatic hyperplasia)    managed by Olena Heckle   COPD (chronic obstructive pulmonary disease) ([place])    Elevated PSA, between 10 and less than 20 ng/ml    Olena Heckle, watchful waiting   Hypertension    Osteoporosis    by DEXA   Past Surgical History:  Procedure Laterality Date   TYMPANOSTOMY TUBE PLACEMENT  2017   Social History   Socioeconomic History   Marital status: Widowed    Spouse name: Not on file   Number of children: Not on file   Years of education: Not on file   Highest education level: Not on file  Occupational History   Not on file  Tobacco Use   Smoking status: Former   Smokeless tobacco: Never  Vaping Use   Vaping Use: Never used  Substance and Sexual Activity   Alcohol use: No   Drug use: Not on file   Sexual activity: Never  Other Topics Concern   Not on file  Social History Narrative   Regular exercise-yew   Social Determinants of Health   Financial Resource Strain: Low Risk  (10/07/2021)   Overall Financial Resource Strain  (CARDIA)    Difficulty of Paying Living Expenses: Not hard at all  Food Insecurity: No Food Insecurity (03/24/2022)   Hunger Vital Sign    Worried About Running Out of Food in the Last Year: Never true    [place] in the Last Year: Never true  Transportation Needs: No Transportation Needs (03/24/2022)   PRAPARE - Hydrologist (Medical): No    Lack of Transportation (Non-Medical): No  Physical Activity: Not on file  Stress: No Stress Concern Present (10/07/2021)   [place]    Feeling of Stress : Not at all  Social Connections: Unknown (10/07/2021)   Social Connection and Isolation Panel [NHANES]    Frequency of Communication with Friends and Family: More than three times a week    Frequency of Social Gatherings with Friends and Family: Not on file    Attends Religious Services: Not on file    Active Member of Clubs or Organizations: Not on file    Attends Archivist Meetings: Not on file    Marital Status: Widowed   Family History  Problem Relation Age of Onset   Diabetes Mother    Hypertension Father    Cancer Brother 72       multiple myeloma   Allergies  Allergen Reactions   Tylenol [Acetaminophen] Rash   Prior to Admission medications  Medication Sig Start Date End Date Taking? Authorizing Provider  ADVAIR DISKUS 100-50 MCG/ACT AEPB INHALE 1 PUFF INTO THE LUNGS TWICE A DAY 02/01/22   Crecencio Mc, MD  Aflibercept (EYLEA IO) Inject into the eye.    [provider]  AMBULATORY NON FORMULARY MEDICATION Joint Advantage Gold 2 tablets daily    [provider]  atorvastatin (LIPITOR) 20 MG tablet TAKE 1 TABLET BY MOUTH EVERY OTHER DAY 08/28/21   Crecencio Mc, MD  calcium carbonate (OS-CAL) 600 MG TABS Take 600 mg by mouth daily with breakfast.     [provider]  carvedilol (COREG) 6.25 MG tablet Take 6.25 mg by mouth 2 (two) times  daily. 05/19/19   [provider]  dorzolamide-timolol (COSOPT) 22.3-6.8 MG/ML ophthalmic solution TAKE 1 DROP(S) IN RIGHT EYE 2 TIMES A DAY 07/29/17   [provider]  doxazosin (CARDURA) 1 MG tablet TAKE 1 TABLET BY MOUTH EVERY DAY 09/28/21   Crecencio Mc, MD  furosemide (LASIX) 20 MG tablet TAKE 2 TABLETS BY MOUTH DAILY AS NEEDED 12/20/21   Dutch Quint B, FNP  latanoprost (XALATAN) 0.005 % ophthalmic solution Place 1 drop into both eyes at bedtime. 03/21/18   [provider]  leuprolide, 6 Month, (ELIGARD) 45 MG injection Inject 45 mg into the skin every 6 (six) months.    [provider]  Vitamin D, Cholecalciferol, 25 MCG (1000 UT) CAPS Take 1 capsule by mouth daily.    [provider]   DG FEMUR, MIN 2 VIEWS RIGHT  Result Date: 03/24/2022 CLINICAL DATA:  Closed right hip fracture. EXAM: RIGHT FEMUR 2 VIEWS COMPARISON:  Pelvis and right hip radiographs 03/23/2022 FINDINGS: There is diffuse decreased bone mineralization. Redemonstration of mildly impacted proximal right femoral neck fracture. There is mild anterior apex angulation and likely approximately 8 mm anterior displacement of the distal fracture component with respect to the proximal fracture component. Mild-to-moderate right femoroacetabular joint space narrowing. Mild tricompartmental osteoarthritis of the right knee. Vascular phleboliths overlie the pelvis. Mild-to-moderate arterial vascular calcifications. IMPRESSION: Acute mildly impacted and angulated proximal right femoral neck fracture, similar to prior. Electronically Signed   By: Yvonne Kendall M.D.   On: 03/24/2022 08:40   CT Head Wo Contrast  Result Date: 03/23/2022 CLINICAL DATA:  Head trauma, minor (Age >= 65y); Neck trauma (Age >= 65y) EXAM: CT HEAD WITHOUT CONTRAST CT CERVICAL SPINE WITHOUT CONTRAST TECHNIQUE: Multidetector CT imaging of the head and cervical spine was performed following the standard protocol without intravenous  contrast. Multiplanar CT image reconstructions of the cervical spine were also generated. RADIATION DOSE REDUCTION: This exam was performed according to the departmental dose-optimization program which includes automated exposure control, adjustment of the mA and/or kV according to patient size and/or use of iterative reconstruction technique. COMPARISON:  CT chest 01/12/2022, CT chest 01/09/2021 FINDINGS: CT HEAD FINDINGS Brain: Cerebral ventricle sizes are concordant with the degree of cerebral volume loss. Patchy and confluent areas of decreased attenuation are noted throughout the deep and periventricular white matter of the cerebral hemispheres bilaterally, compatible with chronic microvascular ischemic disease. No evidence of large-territorial acute infarction. No parenchymal hemorrhage. No mass lesion. No extra-axial collection. No mass effect or midline shift. No hydrocephalus. Basilar cisterns are patent. Vascular: No hyperdense vessel. Atherosclerotic calcifications are present within the cavernous internal carotid and vertebral arteries. Skull: No acute fracture or focal lesion. Sinuses/Orbits: Paranasal sinuses and mastoid air cells are clear. Bilateral lens replacement. Otherwise the orbits are unremarkable. Other: None.  CT CERVICAL SPINE FINDINGS Alignment: Normal. Skull base and vertebrae: Multilevel severe degenerative changes spine most prominent at the C1-C2 and C5-C6 levels. Associated moderate to severe osseous neural foraminal stenosis at the left C3-C4 level. No acute fracture. No aggressive appearing focal osseous lesion or focal pathologic process. Soft tissues and spinal canal: No prevertebral fluid or swelling. No visible canal hematoma. Upper chest: Partially visualized right apical masslike airspace opacity measuring up to at least 4.2 cm. Other: None. IMPRESSION: 1. No acute intracranial abnormality. 2. No acute displaced fracture or traumatic listhesis of the cervical spine. 3.  Partially visualized right apical masslike airspace opacity measuring up to at least 4.2 cm. 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 Electronically Signed   By: Iven Finn M.D.   On: 03/23/2022 20:56   CT Cervical Spine Wo Contrast  Result Date: 03/23/2022 CLINICAL DATA:  Head trauma, minor (Age >= 65y); Neck trauma (Age >= 65y) EXAM: CT HEAD WITHOUT CONTRAST CT CERVICAL SPINE WITHOUT CONTRAST TECHNIQUE: Multidetector CT imaging of the head and cervical spine was performed following the standard protocol without intravenous contrast. Multiplanar CT image reconstructions of the cervical spine were also generated. RADIATION DOSE REDUCTION: This exam was performed according to the departmental dose-optimization program which includes automated exposure control, adjustment of the mA and/or kV according to patient size and/or use of iterative reconstruction technique. COMPARISON:  CT chest 01/12/2022, CT chest 01/09/2021 FINDINGS: CT HEAD FINDINGS Brain: Cerebral ventricle sizes are concordant with the degree of cerebral volume loss. Patchy and confluent areas of decreased attenuation are noted throughout the deep and periventricular white matter of the cerebral hemispheres bilaterally, compatible with chronic microvascular ischemic disease. No evidence of large-territorial acute infarction. No parenchymal hemorrhage. No mass lesion. No extra-axial collection. No mass effect or midline shift. No hydrocephalus. Basilar cisterns are patent. Vascular: No hyperdense vessel. Atherosclerotic calcifications are present within the cavernous internal carotid and vertebral arteries. Skull: No acute fracture or focal lesion. Sinuses/Orbits: Paranasal sinuses and mastoid air cells are clear. Bilateral lens replacement. Otherwise the orbits are unremarkable. Other: None. CT CERVICAL SPINE FINDINGS Alignment: Normal. Skull base and vertebrae:  Multilevel severe degenerative changes spine most prominent at the C1-C2 and C5-C6 levels. Associated moderate to severe osseous neural foraminal stenosis at the left C3-C4 level. No acute fracture. No aggressive appearing focal osseous lesion or focal pathologic process. Soft tissues and spinal canal: No prevertebral fluid or swelling. No visible canal hematoma. Upper chest: Partially visualized right apical masslike airspace opacity measuring up to at least 4.2 cm. Other: None. IMPRESSION: 1. No acute intracranial abnormality. 2. No acute displaced fracture or traumatic listhesis of the cervical spine. 3. Partially visualized right apical masslike airspace opacity measuring up to at least 4.2 cm. 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 Electronically Signed   By: Iven Finn M.D.   On: 03/23/2022 20:56   DG Elbow Complete Right  Result Date: 03/23/2022 CLINICAL DATA:  Fall EXAM: RIGHT ELBOW - COMPLETE 4 VIEW COMPARISON:  None Available. FINDINGS: There is no evidence of fracture, dislocation, or joint effusion. There is no evidence of arthropathy or other focal bone abnormality. Soft tissues are unremarkable. IMPRESSION: Negative. Electronically Signed   By: Sammie Bench M.D.   On: 03/23/2022 20:55   DG Chest 1 View  Result Date: 03/23/2022 CLINICAL DATA:  Fall. EXAM: CHEST  1 VIEW COMPARISON:  January 16, 2019. FINDINGS:  Stable cardiomegaly. Both lungs are clear. The visualized skeletal structures are unremarkable. IMPRESSION: No active disease. Electronically Signed   By: Marijo Conception M.D.   On: 03/23/2022 20:23   DG Hip Unilat W or Wo Pelvis 2-3 Views Right  Result Date: 03/23/2022 CLINICAL DATA:  Right hip pain after fall. EXAM: DG HIP (WITH OR WITHOUT PELVIS) 2-3V RIGHT COMPARISON:  April 11, 2014. FINDINGS: Mildly impacted right femoral neck fracture is noted. Left hip is unremarkable. IMPRESSION:  Mildly impacted right femoral neck fracture Electronically Signed   By: Marijo Conception M.D.   On: 03/23/2022 20:22    Positive ROS: All other systems have been reviewed and were otherwise negative with the exception of those mentioned in the HPI and as above.  Physical Exam: General:  Alert, no acute distress Psychiatric:  Patient is competent for consent with normal mood and affect   Cardiovascular:  No pedal edema Respiratory:  No wheezing, non-labored breathing GI:  Abdomen is soft and non-tender Skin:  No lesions in the area of chief complaint Neurologic:  Sensation intact distally Lymphatic:  No axillary or cervical lymphadenopathy  Orthopedic Exam:  Right lower extremity short externally rotated Skin is intact over the hip tender to palpation over the anterior hip Pain with any motion of the right lower extremity No tenderness to palpation over the knee ankle or foot Able to dorsiflex and plantarflex the foot Neurovascularly intact to the foot  Secondary survey No pain with logroll or simulated axial loading to the left lower extremity No tenderness to palpation over bony prominences of the upper extremities Some mild tenderness over the right elbow but able to range the elbow there is a dressing in place over the skin tear. No tenderness to over palpation cervical spine, no bony step-off  X-rays:  AP and crosstable lateral x-rays of the right hip and femur reviewed showing a displaced femoral neck fracture with apex anterior angulation  Assessment: Displaced right femoral neck fracture  Plan: An extensive conversation with the patient and reviewed the nature of his hip fracture and possible treatment options.  The patient was alert and oriented during this conversation.  We discussed hip fracture healing and operative versus nonoperative management including the risks and benefits of each.  Given the displacement on the lateral x-ray the patient is not a good candidate for  a pinning and will need a hemiarthroplasty.  I discussed the risks of hemiarthroplasty with the patient including but not limited to blood clot, stroke, leg length inequality, infection, dislocation, complications from cement use, need for additional procedures in the future, continued pain, periprosthetic fracture, and even death.  Under a shared decision making conversation the patient has elected to move forward with a right hip hemiarthroplasty as treatment for his hip fracture.  We will plan to perform the surgery this afternoon 03/24/2022.  We will keep the patient on strict bedrest nonweightbearing, n.p.o., and hold any anticoagulation for surgery.  All questions answered and the patient agrees with the above plan.    Steffanie Rainwater MD  Beeper #:  [phone]  03/24/2022 9:25 AM

## 2022-03-24 NOTE — Anesthesia Procedure Notes (Signed)
Date/Time: 03/24/2022 4:20 PM  Performed by: Nelda Marseille, CRNAPre-anesthesia Checklist: Patient identified, Emergency Drugs available, Suction available, Patient being monitored and Timeout performed Oxygen Delivery Method: Simple face mask

## 2022-03-24 NOTE — Op Note (Signed)
Patient Name: Eric Hicks  YPP:[id number]  Pre-Operative Diagnosis: Right Femoral Neck Fracture  Post-Operative Diagnosis: (same)  Procedure: Right Hip Hemiarthroplasty  Components/Implants:  Stem Accolade C #7 High Offset Cemented  Head 28/4mm bipolar +66mm  Date of Surgery: 03/24/2022  Surgeon: Steffanie Rainwater MD  Assistant: Dorise Hiss PA (present and scrubbed throughout the case, critical for assistance with exposure, retraction, instrumentation, and closure)   Anesthesiologist: Wynetta Emery  Anesthesia: Spinal  IVF:1000cc  EBL: 458KD  Complications: None   Brief history: The patient is a 88 year old male who presented to the [facility] emergency room with a right femoral neck fracture after a mechanical fall. The risks and benefits of hip hemiarthroplasty as definitive surgical treatment were discussed with the patient and family, who opted to proceed with the operation.  .  All preoperative films were reviewed and an appropriate surgical plan was made prior to surgery.   Description of procedure: The patient was brought to the operating room where laterality was confirmed by all those present to be the right side.  The patient was moved to the table and administered spinal anesthesia. Patient was given an intravenous dose of antibiotics for surgical prophylaxis and TXA . The patient was positioned in lateral decubitus position with all bony prominences well-padded.  Surgical site was prepped with alcohol and chlorhexidine.  Surgical site over the hip was draped in typical sterile fashion with multiple layers of adhesive and nonadhesive drapes.  The incision site over the greater trochanter posteriorly was marked out with a sterile marker.   Surgical timeout was then called with participation of all staff in the room the patient was confirmed and laterality again confirmed.  An incision was made over the lateral aspect of the hip cheating posteriorly on the proximal aspect.  Careful soft  tissue dissection and coagulation of all bleeders was carried out down to the level of the glut max fascia.  The fascia was carefully incised in line with the femur.  A Charnley retractor was placed deep to the fascia with care taken to ensure that there was no nerve entrapment in the retractor.  The bursa tissue was taken down over the posterior femur exposing the external rotators.  A dull Cobra retractor was placed under the abductor mechanism to protect the mechanism and fully expose the piriformis and short external rotators.  The external rotators were carefully detached from the femur with electrocautery and tagged with Ethibond sutures.  The capsule to the hip was incised and tagged with sutures. Care was taken to preserve the labrum. The broken femoral neck and head was encountered and the femoral head was removed with a corkscrew and rongeur. The x-ray was reviewed and electrocautery was used to mark out the femoral neck resection clean up cut. The acetabulum was cleaned out of any remaining bone fragments. A 21mm trial ball was found to have good suction fit in the acetabulum.  Attention was turned back to the femur and a femoral neck retractor was placed.  The femur was opened with a box osteotome and canal finder.  The femur was then sequentially broached up to a size #7 broach which allowed for good fit and fill.  A calcar planer was then used to smooth out the calcar.  A trial neck and head were then attached and the hip was reduced with a 5mm trial ball.  The hip was found to be stable on reduction with full range of motion without subluxation or dislocation and leg lengths felt equal.  The hip was then carefully dislocated head and neck trial was removed.     The femur was then exposed the broach was removed the canal was irrigated and restrictor was placed and prepared for cement. Anesthesia was alerted to cementation and cemente was placed followed by  and a real size #7 high offset femoral  implant was impacted into place and the cement was allowed to cure.  The real bipolar ball was then placed on the femoral component.  The acetabulum was irrigated and the hip was reduced.  The hip showed good range of motion and stability on testing with both stability in flexion internal rotation and extension external rotation.  The hip was then irrigated with surgiphor and pulsatile lavage.  A 2 mm drill bit was then used to make 2 holes in the greater trochanter the piriformis and capsular tissues were reapproximated and passed through the drill holes and tied over the greater trochanter.  The fascia was then approximated with #1 Vicryl and #2 barbed suture.  The subcutaneous tissues and skin were closed with 0 Vicryl 2-0 Vicryl and 3-0 V lock suture and the skin closed with Dermabond.  A sterile dressing was then applied.  Lap, sharps, and sponge counts were correct at the end of the case.   The patient was then rolled supine and an x-ray was taken in the operating room. Leg lengths were clinically equal on examination with a good distal pulse. Components appeared in good position with no fractures noted on x-ray.  Patient was then transferred to a hospital bed and transferred to the recovery room in stable condition.

## 2022-03-24 NOTE — Assessment & Plan Note (Signed)
-  Continue Cardura

## 2022-03-24 NOTE — Transfer of Care (Signed)
Immediate Anesthesia Transfer of Care Note  Patient: Eric Hicks  Procedure(s) Performed: ARTHROPLASTY BIPOLAR HIP (HEMIARTHROPLASTY) (Right: Hip)  Patient Location: PACU  Anesthesia Type:Spinal  Level of Consciousness: awake, oriented, and sedated  Airway & Oxygen Therapy: Patient Spontanous Breathing and Patient connected to face mask oxygen  Post-op Assessment: Report given to RN and Post -op Vital signs reviewed and stable  Post vital signs: Reviewed and stable  Last Vitals:  Vitals Value Taken Time  BP 127/68 03/24/22 1830  Temp    Pulse 91 03/24/22 1835  Resp 21 03/24/22 1835  SpO2 95 % 03/24/22 1835  Vitals shown include unvalidated device data.  Last Pain:  Vitals:   03/24/22 1441  TempSrc: Tympanic  PainSc:       Patients Stated Pain Goal: 0 (04/18/81 5003)  Complications: No notable events documented.

## 2022-03-24 NOTE — Anesthesia Preprocedure Evaluation (Signed)
Anesthesia Evaluation  Patient identified by MRN, date of birth, ID band Patient awake    Reviewed: Allergy & Precautions, NPO status , Patient's Chart, lab work & pertinent test results  Airway Mallampati: II  TM Distance: >3 FB Neck ROM: full    Dental  (+) Chipped   Pulmonary COPD,  COPD inhaler, former smoker   Pulmonary exam normal        Cardiovascular hypertension, On Home Beta Blockers negative cardio ROS Normal cardiovascular exam     Neuro/Psych negative neurological ROS  negative psych ROS   GI/Hepatic negative GI ROS, Neg liver ROS,,,  Endo/Other  negative endocrine ROS    Renal/GU Renal disease     Musculoskeletal   Abdominal   Peds  Hematology negative hematology ROS (+)   Anesthesia Other Findings Past Medical History: No date: BPH (benign prostatic hyperplasia)     Comment:  managed by Olena Heckle No date: COPD (chronic obstructive pulmonary disease) ([facility]) No date: Elevated PSA, between 10 and less than 20 ng/ml     Comment:  Olena Heckle, watchful waiting No date: Hypertension No date: Osteoporosis     Comment:  by DEXA  Past Surgical History: 2017: TYMPANOSTOMY TUBE PLACEMENT  BMI    Body Mass Index: 24.39 kg/m      Reproductive/Obstetrics negative OB ROS                             Anesthesia Physical Anesthesia Plan  ASA: 2  Anesthesia Plan: Spinal   Post-op Pain Management:    Induction: Intravenous  PONV Risk Score and Plan: 1 and Propofol infusion and TIVA  Airway Management Planned: Natural Airway and Nasal Cannula  Additional Equipment:   Intra-op Plan:   Post-operative Plan: Extubation in OR  Informed Consent: I have reviewed the patients History and Physical, chart, labs and discussed the procedure including the risks, benefits and alternatives for the proposed anesthesia with the patient or authorized representative who has indicated his/her  understanding and acceptance.     Dental Advisory Given  Plan Discussed with: Anesthesiologist, CRNA and Surgeon  Anesthesia Plan Comments: (Patient consented for risks of anesthesia including but not limited to:  - adverse reactions to medications - damage to eyes, teeth, lips or other oral mucosa - nerve damage due to positioning  - sore throat or hoarseness - Damage to heart, brain, nerves, lungs, other parts of body or loss of life  Patient voiced understanding.)        Anesthesia Quick Evaluation

## 2022-03-24 NOTE — Assessment & Plan Note (Signed)
Patient has a history of nonischemic cardiomyopathy with last noted EF of 30 to 35%.  Clinically appears euvolemic. -Holding home Lasix for surgery -Continue home carvedilol and statin

## 2022-03-24 NOTE — Interval H&P Note (Signed)
History and Physical Interval Note:  03/24/2022 9:35 AM  Eric Hicks  has presented today for surgery, with the diagnosis of Right Hip Fracture.  The various methods of treatment have been discussed with the patient and family. After consideration of risks, benefits and other options for treatment, the patient has consented to  Procedure(s): ARTHROPLASTY BIPOLAR HIP (HEMIARTHROPLASTY) (Right) as a surgical intervention.  The patient's history has been reviewed, patient examined, no change in status, stable for surgery.  I have reviewed the patient's chart and labs.  Questions were answered to the patient's satisfaction.     Steffanie Rainwater

## 2022-03-24 NOTE — Anesthesia Postprocedure Evaluation (Signed)
Anesthesia Post Note  Patient: Eric Hicks  Procedure(s) Performed: ARTHROPLASTY BIPOLAR HIP (HEMIARTHROPLASTY) (Right: Hip)  Patient location during evaluation: PACU Anesthesia Type: Spinal Level of consciousness: awake and alert Pain management: pain level controlled Vital Signs Assessment: post-procedure vital signs reviewed and stable Respiratory status: spontaneous breathing, nonlabored ventilation, respiratory function stable and patient connected to nasal cannula oxygen Cardiovascular status: blood pressure returned to baseline and stable Postop Assessment: no apparent nausea or vomiting Anesthetic complications: no Comments: Pt with hypoxia. Lungs CTAB. CXR with pulmonary edema. BP stable. Lasix administered. Pt in no acute distress. He is somnolent. Incentive spirometry was started. Head of bed elevated. Duo neb administered with no improvement. Will treat pulm edema in the setting of patient's known heart failure with lasix.    No notable events documented.   Last Vitals:  Vitals:   03/24/22 2100 03/24/22 2124  BP: 121/85 121/82  Pulse: 60 81  Resp: (!) 28 (!) 22  Temp:  36.5 C  SpO2: 91% 94%    Last Pain:  Vitals:   03/24/22 2124  TempSrc: Axillary  PainSc:                  [place] Ouch

## 2022-03-25 ENCOUNTER — Inpatient Hospital Stay: Payer: Medicare Other

## 2022-03-25 ENCOUNTER — Encounter: Payer: Self-pay | Admitting: Orthopedic Surgery

## 2022-03-25 DIAGNOSIS — J9601 Acute respiratory failure with hypoxia: Secondary | ICD-10-CM | POA: Diagnosis not present

## 2022-03-25 LAB — GLUCOSE, CAPILLARY: Glucose-Capillary: 197 mg/dL — ABNORMAL HIGH (ref 70–99)

## 2022-03-25 LAB — BASIC METABOLIC PANEL
Anion gap: 6 (ref 5–15)
BUN: 41 mg/dL — ABNORMAL HIGH (ref 8–23)
CO2: 25 mmol/L (ref 22–32)
Calcium: 8.4 mg/dL — ABNORMAL LOW (ref 8.9–10.3)
Chloride: 111 mmol/L (ref 98–111)
Creatinine, Ser: 1.58 mg/dL — ABNORMAL HIGH (ref 0.61–1.24)
GFR, Estimated: 41 mL/min — ABNORMAL LOW (ref >60)
Glucose, Bld: 150 mg/dL — ABNORMAL HIGH (ref 70–99)
Potassium: 4.6 mmol/L (ref 3.5–5.1)
Sodium: 142 mmol/L (ref 135–145)

## 2022-03-25 LAB — IRON AND TIBC
Iron: 49 ug/dL (ref 45–182)
Saturation Ratios: 20 % (ref 17.9–39.5)
TIBC: 251 ug/dL (ref 250–450)
UIBC: 202 ug/dL

## 2022-03-25 LAB — RETICULOCYTES
Immature Retic Fract: 14.7 % (ref 2.3–15.9)
RBC.: 3.61 MIL/uL — ABNORMAL LOW (ref 4.22–5.81)
Retic Count, Absolute: 58.5 10*3/uL (ref 19.0–186.0)
Retic Ct Pct: 1.6 % (ref 0.4–3.1)

## 2022-03-25 LAB — CBC
HCT: 36.1 % — ABNORMAL LOW (ref 39.0–52.0)
Hemoglobin: 11.6 g/dL — ABNORMAL LOW (ref 13.0–17.0)
MCH: 32.2 pg (ref 26.0–34.0)
MCHC: 32.1 g/dL (ref 30.0–36.0)
MCV: 100.3 fL — ABNORMAL HIGH (ref 80.0–100.0)
Platelets: 107 10*3/uL — ABNORMAL LOW (ref 150–400)
RBC: 3.6 MIL/uL — ABNORMAL LOW (ref 4.22–5.81)
RDW: 14.2 % (ref 11.5–15.5)
WBC: 10.2 10*3/uL (ref 4.0–10.5)
nRBC: 0 % (ref 0.0–0.2)

## 2022-03-25 LAB — FOLATE: Folate: 6.6 ng/mL (ref >5.9)

## 2022-03-25 LAB — APTT: aPTT: 28 seconds (ref 24–36)

## 2022-03-25 LAB — D-DIMER, QUANTITATIVE: D-Dimer, Quant: 2.51 ug/mL-FEU — ABNORMAL HIGH (ref 0.00–0.50)

## 2022-03-25 LAB — TROPONIN I (HIGH SENSITIVITY)
Troponin I (High Sensitivity): 219 ng/L (ref <18)
Troponin I (High Sensitivity): 185 ng/L (ref <18)

## 2022-03-25 LAB — PROTIME-INR
INR: 1.3 — ABNORMAL HIGH (ref 0.8–1.2)
Prothrombin Time: 15.6 seconds — ABNORMAL HIGH (ref 11.4–15.2)

## 2022-03-25 LAB — FERRITIN: Ferritin: 152 ng/mL (ref 24–336)

## 2022-03-25 LAB — VITAMIN B12: Vitamin B-12: 4801 pg/mL — ABNORMAL HIGH (ref 180–914)

## 2022-03-25 LAB — BRAIN NATRIURETIC PEPTIDE: B Natriuretic Peptide: 1960.8 pg/mL — ABNORMAL HIGH (ref 0.0–100.0)

## 2022-03-25 MED ORDER — HEPARIN BOLUS VIA INFUSION
4000.0000 [IU] | Freq: Once | INTRAVENOUS | Status: AC
  Administered 2022-03-25: 4000 [IU] via INTRAVENOUS
  Filled 2022-03-25: qty 4000

## 2022-03-25 MED ORDER — FUROSEMIDE 10 MG/ML IJ SOLN: 40.0000 mg | Freq: Two times a day (BID) | INTRAMUSCULAR | Status: DC

## 2022-03-25 MED ORDER — FUROSEMIDE 10 MG/ML IJ SOLN
40.0000 mg | Freq: Once | INTRAMUSCULAR | Status: AC
  Administered 2022-03-25: 40 mg via INTRAVENOUS

## 2022-03-25 MED ORDER — HEPARIN (PORCINE) 25000 UT/250ML-% IV SOLN
1350.0000 [IU]/h | INTRAVENOUS | Status: AC
  Administered 2022-03-25: 900 [IU]/h via INTRAVENOUS
  Administered 2022-03-26: 1050 [IU]/h via INTRAVENOUS
  Administered 2022-03-27: 1200 [IU]/h via INTRAVENOUS
  Filled 2022-03-25 (×3): qty 250

## 2022-03-25 MED ORDER — ENOXAPARIN SODIUM 40 MG/0.4ML IJ SOSY: 40.0000 mg | PREFILLED_SYRINGE | INTRAMUSCULAR | Status: DC

## 2022-03-25 NOTE — Assessment & Plan Note (Addendum)
Patient developed AKI with CKD stage IIIa.  Worsening creatinine after getting IV Lasix for acute pulmonary edema and hypoxia. Renal ultrasound with cortical thickening and no hydronephrosis. Nephrology consult. Holding more Lasix -Monitor renal function -Avoid nephrotoxins

## 2022-03-25 NOTE — TOC Progression Note (Addendum)
Transition of Care [facility]) - Progression Note    Patient Details  Name: Eric Hicks MRN: [id number] Date of Birth: 07-Aug-1926  Transition of Care [facility]) CM/SW [place], RN Phone Number: 03/25/2022, 1:38 PM  Clinical Narrative:     TOC to monitor and assist with DC planning and needs, PT to eval, Needed 6 liters to recover after having Oxygen off during his meal   He lives on his own and is retired from [employer] and [employer] Interventions    Readmission Risk Interventions     No data to display

## 2022-03-25 NOTE — Assessment & Plan Note (Signed)
Blood pressure mostly within goal. -Continue carvedilol and Cardura

## 2022-03-25 NOTE — Plan of Care (Signed)
Patient asleep most of the night. Aox4 with intermittent confusion. Requested pain medication once. Oxygen at 5L via simple face mask. New skin noted on left arm from when patient removed his PIV. Surgical dressing C/D/I. Bed alarm on. Call bell within reach.   Problem: Education: Goal: Knowledge of General Education information will improve Description: Including pain rating scale, medication(s)/side effects and non-pharmacologic comfort measures Outcome: Progressing   Problem: Health Behavior/Discharge Planning: Goal: Ability to manage health-related needs will improve Outcome: Progressing   Problem: Clinical Measurements: Goal: Ability to maintain clinical measurements within normal limits will improve Outcome: Progressing Goal: Will remain free from infection Outcome: Progressing Goal: Diagnostic test results will improve Outcome: Progressing Goal: Respiratory complications will improve Outcome: Progressing Goal: Cardiovascular complication will be avoided Outcome: Progressing   Problem: Activity: Goal: Risk for activity intolerance will decrease Outcome: Progressing   Problem: Nutrition: Goal: Adequate nutrition will be maintained Outcome: Progressing   Problem: Coping: Goal: Level of anxiety will decrease Outcome: Progressing   Problem: Elimination: Goal: Will not experience complications related to bowel motility Outcome: Progressing Goal: Will not experience complications related to urinary retention Outcome: Progressing   Problem: Pain Managment: Goal: General experience of comfort will improve Outcome: Progressing   Problem: Safety: Goal: Ability to remain free from injury will improve Outcome: Progressing   Problem: Skin Integrity: Goal: Risk for impaired skin integrity will decrease Outcome: Progressing   Problem: Education: Goal: Verbalization of understanding the information provided (i.e., activity precautions, restrictions, etc) will  improve Outcome: Progressing Goal: Individualized Educational Video(s) Outcome: Progressing   Problem: Activity: Goal: Ability to ambulate and perform ADLs will improve Outcome: Progressing   Problem: Clinical Measurements: Goal: Postoperative complications will be avoided or minimized Outcome: Progressing   Problem: Self-Concept: Goal: Ability to maintain and perform role responsibilities to the fullest extent possible will improve Outcome: Progressing   Problem: Pain Management: Goal: Pain level will decrease Outcome: Progressing

## 2022-03-25 NOTE — Progress Notes (Signed)
   Subjective: 1 Day Post-Op Procedure(s) (LRB): ARTHROPLASTY BIPOLAR HIP (HEMIARTHROPLASTY) (Right) Patient reports pain as mild.   Patient is well, but has had some minor complaints of fatigue and nausea/vomiting. Patient states he feels sleepy. On 5 liters O2 via mask Denies any CP, SOB, ABD pain. We will start therapy today.  Plan is to go Skilled nursing facility after hospital stay.  Objective: Vital signs in last 24 hours: Temp:  [96.4 F (35.8 C)-99.3 F (37.4 C)] 97.1 F (36.2 C) (11/30 0309) Pulse Rate:  [39-93] 87 (11/30 0309) Resp:  [17-28] 20 (11/30 0309) BP: (99-129)/(61-85) 117/61 (11/30 0309) SpO2:  [87 %-95 %] 94 % (11/30 0309)  Intake/Output from previous day: 11/29 0701 - 11/30 0700 In: 1400 [I.V.:1000; IV Piggyback:400] Out: 100 [Blood:100] Intake/Output this shift: No intake/output data recorded.  Recent Labs    03/23/22 2058 03/24/22 0323  HGB 13.1 12.5*   Recent Labs    03/23/22 2058 03/24/22 0323  WBC 6.6 14.9*  RBC 3.97* 3.86*  HCT 39.5 38.2*  PLT 144* 147*   Recent Labs    03/23/22 2058 03/24/22 0323  NA 142 139  K 4.4 4.3  CL 111 112*  CO2 26 23  BUN 28* 31*  CREATININE 1.31* 1.40*  GLUCOSE 122* 141*  CALCIUM 9.1 9.0   No results for input(s): "LABPT", "INR" in the last 72 hours.  EXAM General - Patient is Alert, Appropriate, and Oriented. No distress. Extremity - Neurovascular intact Sensation intact distally Intact pulses distally Dorsiflexion/Plantar flexion intact Dressing - dressing C/D/I and no drainage Motor Function - intact, moving foot and toes well on exam.   Past Medical History:  Diagnosis Date   BPH (benign prostatic hyperplasia)    managed by Olena Heckle   COPD (chronic obstructive pulmonary disease) (HCC)    Elevated PSA, between 10 and less than 20 ng/ml    Olena Heckle, watchful waiting   Hypertension    Osteoporosis    by DEXA    Assessment/Plan:   1 Day Post-Op Procedure(s) (LRB): ARTHROPLASTY  BIPOLAR HIP (HEMIARTHROPLASTY) (Right) Principal Problem:   Closed right hip fracture (HCC) Active Problems:   BPH (benign prostatic hyperplasia)   White coat syndrome with diagnosis of hypertension   Nonischemic dilated cardiomyopathy (HCC)   Paroxysmal atrial fibrillation (HCC)   Stage 3a chronic kidney disease (CKD) (HCC)   Hyperlipidemia  Estimated body mass index is 24.39 kg/m as calculated from the following:   Height as of this encounter: 5\' 10"  (1.778 m).   Weight as of this encounter: 77.1 kg. Advance diet Up with therapy Work on [employer] and VSS Recheck labs in the am CM to assist with discharge to SNF  Patient will need 2 week follow up with [facility] Lovenox 40 mg SQ daily x 14 days at discharge   DVT Prophylaxis - Lovenox, TED hose, and SCDs Weight-Bearing as tolerated to right leg   T. Rachelle Hora, PA-C [place] 03/25/2022, 7:10 AM

## 2022-03-25 NOTE — Evaluation (Signed)
Physical Therapy Evaluation Patient Details Name: Eric Hicks MRN: [id number] DOB: 25-Jan-1927 Today's Date: 03/25/2022  History of Present Illness  presented to ER secondary to mechanical fall with acute onset of R hip/elbow pain; admitted for management of closed R hip fracture, s/p R hip hemiarthroplasty (03/24/22)  Clinical Impression  Patient resting in bed upon arrival to room; resting with eyes closed, but easily awakens to voice/light touch.  Children (2 sons, daughter) present at bedside.  Patient alert and oriented to basic information; follows commands and agreeable to session as able.  Rates R hip pain 4-6/10 with movement; improves with rest and repositioning. R LE strength (3-/5) and ROM generally guarded, limited by pain; requiring act assist from therapist for movement of R LE throughout all planes. Currently requiring max assist +2 for supine/sit; mod progressing to close sup for periods of unsupported sitting balance.  Requires increased time/encouragement for orientation to midline; does maintain L lateral lean/weight shift due to R hip pain with WBing.  Tolerates sitting position x2-3 min prior to return to supine for rest and recovery.  Additional activity and OOB efforts deferred at this time. Notable SOB with minimal exertion; desat to 83-84% with transition to unsupported sitting, recovers to 89-90% with return to supine and cuing for pursed lip breathing. Would benefit from skilled PT to address above deficits and promote optimal return to PLOF.; recommend transition to STR upon discharge from acute hospitalization.      Recommendations for follow up therapy are one component of a multi-disciplinary discharge planning process, led by the attending physician.  Recommendations may be updated based on patient status, additional functional criteria and insurance authorization.  Follow Up Recommendations Skilled nursing-short term rehab (<3 hours/day) Can patient physically be  transported by private vehicle: No    Assistance Recommended at Discharge Frequent or constant Supervision/Assistance  Patient can return home with the following  Two people to help with walking and/or transfers;Two people to help with bathing/dressing/bathroom    Equipment Recommendations    Recommendations for Other Services       Functional Status Assessment Patient has had a recent decline in their functional status and demonstrates the ability to make significant improvements in function in a reasonable and predictable amount of time.     Precautions / Restrictions Precautions Precautions: Fall Restrictions Weight Bearing Restrictions: Yes RLE Weight Bearing: Weight bearing as tolerated      Mobility  Bed Mobility Overal bed mobility: Needs Assistance Bed Mobility: Supine to Sit     Supine to sit: Max assist, +2 for physical assistance     General bed mobility comments: extensive assist for truncal elevation and LE management.  Mod assist for initial sitting balance; cga/close sup for periods of static sitting once oriented to position. Does maintain L lateral trunk lean/weight shift due to R hip pain    Transfers                   General transfer comment: unsafe/unable    Ambulation/Gait               General Gait Details: unsafe/unable  Stairs            Wheelchair Mobility    Modified Rankin (Stroke Patients Only)       Balance Overall balance assessment: Needs assistance Sitting-balance support: No upper extremity supported, Feet supported Sitting balance-Leahy Scale: Fair   Postural control: Left lateral lean  Pertinent Vitals/Pain Pain Assessment Pain Assessment: Faces Faces Pain Scale: Hurts even more Pain Location: R hip Pain Descriptors / Indicators: Aching, Grimacing, Guarding Pain Intervention(s): Limited activity within patient's tolerance, Monitored during session,  Repositioned    Home Living Family/patient expects to be discharged to:: Private residence Living Arrangements: Alone Available Help at Discharge: Family Type of Home: House Home Access: Stairs to enter Entrance Stairs-Rails: Right;Left;Can reach both Entrance Stairs-Number of Steps: 4   Home Layout: Two level;Able to live on main level with bedroom/bathroom        Prior Function Prior Level of Function : Independent/Modified Independent             Mobility Comments: Indep with ADLs, household and some community mobilization without assist device; + driving; denies fall history outside of this admission       Hand Dominance        Extremity/Trunk Assessment   Upper Extremity Assessment Upper Extremity Assessment: Generalized weakness    Lower Extremity Assessment Lower Extremity Assessment: Generalized weakness (R hip/knee grossly 3-/5, limited by pain; requiring act assist throughout available/tolerable ROM)       Communication   Communication: HOH  Cognition Arousal/Alertness: Awake/alert Behavior During Therapy: WFL for tasks assessed/performed Overall Cognitive Status: Within Functional Limits for tasks assessed                                          General Comments      Exercises Other Exercises Other Exercises: Supine R LE therex, 1x10, act assist ROM for muscular strength/endurance Other Exercises: Did review THPs with patient/daughter; will continue to reinforce throughout stay   Assessment/Plan    PT Assessment Patient needs continued PT services  PT Problem List Decreased strength;Decreased range of motion;Decreased balance;Decreased activity tolerance;Decreased knowledge of precautions;Decreased mobility;Decreased coordination;Decreased cognition;Decreased knowledge of use of DME;Decreased safety awareness;Cardiopulmonary status limiting activity;Pain       PT Treatment Interventions DME instruction;Gait  training;Functional mobility training;Stair training;Therapeutic activities;Therapeutic exercise;Balance training;Neuromuscular re-education    PT Goals (Current goals can be found in the Care Plan section)  Acute Rehab PT Goals Patient Stated Goal: to go back home when i'm ready PT Goal Formulation: With patient/family Time For Goal Achievement: 04/08/22 Potential to Achieve Goals: Fair    Frequency 7X/week     Co-evaluation               AM-PAC PT "6 Clicks" Mobility  Outcome Measure Help needed turning from your back to your side while in a flat bed without using bedrails?: A Lot Help needed moving from lying on your back to sitting on the side of a flat bed without using bedrails?: A Lot Help needed moving to and from a bed to a chair (including a wheelchair)?: Total Help needed standing up from a chair using your arms (e.g., wheelchair or bedside chair)?: Total Help needed to walk in hospital room?: Total Help needed climbing 3-5 steps with a railing? : Total 6 Click Score: 8    End of Session   Activity Tolerance: Patient limited by pain (Limited by SOB) Patient left: in bed;with call bell/phone within reach;with bed alarm set;with family/visitor present Nurse Communication: Mobility status PT Visit Diagnosis: Other abnormalities of gait and mobility (R26.89);Difficulty in walking, not elsewhere classified (R26.2);Pain Pain - Right/Left: Right Pain - part of body: Hip    Time: 4315-4008 PT Time Calculation (min) (ACUTE  ONLY): 29 min   Charges:   PT Evaluation $PT Eval Moderate Complexity: 1 Mod PT Treatments $Therapeutic Exercise: 8-22 mins        Aaryn Parrilla H. Owens Shark, PT, DPT, NCS 03/25/22, 5:29 PM [phone]

## 2022-03-25 NOTE — Assessment & Plan Note (Addendum)
Repeat echocardiogram with improvement of EF to 50 to 55% with grade 1 diastolic dysfunction.  No regional wall motion abnormalities or right ventricular strain noted.  Troponin elevated most likely secondary to demand with acute pulmonary embolism and respiratory distress.  BNP at 1963. Patient did receive IV Lasix due to pulmonary vascular congestion and respiratory distress resulted in worsening of AKI. -Cardiology consult -Continue home carvedilol and statin -Holding further Lasix-will let nephrology and cardiology decide

## 2022-03-25 NOTE — Progress Notes (Signed)
Rapid Response Event Note   Reason for Call : Respiratory distress   Initial Focused Assessment: On my arrival pt is laying in bed, noticeably short of breath. Audible crackles heard with breathing. Pt is initially on 6L [place] satting 89%. Primary RN states pt has had increased oxygen need this afternoon. Pt is alert and oriented. BP 121/80 with a MAP of 91, HR 60. Pt has history of CHF and takes a diuretic at home per the family. Pt's last dose of lasix was 8pm last night according to primary RN. 476ml of urine output measured today.   Interventions: RT at bedside placing pt on venti mask, O2 sats 90%. RT then placed pt on nonrebreather and pt's O2 sat 95%. 40 mg of lasix ordered and given. Chest xray also ordered. Dr. Reesa Chew paged and is on her way to see patient. Pt states that his breathing is starting to feel better. RT will leave pt on nonrebreather for now and will reassess.    Plan of Care: Pt remains on 1A for now. Rapid response RN will follow up on patient. No further needs from rapid response at this time.    Event Summary:   MD Notified: Dr. Reesa Chew Call Time: 1522 Arrival Time: 6438 End Time: [place], RN

## 2022-03-25 NOTE — Assessment & Plan Note (Signed)
Secondary to mechanical fall, s/p hemiarthroplasty on 10/29. -PT/OT evaluation -Continue with pain management-try avoiding opioids.

## 2022-03-25 NOTE — Progress Notes (Signed)
Patient developed sudden worsening of breathing status requiring higher level of oxygen, initially was placed on nonrebreather followed by heated high flow. Rapid response was also called. On exam he was tachypneic with bilateral crackles No peripheral edema.  Positive JVD Stat chest x-ray with stable congestive heart failure findings. BNP elevated above 1900 Troponin at 219>>185  Patient was given IV Lasix and started on twice daily Lasix. Starting him on heparin infusion. Cardiology was also consulted. Patient will be transferred to progressive care.

## 2022-03-25 NOTE — Progress Notes (Signed)
       CROSS COVER NOTE  NAME: Eric Hicks MRN: [id number] DOB : [birth year]    Time of Service   1930 PM  HPI/Events of Note   Per Dr Reesa Chew patient went into sudden respiratory distress and is in acute on chronic heart failure  Assessment and  Interventions   Assessment:  Plan: X X X

## 2022-03-25 NOTE — Assessment & Plan Note (Addendum)
Seems improving, he was able to weaned down to 4 L.  VQ scan with acute pulmonary embolism.  Elevated BNP and echocardiogram with improvement of his EF, no regional wall motion abnormalities and grade 1 diastolic dysfunction.  Initially treated with heparin -Continue with Eliquis

## 2022-03-25 NOTE — Progress Notes (Signed)
Family member came out stating "pt is gurgling".  When nurse entered room pt was short of breath and crackles was audible. Pt was 86% on 4 liters. O2 was changed to 6 liters. He remained 86% . Pt was diaphoretic. Dr Reesa Chew was notified. Lasix 40 mg was ordered and administered. Rapid response was called. Respiratory Therapist place pt on nonrebreather mask. Dr Reesa Chew and Rapid Reponse is in room. Chest Xray ordered

## 2022-03-25 NOTE — Progress Notes (Addendum)
  Date and time results received: 03/25/22 1730 (use smartphrase ".now" to insert current time)  Test: troponin Critical Value: 219  Name of Provider Notified: Reesa Chew  Orders Received? Or Actions Taken?: received new order for EKG MD said she will notify cardiology

## 2022-03-25 NOTE — Plan of Care (Signed)

## 2022-03-25 NOTE — Progress Notes (Signed)
Report given to RN on 2A. Patient transported on non rebreather and in no distress. Family aware of transfer to room 241. Patients belongings gathered and taken with patient. Tele box 40-07 removed and returned.

## 2022-03-25 NOTE — Progress Notes (Signed)
ANTICOAGULATION CONSULT NOTE - Initial Consult  Pharmacy Consult for heparin Indication: ACS/STEMI  Allergies  Allergen Reactions   Tylenol [Acetaminophen] Rash    Patient Measurements: Height: 5\' 10"  (177.8 cm) Weight: 77.1 kg (170 lb) IBW/kg (Calculated) : 73 Heparin Dosing Weight: 77.1 kg  Vital Signs: Temp: 98.2 F (36.8 C) (11/30 1501) BP: 119/76 (11/30 1501) Pulse Rate: 82 (11/30 1501)  Labs: Recent Labs    03/23/22 2058 03/24/22 0323 03/25/22 0634 03/25/22 1611  HGB 13.1 12.5* 11.6*  --   HCT 39.5 38.2* 36.1*  --   PLT 144* 147* 107*  --   CREATININE 1.31* 1.40* 1.58*  --   TROPONINIHS  --   --   --  219*    Estimated Creatinine Clearance: 30.2 mL/min (A) (by C-G formula based on SCr of 1.58 mg/dL (H)).   Medical History: Past Medical History:  Diagnosis Date   BPH (benign prostatic hyperplasia)    managed by Olena Heckle   COPD (chronic obstructive pulmonary disease) (HCC)    Elevated PSA, between 10 and less than 20 ng/ml    Olena Heckle, watchful waiting   Hypertension    Osteoporosis    by DEXA    Medications:  No PTA AC meds Last dose enxaparin ppx 11/30 0825   Assessment: Pharmacy consulted to dose heparin in 88 yo M admitted on 11/28 for mechanical fall.  11/30 pt was noted to be in respiratory distress with elevated troponin I.  Baseline Labs:  Hgb 11.6, Hct 36.1, Plts 107 aPTT and INR ordered   Goal of Therapy:  Heparin level 0.3-0.7 units/ml Monitor platelets by anticoagulation protocol: Yes   Plan:  Give 4000 units bolus x 1 Start heparin infusion at 900 units/hr Check anti-Xa level in 8 hours and daily while on heparin Continue to monitor H&H and platelets  Alison Murray 03/25/2022,6:18 PM

## 2022-03-25 NOTE — Progress Notes (Signed)
Progress Note   Patient: Eric Hicks:[id number] DOB: 10-Dec-1926 DOA: 03/23/2022     2 DOS: the patient was seen and examined on 03/25/2022   Brief hospital course: Eric Hicks is a 88 year old male with history of hyperlipidemia, hypertension, impaired fasting glucose, history of whitecoat syndrome, who presents emergency department for chief concerns of mechanical fall and falling on his right hip and elbow, resulted and mildly impacted right femoral neck fracture  Patient denies loss of consciousness and hitting his head.  Initial vitals in the emergency department showed temperature of 97.6, respiration rate 18, heart rate 78, blood pressure 158/106, SpO2 of 96% on room air.  Labs in the ED showed serum sodium 142, potassium 4.4, bicarb 26, BUN 28, serum creatinine 1.31, GFR 51, nonfasting glucose 122. WBC 6.6, hemoglobin 13.1, platelets 144. CT head, CT cervical spine, CT chest and EKG elbow was without any acute abnormality. DG hip with mildly impacted right femoral neck fracture  ED treatment: Tdap booster has been ordered for injection.  Oxycodone 5 mg p.o. one-time dose.  11/29: Hemodynamically stable on 3 L oxygen, labs pertinent for leukocytosis, most likely reactive, hemoglobin decreased to 12.5 from 13.1, BMP with slight worsening of BUN 31 and creatinine 1.40 which remained around his baseline of 1.3-1.4. Going to the OR with orthopedic surgery today.  11/30: Patient underwent hemiarthroplasty with orthopedic surgery on 11/29.  Required transfer [place] acid twice for bleeding.  Also received 1 dose of IV Lasix in PACU. Blood pressure mildly elevated at 142/82 this morning.  Renal function with slight deterioration to 41/1.58, GFR decreased to 41 from 47. Anemia panel with normal iron and low normal folate, B12 pending. Patient also with new oxygen requirement-history of COPD-no wheezing. Encouraging incentive spirometry and flutter valve. Chest x-ray and  BNP  Assessment and Plan: * Closed right hip fracture (HCC) Secondary to mechanical fall, s/p hemiarthroplasty on 10/29. -PT/OT evaluation -Continue with pain management-try avoiding opioids.  Acute respiratory failure with hypoxia [facility]) Patient requiring 4 to 6 L of oxygen with no prior oxygen use.  History of COPD and congestive heart failure.  Decreased breath sound at bases.  Did receive IV Lasix in PACU, resulted in worsening of renal function. -Repeat chest x-ray -Continue supplemental oxygen-wean as tolerated -Incentive spirometry and flutter valve -Check BNP   Paroxysmal atrial fibrillation (HCC) Chronic atrial fibrillation and sees [facility] clinic cardiology - Per note on 02/11/2022, patient has declined anticoagulation therefore he is not on anticoagulation - Patient is on carvedilol 6.25 mg p.o. twice daily, this will be resumed  White coat syndrome with diagnosis of hypertension Blood pressure mostly within goal. -Continue carvedilol and Cardura  Nonischemic dilated cardiomyopathy ([place]) patient has a history of nonischemic cardiomyopathy with last noted EF of 30 to 35%.  Clinically appears euvolemic.  Patient received IV Lasix in PACU with little worsening of renal function. -Repeat chest x-ray -Check BNP -Holding home Lasix for surgery -Continue home carvedilol and statin  BPH (benign prostatic hyperplasia) -Continue Cardura  Stage 3a chronic kidney disease (CKD) ([place]) Patient developed AKI with CKD stage IIIa.  Received IV Lasix in PACU due to hypoxia with history of heart failure. -Monitor renal function -Avoid nephrotoxins  Hyperlipidemia - Atorvastatin 20 mg daily resumed   Subjective: Patient was seen and examined today.  Denies any pain.  Multiple family members at bedside.  Physical Exam: Vitals:   03/24/22 2224 03/25/22 0309 03/25/22 0727 03/25/22 1501  BP: 118/84 117/61 (!) 142/82 119/76  Pulse: 81 87 69 82  Resp: (!) 22 20 16 15   Temp: 98.6  F (37 C) (!) 97.1 F (36.2 C) 98.2 F (36.8 C) 98.2 F (36.8 C)  TempSrc: Axillary Axillary    SpO2: 90% 94% 95% 91%  Weight:      Height:       General.  Frail elderly man, in no acute distress. Pulmonary.  Lungs clear bilaterally, normal respiratory effort, decreased breath sound at bases. CV.  Regular rate and rhythm, no JVD, rub or murmur. Abdomen.  Soft, nontender, nondistended, BS positive. CNS.  Alert and oriented .  No focal neurologic deficit. Extremities.  No edema, no cyanosis, pulses intact and symmetrical. Psychiatry.  Judgment and insight appears normal.   Data Reviewed: Prior data reviewed  Family Communication: Discussed with son and other family members at bedside  Disposition: Status is: Inpatient Remains inpatient appropriate because: Severity of illness  Planned Discharge Destination:  To be determined  Time spent: 50 minutes  This record has been created using Systems analyst. Errors have been sought and corrected,but may not always be located. Such creation errors do not reflect on the standard of care.   Author: Lorella Nimrod, MD 03/25/2022 3:19 PM  For on call review [email or web address].

## 2022-03-25 NOTE — Progress Notes (Signed)
PT Cancellation Note  Patient Details Name: MATVEY LLANAS MRN: [id number] DOB: [birth year]   Cancelled Treatment:    Reason Eval/Treat Not Completed: Medical issues which prohibited therapy (Consult received and chart reviewed.  Patient eating breakfast with son at bedside upon arrival.  Lips noted to be mildly cyanotic, O2 back mask/nasal cannula noted at bedside.  Per son, O2 removed to allow patient to eat.  Sats 68-70% on RA.  O2 via [place] reapplied, requiring 6L for recovery to 85-86%.  Encouraged to leave [place] on at all times for respiratory support.  RN informed/aware.  Will re-attempt therapy efforts once patient done with meal and available for mobility assessment.)  Caren Garske H. Owens Shark, PT, DPT, NCS 03/25/22, 10:10 AM [phone]

## 2022-03-26 ENCOUNTER — Inpatient Hospital Stay: Payer: Medicare Other

## 2022-03-26 ENCOUNTER — Inpatient Hospital Stay
Admit: 2022-03-26 | Discharge: 2022-03-26 | Disposition: A | Discharge status: Home / Self Care | Payer: Medicare Other | Attending: Internal Medicine | Admitting: Internal Medicine

## 2022-03-26 ENCOUNTER — Inpatient Hospital Stay: Admit: 2022-03-26 | Payer: Medicare Other

## 2022-03-26 DIAGNOSIS — J9601 Acute respiratory failure with hypoxia: Secondary | ICD-10-CM | POA: Diagnosis not present

## 2022-03-26 DIAGNOSIS — Z515 Encounter for palliative care: Secondary | ICD-10-CM

## 2022-03-26 DIAGNOSIS — Z86711 Personal history of pulmonary embolism: Secondary | ICD-10-CM | POA: Diagnosis not present

## 2022-03-26 DIAGNOSIS — I2699 Other pulmonary embolism without acute cor pulmonale: Secondary | ICD-10-CM | POA: Diagnosis not present

## 2022-03-26 LAB — CBC
HCT: 34.6 % — ABNORMAL LOW (ref 39.0–52.0)
Hemoglobin: 11.3 g/dL — ABNORMAL LOW (ref 13.0–17.0)
MCH: 32.7 pg (ref 26.0–34.0)
MCHC: 32.7 g/dL (ref 30.0–36.0)
MCV: 100 fL (ref 80.0–100.0)
Platelets: 114 10*3/uL — ABNORMAL LOW (ref 150–400)
RBC: 3.46 MIL/uL — ABNORMAL LOW (ref 4.22–5.81)
RDW: 14.1 % (ref 11.5–15.5)
WBC: 14.5 10*3/uL — ABNORMAL HIGH (ref 4.0–10.5)
nRBC: 0 % (ref 0.0–0.2)

## 2022-03-26 LAB — ECHOCARDIOGRAM COMPLETE
AR max vel: 2.98 cm2
AV Area VTI: 3.22 cm2
AV Area mean vel: 2.85 cm2
AV Mean grad: 3 mmHg
AV Peak grad: 5.5 mmHg
Ao pk vel: 1.17 m/s
Area-P 1/2: 4.99 cm2
Height: 70 in
P 1/2 time: 396 msec
Weight: 2720 oz

## 2022-03-26 LAB — BASIC METABOLIC PANEL
Anion gap: 7 (ref 5–15)
BUN: 50 mg/dL — ABNORMAL HIGH (ref 8–23)
CO2: 26 mmol/L (ref 22–32)
Calcium: 8.7 mg/dL — ABNORMAL LOW (ref 8.9–10.3)
Chloride: 110 mmol/L (ref 98–111)
Creatinine, Ser: 1.9 mg/dL — ABNORMAL HIGH (ref 0.61–1.24)
GFR, Estimated: 32 mL/min — ABNORMAL LOW (ref >60)
Glucose, Bld: 140 mg/dL — ABNORMAL HIGH (ref 70–99)
Potassium: 4.4 mmol/L (ref 3.5–5.1)
Sodium: 143 mmol/L (ref 135–145)

## 2022-03-26 LAB — SURGICAL PATHOLOGY

## 2022-03-26 LAB — TROPONIN I (HIGH SENSITIVITY)
Troponin I (High Sensitivity): 141 ng/L (ref <18)
Troponin I (High Sensitivity): 139 ng/L (ref <18)

## 2022-03-26 LAB — HEPARIN LEVEL (UNFRACTIONATED)
Heparin Unfractionated: 0.36 IU/mL (ref 0.30–0.70)
Heparin Unfractionated: 0.22 IU/mL — ABNORMAL LOW (ref 0.30–0.70)
Heparin Unfractionated: 0.31 IU/mL (ref 0.30–0.70)

## 2022-03-26 MED ORDER — TECHNETIUM TO 99M ALBUMIN AGGREGATED
4.0000 | Freq: Once | INTRAVENOUS | Status: AC | PRN
  Administered 2022-03-26: 4.48 via INTRAVENOUS

## 2022-03-26 MED ORDER — HEPARIN BOLUS VIA INFUSION
1000.0000 [IU] | Freq: Once | INTRAVENOUS | Status: AC
  Administered 2022-03-26: 1000 [IU] via INTRAVENOUS
  Filled 2022-03-26: qty 1000

## 2022-03-26 NOTE — Assessment & Plan Note (Signed)
Chronic atrial fibrillation and sees [facility] clinic cardiology - Per note on 02/11/2022, patient has declined anticoagulation therefore he is not on anticoagulation - Patient is on carvedilol 6.25 mg p.o. twice daily, this was resumed

## 2022-03-26 NOTE — Assessment & Plan Note (Signed)
VQ scan positive for acute pulmonary embolism.  Unable to do CTA due to AKI. Worsening respiratory status. Sent a message to vascular surgery to explore other possibilities. -Continue with heparin infusion

## 2022-03-26 NOTE — Consult Note (Signed)
[facility]  CONSULT NOTE    Date: 03/26/2022                  Patient Name:  Eric Hicks  MRN: [id number]  DOB: [birth year]  Age / Sex: 88 y.o., male         PCP: Crecencio Mc, MD                 Service Requesting Consult: [facility]                 Reason for Consult: Acute kidney injury            History of Present Illness: Eric Hicks is a 88 y.o.  male with past medical history of hypertension, hyperlipidemia, and white coat syndrome, who was admitted to [facility] on 03/23/2022 for Closed right hip fracture ([place]) [S72.001A] Closed fracture of right hip, initial encounter [facility]) [S72.001A]  Patient presents to ED after suffering a fall resulting in a fractured left hip. Patient seen laying in bed, daughter at bedside. Patient underwent a right hip hemiarthroplasty on 03/24/22. Became short of breath overnight requiring 45L oxygen. Patient also received IV furosemide with little urine output. Patient states his breathing has improved and during this visit, patient is weaned down to 6L [place]. Continues to have occasional cough. No lower extremity edema.  Labs on arrival shows glucose 122, BUN 28, creatinine 1.31, Albumin 3.4, Hgb 13.1. Right femur xray shows right femoral neck fracture.    Medications: Outpatient medications: Medications Prior to Admission  Medication Sig Dispense Refill Last Dose   ADVAIR DISKUS 100-50 MCG/ACT AEPB INHALE 1 PUFF INTO THE LUNGS TWICE A DAY 180 each 1 03/23/2022   Aflibercept (EYLEA IO) Inject into the eye.   Past Month   AMBULATORY NON FORMULARY MEDICATION Joint Advantage Gold 2 tablets daily   03/23/2022   atorvastatin (LIPITOR) 20 MG tablet TAKE 1 TABLET BY MOUTH EVERY OTHER DAY 45 tablet 3 03/22/2022   calcium carbonate (OS-CAL) 600 MG TABS Take 600 mg by mouth daily with breakfast.    03/23/2022   carvedilol (COREG) 6.25 MG tablet Take 6.25 mg by mouth 2 (two) times daily.   03/23/2022   dorzolamide-timolol (COSOPT)  22.3-6.8 MG/ML ophthalmic solution TAKE 1 DROP(S) IN RIGHT EYE 2 TIMES A DAY  5 03/23/2022   doxazosin (CARDURA) 1 MG tablet TAKE 1 TABLET BY MOUTH EVERY DAY 90 tablet 1 03/23/2022   furosemide (LASIX) 20 MG tablet TAKE 2 TABLETS BY MOUTH DAILY AS NEEDED 180 tablet 1 03/23/2022   latanoprost (XALATAN) 0.005 % ophthalmic solution Place 1 drop into both eyes at bedtime.  5 Past Month   Vitamin D, Cholecalciferol, 25 MCG (1000 UT) CAPS Take 1 capsule by mouth daily.   03/23/2022   leuprolide, 6 Month, (ELIGARD) 45 MG injection Inject 45 mg into the skin every 6 (six) months.       Current medications: Current Facility-Administered Medications  Medication Dose Route Frequency Provider Last Rate Last Admin   atorvastatin (LIPITOR) tablet 20 mg  20 mg Oral QODAY Beverlin, Amy N, DO   20 mg at 03/26/22 9741   calcium carbonate (OS-CAL - dosed in mg of elemental calcium) tablet 1,250 mg  1,250 mg Oral Q breakfast Blanks, Amy N, DO   1,250 mg at 03/26/22 0833   carvedilol (COREG) tablet 6.25 mg  6.25 mg Oral BID Montavon, Amy N, DO   6.25 mg at 03/26/22 [phone]  cholecalciferol (VITAMIN D3) tablet 1,000 Units  1,000 Units Oral Daily Reisch, Amy N, DO   1,000 Units at 03/26/22 0865   docusate sodium (COLACE) capsule 100 mg  100 mg Oral BID Steffanie Rainwater, MD   100 mg at 03/26/22 0833   dorzolamide-timolol (COSOPT) 2-0.5 % ophthalmic solution 1 drop  1 drop Both Eyes BID Lorella Nimrod, MD   1 drop at 03/26/22 0835   doxazosin (CARDURA) tablet 1 mg  1 mg Oral Daily Lorella Nimrod, MD   1 mg at 03/26/22 0834   fluticasone furoate-vilanterol (BREO ELLIPTA) 100-25 MCG/ACT 1 puff  1 puff Inhalation Daily Lorella Nimrod, MD   1 puff at 03/26/22 1129   heparin ADULT infusion 100 units/mL (25000 units/243m)  1,050 Units/hr Intravenous Continuous Coulter, Carolyn, RPH 10.5 mL/hr at 03/26/22 1316 1,050 Units/hr at 03/26/22 1316   latanoprost (XALATAN) 0.005 % ophthalmic solution 1 drop  1 drop Both Eyes QHS ALorella Nimrod MD   1 drop  at 03/25/22 2311   menthol-cetylpyridinium (CEPACOL) lozenge 3 mg  1 lozenge Oral PRN ASteffanie Rainwater MD       Or   phenol (CHLORASEPTIC) mouth spray 1 spray  1 spray Mouth/Throat PRN ASteffanie Rainwater MD       metoCLOPramide (REGLAN) tablet 5-10 mg  5-10 mg Oral Q8H PRN ASteffanie Rainwater MD       Or   metoCLOPramide (REGLAN) injection 5-10 mg  5-10 mg Intravenous Q8H PRN ASteffanie Rainwater MD   10 mg at 03/25/22 07846  morphine (PF) 2 MG/ML injection 0.5-1 mg  0.5-1 mg Intravenous Q2H PRN ASteffanie Rainwater MD       mupirocin ointment (BACTROBAN) 2 % 1 Application  1 Application Nasal BID [place], Amy N, DO   1 Application at [id number]   ondansetron (ZOFRAN) tablet 4 mg  4 mg Oral Q6H PRN ASteffanie Rainwater MD       Or   ondansetron (ZOFRAN) injection 4 mg  4 mg Intravenous Q6H PRN ASteffanie Rainwater MD       oxyCODONE (Oxy IR/ROXICODONE) immediate release tablet 5 mg  5 mg Oral Q6H PRN Spinello, Amy N, DO   5 mg at 03/24/22 2312   senna-docusate (Senokot-S) tablet 1 tablet  1 tablet Oral QHS PRN Hehr, Amy N, DO       traMADol (ULTRAM) tablet 50 mg  50 mg Oral Q6H PRN ASteffanie Rainwater MD   50 mg at 03/25/22 2310      Allergies: Allergies  Allergen Reactions   Tylenol [Acetaminophen] Rash      Past Medical History: Past Medical History:  Diagnosis Date   BPH (benign prostatic hyperplasia)    managed by DOlena Heckle  [place]    Elevated PSA, between 10 and less than 20 ng/ml    DOlena Heckle watchful waiting   Hypertension    Osteoporosis    by DEXA     Past Surgical History: Past Surgical History:  Procedure Laterality Date   HIP ARTHROPLASTY Right 03/24/2022   Procedure: ARTHROPLASTY BIPOLAR HIP (HEMIARTHROPLASTY);  Surgeon: ASteffanie Rainwater MD;  Location: [facility] ORS;  Service: Orthopedics;  Laterality: Right;   TYMPANOSTOMY TUBE PLACEMENT  2017     Family History: Family History  Problem Relation Age of Onset   Diabetes Mother     Hypertension Father    Cancer Brother 831      multiple myeloma     Social History: Social History   Socioeconomic History  Marital status: Widowed    Spouse name: Not on file   Number of children: Not on file   Years of education: Not on file   Highest education level: Not on file  Occupational History   Not on file  Tobacco Use   Smoking status: Former   Smokeless tobacco: Never  Vaping Use   Vaping Use: Never used  Substance and Sexual Activity   Alcohol use: No   Drug use: Not on file   Sexual activity: Never  Other Topics Concern   Not on file  Social History Narrative   Regular exercise-yew   Social Determinants of Health   Financial Resource Strain: Low Risk  (10/07/2021)   Overall Financial Resource Strain (CARDIA)    Difficulty of Paying Living Expenses: Not hard at all  Food Insecurity: No Food Insecurity (03/24/2022)   Hunger Vital Sign    Worried About Running Out of Food in the Last Year: Never true    [place] in the Last Year: Never true  Transportation Needs: No Transportation Needs (03/24/2022)   PRAPARE - Hydrologist (Medical): No    Lack of Transportation (Non-Medical): No  Physical Activity: Not on file  Stress: No Stress Concern Present (10/07/2021)   [place]    Feeling of Stress : Not at all  Social Connections: Unknown (10/07/2021)   Social Connection and Isolation Panel [NHANES]    Frequency of Communication with Friends and Family: More than three times a week    Frequency of Social Gatherings with Friends and Family: Not on file    Attends Religious Services: Not on file    Active Member of Clubs or Organizations: Not on file    Attends Archivist Meetings: Not on file    Marital Status: Widowed  Intimate Partner Violence: Not At Risk (03/24/2022)   Humiliation, Afraid, Rape, and Kick questionnaire    Fear of Current or  Ex-Partner: No    Emotionally Abused: No    Physically Abused: No    Sexually Abused: No     Review of Systems: Review of Systems  Constitutional:  Negative for chills, fever and malaise/fatigue.  HENT:  Negative for congestion, sore throat and tinnitus.   Eyes:  Negative for blurred vision and redness.  Respiratory:  Negative for cough, shortness of breath and wheezing.   Cardiovascular:  Negative for chest pain, palpitations, claudication and leg swelling.  Gastrointestinal:  Negative for abdominal pain, blood in stool, diarrhea, nausea and vomiting.  Genitourinary:  Negative for flank pain, frequency and hematuria.  Musculoskeletal:  Positive for falls. Negative for back pain and myalgias.  Skin:  Negative for rash.  Neurological:  Negative for dizziness, weakness and headaches.  Endo/Heme/Allergies:  Does not bruise/bleed easily.  Psychiatric/Behavioral:  Negative for depression. The patient is not nervous/anxious and does not have insomnia.     Vital Signs: Blood pressure 128/84, pulse 87, temperature 98 F (36.7 C), resp. rate 20, height _0  (1.778 m), weight 77.1 kg, SpO2 97 %.  Weight trends: Filed Weights   03/23/22 2029  Weight: 77.1 kg    Physical Exam: General: NAD  Head: Normocephalic, atraumatic. Dry oral mucosal membranes  Eyes: Anicteric  Lungs:  Upper respiratory crackles, normal effort, [place] O2, cough  Heart: Regular rate and rhythm  Abdomen:  Soft, nontender  Extremities:  No peripheral edema.  Neurologic: Nonfocal, moving all four extremities  Skin:  No lesions  Access: None     Lab results: Basic Metabolic Panel: Recent Labs  Lab 03/24/22 0323 03/25/22 0634 03/26/22 0318  NA 139 142 143  K 4.3 4.6 4.4  CL 112* 111 110  CO2 _0 GLUCOSE 141* 150* 140*  BUN 31* 41* 50*  CREATININE 1.40* 1.58* 1.90*  CALCIUM 9.0 8.4* 8.7*    Liver Function Tests: Recent Labs  Lab 03/23/22 2058  AST 25  ALT 16  ALKPHOS 68  BILITOT 1.0  PROT  6.1*  ALBUMIN 3.4*   No results for input(s): "LIPASE", "AMYLASE" in the last 168 hours. No results for input(s): "AMMONIA" in the last 168 hours.  CBC: Recent Labs  Lab 03/23/22 2058 03/24/22 0323 03/25/22 0634 03/26/22 0318  WBC 6.6 14.9* 10.2 14.5*  NEUTROABS 5.2  --   --   --   HGB 13.1 12.5* 11.6* 11.3*  HCT 39.5 38.2* 36.1* 34.6*  MCV 99.5 99.0 100.3* 100.0  PLT 144* 147* 107* 114*    Cardiac Enzymes: No results for input(s): "CKTOTAL", "CKMB", "CKMBINDEX", "TROPONINI" in the last 168 hours.  BNP: Invalid input(s): "POCBNP"  CBG: Recent Labs  Lab 03/24/22 2030 03/25/22 1642  GLUCAP 146* 197*    Microbiology: Results for orders placed or performed during the hospital encounter of 03/23/22  Surgical PCR screen     Status: None   Collection Time: 03/24/22  5:50 AM   Specimen: Nasal Mucosa; Nasal Swab  Result Value Ref Range Status   MRSA, PCR NEGATIVE NEGATIVE Final   Staphylococcus aureus NEGATIVE NEGATIVE Final    Comment: (NOTE) The Xpert SA Assay (FDA approved for NASAL specimens in patients 15 years of age and older), is one component of a comprehensive surveillance program. It is not intended to diagnose infection nor to guide or monitor treatment. Performed at [facility], [address] [place], [place] [address]     Coagulation Studies: Recent Labs    03/25/22 1930  LABPROT 15.6*  INR 1.3*    Urinalysis: No results for input(s): "COLORURINE", "LABSPEC", "PHURINE", "GLUCOSEU", "HGBUR", "BILIRUBINUR", "KETONESUR", "PROTEINUR", "UROBILINOGEN", "NITRITE", "LEUKOCYTESUR" in the last 72 hours.  Invalid input(s): "APPERANCEUR"    Imaging: ECHOCARDIOGRAM COMPLETE  Result Date: 03/26/2022    ECHOCARDIOGRAM REPORT   Patient Name:   Eric Hicks Date of Exam: 03/26/2022 Medical Rec #:  [id number]    Height:       70.0 in Accession #:    [id number]   Weight:       170.0 lb Date of Birth:  [birth year]    BSA:          1.948 m Patient Age:     37 years     BP:           126/75 mmHg Patient Gender: M            HR:           84 bpm. Exam Location:  [facility] Procedure: 2D Echo, Cardiac Doppler and Color Doppler Indications:     Abnormal ECG R94.31  History:         Patient has prior history of Echocardiogram examinations, most                  recent 06/29/2018. COPD; Risk Factors:Hypertension.  Sonographer:     Sherrie Sport Referring Phys:  [id number] [facility] AMIN Diagnosing Phys: Yolonda Kida MD  Sonographer Comments: Image quality was good. IMPRESSIONS  1. Left ventricular  ejection fraction, by estimation, is 55 to 60%. The left ventricle has normal function. The left ventricle has no regional wall motion abnormalities. There is mild concentric left ventricular hypertrophy. Left ventricular diastolic parameters are consistent with Grade I diastolic dysfunction (impaired relaxation).  2. Right ventricular systolic function is normal. The right ventricular size is normal.  3. Left atrial size was mildly dilated.  4. Right atrial size was mildly dilated.  5. The mitral valve is grossly normal. Mild mitral valve regurgitation.  6. The aortic valve is calcified. Aortic valve regurgitation is mild to moderate. FINDINGS  Left Ventricle: Left ventricular ejection fraction, by estimation, is 55 to 60%. The left ventricle has normal function. The left ventricle has no regional wall motion abnormalities. The left ventricular internal cavity size was normal in size. There is  mild concentric left ventricular hypertrophy. Left ventricular diastolic parameters are consistent with Grade I diastolic dysfunction (impaired relaxation). Right Ventricle: The right ventricular size is normal. No increase in right ventricular wall thickness. Right ventricular systolic function is normal. Left Atrium: Left atrial size was mildly dilated. Right Atrium: Right atrial size was mildly dilated. Pericardium: There is no evidence of pericardial effusion. Mitral Valve: The mitral valve is  grossly normal. There is mild thickening of the mitral valve leaflet(s). There is mild calcification of the mitral valve leaflet(s). Mildly decreased mobility of the mitral valve leaflets. Mild mitral annular calcification. Mild mitral valve regurgitation. Tricuspid Valve: The tricuspid valve is normal in structure. Tricuspid valve regurgitation is trivial. Aortic Valve: The aortic valve is calcified. Aortic valve regurgitation is mild to moderate. Aortic regurgitation PHT measures 396 msec. Aortic valve mean gradient measures 3.0 mmHg. Aortic valve peak gradient measures 5.5 mmHg. Aortic valve area, by VTI  measures 3.22 cm. Pulmonic Valve: The pulmonic valve was normal in structure. Pulmonic valve regurgitation is not visualized. Aorta: The ascending aorta was not well visualized. IAS/Shunts: No atrial level shunt detected by color flow Doppler.  LEFT VENTRICLE PLAX 2D LVOT diam:     2.20 cm LV SV:         55 LV SV Index:   28 LVOT Area:     3.80 cm  RIGHT VENTRICLE RV Basal diam:  4.60 cm RV Mid diam:    3.90 cm RV S prime:     11.40 cm/s TAPSE (M-mode): 1.2 cm LEFT ATRIUM             Index        RIGHT ATRIUM           Index LA diam:        4.10 cm 2.10 cm/m   RA Area:     28.00 cm LA Vol (A2C):   64.8 ml 33.26 ml/m  RA Volume:   83.20 ml  42.71 ml/m LA Vol (A4C):   48.5 ml 24.90 ml/m LA Biplane Vol: 57.0 ml 29.26 ml/m  AORTIC VALVE AV Area (Vmax):    2.98 cm AV Area (Vmean):   2.85 cm AV Area (VTI):     3.22 cm AV Vmax:           117.00 cm/s AV Vmean:          81.500 cm/s AV VTI:            0.171 m AV Peak Grad:      5.5 mmHg AV Mean Grad:      3.0 mmHg LVOT Vmax:         91.80 cm/s LVOT  Vmean:        61.000 cm/s LVOT VTI:          0.145 m LVOT/AV VTI ratio: 0.85 AI PHT:            396 msec  AORTA Ao Root diam: 3.90 cm MITRAL VALVE               TRICUSPID VALVE MV Area (PHT): 4.99 cm    TR Peak grad:   28.1 mmHg MV Decel Time: 152 msec    TR Vmax:        265.00 cm/s MV E velocity: 81.80 cm/s                             SHUNTS                            Systemic VTI:  0.14 m                            Systemic Diam: 2.20 cm Yolonda Kida MD Electronically signed by Yolonda Kida MD Signature Date/Time: 03/26/2022/1:43:34 PM    Final    US RENAL  Result Date: 03/26/2022 CLINICAL DATA:  Acute kidney injury EXAM: RENAL / URINARY TRACT ULTRASOUND COMPLETE COMPARISON:  None available FINDINGS: Right Kidney: Renal measurements: 9.3 x 5.1 x 4.6 cm = volume: 116 mL. Mild diffuse cortical thinning. No mass or hydronephrosis visualized. 9 mm nonobstructing calculus present in the upper pole. Left Kidney: Renal measurements: 10.7 x 5.8 x 3.9 cm = volume: 127 mL. Mild diffuse cortical thinning. No mass or hydronephrosis visualized. 1.6 cm simple cyst in the upper pole does not require further imaging follow-up. Bladder: Unable to evaluate due to collapse configuration. Other: None. IMPRESSION: 1. No hydronephrosis. 2. Mild diffuse cortical thinning of the kidneys consistent with chronic renal failure. 3. 9 mm nonobstructing calculus in the upper pole of the right kidney. Electronically Signed   By: Miachel Roux M.D.   On: 03/26/2022 11:31   NM Pulmonary Perfusion  Result Date: 03/26/2022 CLINICAL DATA:  Concern for pulmonary embolism. Respiratory distress. Worsening respiratory distress. Rapid breathing EXAM: NUCLEAR MEDICINE PERFUSION LUNG SCAN TECHNIQUE: Perfusion images were obtained in multiple projections after intravenous injection of radiopharmaceutical. RADIOPHARMACEUTICALS:  4.5 mCi Tc-75mMAA COMPARISON:  Chest radiograph 03/25/2022, CT 01/12/2022 insert FINDINGS: Wedge-shaped peripheral perfusion defect within LEFT upper lobe is not matched on comparison chest radiograph. Small perfusion defect within the lateral aspect of the RIGHT upper lobe is also unmatched. More central RIGHT upper lobe perfusion defect corresponds to mass on comparison radiograph. regional decreased perfusion to the LEFT lower  lobe. IMPRESSION: Unmatched wedge-shaped peripheral perfusion defects in the upper lobes are most consistent acute pulmonary emboli. These results will be called to the ordering clinician or representative by the Radiologist Assistant, and communication documented in the PACS or [employer] Electronically Signed   By: SSuzy BouchardM.D.   On: 03/26/2022 11:26   DG Chest Port 1 View  Result Date: 03/25/2022 CLINICAL DATA:  Hypoxia, history of right upper lobe lung cancer EXAM: PORTABLE CHEST 1 VIEW COMPARISON:  03/24/2022, 01/12/2022 FINDINGS: Single frontal view of the chest demonstrates persistent enlargement of the cardiac silhouette. No change in the central vascular congestion, bilateral perihilar airspace disease, and left pleural effusion. Stable right upper lobe consolidation consistent with history of lung  cancer and radiation therapy. No acute bony abnormalities. IMPRESSION: 1. Stable congestive heart failure. 2. Persistent right upper lobe consolidation and scarring, consistent with known history of lung cancer and prior radiation therapy. Electronically Signed   By: Randa Ngo M.D.   On: 03/25/2022 16:10   DG Chest Port 1 View  Result Date: 03/24/2022 CLINICAL DATA:  Hypoxia EXAM: PORTABLE CHEST 1 VIEW COMPARISON:  03/23/2022 FINDINGS: Cardiac shadow is enlarged but stable. Increasing vascular congestion with interstitial edema is noted. No sizable effusion is seen. No bony abnormality is noted. IMPRESSION: Increasing CHF. Electronically Signed   By: Inez Catalina M.D.   On: 03/24/2022 20:56   DG Pelvis Portable  Result Date: 03/24/2022 CLINICAL DATA:  Right hip replacement EXAM: PORTABLE PELVIS 1-2 VIEWS COMPARISON:  None Available. FINDINGS: Orthopedic view of the pelvis demonstrates surgical changes of right hip bipolar hemiarthroplasty. Periarticular gas is likely postsurgical in nature. Normal alignment. No unexpected fracture or dislocation. IMPRESSION: 1. Right hip  bipolar hemiarthroplasty. Electronically Signed   By: Fidela Salisbury M.D.   On: 03/24/2022 18:31     Assessment & Plan: Eric Hicks is a 88 y.o.  male with past medical history of hypertension, hyperlipidemia, and white coat syndrome, who was admitted to [facility] on 03/23/2022 for Closed right hip fracture ([place]) [S72.001A] Closed fracture of right hip, initial encounter ([place]) [S72.001A]  Acute kidney injury on chronic kidney disease stage IIIa. Baseline creatinine appears to be 1.34 on 03/08/22. Acute kidney injury likely secondary to cardiorenal syndrome. Renal ultrasound shows 89m non-obstructing calculus at the right upper pole. Received IV diuresis.Creatinine has increased to 1.9. Agree with holding diuretic for now. Avoid nephrotoxic agents and therapies, including hypotension.   Acute on chronic diastolic heart failure. ECHO completed today shows EF 55-60% with a Grade 1 diastolic dysfunction.   3. Acute respiratory failure with hypoxia, VQ scan positive for pulmonary embolism. Continue heparin drip and weaning oxygen requirements as tolerated.   4. Hypertension with chronic kidney disease. Home regimen includes carvedilol,  doxazosin and furosemide.   5. Secondary Hyperparathyroidism: with outpatient labs: PTH 65.2 on 07/18/20, phosphorus 3.3, calcium 8.1 on 08/14/21.   Lab Results  Component Value Date   PTH 30 03/30/2017   CALCIUM 8.7 (L) 03/26/2022    Patient prescribed calcium carbonate and cholecalciferol outpatient.   6. Anemia of chronic kidney disease  Lab Results  Component Value Date   HGB 11.3 (L) 03/26/2022    Hgb above desired target.   LOS: 3   12/1/20233:24 PM

## 2022-03-26 NOTE — Progress Notes (Signed)
ANTICOAGULATION CONSULT NOTE  Pharmacy Consult for Heparin Infusion Indication: ACS/STEMI  Allergies  Allergen Reactions   Tylenol [Acetaminophen] Rash    Patient Measurements: Height: 5\' 10"  (177.8 cm) Weight: 77.1 kg (170 lb) IBW/kg (Calculated) : 73 Heparin Dosing Weight: 77.1 kg  Vital Signs: Temp: 97.9 F (36.6 C) (12/01 0400) BP: 126/75 (12/01 0800) Pulse Rate: 84 (12/01 0800)  Labs: Recent Labs    03/24/22 0323 03/25/22 0634 03/25/22 1611 03/25/22 1930 03/26/22 0318  HGB 12.5* 11.6*  --   --  11.3*  HCT 38.2* 36.1*  --   --  34.6*  PLT 147* 107*  --   --  114*  APTT  --   --   --  28  --   LABPROT  --   --   --  15.6*  --   INR  --   --   --  1.3*  --   HEPARINUNFRC  --   --   --   --  0.36  CREATININE 1.40* 1.58*  --   --  1.90*  TROPONINIHS  --   --  219* 185*  --      Estimated Creatinine Clearance: 25.1 mL/min (A) (by C-G formula based on SCr of 1.9 mg/dL (H)).   Medical History: Past Medical History:  Diagnosis Date   BPH (benign prostatic hyperplasia)    managed by Olena Heckle   COPD (chronic obstructive pulmonary disease) ([facility])    Elevated PSA, between 10 and less than 20 ng/ml    Olena Heckle, watchful waiting   Hypertension    Osteoporosis    by DEXA    Medications:  Scheduled:   atorvastatin  20 mg Oral QODAY   calcium carbonate  1,250 mg Oral Q breakfast   carvedilol  6.25 mg Oral BID   cholecalciferol  1,000 Units Oral Daily   docusate sodium  100 mg Oral BID   dorzolamide-timolol  1 drop Both Eyes BID   doxazosin  1 mg Oral Daily   fluticasone furoate-vilanterol  1 puff Inhalation Daily   furosemide  40 mg Intravenous BID   latanoprost  1 drop Both Eyes QHS   mupirocin ointment  1 Application Nasal BID   Infusions:   heparin 900 Units/hr (03/25/22 2010)   PRN: menthol-cetylpyridinium **OR** phenol, metoCLOPramide **OR** metoCLOPramide (REGLAN) injection, morphine injection, ondansetron **OR** ondansetron (ZOFRAN) IV, oxyCODONE,  senna-docusate, traMADol  Assessment: Eric Hicks is a 88 y.o. male presenting with mechanical fall on 11/28. PMH significant for HTN, HLD, BPH, paroxysmal Afib (CHADSVASc 4), CKD3a, COPD. Patient was not on [facility] PTA per patient preference. Patient underwent hemiarthroplasty on 11/29. On 11/30 rapid response was called for worsening breathing status & increased O2 requirements. hsTrop peaked at 219. Prior to heparin infusion, patient was on enoxaparin SQ for DVT prophylaxis. Last dose of enoxaparin 30 mg was 11/30 @ 0825. Pharmacy has been consulted to initiate and manage heparin infusion.   Baseline Labs: aPTT 28, PT 15.6, INR 1.3, Hgb 11.6, Hct 36.1, Plt 107  Goal of Therapy:  Heparin level 0.3-0.7 units/ml Monitor platelets by anticoagulation protocol: Yes  Date Time HL Rate/Comment  12/1 0318 0.36 900/therapeutic x1 12/1 1156 0.22 900/SUBtherapeutic   Plan:  Give heparin bolus of 1000 units x1 Continue heparin infusion at 1050 units/hr Check HL 8 hours after rate change Continue to monitor H&H and platelets daily while on heparin infusion   Gretel Acre, PharmD PGY1 Pharmacy Resident 03/26/2022 8:48 AM

## 2022-03-26 NOTE — Progress Notes (Signed)
PT Cancellation Note  Patient Details Name: Eric Hicks MRN: [id number] DOB: 1927-03-11   Cancelled Treatment:    Reason Eval/Treat Not Completed: Medical issues which prohibited therapy (Chart reviewed for planned treatment session.  Noted with transfer to PCU s/p rapid response event; now positive for PE.  Will hold therapy at this time and continue efforts as medically appropriate (will need new/continue upon transfer order).)  Cannie Muckle H. Owens Shark, PT, DPT, NCS 03/26/22, 2:03 PM [phone]

## 2022-03-26 NOTE — NC FL2 (Addendum)
[place] LEVEL OF CARE FORM     IDENTIFICATION  Patient Name: Eric Hicks Birthdate: [birth year] Sex: male Admission Date (Current Location): 03/23/2022  [facility] [place] Number:  Engineering geologist and Address:  [facility], [address] [place], [place] [address]      Provider Number: [id number]  Attending Physician Name and Address:  Lorella Nimrod, MD  Relative Name and Phone Number:  Hilda Blades [phone]    Current Level of Care: Hospital Recommended Level of Care: [place] Prior Approval Number:    Date Approved/Denied:   PASRR Number:  [id number]  Discharge Plan: SNF    Current Diagnoses: Patient Active Problem List   Diagnosis Date Noted   Acute respiratory failure with hypoxia ([place]) 03/25/2022   Closed right hip fracture ([place]) 03/23/2022   Stage 3a chronic kidney disease (CKD) ([place]) 03/23/2022   Hyperlipidemia 03/23/2022   B12 deficiency 09/06/2021   Counseling regarding end of life decision making 03/04/2021   Lung cancer, upper lobe ([place]) 09/01/2019   Thoracic ascending aortic aneurysm ([place]) 09/01/2019   Nonischemic dilated cardiomyopathy ([place]) 07/08/2018   Paroxysmal atrial fibrillation ([place]) 07/08/2018   Aortic arch atherosclerosis ([place]) 07/08/2018   Low back pain 12/03/2017   OA (osteoarthritis) of neck 09/29/2017   Prostate cancer ([place]) 03/30/2016   S/p bilateral myringotomy with tube placement 03/30/2016   Hearing aid worn 03/30/2016   Chronic hip pain 04/13/2014   CKD (chronic kidney disease), stage II 11/12/2012   White coat syndrome with diagnosis of hypertension 11/12/2012   Medicare annual wellness visit, subsequent 11/10/2012   Osteoporosis 01/03/2012   History of adenomatous polyp of colon 11/14/2011   COPD (chronic obstructive pulmonary disease) with emphysema ([place]) 11/14/2011   BPH (benign prostatic hyperplasia)     Orientation RESPIRATION BLADDER Height & Weight      Self, Time, Situation, Place  O2 (HFNC 40L/min working on weaning) Incontinent, External catheter Weight: 170 lb (77.1 kg) Height:  5\' 10"  (177.8 cm)  BEHAVIORAL SYMPTOMS/MOOD NEUROLOGICAL BOWEL NUTRITION STATUS      Continent Diet (See Discharge summary)  AMBULATORY STATUS COMMUNICATION OF NEEDS Skin   Extensive Assist Verbally Other (Comment) (Right hip closed incison, right elbow skin tear, left lower arm skin tear.)                       Personal Care Assistance Level of Assistance  Bathing, Feeding, Total care, Dressing Bathing Assistance: Maximum assistance Feeding assistance: Limited assistance Dressing Assistance: Maximum assistance Total Care Assistance: Maximum assistance   Functional Limitations Info  Speech, Hearing, Sight Sight Info: Impaired Hearing Info: Impaired Speech Info: Adequate    SPECIAL CARE FACTORS FREQUENCY  OT (By licensed OT), PT (By licensed PT)     PT Frequency: 5 times a week OT Frequency: 5 times a week            Contractures Contractures Info: Not present    Additional Factors Info  Code Status, Allergies Code Status Info: Full Allergies Info: Tylenol           Current Medications (03/26/2022):  This is the current hospital active medication list Current Facility-Administered Medications  Medication Dose Route Frequency Provider Last Rate Last Admin   atorvastatin (LIPITOR) tablet 20 mg  20 mg Oral QODAY [place], Amy N, DO   20 mg at 03/26/22 9798   calcium carbonate (OS-CAL - dosed in mg of elemental calcium) tablet 1,250 mg  1,250 mg Oral Q breakfast Sprecher, Amy N, DO   1,250 mg at 03/26/22 4403   carvedilol (COREG) tablet 6.25 mg  6.25 mg Oral BID Swiger, Amy N, DO   6.25 mg at 03/26/22 4742   cholecalciferol (VITAMIN D3) tablet 1,000 Units  1,000 Units Oral Daily Turcott, Amy N, DO   1,000 Units at 03/26/22 5956   docusate sodium (COLACE) capsule 100 mg  100 mg Oral BID Steffanie Rainwater, MD   100 mg at 03/26/22 0833    dorzolamide-timolol (COSOPT) 2-0.5 % ophthalmic solution 1 drop  1 drop Both Eyes BID Lorella Nimrod, MD   1 drop at 03/26/22 0835   doxazosin (CARDURA) tablet 1 mg  1 mg Oral Daily Lorella Nimrod, MD   1 mg at 03/26/22 0834   fluticasone furoate-vilanterol (BREO ELLIPTA) 100-25 MCG/ACT 1 puff  1 puff Inhalation Daily Lorella Nimrod, MD   1 puff at 03/26/22 1129   furosemide (LASIX) injection 40 mg  40 mg Intravenous BID Lorella Nimrod, MD       heparin ADULT infusion 100 units/mL (25000 units/29mL)  900 Units/hr Intravenous Continuous Alison Murray, RPH 9 mL/hr at 03/25/22 2010 900 Units/hr at 03/25/22 2010   latanoprost (XALATAN) 0.005 % ophthalmic solution 1 drop  1 drop Both Eyes QHS Lorella Nimrod, MD   1 drop at 03/25/22 2311   menthol-cetylpyridinium (CEPACOL) lozenge 3 mg  1 lozenge Oral PRN Steffanie Rainwater, MD       Or   phenol (CHLORASEPTIC) mouth spray 1 spray  1 spray Mouth/Throat PRN Steffanie Rainwater, MD       metoCLOPramide (REGLAN) tablet 5-10 mg  5-10 mg Oral Q8H PRN Steffanie Rainwater, MD       Or   metoCLOPramide (REGLAN) injection 5-10 mg  5-10 mg Intravenous Q8H PRN Steffanie Rainwater, MD   10 mg at 03/25/22 3875   morphine (PF) 2 MG/ML injection 0.5-1 mg  0.5-1 mg Intravenous Q2H PRN Steffanie Rainwater, MD       mupirocin ointment (BACTROBAN) 2 % 1 Application  1 Application Nasal BID Sizer, Amy N, DO   1 Application at 64/33/29 0837   ondansetron (ZOFRAN) tablet 4 mg  4 mg Oral Q6H PRN Steffanie Rainwater, MD       Or   ondansetron (ZOFRAN) injection 4 mg  4 mg Intravenous Q6H PRN Steffanie Rainwater, MD       oxyCODONE (Oxy IR/ROXICODONE) immediate release tablet 5 mg  5 mg Oral Q6H PRN Heater, Amy N, DO   5 mg at 03/24/22 2312   senna-docusate (Senokot-S) tablet 1 tablet  1 tablet Oral QHS PRN Holm, Amy N, DO       traMADol (ULTRAM) tablet 50 mg  50 mg Oral Q6H PRN Steffanie Rainwater, MD   50 mg at 03/25/22 2310   SS# [phone]  Discharge Medications: Please see discharge summary for a  list of discharge medications.  Relevant Imaging Results:  Relevant Lab Results:   Additional Information    Radio broadcast assistant, LCSW

## 2022-03-26 NOTE — TOC Initial Note (Signed)
Transition of Care [facility]) - Initial/Assessment Note    Patient Details  Name: Eric Hicks MRN: [id number] Date of Birth: 12/19/1926  [facility] CM/SW Contact:    Gerilyn Pilgrim, LCSW Phone Number: 03/26/2022, 11:31 AM  Clinical Narrative:                 Met with patients family at bedside, daughter debra identified as HCPOA. Hilda Blades agreeable to SNF [place]. Referrals sent out pending bed offers at this time.   Expected Discharge Plan: Skilled Nursing Facility Barriers to Discharge: Continued Medical Work up   Patient Goals and CMS Choice Patient states their goals for this hospitalization and ongoing recovery are:: To Return home CMS Medicare.gov Compare Post Acute Care list provided to:: Legal Guardian [facility]) Choice offered to / list presented to : [place] / Guardian  Expected Discharge Plan and Services Expected Discharge Plan: [place] Acute Care Choice: [place] Living arrangements for the past 2 months: Single Family Home                                      Prior Living Arrangements/Services Living arrangements for the past 2 months: Single Family Home Lives with:: Relatives Patient language and need for interpreter reviewed:: No                 Activities of Daily Living Home Assistive Devices/Equipment: None ADL Screening (condition at time of admission) Patient's cognitive ability adequate to safely complete daily activities?: Yes Is the patient deaf or have difficulty hearing?: Yes Does the patient have difficulty seeing, even when wearing glasses/contacts?: No Does the patient have difficulty concentrating, remembering, or making decisions?: No Patient able to express need for assistance with ADLs?: Yes Does the patient have difficulty dressing or bathing?: Yes Independently performs ADLs?: No Communication: Independent Dressing (OT): Needs  assistance Is this a change from baseline?: Change from baseline, expected to last >3 days Grooming: Needs assistance Is this a change from baseline?: Change from baseline, expected to last >3 days Feeding: Independent Bathing: Needs assistance Is this a change from baseline?: Change from baseline, expected to last >3 days Toileting: Needs assistance Is this a change from baseline?: Change from baseline, expected to last >3days In/Out Bed: Needs assistance Is this a change from baseline?: Change from baseline, expected to last >3 days Walks in Home: Needs assistance Is this a change from baseline?: Change from baseline, expected to last >3 days Does the patient have difficulty walking or climbing stairs?: Yes Weakness of Legs: Right Weakness of Arms/Hands: None  Permission Sought/Granted      Share Information with NAME: Hilda Blades  Permission granted to share info w AGENCY: SNF'S  Permission granted to share info w Relationship: HCPOA  Permission granted to share info w Contact Information: [phone]  Emotional Assessment         Alcohol / Substance Use: Not Applicable Psych Involvement: No (comment)  Admission diagnosis:  Closed right hip fracture ([place]) [S72.001A] Closed fracture of right hip, initial encounter ([place]) [S72.001A] Patient Active Problem List   Diagnosis Date Noted   Acute respiratory failure with hypoxia ([place]) 03/25/2022   Closed right hip fracture ([place]) 03/23/2022   Stage 3a chronic kidney disease (CKD) ([place]) 03/23/2022   Hyperlipidemia 03/23/2022   B12 deficiency 09/06/2021   Counseling regarding  end of life decision making 03/04/2021   Lung cancer, upper lobe ([place]) 09/01/2019   Thoracic ascending aortic aneurysm ([place]) 09/01/2019   Nonischemic dilated cardiomyopathy ([place]) 07/08/2018   Paroxysmal atrial fibrillation ([place]) 07/08/2018   Aortic arch atherosclerosis ([place]) 07/08/2018   Low back pain 12/03/2017   OA (osteoarthritis) of neck 09/29/2017    Prostate cancer ([place]) 03/30/2016   S/p bilateral myringotomy with tube placement 03/30/2016   Hearing aid worn 03/30/2016   Chronic hip pain 04/13/2014   CKD (chronic kidney disease), stage II 11/12/2012   White coat syndrome with diagnosis of hypertension 11/12/2012   Medicare annual wellness visit, subsequent 11/10/2012   Osteoporosis 01/03/2012   History of adenomatous polyp of colon 11/14/2011   COPD (chronic obstructive pulmonary disease) with emphysema ([place]) 11/14/2011   BPH (benign prostatic hyperplasia)    PCP:  Crecencio Mc, MD Pharmacy:   [id number] [id number]: [phone]: [phone]    Social Determinants of Health (SDOH) Interventions    Readmission Risk Interventions     No data to display

## 2022-03-26 NOTE — Consult Note (Signed)
CARDIOLOGY CONSULT NOTE               Patient ID: Eric Hicks MRN: [id number] DOB/AGE: [birth year] 88 y.o.  Admit date: 03/23/2022 Referring Physician Dr Lorella Nimrod hospitalist Primary Physician Dr Deborra Medina out primary Primary Cardiologist Dr. Saralyn Pilar cardiologist Reason for Consultation acute dyspnea possible acute on chronic heart failure  HPI: Patient is a 88 year old male status post hip surgery hyperlipidemia hypertension.  Had an acute episode of shortness of breath difficulty breathing hypoxemia thought to may be related to heart failure but patient has elevated D-dimer unable to get a CT of the chest because of renal insufficiency so we will consider a VQ scan in the interim patient was placed on supplemental oxygen heparin therapy was also consideration with the patient may have aspirated because of coughing and choking at times but no evidence of infiltrate on chest x-ray.  Chronic atrial fibrillation has not been on anticoagulation because patient declined patient resting comfortably in bed asleep son at the bedside  Review of systems complete and found to be negative unless listed above     Past Medical History:  Diagnosis Date   BPH (benign prostatic hyperplasia)    managed by Olena Heckle   COPD (chronic obstructive pulmonary disease) (HCC)    Elevated PSA, between 10 and less than 20 ng/ml    Olena Heckle, watchful waiting   Hypertension    Osteoporosis    by DEXA    Past Surgical History:  Procedure Laterality Date   HIP ARTHROPLASTY Right 03/24/2022   Procedure: ARTHROPLASTY BIPOLAR HIP (HEMIARTHROPLASTY);  Surgeon: Steffanie Rainwater, MD;  Location: [facility] ORS;  Service: Orthopedics;  Laterality: Right;   TYMPANOSTOMY TUBE PLACEMENT  2017    Medications Prior to Admission  Medication Sig Dispense Refill Last Dose   ADVAIR DISKUS 100-50 MCG/ACT AEPB INHALE 1 PUFF INTO THE LUNGS TWICE A DAY 180 each 1 03/23/2022   Aflibercept (EYLEA IO) Inject into the eye.    Past Month   AMBULATORY NON FORMULARY MEDICATION Joint Advantage Gold 2 tablets daily   03/23/2022   atorvastatin (LIPITOR) 20 MG tablet TAKE 1 TABLET BY MOUTH EVERY OTHER DAY 45 tablet 3 03/22/2022   calcium carbonate (OS-CAL) 600 MG TABS Take 600 mg by mouth daily with breakfast.    03/23/2022   carvedilol (COREG) 6.25 MG tablet Take 6.25 mg by mouth 2 (two) times daily.   03/23/2022   dorzolamide-timolol (COSOPT) 22.3-6.8 MG/ML ophthalmic solution TAKE 1 DROP(S) IN RIGHT EYE 2 TIMES A DAY  5 03/23/2022   doxazosin (CARDURA) 1 MG tablet TAKE 1 TABLET BY MOUTH EVERY DAY 90 tablet 1 03/23/2022   furosemide (LASIX) 20 MG tablet TAKE 2 TABLETS BY MOUTH DAILY AS NEEDED 180 tablet 1 03/23/2022   latanoprost (XALATAN) 0.005 % ophthalmic solution Place 1 drop into both eyes at bedtime.  5 Past Month   Vitamin D, Cholecalciferol, 25 MCG (1000 UT) CAPS Take 1 capsule by mouth daily.   03/23/2022   leuprolide, 6 Month, (ELIGARD) 45 MG injection Inject 45 mg into the skin every 6 (six) months.      Social History   Socioeconomic History   Marital status: Widowed    Spouse name: Not on file   Number of children: Not on file   Years of education: Not on file   Highest education level: Not on file  Occupational History   Not on file  Tobacco Use   Smoking status: Former   Smokeless tobacco: Never  Vaping Use   Vaping Use: Never used  Substance and Sexual Activity   Alcohol use: No   Drug use: Not on file   Sexual activity: Never  Other Topics Concern   Not on file  Social History Narrative   Regular exercise-yew   Social Determinants of Health   Financial Resource Strain: Low Risk  (10/07/2021)   Overall Financial Resource Strain (CARDIA)    Difficulty of Paying Living Expenses: Not hard at all  Food Insecurity: No Food Insecurity (03/24/2022)   Hunger Vital Sign    Worried About Running Out of Food in the Last Year: Never true    Ran Out of Food in the Last Year: Never true   Transportation Needs: No Transportation Needs (03/24/2022)   PRAPARE - Hydrologist (Medical): No    Lack of Transportation (Non-Medical): No  Physical Activity: Not on file  Stress: No Stress Concern Present (10/07/2021)   [place]    Feeling of Stress : Not at all  Social Connections: Unknown (10/07/2021)   Social Connection and Isolation Panel [NHANES]    Frequency of Communication with Friends and Family: More than three times a week    Frequency of Social Gatherings with Friends and Family: Not on file    Attends Religious Services: Not on file    Active Member of Clubs or Organizations: Not on file    Attends Archivist Meetings: Not on file    Marital Status: Widowed  Intimate Partner Violence: Not At Risk (03/24/2022)   Humiliation, Afraid, Rape, and Kick questionnaire    Fear of Current or Ex-Partner: No    Emotionally Abused: No    Physically Abused: No    Sexually Abused: No    Family History  Problem Relation Age of Onset   Diabetes Mother    Hypertension Father    Cancer Brother 98       multiple myeloma      Review of systems complete and found to be negative unless listed above      PHYSICAL EXAM  General: Well developed, well nourished, in no acute distress supplemental oxygen in place HEENT:  Normocephalic and atramatic Neck:  No JVD.  Lungs: Diminished bilaterally to auscultation and percussion. Heart: HRRR . Normal S1 and S2 without gallops or murmurs.  Abdomen: Bowel sounds are positive, abdomen soft and non-tender  Msk:  Back normal, normal gait. Normal strength and tone for age. Extremities: No clubbing, cyanosis or edema.   Neuro: Alert and oriented X 3. Psych:  Good affect, responds appropriately  Labs:   Lab Results  Component Value Date   WBC 14.5 (H) 03/26/2022   HGB 11.3 (L) 03/26/2022   HCT 34.6 (L) 03/26/2022   MCV 100.0  03/26/2022   PLT 114 (L) 03/26/2022    Recent Labs  Lab 03/23/22 2058 03/24/22 0323 03/26/22 0318  NA 142   < > 143  K 4.4   < > 4.4  CL 111   < > 110  CO2 26   < > 26  BUN 28*   < > 50*  CREATININE 1.31*   < > 1.90*  CALCIUM 9.1   < > 8.7*  PROT 6.1*  --   --   BILITOT 1.0  --   --   ALKPHOS 68  --   --   ALT 16  --   --   AST 25  --   --  GLUCOSE 122*   < > 140*   < > = values in this interval not displayed.   Lab Results  Component Value Date   TROPONINI 0.09 [facility]) 06/14/2018    Lab Results  Component Value Date   CHOL 107 03/08/2022   CHOL 101 09/02/2021   CHOL 90 03/04/2021   Lab Results  Component Value Date   HDL 53.30 03/08/2022   HDL 52.40 09/02/2021   HDL 50.10 03/04/2021   Lab Results  Component Value Date   LDLCALC 36 03/08/2022   LDLCALC 32 09/02/2021   LDLCALC 29 03/04/2021   Lab Results  Component Value Date   TRIG 85.0 03/08/2022   TRIG 86.0 09/02/2021   TRIG 56.0 03/04/2021   Lab Results  Component Value Date   CHOLHDL 2 03/08/2022   CHOLHDL 2 09/02/2021   CHOLHDL 2 03/04/2021   Lab Results  Component Value Date   LDLDIRECT 32.0 03/08/2022   LDLDIRECT 28.0 09/02/2021      Radiology: ECHOCARDIOGRAM COMPLETE  Result Date: 03/26/2022    ECHOCARDIOGRAM REPORT   Patient Name:   Mckenzie C Boye Date of Exam: 03/26/2022 Medical Rec #:  [id number]    Height:       70.0 in Accession #:    [id number]   Weight:       170.0 lb Date of Birth:  Dec 03, 1926    BSA:          1.948 m Patient Age:    6 years     BP:           126/75 mmHg Patient Gender: M            HR:           84 bpm. Exam Location:  [facility] Procedure: 2D Echo, Cardiac Doppler and Color Doppler Indications:     Abnormal ECG R94.31  History:         Patient has prior history of Echocardiogram examinations, most                  recent 06/29/2018. COPD; Risk Factors:Hypertension.  Sonographer:     Sherrie Sport Referring Phys:  [id number] [facility] AMIN Diagnosing Phys: Yolonda Kida MD  Sonographer  Comments: Image quality was good. IMPRESSIONS  1. Left ventricular ejection fraction, by estimation, is 55 to 60%. The left ventricle has normal function. The left ventricle has no regional wall motion abnormalities. There is mild concentric left ventricular hypertrophy. Left ventricular diastolic parameters are consistent with Grade I diastolic dysfunction (impaired relaxation).  2. Right ventricular systolic function is normal. The right ventricular size is normal.  3. Left atrial size was mildly dilated.  4. Right atrial size was mildly dilated.  5. The mitral valve is grossly normal. Mild mitral valve regurgitation.  6. The aortic valve is calcified. Aortic valve regurgitation is mild to moderate. FINDINGS  Left Ventricle: Left ventricular ejection fraction, by estimation, is 55 to 60%. The left ventricle has normal function. The left ventricle has no regional wall motion abnormalities. The left ventricular internal cavity size was normal in size. There is  mild concentric left ventricular hypertrophy. Left ventricular diastolic parameters are consistent with Grade I diastolic dysfunction (impaired relaxation). Right Ventricle: The right ventricular size is normal. No increase in right ventricular wall thickness. Right ventricular systolic function is normal. Left Atrium: Left atrial size was mildly dilated. Right Atrium: Right atrial size was mildly dilated. Pericardium: There is no evidence of pericardial effusion. Mitral Valve:  The mitral valve is grossly normal. There is mild thickening of the mitral valve leaflet(s). There is mild calcification of the mitral valve leaflet(s). Mildly decreased mobility of the mitral valve leaflets. Mild mitral annular calcification. Mild mitral valve regurgitation. Tricuspid Valve: The tricuspid valve is normal in structure. Tricuspid valve regurgitation is trivial. Aortic Valve: The aortic valve is calcified. Aortic valve regurgitation is mild to moderate. Aortic  regurgitation PHT measures 396 msec. Aortic valve mean gradient measures 3.0 mmHg. Aortic valve peak gradient measures 5.5 mmHg. Aortic valve area, by VTI  measures 3.22 cm. Pulmonic Valve: The pulmonic valve was normal in structure. Pulmonic valve regurgitation is not visualized. Aorta: The ascending aorta was not well visualized. IAS/Shunts: No atrial level shunt detected by color flow Doppler.  LEFT VENTRICLE PLAX 2D LVOT diam:     2.20 cm LV SV:         55 LV SV Index:   28 LVOT Area:     3.80 cm  RIGHT VENTRICLE RV Basal diam:  4.60 cm RV Mid diam:    3.90 cm RV S prime:     11.40 cm/s TAPSE (M-mode): 1.2 cm LEFT ATRIUM             Index        RIGHT ATRIUM           Index LA diam:        4.10 cm 2.10 cm/m   RA Area:     28.00 cm LA Vol (A2C):   64.8 ml 33.26 ml/m  RA Volume:   83.20 ml  42.71 ml/m LA Vol (A4C):   48.5 ml 24.90 ml/m LA Biplane Vol: 57.0 ml 29.26 ml/m  AORTIC VALVE AV Area (Vmax):    2.98 cm AV Area (Vmean):   2.85 cm AV Area (VTI):     3.22 cm AV Vmax:           117.00 cm/s AV Vmean:          81.500 cm/s AV VTI:            0.171 m AV Peak Grad:      5.5 mmHg AV Mean Grad:      3.0 mmHg LVOT Vmax:         91.80 cm/s LVOT Vmean:        61.000 cm/s LVOT VTI:          0.145 m LVOT/AV VTI ratio: 0.85 AI PHT:            396 msec  AORTA Ao Root diam: 3.90 cm MITRAL VALVE               TRICUSPID VALVE MV Area (PHT): 4.99 cm    TR Peak grad:   28.1 mmHg MV Decel Time: 152 msec    TR Vmax:        265.00 cm/s MV E velocity: 81.80 cm/s                            SHUNTS                            Systemic VTI:  0.14 m                            Systemic Diam: 2.20 cm Yolonda Kida MD Electronically signed by Loran Senters  Sani Madariaga MD Signature Date/Time: 03/26/2022/1:43:34 PM    Final    US RENAL  Result Date: 03/26/2022 CLINICAL DATA:  Acute kidney injury EXAM: RENAL / URINARY TRACT ULTRASOUND COMPLETE COMPARISON:  None available FINDINGS: Right Kidney: Renal measurements: 9.3 x 5.1 x 4.6 cm  = volume: 116 mL. Mild diffuse cortical thinning. No mass or hydronephrosis visualized. 9 mm nonobstructing calculus present in the upper pole. Left Kidney: Renal measurements: 10.7 x 5.8 x 3.9 cm = volume: 127 mL. Mild diffuse cortical thinning. No mass or hydronephrosis visualized. 1.6 cm simple cyst in the upper pole does not require further imaging follow-up. Bladder: Unable to evaluate due to collapse configuration. Other: None. IMPRESSION: 1. No hydronephrosis. 2. Mild diffuse cortical thinning of the kidneys consistent with chronic renal failure. 3. 9 mm nonobstructing calculus in the upper pole of the right kidney. Electronically Signed   By: Miachel Roux M.D.   On: 03/26/2022 11:31   NM Pulmonary Perfusion  Result Date: 03/26/2022 CLINICAL DATA:  Concern for pulmonary embolism. Respiratory distress. Worsening respiratory distress. Rapid breathing EXAM: NUCLEAR MEDICINE PERFUSION LUNG SCAN TECHNIQUE: Perfusion images were obtained in multiple projections after intravenous injection of radiopharmaceutical. RADIOPHARMACEUTICALS:  4.5 mCi Tc-59mMAA COMPARISON:  Chest radiograph 03/25/2022, CT 01/12/2022 insert FINDINGS: Wedge-shaped peripheral perfusion defect within LEFT upper lobe is not matched on comparison chest radiograph. Small perfusion defect within the lateral aspect of the RIGHT upper lobe is also unmatched. More central RIGHT upper lobe perfusion defect corresponds to mass on comparison radiograph. regional decreased perfusion to the LEFT lower lobe. IMPRESSION: Unmatched wedge-shaped peripheral perfusion defects in the upper lobes are most consistent acute pulmonary emboli. These results will be called to the ordering clinician or representative by the Radiologist Assistant, and communication documented in the PACS or [employer] Electronically Signed   By: SSuzy BouchardM.D.   On: 03/26/2022 11:26   DG Chest Port 1 View  Result Date: 03/25/2022 CLINICAL DATA:  Hypoxia, history  of right upper lobe lung cancer EXAM: PORTABLE CHEST 1 VIEW COMPARISON:  03/24/2022, 01/12/2022 FINDINGS: Single frontal view of the chest demonstrates persistent enlargement of the cardiac silhouette. No change in the central vascular congestion, bilateral perihilar airspace disease, and left pleural effusion. Stable right upper lobe consolidation consistent with history of lung cancer and radiation therapy. No acute bony abnormalities. IMPRESSION: 1. Stable congestive heart failure. 2. Persistent right upper lobe consolidation and scarring, consistent with known history of lung cancer and prior radiation therapy. Electronically Signed   By: MRanda NgoM.D.   On: 03/25/2022 16:10   DG Chest Port 1 View  Result Date: 03/24/2022 CLINICAL DATA:  Hypoxia EXAM: PORTABLE CHEST 1 VIEW COMPARISON:  03/23/2022 FINDINGS: Cardiac shadow is enlarged but stable. Increasing vascular congestion with interstitial edema is noted. No sizable effusion is seen. No bony abnormality is noted. IMPRESSION: Increasing CHF. Electronically Signed   By: MInez CatalinaM.D.   On: 03/24/2022 20:56   DG Pelvis Portable  Result Date: 03/24/2022 CLINICAL DATA:  Right hip replacement EXAM: PORTABLE PELVIS 1-2 VIEWS COMPARISON:  None Available. FINDINGS: Orthopedic view of the pelvis demonstrates surgical changes of right hip bipolar hemiarthroplasty. Periarticular gas is likely postsurgical in nature. Normal alignment. No unexpected fracture or dislocation. IMPRESSION: 1. Right hip bipolar hemiarthroplasty. Electronically Signed   By: AFidela SalisburyM.D.   On: 03/24/2022 18:31   DG FEMUR, MIN 2 VIEWS RIGHT  Result Date: 03/24/2022 CLINICAL DATA:  Closed right hip fracture. EXAM: RIGHT FEMUR  2 VIEWS COMPARISON:  Pelvis and right hip radiographs 03/23/2022 FINDINGS: There is diffuse decreased bone mineralization. Redemonstration of mildly impacted proximal right femoral neck fracture. There is mild anterior apex angulation and likely  approximately 8 mm anterior displacement of the distal fracture component with respect to the proximal fracture component. Mild-to-moderate right femoroacetabular joint space narrowing. Mild tricompartmental osteoarthritis of the right knee. Vascular phleboliths overlie the pelvis. Mild-to-moderate arterial vascular calcifications. IMPRESSION: Acute mildly impacted and angulated proximal right femoral neck fracture, similar to prior. Electronically Signed   By: Yvonne Kendall M.D.   On: 03/24/2022 08:40   CT Head Wo Contrast  Result Date: 03/23/2022 CLINICAL DATA:  Head trauma, minor (Age >= 65y); Neck trauma (Age >= 65y) EXAM: CT HEAD WITHOUT CONTRAST CT CERVICAL SPINE WITHOUT CONTRAST TECHNIQUE: Multidetector CT imaging of the head and cervical spine was performed following the standard protocol without intravenous contrast. Multiplanar CT image reconstructions of the cervical spine were also generated. RADIATION DOSE REDUCTION: This exam was performed according to the departmental dose-optimization program which includes automated exposure control, adjustment of the mA and/or kV according to patient size and/or use of iterative reconstruction technique. COMPARISON:  CT chest 01/12/2022, CT chest 01/09/2021 FINDINGS: CT HEAD FINDINGS Brain: Cerebral ventricle sizes are concordant with the degree of cerebral volume loss. Patchy and confluent areas of decreased attenuation are noted throughout the deep and periventricular white matter of the cerebral hemispheres bilaterally, compatible with chronic microvascular ischemic disease. No evidence of large-territorial acute infarction. No parenchymal hemorrhage. No mass lesion. No extra-axial collection. No mass effect or midline shift. No hydrocephalus. Basilar cisterns are patent. Vascular: No hyperdense vessel. Atherosclerotic calcifications are present within the cavernous internal carotid and vertebral arteries. Skull: No acute fracture or focal lesion.  Sinuses/Orbits: Paranasal sinuses and mastoid air cells are clear. Bilateral lens replacement. Otherwise the orbits are unremarkable. Other: None. CT CERVICAL SPINE FINDINGS Alignment: Normal. Skull base and vertebrae: Multilevel severe degenerative changes spine most prominent at the C1-C2 and C5-C6 levels. Associated moderate to severe osseous neural foraminal stenosis at the left C3-C4 level. No acute fracture. No aggressive appearing focal osseous lesion or focal pathologic process. Soft tissues and spinal canal: No prevertebral fluid or swelling. No visible canal hematoma. Upper chest: Partially visualized right apical masslike airspace opacity measuring up to at least 4.2 cm. Other: None. IMPRESSION: 1. No acute intracranial abnormality. 2. No acute displaced fracture or traumatic listhesis of the cervical spine. 3. Partially visualized right apical masslike airspace opacity measuring up to at least 4.2 cm. 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 Electronically Signed   By: Iven Finn M.D.   On: 03/23/2022 20:56   CT Cervical Spine Wo Contrast  Result Date: 03/23/2022 CLINICAL DATA:  Head trauma, minor (Age >= 65y); Neck trauma (Age >= 65y) EXAM: CT HEAD WITHOUT CONTRAST CT CERVICAL SPINE WITHOUT CONTRAST TECHNIQUE: Multidetector CT imaging of the head and cervical spine was performed following the standard protocol without intravenous contrast. Multiplanar CT image reconstructions of the cervical spine were also generated. RADIATION DOSE REDUCTION: This exam was performed according to the departmental dose-optimization program which includes automated exposure control, adjustment of the mA and/or kV according to patient size and/or use of iterative reconstruction technique. COMPARISON:  CT chest 01/12/2022, CT chest 01/09/2021 FINDINGS: CT HEAD FINDINGS Brain: Cerebral ventricle sizes are concordant with the degree of  cerebral volume loss. Patchy and confluent areas of decreased attenuation are noted throughout  the deep and periventricular white matter of the cerebral hemispheres bilaterally, compatible with chronic microvascular ischemic disease. No evidence of large-territorial acute infarction. No parenchymal hemorrhage. No mass lesion. No extra-axial collection. No mass effect or midline shift. No hydrocephalus. Basilar cisterns are patent. Vascular: No hyperdense vessel. Atherosclerotic calcifications are present within the cavernous internal carotid and vertebral arteries. Skull: No acute fracture or focal lesion. Sinuses/Orbits: Paranasal sinuses and mastoid air cells are clear. Bilateral lens replacement. Otherwise the orbits are unremarkable. Other: None. CT CERVICAL SPINE FINDINGS Alignment: Normal. Skull base and vertebrae: Multilevel severe degenerative changes spine most prominent at the C1-C2 and C5-C6 levels. Associated moderate to severe osseous neural foraminal stenosis at the left C3-C4 level. No acute fracture. No aggressive appearing focal osseous lesion or focal pathologic process. Soft tissues and spinal canal: No prevertebral fluid or swelling. No visible canal hematoma. Upper chest: Partially visualized right apical masslike airspace opacity measuring up to at least 4.2 cm. Other: None. IMPRESSION: 1. No acute intracranial abnormality. 2. No acute displaced fracture or traumatic listhesis of the cervical spine. 3. Partially visualized right apical masslike airspace opacity measuring up to at least 4.2 cm. 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 Electronically Signed   By: Iven Finn M.D.   On: 03/23/2022 20:56   DG Elbow Complete Right  Result Date: 03/23/2022 CLINICAL DATA:  Fall EXAM: RIGHT ELBOW - COMPLETE 4 VIEW COMPARISON:  None Available. FINDINGS: There is no evidence of fracture, dislocation, or joint effusion.  There is no evidence of arthropathy or other focal bone abnormality. Soft tissues are unremarkable. IMPRESSION: Negative. Electronically Signed   By: Sammie Bench M.D.   On: 03/23/2022 20:55   DG Chest 1 View  Result Date: 03/23/2022 CLINICAL DATA:  Fall. EXAM: CHEST  1 VIEW COMPARISON:  January 16, 2019. FINDINGS: Stable cardiomegaly. Both lungs are clear. The visualized skeletal structures are unremarkable. IMPRESSION: No active disease. Electronically Signed   By: Marijo Conception M.D.   On: 03/23/2022 20:23   DG Hip Unilat W or Wo Pelvis 2-3 Views Right  Result Date: 03/23/2022 CLINICAL DATA:  Right hip pain after fall. EXAM: DG HIP (WITH OR WITHOUT PELVIS) 2-3V RIGHT COMPARISON:  April 11, 2014. FINDINGS: Mildly impacted right femoral neck fracture is noted. Left hip is unremarkable. IMPRESSION: Mildly impacted right femoral neck fracture Electronically Signed   By: Marijo Conception M.D.   On: 03/23/2022 20:22    EKG: Pending  ASSESSMENT AND PLAN:  Acute respiratory failure Acute dyspnea Pulmonary emboli Acute on chronic congestive heart failure Consider possible aspiration Status post orthopedic procedure Hypoxemia Chronic renal insufficiency stage III Nonischemic dilated cardiomyopathy diastolic Paroxysmal atrial fibrillation Hyperlipidemia . Plan Episode of acute dyspnea shortness of breath respiratory failure unclear etiology possibly related to acute pulmonary embolism aspiration versus congestive heart failure Agree with VQ scan for evaluation of possible PE and anticoagulation is necessary with elevated D-dimer Consider aspiration with coughing and choking whether broad-spectrum antibiotic therapy is indicated consider noncontrast CT of the chest which may be helpful Diuretic therapy for chronic diastolic congestive heart failure Continue supplemental oxygen therapy for hypoxemia Maintain Lipitor for hyperlipidemia .    Signed: Yolonda Kida  MD 03/26/2022, 8:49 PM

## 2022-03-26 NOTE — Progress Notes (Signed)
ANTICOAGULATION CONSULT NOTE  Pharmacy Consult for heparin Indication: ACS/STEMI  Allergies  Allergen Reactions   Tylenol [Acetaminophen] Rash    Patient Measurements: Height: 5\' 10"  (177.8 cm) Weight: 77.1 kg (170 lb) IBW/kg (Calculated) : 73 Heparin Dosing Weight: 77.1 kg  Vital Signs: Temp: 98.6 F (37 C) (11/30 2355) Temp Source: Axillary (11/30 2019) BP: 113/71 (11/30 2355) Pulse Rate: 83 (11/30 2355)  Labs: Recent Labs    03/24/22 0323 03/25/22 0634 03/25/22 1611 03/25/22 1930 03/26/22 0318  HGB 12.5* 11.6*  --   --  11.3*  HCT 38.2* 36.1*  --   --  34.6*  PLT 147* 107*  --   --  114*  APTT  --   --   --  28  --   LABPROT  --   --   --  15.6*  --   INR  --   --   --  1.3*  --   HEPARINUNFRC  --   --   --   --  0.36  CREATININE 1.40* 1.58*  --   --  1.90*  TROPONINIHS  --   --  219* 185*  --      Estimated Creatinine Clearance: 25.1 mL/min (A) (by C-G formula based on SCr of 1.9 mg/dL (H)).   Medical History: Past Medical History:  Diagnosis Date   BPH (benign prostatic hyperplasia)    managed by Olena Heckle   COPD (chronic obstructive pulmonary disease) (HCC)    Elevated PSA, between 10 and less than 20 ng/ml    Olena Heckle, watchful waiting   Hypertension    Osteoporosis    by DEXA    Medications:  No PTA AC meds Last dose enxaparin ppx 11/30 0825   Assessment: Pharmacy consulted to dose heparin in 88 yo M admitted on 11/28 for mechanical fall.  11/30 pt was noted to be in respiratory distress with elevated troponin I.  Baseline Labs:  Hgb 11.6, Hct 36.1, Plts 107 aPTT and INR ordered   Goal of Therapy:  Heparin level 0.3-0.7 units/ml Monitor platelets by anticoagulation protocol: Yes  12/01 0318 HL 0.36, therapeutic x 1   Plan:  Continue heparin infusion at 900 units/hr Will recheck HL in 8 hr to confirm  CBC daily while on heparin  Renda Rolls, PharmD, [facility] 03/26/2022 3:49 AM

## 2022-03-26 NOTE — Progress Notes (Addendum)
Subjective: 2 Days Post-Op Procedure(s) (LRB): ARTHROPLASTY BIPOLAR HIP (HEMIARTHROPLASTY) (Right) Patient reports pain as mild.  Patient with some shortness of breath.  Rapid response called, patient moved to the second floor is currently scheduled to have echo and VQ scan.  Right hip pain is very mild. Plan is to go Skilled nursing facility after hospital stay.  Objective: Vital signs in last 24 hours: Temp:  [97.9 F (36.6 C)-98.6 F (37 C)] 97.9 F (36.6 C) (12/01 0400) Pulse Rate:  [82-94] 84 (12/01 0800) Resp:  [13-19] 13 (12/01 0800) BP: (113-139)/(71-89) 126/75 (12/01 0800) SpO2:  [91 %-98 %] 94 % (12/01 0800) FiO2 (%):  [45 %-80 %] 45 % (12/01 0750)  Intake/Output from previous day: 11/30 0701 - 12/01 0700 In: 119 [I.V.:119] Out: 750 [Urine:750] Intake/Output this shift: Total I/O In: 135 [P.O.:90; I.V.:45] Out: 125 [Urine:125]  Recent Labs    03/23/22 2058 03/24/22 0323 03/25/22 0634 03/26/22 0318  HGB 13.1 12.5* 11.6* 11.3*   Recent Labs    03/25/22 0634 03/26/22 0318  WBC 10.2 14.5*  RBC 3.60*  3.61* 3.46*  HCT 36.1* 34.6*  PLT 107* 114*   Recent Labs    03/25/22 0634 03/26/22 0318  NA 142 143  K 4.6 4.4  CL 111 110  CO2 25 26  BUN 41* 50*  CREATININE 1.58* 1.90*  GLUCOSE 150* 140*  CALCIUM 8.4* 8.7*   Recent Labs    03/25/22 1930  INR 1.3*    EXAM General - Patient is Alert, Appropriate, and Oriented. No distress. Extremity - Neurovascular intact Sensation intact distally Intact pulses distally Dorsiflexion/Plantar flexion intact No swelling in the lower extremities.  Negative Homans' sign bilaterally. Dressing - dressing C/D/I and no drainage Motor Function - intact, moving foot and toes well on exam.   Past Medical History:  Diagnosis Date   BPH (benign prostatic hyperplasia)    managed by Olena Heckle   COPD (chronic obstructive pulmonary disease) ([place])    Elevated PSA, between 10 and less than 20 ng/ml    Olena Heckle,  watchful waiting   Hypertension    Osteoporosis    by DEXA    Assessment/Plan:   2 Days Post-Op Procedure(s) (LRB): ARTHROPLASTY BIPOLAR HIP (HEMIARTHROPLASTY) (Right) Principal Problem:   Closed right hip fracture (HCC) Active Problems:   BPH (benign prostatic hyperplasia)   White coat syndrome with diagnosis of hypertension   Nonischemic dilated cardiomyopathy (HCC)   Paroxysmal atrial fibrillation (HCC)   Stage 3a chronic kidney disease (CKD) ([place])   Hyperlipidemia   Acute respiratory failure with hypoxia (HCC)  Estimated body mass index is 24.39 kg/m as calculated from the following:   Height as of this encounter: 5\' 10"  (1.778 m).   Weight as of this encounter: 77.1 kg. Advance diet Up with therapy Work on BM Vital signs are stable Labs are stable, hemoglobin 11.3 Recheck labs in the am Shortness of breath -internal medicine managing, pending some test studies, echo and VQ scan.  Cardiology also following. CM to assist with discharge to SNF  Patient will need 2 week follow up with [facility] ortho    DVT Prophylaxis -patient switched to heparin Weight-Bearing as tolerated to right leg   T. Rachelle Hora, PA-C [facility] 03/26/2022, 12:33 PM  Patient seen and examined earlier today, agree with above plan. Patient found to have acute PE on VQ scan. Ok with heparin drip from orthopedic perspective. He may WBAT on the hip and continue PT as tolerated,.  Steffanie Rainwater MD

## 2022-03-26 NOTE — Progress Notes (Signed)
ANTICOAGULATION CONSULT NOTE  Pharmacy Consult for Heparin Infusion Indication: ACS/STEMI  Allergies  Allergen Reactions   Tylenol [Acetaminophen] Rash    Patient Measurements: Height: 5\' 10"  (177.8 cm) Weight: 77.1 kg (170 lb) IBW/kg (Calculated) : 73 Heparin Dosing Weight: 77.1 kg  Vital Signs: Temp: 98.6 F (37 C) (12/01 1957) Temp Source: Axillary (12/01 1957) BP: 114/83 (12/01 1957) Pulse Rate: 94 (12/01 1957)  Labs: Recent Labs    03/24/22 0323 03/25/22 0634 03/25/22 1611 03/25/22 1930 03/26/22 0318 03/26/22 0745 03/26/22 0921 03/26/22 1156 03/26/22 2102  HGB 12.5* 11.6*  --   --  11.3*  --   --   --   --   HCT 38.2* 36.1*  --   --  34.6*  --   --   --   --   PLT 147* 107*  --   --  114*  --   --   --   --   APTT  --   --   --  28  --   --   --   --   --   LABPROT  --   --   --  15.6*  --   --   --   --   --   INR  --   --   --  1.3*  --   --   --   --   --   HEPARINUNFRC  --   --   --   --  0.36  --   --  0.22* 0.31  CREATININE 1.40* 1.58*  --   --  1.90*  --   --   --   --   TROPONINIHS  --   --    < > 185*  --  141* 139*  --   --    < > = values in this interval not displayed.     Estimated Creatinine Clearance: 25.1 mL/min (A) (by C-G formula based on SCr of 1.9 mg/dL (H)).   Medical History: Past Medical History:  Diagnosis Date   BPH (benign prostatic hyperplasia)    managed by Olena Heckle   COPD (chronic obstructive pulmonary disease) (HCC)    Elevated PSA, between 10 and less than 20 ng/ml    Olena Heckle, watchful waiting   Hypertension    Osteoporosis    by DEXA    Medications:  Scheduled:   atorvastatin  20 mg Oral QODAY   calcium carbonate  1,250 mg Oral Q breakfast   carvedilol  6.25 mg Oral BID   cholecalciferol  1,000 Units Oral Daily   docusate sodium  100 mg Oral BID   dorzolamide-timolol  1 drop Both Eyes BID   doxazosin  1 mg Oral Daily   fluticasone furoate-vilanterol  1 puff Inhalation Daily   latanoprost  1 drop Both Eyes  QHS   mupirocin ointment  1 Application Nasal BID   Infusions:   heparin 1,050 Units/hr (03/26/22 1533)   PRN: menthol-cetylpyridinium **OR** phenol, metoCLOPramide **OR** metoCLOPramide (REGLAN) injection, morphine injection, ondansetron **OR** ondansetron (ZOFRAN) IV, oxyCODONE, senna-docusate, traMADol  Assessment: Eric Hicks is a 88 y.o. male presenting with mechanical fall on 11/28. PMH significant for HTN, HLD, BPH, paroxysmal Afib (CHADSVASc 4), CKD3a, COPD. Patient was not on [place] PTA per patient preference. Patient underwent hemiarthroplasty on 11/29. On 11/30 rapid response was called for worsening breathing status & increased O2 requirements. hsTrop peaked at 219. Prior to heparin infusion, patient was on enoxaparin SQ for  DVT prophylaxis. Last dose of enoxaparin 30 mg was 11/30 @ 0825. Pharmacy has been consulted to initiate and manage heparin infusion.   Baseline Labs: aPTT 28, PT 15.6, INR 1.3, Hgb 11.6, Hct 36.1, Plt 107  Goal of Therapy:  Heparin level 0.3-0.7 units/ml Monitor platelets by anticoagulation protocol: Yes  Date Time HL Rate/Comment  12/1 0318 0.36 900/therapeutic x1 12/1 1156 0.22 900/SUBtherapeutic 12/01 2102 0.31 Therapeutic x 1   Plan:  Continue heparin infusion at 1050 units/hr Recheck HL w/ AM labs to confirm Continue to monitor H&H and platelets daily while on heparin infusion   Renda Rolls, PharmD, [facility] 03/26/2022 9:59 PM

## 2022-03-26 NOTE — Progress Notes (Signed)
Progress Note   Patient: Eric Hicks DOB: February 22, 1927 DOA: 03/23/2022     3 DOS: the patient was seen and examined on 03/26/2022   Brief hospital course: Eric Hicks is a 88 year old male with history of hyperlipidemia, hypertension, impaired fasting glucose, history of whitecoat syndrome, who presents emergency department for chief concerns of mechanical fall and falling on his right hip and elbow, resulted and mildly impacted right femoral neck fracture  Patient denies loss of consciousness and hitting his head.  Initial vitals in the emergency department showed temperature of 97.6, respiration rate 18, heart rate 78, blood pressure 158/106, SpO2 of 96% on room air.  Labs in the ED showed serum sodium 142, potassium 4.4, bicarb 26, BUN 28, serum creatinine 1.31, GFR 51, nonfasting glucose 122. WBC 6.6, hemoglobin 13.1, platelets 144. CT head, CT cervical spine, CT chest and EKG elbow was without any acute abnormality. DG hip with mildly impacted right femoral neck fracture  ED treatment: Tdap booster has been ordered for injection.  Oxycodone 5 mg p.o. one-time dose.  11/29: Hemodynamically stable on 3 L oxygen, labs pertinent for leukocytosis, most likely reactive, hemoglobin decreased to 12.5 from 13.1, BMP with slight worsening of BUN 31 and creatinine 1.40 which remained around his baseline of 1.3-1.4. Going to the OR with orthopedic surgery today.  11/30: Patient underwent hemiarthroplasty with orthopedic surgery on 11/29.  Required transfer Amick acid twice for bleeding.  Also received 1 dose of IV Lasix in PACU. Blood pressure mildly elevated at 142/82 this morning.  Renal function with slight deterioration to 41/1.58, GFR decreased to 41 from 47. Anemia panel with normal iron and low normal folate, B12 pending. Patient also with new oxygen requirement-history of COPD-no wheezing. Encouraging incentive spirometry and flutter valve. Chest x-ray with stable central  vascular congestion, bilateral perihilar airspace disease left pleural effusion and BNP markedly elevated at 1960.  Patient was given IV Lasix and started on regular diuresis.  Also found to have elevated troponin, patient was started on heparin infusion and cardiology was consulted.  12/1: Patient overnight with worsening respiratory status, started on heated high flow with FiO2 of 80%, which is now weaned to 55% and 45L.  D-dimer was also found to be elevated at 2.51 which also makes PE possible.  VQ scan ordered for concern of PE, trying to avoid contrast with CTA due to AKI.  VQ scan with acute PE.   Worsening renal function today with BUN 50, creatinine 1.90.  Renal ultrasound with no hydronephrosis , some cortical thinning and no other significant abnormality. nephrology was also consulted. Holding a.m. dose of Lasix. Echocardiogram with improvement of EF to 50 to 62%, grade 1 diastolic dysfunction and no regional wall motion abnormalities.  No right heart strain mentioned. I will let cardiology decide about further diuresis.  Patient might get benefit from Lasix infusion for gentle diuresis. Also sending message to vascular surgery due to worsening respiratory status.  Patient with multiorgan failure, high risk for deterioration and mortality.  Discussed with his 2 sons and daughter at bedside.  They do have some legal papers which tells that he does not want extraordinary measures like feeding tube.  Not sure about DNR, he is currently full code.  Discussed with family to decide.  Also consulting palliative care.  Assessment and Plan: * Acute respiratory failure with hypoxia ([place]) Patient with sudden onset worsening hypoxia, currently at heated high flow with 45 L and FiO2 of 55%.  Saturating barely  in low 90s.  VQ scan with acute pulmonary embolism.  Elevated BNP and echocardiogram with improvement of his EF, no regional wall motion abnormalities and grade 1 diastolic dysfunction. -Continue  with heparin infusion.  Acute pulmonary embolism (HCC) VQ scan positive for acute pulmonary embolism.  Unable to do CTA due to AKI. Worsening respiratory status. Sent a message to vascular surgery to explore other possibilities. -Continue with heparin infusion  Closed right hip fracture ([place]) Secondary to mechanical fall, s/p hemiarthroplasty on 10/29. -PT/OT evaluation -Continue with pain management-try avoiding opioids.  Stage 3a chronic kidney disease (CKD) ([place]) Patient developed AKI with CKD stage IIIa.  Worsening creatinine after getting IV Lasix for acute pulmonary edema and hypoxia. Renal ultrasound with cortical thickening and no hydronephrosis. Nephrology consult. Holding more Lasix -Monitor renal function -Avoid nephrotoxins  Nonischemic dilated cardiomyopathy ([place]) Repeat echocardiogram with improvement of EF to 50 to 55% with grade 1 diastolic dysfunction.  No regional wall motion abnormalities or right ventricular strain noted.  Troponin elevated most likely secondary to demand with acute pulmonary embolism and respiratory distress.  BNP at 1963. Patient did receive IV Lasix due to pulmonary vascular congestion and respiratory distress resulted in worsening of AKI. -Cardiology consult -Continue home carvedilol and statin -Holding further Lasix-will let nephrology and cardiology decide  White coat syndrome with diagnosis of hypertension Blood pressure mostly within goal. -Continue carvedilol and Cardura  BPH (benign prostatic hyperplasia) -Continue Cardura  Paroxysmal atrial fibrillation (HCC) Chronic atrial fibrillation and sees [facility] cardiology - Per note on 02/11/2022, patient has declined anticoagulation therefore he is not on anticoagulation - Patient is on carvedilol 6.25 mg p.o. twice daily, this was resumed  Hyperlipidemia - Atorvastatin 20 mg daily resumed   Subjective: Patient continued to have significant respiratory distress, on 45 L of  oxygen with heated high flow.  Physical Exam: Vitals:   03/26/22 0750 03/26/22 0800 03/26/22 1200 03/26/22 1449  BP:  126/75 128/84   Pulse:  84 87   Resp:  13 20   Temp:   98 F (36.7 C)   TempSrc:      SpO2: 95% 94% 92% 97%  Weight:      Height:       General.  Frail and lethargic elderly man, Pulmonary.  Few basal crackles bilaterally, mildly increased work of breathing CV.  Regular rate and rhythm, no JVD, rub or murmur. Abdomen.  Soft, nontender, nondistended, BS positive. CNS.  Alert and oriented .  No focal neurologic deficit. Extremities.  No edema, no cyanosis, pulses intact and symmetrical. Psychiatry.  Judgment and insight appears normal.    Data Reviewed: Prior data reviewed  Family Communication: Discussed with 2 sons and a daughter at bedside.  Disposition: Status is: Inpatient Remains inpatient appropriate because: Severity of illness  Planned Discharge Destination:  To be determined  Time spent: 55 minutes  This record has been created using Systems analyst. Errors have been sought and corrected,but may not always be located. Such creation errors do not reflect on the standard of care.   Author: Lorella Nimrod, MD 03/26/2022 3:19 PM  For on call review [email or web address].

## 2022-03-26 NOTE — Progress Notes (Signed)
    Palliative Medicine Team    Palliative Medicine consult noted. Due to high referral volume, there may be a delay in seeing this patient. Please call the Palliative Medicine Team office at [phone] if recommendations are needed in the interim. A provider will be available on site Monday, December 4. If patient is expected to discharge before this date, please make outpatient palliative care referral as appropriate.    Thank you for inviting [place] to participate in the care of this patient.   [place] Rosana Berger, DNP, FNP-BC Palliative Medicine Team

## 2022-03-26 NOTE — Progress Notes (Signed)
*  PRELIMINARY RESULTS* Echocardiogram 2D Echocardiogram has been performed.  Sherrie Sport 03/26/2022, 1:04 PM

## 2022-03-26 NOTE — Consult Note (Signed)
Vascular surgery was contacted by the attending Lorella Nimrod, MD in regards to Eric Hicks, a 88 year old gentleman that presented post fall.  He underwent a hemiarthroplasty on 03/24/2022 and developed respiratory distress and is currently on did high flow 45 L, FiO2 at 55% with a VQ scan that noted acute PE.  The patient also has known multiorgan dysfunction with acute kidney injury.  There is markedly elevated BNP.  Subsequent echo showed no evidence of right heart strain.  Currently the patient is on heparin infusion.  Given that the patient currently has multiorgan dysfunction with an acute kidney injury and no evidence of right heart strain, it is felt that proceeding with a pulmonary thrombectomy will likely do more harm than benefit to the patient.  Based on this we do not recommend any current intervention.  Recommend continuation with heparin and transition to oral anticoagulation when stable and appropriate.  Kris Hartmann, NP

## 2022-03-26 NOTE — Care Management Important Message (Signed)
Important Message  Patient Details  Name: Eric Hicks MRN: [id number] Date of Birth: Nov 08, 1926   Medicare Important Message Given:  Yes     Dannette Barbara 03/26/2022, 12:34 PM

## 2022-03-27 ENCOUNTER — Other Ambulatory Visit (INDEPENDENT_AMBULATORY_CARE_PROVIDER_SITE_OTHER): Payer: Self-pay | Admitting: Nurse Practitioner

## 2022-03-27 DIAGNOSIS — J9601 Acute respiratory failure with hypoxia: Secondary | ICD-10-CM | POA: Diagnosis not present

## 2022-03-27 LAB — BASIC METABOLIC PANEL
Anion gap: 5 (ref 5–15)
BUN: 49 mg/dL — ABNORMAL HIGH (ref 8–23)
CO2: 27 mmol/L (ref 22–32)
Calcium: 8.5 mg/dL — ABNORMAL LOW (ref 8.9–10.3)
Chloride: 109 mmol/L (ref 98–111)
Creatinine, Ser: 1.58 mg/dL — ABNORMAL HIGH (ref 0.61–1.24)
GFR, Estimated: 41 mL/min — ABNORMAL LOW (ref >60)
Glucose, Bld: 126 mg/dL — ABNORMAL HIGH (ref 70–99)
Potassium: 4.6 mmol/L (ref 3.5–5.1)
Sodium: 141 mmol/L (ref 135–145)

## 2022-03-27 LAB — CBC
HCT: 33 % — ABNORMAL LOW (ref 39.0–52.0)
Hemoglobin: 10.6 g/dL — ABNORMAL LOW (ref 13.0–17.0)
MCH: 32.1 pg (ref 26.0–34.0)
MCHC: 32.1 g/dL (ref 30.0–36.0)
MCV: 100 fL (ref 80.0–100.0)
Platelets: 108 10*3/uL — ABNORMAL LOW (ref 150–400)
RBC: 3.3 MIL/uL — ABNORMAL LOW (ref 4.22–5.81)
RDW: 14.3 % (ref 11.5–15.5)
WBC: 8.7 10*3/uL (ref 4.0–10.5)
nRBC: 0 % (ref 0.0–0.2)

## 2022-03-27 LAB — GLUCOSE, CAPILLARY: Glucose-Capillary: 116 mg/dL — ABNORMAL HIGH (ref 70–99)

## 2022-03-27 LAB — HEPARIN LEVEL (UNFRACTIONATED)
Heparin Unfractionated: 0.26 IU/mL — ABNORMAL LOW (ref 0.30–0.70)
Heparin Unfractionated: 0.29 IU/mL — ABNORMAL LOW (ref 0.30–0.70)

## 2022-03-27 MED ORDER — APIXABAN 5 MG PO TABS
10.0000 mg | ORAL_TABLET | Freq: Two times a day (BID) | ORAL | Status: DC
  Administered 2022-03-27 – 2022-03-30 (×6): 10 mg via ORAL
  Filled 2022-03-27 (×6): qty 2

## 2022-03-27 MED ORDER — HEPARIN BOLUS VIA INFUSION
1200.0000 [IU] | Freq: Once | INTRAVENOUS | Status: AC
  Administered 2022-03-27: 1200 [IU] via INTRAVENOUS
  Filled 2022-03-27: qty 1200

## 2022-03-27 MED ORDER — APIXABAN 5 MG PO TABS: 5.0000 mg | ORAL_TABLET | Freq: Two times a day (BID) | ORAL | Status: DC

## 2022-03-27 MED ORDER — FE FUM-VIT C-VIT B12-FA 460-60-0.01-1 MG PO CAPS
1.0000 | ORAL_CAPSULE | Freq: Every day | ORAL | Status: DC
  Administered 2022-03-27 – 2022-03-30 (×4): 1 via ORAL
  Filled 2022-03-27 (×4): qty 1

## 2022-03-27 NOTE — Progress Notes (Signed)
Progress Note   Patient: Eric Hicks HWE:[id number] DOB: October 24, 1926 DOA: 03/23/2022     4 DOS: the patient was seen and examined on 03/27/2022   Brief hospital course: Eric Hicks is a 88 year old male with history of hyperlipidemia, hypertension, impaired fasting glucose, history of whitecoat syndrome, who presents emergency department for chief concerns of mechanical fall and falling on his right hip and elbow, resulted and mildly impacted right femoral neck fracture  Patient denies loss of consciousness and hitting his head.  Initial vitals in the emergency department showed temperature of 97.6, respiration rate 18, heart rate 78, blood pressure 158/106, SpO2 of 96% on room air.  Labs in the ED showed serum sodium 142, potassium 4.4, bicarb 26, BUN 28, serum creatinine 1.31, GFR 51, nonfasting glucose 122. WBC 6.6, hemoglobin 13.1, platelets 144. CT head, CT cervical spine, CT chest and EKG elbow was without any acute abnormality. DG hip with mildly impacted right femoral neck fracture  ED treatment: Tdap booster has been ordered for injection.  Oxycodone 5 mg p.o. one-time dose.  11/29: Hemodynamically stable on 3 L oxygen, labs pertinent for leukocytosis, most likely reactive, hemoglobin decreased to 12.5 from 13.1, BMP with slight worsening of BUN 31 and creatinine 1.40 which remained around his baseline of 1.3-1.4. Going to the OR with orthopedic surgery today.  11/30: Patient underwent hemiarthroplasty with orthopedic surgery on 11/29.  Required transfer Amick acid twice for bleeding.  Also received 1 dose of IV Lasix in PACU. Blood pressure mildly elevated at 142/82 this morning.  Renal function with slight deterioration to 41/1.58, GFR decreased to 41 from 47. Anemia panel with normal iron and low normal folate, B12 pending. Patient also with new oxygen requirement-history of COPD-no wheezing. Encouraging incentive spirometry and flutter valve. Chest x-ray with stable central  vascular congestion, bilateral perihilar airspace disease left pleural effusion and BNP markedly elevated at 1960.  Patient was given IV Lasix and started on regular diuresis.  Also found to have elevated troponin, patient was started on heparin infusion and cardiology was consulted.  12/1: Patient overnight with worsening respiratory status, started on heated high flow with FiO2 of 80%, which is now weaned to 55% and 45L.  D-dimer was also found to be elevated at 2.51 which also makes PE possible.  VQ scan ordered for concern of PE, trying to avoid contrast with CTA due to AKI.  VQ scan with acute PE.   Worsening renal function today with BUN 50, creatinine 1.90.  Renal ultrasound with no hydronephrosis , some cortical thinning and no other significant abnormality. nephrology was also consulted. Holding a.m. dose of Lasix. Echocardiogram with improvement of EF to 50 to 89%, grade 1 diastolic dysfunction and no regional wall motion abnormalities.  No right heart strain mentioned. I will let cardiology decide about further diuresis.  Patient might get benefit from Lasix infusion for gentle diuresis. Also sending message to vascular surgery due to worsening respiratory status.  Patient with multiorgan failure, high risk for deterioration and mortality.  Discussed with his 2 sons and daughter at bedside.  They do have some legal papers which tells that he does not want extraordinary measures like feeding tube.  Not sure about DNR, he is currently full code.  Discussed with family to decide.  Also consulting palliative care.  12/2: Patient currently hemodynamically stable with improved oxygen requirement, able to wean to 6 L.  Improving renal function with GFR of 41 and creatinine of 1.58 today. He was not  a candidate for any surgical or vascular intervention as being evaluated by vascular surgery also.  Leukocytosis resolved.  Assessment and Plan: * Acute respiratory failure with hypoxia (HCC) Seems  improving, he was able to weaned down from 45 L to 6 L.  VQ scan with acute pulmonary embolism.  Elevated BNP and echocardiogram with improvement of his EF, no regional wall motion abnormalities and grade 1 diastolic dysfunction. -Switch heparin with Eliquis  Acute pulmonary embolism (HCC) VQ scan positive for acute pulmonary embolism.  Unable to do CTA due to AKI. Worsening respiratory status. Not a candidate for any surgical intervention. Aspiratory status improving -Switch him to Eliquis  Closed right hip fracture (HCC) Secondary to mechanical fall, s/p hemiarthroplasty on 10/29. -PT/OT evaluation -Continue with pain management-try avoiding opioids.  Stage 3a chronic kidney disease (CKD) ([place]) Patient developed AKI with CKD stage IIIa.   Creatinine started improving after holding Lasix. Renal ultrasound with cortical thickening and no hydronephrosis. Nephrology consult. -Monitor renal function -Avoid nephrotoxins  Nonischemic dilated cardiomyopathy ([place]) Repeat echocardiogram with improvement of EF to 50 to 55% with grade 1 diastolic dysfunction.  No regional wall motion abnormalities or right ventricular strain noted.  Troponin elevated most likely secondary to demand with acute pulmonary embolism and respiratory distress.  BNP at 1963. Patient did receive IV Lasix due to pulmonary vascular congestion and respiratory distress resulted in worsening of AKI. -Cardiology consult -Continue home carvedilol and statin -Holding further Lasix-will let nephrology and cardiology decide  White coat syndrome with diagnosis of hypertension Blood pressure mostly within goal. -Continue carvedilol and Cardura  BPH (benign prostatic hyperplasia) -Continue Cardura  Paroxysmal atrial fibrillation (HCC) Chronic atrial fibrillation and sees [facility] clinic cardiology - Per note on 02/11/2022, patient has declined anticoagulation therefore he is not on anticoagulation - Patient is on carvedilol  6.25 mg p.o. twice daily, this was resumed  Hyperlipidemia - Atorvastatin 20 mg daily resumed   Subjective: Patient seems improving, was able to wean to 6 L of oxygen.  Still mildly tachypneic with improved p.o. intake.  Physical Exam: Vitals:   03/27/22 0400 03/27/22 0407 03/27/22 0418 03/27/22 0955  BP: (!) 126/96   107/68  Pulse: 83 91 91 89  Resp: 19 18 20 20   Temp:  97.8 F (36.6 C)  97.7 F (36.5 C)  TempSrc:  Oral  Axillary  SpO2: 98% 96% 99% 95%  Weight:      Height:       General.  Frail elderly man, in no acute distress. Pulmonary.  Lungs clear bilaterally, normal respiratory effort. CV.  Regular rate and rhythm, no JVD, rub or murmur. Abdomen.  Soft, nontender, nondistended, BS positive. CNS.  Alert and oriented .  No focal neurologic deficit. Extremities.  No edema, no cyanosis, pulses intact and symmetrical. Psychiatry.  Judgment and insight appears normal.   Data Reviewed: Prior data reviewed  Family Communication: Discussed with son and daughter at bedside  Disposition: Status is: Inpatient Remains inpatient appropriate because: Severity of illness  Planned Discharge Destination:  To be determined  Time spent: 50 minutes  This record has been created using Systems analyst. Errors have been sought and corrected,but may not always be located. Such creation errors do not reflect on the standard of care.   Author: Lorella Nimrod, MD 03/27/2022 3:48 PM  For on call review [email or web address].

## 2022-03-27 NOTE — Progress Notes (Signed)
ANTICOAGULATION CONSULT NOTE  Pharmacy Consult for Heparin Infusion Indication: ACS/STEMI  Allergies  Allergen Reactions   Tylenol [Acetaminophen] Rash    Patient Measurements: Height: 5\' 10"  (177.8 cm) Weight: 77.1 kg (170 lb) IBW/kg (Calculated) : 73 Heparin Dosing Weight: 77.1 kg  Vital Signs: Temp: 97.8 F (36.6 C) (12/02 0407) Temp Source: Oral (12/02 0407) BP: 126/96 (12/02 0400) Pulse Rate: 91 (12/02 0418)  Labs: Recent Labs    03/25/22 1245 03/25/22 8099 03/25/22 1611 03/25/22 1930 03/26/22 0318 03/26/22 0745 03/26/22 0921 03/26/22 1156 03/26/22 2102 03/27/22 0410  HGB 11.6*  --   --   --  11.3*  --   --   --   --  10.6*  HCT 36.1*  --   --   --  34.6*  --   --   --   --  33.0*  PLT 107*  --   --   --  114*  --   --   --   --  108*  APTT  --   --   --  28  --   --   --   --   --   --   LABPROT  --   --   --  15.6*  --   --   --   --   --   --   INR  --   --   --  1.3*  --   --   --   --   --   --   HEPARINUNFRC  --    < >  --   --  0.36  --   --  0.22* 0.31 0.26*  CREATININE 1.58*  --   --   --  1.90*  --   --   --   --  1.58*  TROPONINIHS  --   --    < > 185*  --  141* 139*  --   --   --    < > = values in this interval not displayed.     Estimated Creatinine Clearance: 30.2 mL/min (A) (by C-G formula based on SCr of 1.58 mg/dL (H)).   Medical History: Past Medical History:  Diagnosis Date   BPH (benign prostatic hyperplasia)    managed by Olena Heckle   COPD (chronic obstructive pulmonary disease) (HCC)    Elevated PSA, between 10 and less than 20 ng/ml    Olena Heckle, watchful waiting   Hypertension    Osteoporosis    by DEXA    Medications:  Scheduled:   atorvastatin  20 mg Oral QODAY   calcium carbonate  1,250 mg Oral Q breakfast   carvedilol  6.25 mg Oral BID   cholecalciferol  1,000 Units Oral Daily   docusate sodium  100 mg Oral BID   dorzolamide-timolol  1 drop Both Eyes BID   doxazosin  1 mg Oral Daily   fluticasone furoate-vilanterol   1 puff Inhalation Daily   latanoprost  1 drop Both Eyes QHS   mupirocin ointment  1 Application Nasal BID   Infusions:   heparin 1,050 Units/hr (03/26/22 1533)   PRN: menthol-cetylpyridinium **OR** phenol, metoCLOPramide **OR** metoCLOPramide (REGLAN) injection, morphine injection, ondansetron **OR** ondansetron (ZOFRAN) IV, oxyCODONE, senna-docusate, traMADol  Assessment: Eric Hicks is a 88 y.o. male presenting with mechanical fall on 11/28. PMH significant for HTN, HLD, BPH, paroxysmal Afib (CHADSVASc 4), CKD3a, COPD. Patient was not on [facility] PTA per patient preference. Patient underwent hemiarthroplasty on 11/29.  On 11/30 rapid response was called for worsening breathing status & increased O2 requirements. hsTrop peaked at 219. Prior to heparin infusion, patient was on enoxaparin SQ for DVT prophylaxis. Last dose of enoxaparin 30 mg was 11/30 @ 0825. Pharmacy has been consulted to initiate and manage heparin infusion.   Baseline Labs: aPTT 28, PT 15.6, INR 1.3, Hgb 11.6, Hct 36.1, Plt 107  Goal of Therapy:  Heparin level 0.3-0.7 units/ml Monitor platelets by anticoagulation protocol: Yes  Date Time HL Rate/Comment  12/1 0318 0.36 900/therapeutic x1 12/1 1156 0.22 900/SUBtherapeutic 12/01 2102 0.31 Therapeutic x 1 12/02 0410 0.26 Subtherapeutic   Plan:  Bolus 1200 units x 1 Increase heparin infusion to 1200 units/hr Recheck HL in 8 hr after rate change CBC daily while on heparin  Renda Rolls, PharmD, [facility] 03/27/2022 5:26 AM

## 2022-03-27 NOTE — Progress Notes (Signed)
[facility] Cardiology    SUBJECTIVE: Resting comfortably in bed feels better than yesterday no chest pain improved shortness of breath requiring less respiratory support less oxygen denies palpitations or tachycardia no bleeding tolerating anticoagulation well   Vitals:   03/27/22 0400 03/27/22 0407 03/27/22 0418 03/27/22 0955  BP: (!) 126/96   107/68  Pulse: 83 91 91 89  Resp: 19 18 20 20   Temp:  97.8 F (36.6 C)  97.7 F (36.5 C)  TempSrc:  Oral  Axillary  SpO2: 98% 96% 99% 95%  Weight:      Height:         Intake/Output Summary (Last 24 hours) at 03/27/2022 1422 Last data filed at 03/27/2022 0410 Gross per 24 hour  Intake 161.62 ml  Output 750 ml  Net -588.38 ml      PHYSICAL EXAM  General: Well developed, well nourished, in no acute distress HEENT:  Normocephalic and atramatic Neck:  No JVD.  Lungs: Clear bilaterally to auscultation and percussion. Heart: HRRR . Normal S1 and S2 without gallops or murmurs.  Abdomen: Bowel sounds are positive, abdomen soft and non-tender  Msk:  Back normal, normal gait. Normal strength and tone for age. Extremities: No clubbing, cyanosis or edema.   Neuro: Alert and oriented X 3. Psych:  Good affect, responds appropriately   LABS: Basic Metabolic Panel: Recent Labs    03/26/22 0318 03/27/22 0410  NA 143 141  K 4.4 4.6  CL 110 109  CO2 26 27  GLUCOSE 140* 126*  BUN 50* 49*  CREATININE 1.90* 1.58*  CALCIUM 8.7* 8.5*   Liver Function Tests: No results for input(s): "AST", "ALT", "ALKPHOS", "BILITOT", "PROT", "ALBUMIN" in the last 72 hours. No results for input(s): "LIPASE", "AMYLASE" in the last 72 hours. CBC: Recent Labs    03/26/22 0318 03/27/22 0410  WBC 14.5* 8.7  HGB 11.3* 10.6*  HCT 34.6* 33.0*  MCV 100.0 100.0  PLT 114* 108*   Cardiac Enzymes: No results for input(s): "CKTOTAL", "CKMB", "CKMBINDEX", "TROPONINI" in the last 72 hours. BNP: Invalid input(s): "POCBNP" D-Dimer: Recent Labs    03/25/22 1932   DDIMER 2.51*   Hemoglobin A1C: No results for input(s): "HGBA1C" in the last 72 hours. Fasting Lipid Panel: No results for input(s): "CHOL", "HDL", "LDLCALC", "TRIG", "CHOLHDL", "LDLDIRECT" in the last 72 hours. Thyroid Function Tests: No results for input(s): "TSH", "T4TOTAL", "T3FREE", "THYROIDAB" in the last 72 hours.  Invalid input(s): "FREET3" Anemia Panel: Recent Labs    03/25/22 0634 03/25/22 1030  VITAMINB12  --  4,801*  FOLATE 6.6  --   FERRITIN 152  --   TIBC 251  --   IRON 49  --   RETICCTPCT 1.6  --     ECHOCARDIOGRAM COMPLETE  Result Date: 03/26/2022    ECHOCARDIOGRAM REPORT   Patient Name:   MARQUET FAIRCLOTH Errington Date of Exam: 03/26/2022 Medical Rec #:  [id number]    Height:       70.0 in Accession #:    [id number]   Weight:       170.0 lb Date of Birth:  1927-04-06    BSA:          1.948 m Patient Age:    84 years     BP:           126/75 mmHg Patient Gender: M            HR:           84 bpm. Exam Location:  [facility] Procedure: 2D Echo, Cardiac Doppler and Color Doppler Indications:     Abnormal ECG R94.31  History:         Patient has prior history of Echocardiogram examinations, most                  recent 06/29/2018. COPD; Risk Factors:Hypertension.  Sonographer:     Sherrie Sport Referring Phys:  [id number] [facility] AMIN Diagnosing Phys: Yolonda Kida MD  Sonographer Comments: Image quality was good. IMPRESSIONS  1. Left ventricular ejection fraction, by estimation, is 55 to 60%. The left ventricle has normal function. The left ventricle has no regional wall motion abnormalities. There is mild concentric left ventricular hypertrophy. Left ventricular diastolic parameters are consistent with Grade I diastolic dysfunction (impaired relaxation).  2. Right ventricular systolic function is normal. The right ventricular size is normal.  3. Left atrial size was mildly dilated.  4. Right atrial size was mildly dilated.  5. The mitral valve is grossly normal. Mild mitral valve regurgitation.  6.  The aortic valve is calcified. Aortic valve regurgitation is mild to moderate. FINDINGS  Left Ventricle: Left ventricular ejection fraction, by estimation, is 55 to 60%. The left ventricle has normal function. The left ventricle has no regional wall motion abnormalities. The left ventricular internal cavity size was normal in size. There is  mild concentric left ventricular hypertrophy. Left ventricular diastolic parameters are consistent with Grade I diastolic dysfunction (impaired relaxation). Right Ventricle: The right ventricular size is normal. No increase in right ventricular wall thickness. Right ventricular systolic function is normal. Left Atrium: Left atrial size was mildly dilated. Right Atrium: Right atrial size was mildly dilated. Pericardium: There is no evidence of pericardial effusion. Mitral Valve: The mitral valve is grossly normal. There is mild thickening of the mitral valve leaflet(s). There is mild calcification of the mitral valve leaflet(s). Mildly decreased mobility of the mitral valve leaflets. Mild mitral annular calcification. Mild mitral valve regurgitation. Tricuspid Valve: The tricuspid valve is normal in structure. Tricuspid valve regurgitation is trivial. Aortic Valve: The aortic valve is calcified. Aortic valve regurgitation is mild to moderate. Aortic regurgitation PHT measures 396 msec. Aortic valve mean gradient measures 3.0 mmHg. Aortic valve peak gradient measures 5.5 mmHg. Aortic valve area, by VTI  measures 3.22 cm. Pulmonic Valve: The pulmonic valve was normal in structure. Pulmonic valve regurgitation is not visualized. Aorta: The ascending aorta was not well visualized. IAS/Shunts: No atrial level shunt detected by color flow Doppler.  LEFT VENTRICLE PLAX 2D LVOT diam:     2.20 cm LV SV:         55 LV SV Index:   28 LVOT Area:     3.80 cm  RIGHT VENTRICLE RV Basal diam:  4.60 cm RV Mid diam:    3.90 cm RV S prime:     11.40 cm/s TAPSE (M-mode): 1.2 cm LEFT ATRIUM              Index        RIGHT ATRIUM           Index LA diam:        4.10 cm 2.10 cm/m   RA Area:     28.00 cm LA Vol (A2C):   64.8 ml 33.26 ml/m  RA Volume:   83.20 ml  42.71 ml/m LA Vol (A4C):   48.5 ml 24.90 ml/m LA Biplane Vol: 57.0 ml 29.26 ml/m  AORTIC VALVE AV Area (Vmax):    2.98 cm AV  Area (Vmean):   2.85 cm AV Area (VTI):     3.22 cm AV Vmax:           117.00 cm/s AV Vmean:          81.500 cm/s AV VTI:            0.171 m AV Peak Grad:      5.5 mmHg AV Mean Grad:      3.0 mmHg LVOT Vmax:         91.80 cm/s LVOT Vmean:        61.000 cm/s LVOT VTI:          0.145 m LVOT/AV VTI ratio: 0.85 AI PHT:            396 msec  AORTA Ao Root diam: 3.90 cm MITRAL VALVE               TRICUSPID VALVE MV Area (PHT): 4.99 cm    TR Peak grad:   28.1 mmHg MV Decel Time: 152 msec    TR Vmax:        265.00 cm/s MV E velocity: 81.80 cm/s                            SHUNTS                            Systemic VTI:  0.14 m                            Systemic Diam: 2.20 cm Yolonda Kida MD Electronically signed by Yolonda Kida MD Signature Date/Time: 03/26/2022/1:43:34 PM    Final    US RENAL  Result Date: 03/26/2022 CLINICAL DATA:  Acute kidney injury EXAM: RENAL / URINARY TRACT ULTRASOUND COMPLETE COMPARISON:  None available FINDINGS: Right Kidney: Renal measurements: 9.3 x 5.1 x 4.6 cm = volume: 116 mL. Mild diffuse cortical thinning. No mass or hydronephrosis visualized. 9 mm nonobstructing calculus present in the upper pole. Left Kidney: Renal measurements: 10.7 x 5.8 x 3.9 cm = volume: 127 mL. Mild diffuse cortical thinning. No mass or hydronephrosis visualized. 1.6 cm simple cyst in the upper pole does not require further imaging follow-up. Bladder: Unable to evaluate due to collapse configuration. Other: None. IMPRESSION: 1. No hydronephrosis. 2. Mild diffuse cortical thinning of the kidneys consistent with chronic renal failure. 3. 9 mm nonobstructing calculus in the upper pole of the right kidney.  Electronically Signed   By: Miachel Roux M.D.   On: 03/26/2022 11:31   NM Pulmonary Perfusion  Result Date: 03/26/2022 CLINICAL DATA:  Concern for pulmonary embolism. Respiratory distress. Worsening respiratory distress. Rapid breathing EXAM: NUCLEAR MEDICINE PERFUSION LUNG SCAN TECHNIQUE: Perfusion images were obtained in multiple projections after intravenous injection of radiopharmaceutical. RADIOPHARMACEUTICALS:  4.5 mCi Tc-21m MAA COMPARISON:  Chest radiograph 03/25/2022, CT 01/12/2022 insert FINDINGS: Wedge-shaped peripheral perfusion defect within LEFT upper lobe is not matched on comparison chest radiograph. Small perfusion defect within the lateral aspect of the RIGHT upper lobe is also unmatched. More central RIGHT upper lobe perfusion defect corresponds to mass on comparison radiograph. regional decreased perfusion to the LEFT lower lobe. IMPRESSION: Unmatched wedge-shaped peripheral perfusion defects in the upper lobes are most consistent acute pulmonary emboli. These results will be called to the ordering clinician or representative by the Radiologist Assistant, and communication documented in  the PACS or [employer]. Electronically Signed   By: Suzy Bouchard M.D.   On: 03/26/2022 11:26   DG Chest Port 1 View  Result Date: 03/25/2022 CLINICAL DATA:  Hypoxia, history of right upper lobe lung cancer EXAM: PORTABLE CHEST 1 VIEW COMPARISON:  03/24/2022, 01/12/2022 FINDINGS: Single frontal view of the chest demonstrates persistent enlargement of the cardiac silhouette. No change in the central vascular congestion, bilateral perihilar airspace disease, and left pleural effusion. Stable right upper lobe consolidation consistent with history of lung cancer and radiation therapy. No acute bony abnormalities. IMPRESSION: 1. Stable congestive heart failure. 2. Persistent right upper lobe consolidation and scarring, consistent with known history of lung cancer and prior radiation therapy.  Electronically Signed   By: Randa Ngo M.D.   On: 03/25/2022 16:10     Echo preserved left ventricular function EF around 55-60%  TELEMETRY: Atrial fibrillation rate of 100 nonspecific ST-T wave changes:  ASSESSMENT AND PLAN:  Principal Problem:   Acute respiratory failure with hypoxia (HCC) Active Problems:   BPH (benign prostatic hyperplasia)   White coat syndrome with diagnosis of hypertension   Nonischemic dilated cardiomyopathy (HCC)   Paroxysmal atrial fibrillation (HCC)   Closed right hip fracture (HCC)   Stage 3a chronic kidney disease (CKD) ([place])   Hyperlipidemia   Acute pulmonary embolism ([place])    Plan Patient resting comfortably requiring less supplemental oxygen denies any dyspnea or pain Atrial fibrillation reasonably controlled rate management patient has refused long-term anticoagulation Acute DVT PE currently on anticoagulation hopefully will be able to maintain for 3 to 6 months Renal insufficiency stage III continue conservative management Hyperlipidemia continue statin therapy for lipid management and control Diuretic therapy for chronic diastolic congestive heart failure .  Hip surgery patient is to be stable.  With a complication of a pulmonary embolus but appears to be recovering well Continue physical therapy rehab No invasive cardiac procedures recommended at this point   Yolonda Kida, MD 03/27/2022 2:22 PM

## 2022-03-27 NOTE — Progress Notes (Signed)
PT Cancellation Note  Patient Details Name: Eric Hicks MRN: [id number] DOB: December 03, 1926   Cancelled Treatment:    Reason Eval/Treat Not Completed: Other (comment).  Chart reviewed.  Pt with rapid response 11/30 and transferred to PCU; pt then noted to be (+) PE 03/26/22.  No new PT orders or continue upon transfer PT orders received so PT will sign off at this time (message sent to [facility] regarding needing new therapy order when medically appropriate).  Leitha Bleak, PT 03/27/22, 1:35 PM

## 2022-03-27 NOTE — Progress Notes (Signed)
ANTICOAGULATION CONSULT NOTE - Initial Consult  Pharmacy Consult for apixaban  Indication: pulmonary embolus  Allergies  Allergen Reactions   Tylenol [Acetaminophen] Rash    Patient Measurements: Height: 5\' 10"  (177.8 cm) Weight: 77.1 kg (170 lb) IBW/kg (Calculated) : 73   Vital Signs: Temp: 98 F (36.7 C) (12/02 1600) Temp Source: Oral (12/02 1600) BP: 111/68 (12/02 1600) Pulse Rate: 96 (12/02 1600)  Labs: Recent Labs    03/25/22 0634 03/25/22 1611 03/25/22 1930 03/26/22 0318 03/26/22 0745 03/26/22 0921 03/26/22 1156 03/26/22 2102 03/27/22 0410 03/27/22 1335  HGB 11.6*  --   --  11.3*  --   --   --   --  10.6*  --   HCT 36.1*  --   --  34.6*  --   --   --   --  33.0*  --   PLT 107*  --   --  114*  --   --   --   --  108*  --   APTT  --   --  28  --   --   --   --   --   --   --   LABPROT  --   --  15.6*  --   --   --   --   --   --   --   INR  --   --  1.3*  --   --   --   --   --   --   --   HEPARINUNFRC  --   --   --  0.36  --   --    < > 0.31 0.26* 0.29*  CREATININE 1.58*  --   --  1.90*  --   --   --   --  1.58*  --   TROPONINIHS  --    < > 185*  --  141* 139*  --   --   --   --    < > = values in this interval not displayed.    Estimated Creatinine Clearance: 30.2 mL/min (A) (by C-G formula based on SCr of 1.58 mg/dL (H)).   Medical History: Past Medical History:  Diagnosis Date   BPH (benign prostatic hyperplasia)    managed by Olena Heckle   COPD (chronic obstructive pulmonary disease) (HCC)    Elevated PSA, between 10 and less than 20 ng/ml    Olena Heckle, watchful waiting   Hypertension    Osteoporosis    by DEXA    Medications:  No PTA AC meds On heparin gtt 11/30>>12/02  Assessment: Eric Hicks is a 88 y.o. male presenting with mechanical fall on 11/28. PMH significant for HTN, HLD, BPH, paroxysmal Afib (CHADSVASc 4), CKD3a, COPD. Patient was not on [facility] PTA per patient preference. Patient underwent hemiarthroplasty on 11/29. On 11/30 rapid  response was called for worsening breathing status & increased O2 requirements. hsTrop peaked at 219. Pharmacy consulted for transition from heparin gtt to apixaban.  Goal of Therapy:   Monitor platelets by anticoagulation protocol: Yes   Plan:  Apixaban 10 mg po bid x 7 days, then 5 mg po bid Stop heparin gtt when first dose apixaban given  Alison Murray 03/27/2022,4:23 PM

## 2022-03-27 NOTE — Progress Notes (Signed)
[facility]  ROUNDING NOTE   Subjective:   Patient seen sitting up in bed, family at bedside Currently receiving assistance with breakfast tray Remains on 6 L nasal cannula No lower extremity edema Patient states he feels better today than yesterday.  Creatinine 1.58 from 1.90.   Urine output 875 mL in preceding 24 hours  Objective:  Vital signs in last 24 hours:  Temp:  [97.7 F (36.5 C)-98.6 F (37 C)] 97.7 F (36.5 C) (12/02 0955) Pulse Rate:  [73-94] 89 (12/02 0955) Resp:  [16-20] 20 (12/02 0955) BP: (107-126)/(68-96) 107/68 (12/02 0955) SpO2:  [95 %-99 %] 95 % (12/02 0955)  Weight change:  Filed Weights   03/23/22 2029  Weight: 77.1 kg    Intake/Output: I/O last 3 completed shifts: In: 582.2 [P.O.:330; I.V.:252.2] Out: 875 [Urine:875]   Intake/Output this shift:  No intake/output data recorded.  Physical Exam: General: NAD  Head: Normocephalic, atraumatic. Moist oral mucosal membranes  Eyes: Anicteric  Lungs:  Scattered wheeze, normal effort, [place] O2  Heart: Regular rate and rhythm  Abdomen:  Soft, nontender, nondistended  Extremities: No peripheral edema.  Neurologic: Alert and oriented  Skin: No lesions  Access: None    Basic Metabolic Panel: Recent Labs  Lab 03/23/22 2058 03/24/22 0323 03/25/22 0634 03/26/22 0318 03/27/22 0410  NA 142 139 142 143 141  K 4.4 4.3 4.6 4.4 4.6  CL 111 112* 111 110 109  CO2 26 23 25 26 27   GLUCOSE 122* 141* 150* 140* 126*  BUN 28* 31* 41* 50* 49*  CREATININE 1.31* 1.40* 1.58* 1.90* 1.58*  CALCIUM 9.1 9.0 8.4* 8.7* 8.5*    Liver Function Tests: Recent Labs  Lab 03/23/22 2058  AST 25  ALT 16  ALKPHOS 68  BILITOT 1.0  PROT 6.1*  ALBUMIN 3.4*   No results for input(s): "LIPASE", "AMYLASE" in the last 168 hours. No results for input(s): "AMMONIA" in the last 168 hours.  CBC: Recent Labs  Lab 03/23/22 2058 03/24/22 0323 03/25/22 0634 03/26/22 0318 03/27/22 0410  WBC 6.6 14.9* 10.2  14.5* 8.7  NEUTROABS 5.2  --   --   --   --   HGB 13.1 12.5* 11.6* 11.3* 10.6*  HCT 39.5 38.2* 36.1* 34.6* 33.0*  MCV 99.5 99.0 100.3* 100.0 100.0  PLT 144* 147* 107* 114* 108*    Cardiac Enzymes: No results for input(s): "CKTOTAL", "CKMB", "CKMBINDEX", "TROPONINI" in the last 168 hours.  BNP: Invalid input(s): "POCBNP"  CBG: Recent Labs  Lab 03/24/22 2030 03/25/22 1642  GLUCAP 146* 197*    Microbiology: Results for orders placed or performed during the hospital encounter of 03/23/22  Surgical PCR screen     Status: None   Collection Time: 03/24/22  5:50 AM   Specimen: Nasal Mucosa; Nasal Swab  Result Value Ref Range Status   MRSA, PCR NEGATIVE NEGATIVE Final   Staphylococcus aureus NEGATIVE NEGATIVE Final    Comment: (NOTE) The Xpert SA Assay (FDA approved for NASAL specimens in patients 79 years of age and older), is one component of a comprehensive surveillance program. It is not intended to diagnose infection nor to guide or monitor treatment. Performed at [facility], [address] [place], [place] [address]     Coagulation Studies: Recent Labs    03/25/22 1930  LABPROT 15.6*  INR 1.3*    Urinalysis: No results for input(s): "COLORURINE", "LABSPEC", "PHURINE", "GLUCOSEU", "HGBUR", "BILIRUBINUR", "KETONESUR", "PROTEINUR", "UROBILINOGEN", "NITRITE", "LEUKOCYTESUR" in the last 72 hours.  Invalid input(s): "  APPERANCEUR"    Imaging: ECHOCARDIOGRAM COMPLETE  Result Date: 03/26/2022    ECHOCARDIOGRAM REPORT   Patient Name:   Eric Hicks Date of Exam: 03/26/2022 Medical Rec #:  [id number]    Height:       70.0 in Accession #:    [id number]   Weight:       170.0 lb Date of Birth:  1926/06/25    BSA:          1.948 m Patient Age:    88 years     BP:           126/75 mmHg Patient Gender: M            HR:           84 bpm. Exam Location:  [facility] Procedure: 2D Echo, Cardiac Doppler and Color Doppler Indications:     Abnormal ECG R94.31  History:          Patient has prior history of Echocardiogram examinations, most                  recent 06/29/2018. COPD; Risk Factors:Hypertension.  Sonographer:     Sherrie Sport Referring Phys:  [id number] [facility] AMIN Diagnosing Phys: Yolonda Kida MD  Sonographer Comments: Image quality was good. IMPRESSIONS  1. Left ventricular ejection fraction, by estimation, is 55 to 60%. The left ventricle has normal function. The left ventricle has no regional wall motion abnormalities. There is mild concentric left ventricular hypertrophy. Left ventricular diastolic parameters are consistent with Grade I diastolic dysfunction (impaired relaxation).  2. Right ventricular systolic function is normal. The right ventricular size is normal.  3. Left atrial size was mildly dilated.  4. Right atrial size was mildly dilated.  5. The mitral valve is grossly normal. Mild mitral valve regurgitation.  6. The aortic valve is calcified. Aortic valve regurgitation is mild to moderate. FINDINGS  Left Ventricle: Left ventricular ejection fraction, by estimation, is 55 to 60%. The left ventricle has normal function. The left ventricle has no regional wall motion abnormalities. The left ventricular internal cavity size was normal in size. There is  mild concentric left ventricular hypertrophy. Left ventricular diastolic parameters are consistent with Grade I diastolic dysfunction (impaired relaxation). Right Ventricle: The right ventricular size is normal. No increase in right ventricular wall thickness. Right ventricular systolic function is normal. Left Atrium: Left atrial size was mildly dilated. Right Atrium: Right atrial size was mildly dilated. Pericardium: There is no evidence of pericardial effusion. Mitral Valve: The mitral valve is grossly normal. There is mild thickening of the mitral valve leaflet(s). There is mild calcification of the mitral valve leaflet(s). Mildly decreased mobility of the mitral valve leaflets. Mild mitral annular  calcification. Mild mitral valve regurgitation. Tricuspid Valve: The tricuspid valve is normal in structure. Tricuspid valve regurgitation is trivial. Aortic Valve: The aortic valve is calcified. Aortic valve regurgitation is mild to moderate. Aortic regurgitation PHT measures 396 msec. Aortic valve mean gradient measures 3.0 mmHg. Aortic valve peak gradient measures 5.5 mmHg. Aortic valve area, by VTI  measures 3.22 cm. Pulmonic Valve: The pulmonic valve was normal in structure. Pulmonic valve regurgitation is not visualized. Aorta: The ascending aorta was not well visualized. IAS/Shunts: No atrial level shunt detected by color flow Doppler.  LEFT VENTRICLE PLAX 2D LVOT diam:     2.20 cm LV SV:         55 LV SV Index:   28 LVOT Area:  3.80 cm  RIGHT VENTRICLE RV Basal diam:  4.60 cm RV Mid diam:    3.90 cm RV S prime:     11.40 cm/s TAPSE (M-mode): 1.2 cm LEFT ATRIUM             Index        RIGHT ATRIUM           Index LA diam:        4.10 cm 2.10 cm/m   RA Area:     28.00 cm LA Vol (A2C):   64.8 ml 33.26 ml/m  RA Volume:   83.20 ml  42.71 ml/m LA Vol (A4C):   48.5 ml 24.90 ml/m LA Biplane Vol: 57.0 ml 29.26 ml/m  AORTIC VALVE AV Area (Vmax):    2.98 cm AV Area (Vmean):   2.85 cm AV Area (VTI):     3.22 cm AV Vmax:           117.00 cm/s AV Vmean:          81.500 cm/s AV VTI:            0.171 m AV Peak Grad:      5.5 mmHg AV Mean Grad:      3.0 mmHg LVOT Vmax:         91.80 cm/s LVOT Vmean:        61.000 cm/s LVOT VTI:          0.145 m LVOT/AV VTI ratio: 0.85 AI PHT:            396 msec  AORTA Ao Root diam: 3.90 cm MITRAL VALVE               TRICUSPID VALVE MV Area (PHT): 4.99 cm    TR Peak grad:   28.1 mmHg MV Decel Time: 152 msec    TR Vmax:        265.00 cm/s MV E velocity: 81.80 cm/s                            SHUNTS                            Systemic VTI:  0.14 m                            Systemic Diam: 2.20 cm Yolonda Kida MD Electronically signed by Yolonda Kida MD Signature  Date/Time: 03/26/2022/1:43:34 PM    Final    US RENAL  Result Date: 03/26/2022 CLINICAL DATA:  Acute kidney injury EXAM: RENAL / URINARY TRACT ULTRASOUND COMPLETE COMPARISON:  None available FINDINGS: Right Kidney: Renal measurements: 9.3 x 5.1 x 4.6 cm = volume: 116 mL. Mild diffuse cortical thinning. No mass or hydronephrosis visualized. 9 mm nonobstructing calculus present in the upper pole. Left Kidney: Renal measurements: 10.7 x 5.8 x 3.9 cm = volume: 127 mL. Mild diffuse cortical thinning. No mass or hydronephrosis visualized. 1.6 cm simple cyst in the upper pole does not require further imaging follow-up. Bladder: Unable to evaluate due to collapse configuration. Other: None. IMPRESSION: 1. No hydronephrosis. 2. Mild diffuse cortical thinning of the kidneys consistent with chronic renal failure. 3. 9 mm nonobstructing calculus in the upper pole of the right kidney. Electronically Signed   By: Miachel Roux M.D.   On: 03/26/2022 11:31   NM Pulmonary Perfusion  Result Date: 03/26/2022 CLINICAL DATA:  Concern for pulmonary embolism. Respiratory distress. Worsening respiratory distress. Rapid breathing EXAM: NUCLEAR MEDICINE PERFUSION LUNG SCAN TECHNIQUE: Perfusion images were obtained in multiple projections after intravenous injection of radiopharmaceutical. RADIOPHARMACEUTICALS:  4.5 mCi Tc-59m MAA COMPARISON:  Chest radiograph 03/25/2022, CT 01/12/2022 insert FINDINGS: Wedge-shaped peripheral perfusion defect within LEFT upper lobe is not matched on comparison chest radiograph. Small perfusion defect within the lateral aspect of the RIGHT upper lobe is also unmatched. More central RIGHT upper lobe perfusion defect corresponds to mass on comparison radiograph. regional decreased perfusion to the LEFT lower lobe. IMPRESSION: Unmatched wedge-shaped peripheral perfusion defects in the upper lobes are most consistent acute pulmonary emboli. These results will be called to the ordering clinician or  representative by the Radiologist Assistant, and communication documented in the PACS or [employer]. Electronically Signed   By: Suzy Bouchard M.D.   On: 03/26/2022 11:26   DG Chest Port 1 View  Result Date: 03/25/2022 CLINICAL DATA:  Hypoxia, history of right upper lobe lung cancer EXAM: PORTABLE CHEST 1 VIEW COMPARISON:  03/24/2022, 01/12/2022 FINDINGS: Single frontal view of the chest demonstrates persistent enlargement of the cardiac silhouette. No change in the central vascular congestion, bilateral perihilar airspace disease, and left pleural effusion. Stable right upper lobe consolidation consistent with history of lung cancer and radiation therapy. No acute bony abnormalities. IMPRESSION: 1. Stable congestive heart failure. 2. Persistent right upper lobe consolidation and scarring, consistent with known history of lung cancer and prior radiation therapy. Electronically Signed   By: Randa Ngo M.D.   On: 03/25/2022 16:10     Medications:    heparin 1,200 Units/hr (03/27/22 0532)    atorvastatin  20 mg Oral QODAY   calcium carbonate  1,250 mg Oral Q breakfast   carvedilol  6.25 mg Oral BID   cholecalciferol  1,000 Units Oral Daily   docusate sodium  100 mg Oral BID   dorzolamide-timolol  1 drop Both Eyes BID   doxazosin  1 mg Oral Daily   Fe Fum-Vit C-Vit B12-FA  1 capsule Oral QPC breakfast   fluticasone furoate-vilanterol  1 puff Inhalation Daily   latanoprost  1 drop Both Eyes QHS   mupirocin ointment  1 Application Nasal BID   menthol-cetylpyridinium **OR** phenol, metoCLOPramide **OR** metoCLOPramide (REGLAN) injection, morphine injection, ondansetron **OR** ondansetron (ZOFRAN) IV, oxyCODONE, senna-docusate, traMADol  Assessment/ Plan:  Eric Hicks is a 88 y.o.  male with past medical history of hypertension, hyperlipidemia, and white coat syndrome, who was admitted to [facility] on 03/23/2022 for Closed right hip fracture ([place]) [S72.001A] Closed fracture of right  hip, initial encounter ([place]) [S72.001A]   Acute kidney injury on chronic kidney disease stage IIIa. Baseline creatinine appears to be 1.34 on 03/08/22. Acute kidney injury likely secondary to cardiorenal syndrome. Renal ultrasound shows 18mm non-obstructing calculus at the right upper pole.   Creatinine improved with holding diuresis yesterday. Urine output slowly improving also. Will continue to monitor renal function.   Lab Results  Component Value Date   CREATININE 1.58 (H) 03/27/2022   CREATININE 1.90 (H) 03/26/2022   CREATININE 1.58 (H) 03/25/2022    Intake/Output Summary (Last 24 hours) at 03/27/2022 1212 Last data filed at 03/27/2022 0410 Gross per 24 hour  Intake 328.17 ml  Output 750 ml  Net -421.83 ml   2. Acute on chronic diastolic heart failure. ECHO shows EF 55-60% with a Grade 1 diastolic dysfunction.    3. Acute respiratory failure with hypoxia,  VQ scan positive for pulmonary embolism. Continue heparin drip and weaning oxygen as tolerated.    4. Hypertension with chronic kidney disease. Home regimen includes carvedilol,  doxazosin and furosemide.   5. Secondary Hyperparathyroidism: PTH 65.2 on 07/18/20, phosphorus 3.3, calcium 8.1 on 08/14/21  Lab Results  Component Value Date   PTH 30 03/30/2017   CALCIUM 8.5 (L) 03/27/2022    Patient prescribed calcium carbonate and cholecalciferol outpatient.   6. Anemia of chronic kidney disease Lab Results  Component Value Date   HGB 10.6 (L) 03/27/2022   Hgb at goal    LOS: [place] 12/2/202312:12 PM

## 2022-03-27 NOTE — Progress Notes (Signed)
   Subjective: 3 Days Post-Op Procedure(s) (LRB): ARTHROPLASTY BIPOLAR HIP (HEMIARTHROPLASTY) (Right) Patient reports right hip pain as mild. Breathing/SOB improving. Son at bedside, patient more alert/talkative today. No complaints of chest pain/SOB. Tolerating po well. Plan is to go Skilled nursing facility after hospital stay.  Objective: Vital signs in last 24 hours: Temp:  [97.7 F (36.5 C)-98.6 F (37 C)] 97.7 F (36.5 C) (12/02 0955) Pulse Rate:  [73-94] 89 (12/02 0955) Resp:  [16-20] 20 (12/02 0955) BP: (107-126)/(68-96) 107/68 (12/02 0955) SpO2:  [95 %-99 %] 95 % (12/02 0955)  Intake/Output from previous day: 12/01 0701 - 12/02 0700 In: 463.2 [P.O.:330; I.V.:133.2] Out: 875 [Urine:875] Intake/Output this shift: No intake/output data recorded.  Recent Labs    03/25/22 0634 03/26/22 0318 03/27/22 0410  HGB 11.6* 11.3* 10.6*   Recent Labs    03/26/22 0318 03/27/22 0410  WBC 14.5* 8.7  RBC 3.46* 3.30*  HCT 34.6* 33.0*  PLT 114* 108*   Recent Labs    03/26/22 0318 03/27/22 0410  NA 143 141  K 4.4 4.6  CL 110 109  CO2 26 27  BUN 50* 49*  CREATININE 1.90* 1.58*  GLUCOSE 140* 126*  CALCIUM 8.7* 8.5*   Recent Labs    03/25/22 1930  INR 1.3*    EXAM General - Patient is Alert, Appropriate, and Oriented. No distress. Extremity - Neurovascular intact Sensation intact distally Intact pulses distally Dorsiflexion/Plantar flexion intact No swelling in the lower extremities.  Negative Homans' sign bilaterally. Dressing - dressing C/D/I and no drainage Motor Function - intact, moving foot and toes well on exam.   Past Medical History:  Diagnosis Date   BPH (benign prostatic hyperplasia)    managed by Olena Heckle   COPD (chronic obstructive pulmonary disease) ([place])    Elevated PSA, between 10 and less than 20 ng/ml    Olena Heckle, watchful waiting   Hypertension    Osteoporosis    by DEXA    Assessment/Plan:   3 Days Post-Op Procedure(s)  (LRB): ARTHROPLASTY BIPOLAR HIP (HEMIARTHROPLASTY) (Right) Principal Problem:   Acute respiratory failure with hypoxia (HCC) Active Problems:   BPH (benign prostatic hyperplasia)   White coat syndrome with diagnosis of hypertension   Nonischemic dilated cardiomyopathy (HCC)   Paroxysmal atrial fibrillation (HCC)   Closed right hip fracture (HCC)   Stage 3a chronic kidney disease (CKD) ([place])   Hyperlipidemia   Acute pulmonary embolism ([place])  Estimated body mass index is 24.39 kg/m as calculated from the following:   Height as of this encounter: 5\' 10"  (1.778 m).   Weight as of this encounter: 77.1 kg. Advance diet Up with therapy Work on BM RIght hip pain well controlled Vital signs are stable Labs are stable, hemoglobin  10.6 Pulmonary Embolism - on Heparin. Ok to continue with PT.  CM to assist with discharge to SNF  Patient will need 2 week follow up with [facility] ortho    DVT Prophylaxis - Heparin Weight-Bearing as tolerated to right leg   T. Rachelle Hora, PA-C [place] 03/27/2022, 12:17 PM

## 2022-03-27 NOTE — Progress Notes (Signed)
ANTICOAGULATION CONSULT NOTE  Pharmacy Consult for Heparin Infusion Indication: ACS/STEMI  Allergies  Allergen Reactions   Tylenol [Acetaminophen] Rash    Patient Measurements: Height: 5\' 10"  (177.8 cm) Weight: 77.1 kg (170 lb) IBW/kg (Calculated) : 73 Heparin Dosing Weight: 77.1 kg  Vital Signs: Temp: 97.7 F (36.5 C) (12/02 0955) Temp Source: Axillary (12/02 0955) BP: 107/68 (12/02 0955) Pulse Rate: 89 (12/02 0955)  Labs: Recent Labs    03/25/22 0634 03/25/22 1611 03/25/22 1930 03/26/22 0318 03/26/22 0745 03/26/22 0921 03/26/22 1156 03/26/22 2102 03/27/22 0410 03/27/22 1335  HGB 11.6*  --   --  11.3*  --   --   --   --  10.6*  --   HCT 36.1*  --   --  34.6*  --   --   --   --  33.0*  --   PLT 107*  --   --  114*  --   --   --   --  108*  --   APTT  --   --  28  --   --   --   --   --   --   --   LABPROT  --   --  15.6*  --   --   --   --   --   --   --   INR  --   --  1.3*  --   --   --   --   --   --   --   HEPARINUNFRC  --   --   --  0.36  --   --    < > 0.31 0.26* 0.29*  CREATININE 1.58*  --   --  1.90*  --   --   --   --  1.58*  --   TROPONINIHS  --    < > 185*  --  141* 139*  --   --   --   --    < > = values in this interval not displayed.     Estimated Creatinine Clearance: 30.2 mL/min (A) (by C-G formula based on SCr of 1.58 mg/dL (H)).   Medical History: Past Medical History:  Diagnosis Date   BPH (benign prostatic hyperplasia)    managed by Olena Heckle   COPD (chronic obstructive pulmonary disease) (HCC)    Elevated PSA, between 10 and less than 20 ng/ml    Olena Heckle, watchful waiting   Hypertension    Osteoporosis    by DEXA    Medications:  Scheduled:   atorvastatin  20 mg Oral QODAY   calcium carbonate  1,250 mg Oral Q breakfast   carvedilol  6.25 mg Oral BID   cholecalciferol  1,000 Units Oral Daily   docusate sodium  100 mg Oral BID   dorzolamide-timolol  1 drop Both Eyes BID   doxazosin  1 mg Oral Daily   Fe Fum-Vit C-Vit B12-FA  1  capsule Oral QPC breakfast   fluticasone furoate-vilanterol  1 puff Inhalation Daily   latanoprost  1 drop Both Eyes QHS   mupirocin ointment  1 Application Nasal BID   Infusions:   heparin 1,200 Units/hr (03/27/22 1413)   PRN: menthol-cetylpyridinium **OR** phenol, metoCLOPramide **OR** metoCLOPramide (REGLAN) injection, morphine injection, ondansetron **OR** ondansetron (ZOFRAN) IV, oxyCODONE, senna-docusate, traMADol  Assessment: Eric Hicks is a 88 y.o. male presenting with mechanical fall on 11/28. PMH significant for HTN, HLD, BPH, paroxysmal Afib (CHADSVASc 4), CKD3a, COPD. Patient was  not on [facility] PTA per patient preference. Patient underwent hemiarthroplasty on 11/29. On 11/30 rapid response was called for worsening breathing status & increased O2 requirements. hsTrop peaked at 219. Prior to heparin infusion, patient was on enoxaparin SQ for DVT prophylaxis. Last dose of enoxaparin 30 mg was 11/30 @ 0825. Pharmacy has been consulted to initiate and manage heparin infusion.   Baseline Labs: aPTT 28, PT 15.6, INR 1.3, Hgb 11.6, Hct 36.1, Plt 107  Goal of Therapy:  Heparin level 0.3-0.7 units/ml Monitor platelets by anticoagulation protocol: Yes  Date Time HL Rate/Comment  12/1 0318 0.36 900/therapeutic x1 12/1 1156 0.22 900/SUBtherapeutic 12/01 2102 0.31 Therapeutic x 1 12/02 0410 0.26 Subtherapeutic 12/02 1335 0.29 Subtherapeutic   Plan:  Bolus 1200 units x 1 Increase heparin infusion to 1350units/hr Recheck HL in 8 hr after rate change CBC daily while on heparin  Aubery Lapping, PharmD 03/27/2022 2:24 PM

## 2022-03-27 NOTE — Plan of Care (Signed)
RN POC discussed with Mr. Mcdonald's sons at bedside. Patient noted cough after drinking water, lung sounds dimished w/ assessment. Will discuss with rounding team in AM regarding swallow eval per family's request. Oral care provided. Will continue with frequent turns.   Problem: Education: Goal: Knowledge of General Education information will improve Description: Including pain rating scale, medication(s)/side effects and non-pharmacologic comfort measures Outcome: Not Progressing   Problem: Health Behavior/Discharge Planning: Goal: Ability to manage health-related needs will improve Outcome: Not Progressing   Problem: Activity: Goal: Risk for activity intolerance will decrease Outcome: Progressing Note: FREQUENT TURNS

## 2022-03-28 DIAGNOSIS — J9601 Acute respiratory failure with hypoxia: Secondary | ICD-10-CM | POA: Diagnosis not present

## 2022-03-28 LAB — CBC
HCT: 32.1 % — ABNORMAL LOW (ref 39.0–52.0)
Hemoglobin: 10.3 g/dL — ABNORMAL LOW (ref 13.0–17.0)
MCH: 32.3 pg (ref 26.0–34.0)
MCHC: 32.1 g/dL (ref 30.0–36.0)
MCV: 100.6 fL — ABNORMAL HIGH (ref 80.0–100.0)
Platelets: 121 10*3/uL — ABNORMAL LOW (ref 150–400)
RBC: 3.19 MIL/uL — ABNORMAL LOW (ref 4.22–5.81)
RDW: 14.1 % (ref 11.5–15.5)
WBC: 8 10*3/uL (ref 4.0–10.5)
nRBC: 0 % (ref 0.0–0.2)

## 2022-03-28 LAB — RENAL FUNCTION PANEL
Albumin: 2.5 g/dL — ABNORMAL LOW (ref 3.5–5.0)
Anion gap: 6 (ref 5–15)
BUN: 41 mg/dL — ABNORMAL HIGH (ref 8–23)
CO2: 27 mmol/L (ref 22–32)
Calcium: 8.7 mg/dL — ABNORMAL LOW (ref 8.9–10.3)
Chloride: 109 mmol/L (ref 98–111)
Creatinine, Ser: 1.33 mg/dL — ABNORMAL HIGH (ref 0.61–1.24)
GFR, Estimated: 50 mL/min — ABNORMAL LOW (ref >60)
Glucose, Bld: 127 mg/dL — ABNORMAL HIGH (ref 70–99)
Phosphorus: 2.6 mg/dL (ref 2.5–4.6)
Potassium: 4.7 mmol/L (ref 3.5–5.1)
Sodium: 142 mmol/L (ref 135–145)

## 2022-03-28 MED ORDER — MAGNESIUM HYDROXIDE 400 MG/5ML PO SUSP
30.0000 mL | Freq: Once | ORAL | Status: AC
  Administered 2022-03-28: 30 mL via ORAL
  Filled 2022-03-28: qty 30

## 2022-03-28 NOTE — Progress Notes (Signed)
[facility]  ROUNDING NOTE   Subjective:   Patient seen sitting up in bed, family at bedside Appears to be resting comfortably Remains on [place] No lower extremity edema  Creatinine 1.33 Urine output 1L in past 24 hours  Objective:  Vital signs in last 24 hours:  Temp:  [97.7 F (36.5 C)-98.1 F (36.7 C)] 97.7 F (36.5 C) (12/03 0317) Pulse Rate:  [83-100] 100 (12/03 0317) Resp:  [15-22] 17 (12/03 0317) BP: (110-121)/(67-83) 118/83 (12/03 0317) SpO2:  [93 %-98 %] 95 % (12/03 0700)  Weight change:  Filed Weights   03/23/22 2029  Weight: 77.1 kg    Intake/Output: I/O last 3 completed shifts: In: 324.4 [P.O.:120; I.V.:204.4] Out: 1500 [Urine:1500]   Intake/Output this shift:  No intake/output data recorded.  Physical Exam: General: NAD  Head: Normocephalic, atraumatic. Moist oral mucosal membranes  Eyes: Anicteric  Lungs:  Rhonchi, normal effort, [place] O2  Heart: Regular rate and rhythm  Abdomen:  Soft, nontender, nondistended  Extremities: No peripheral edema.  Neurologic: Alert and oriented  Skin: No lesions  Access: None    Basic Metabolic Panel: Recent Labs  Lab 03/24/22 0323 03/25/22 0634 03/26/22 0318 03/27/22 0410 03/28/22 0446  NA 139 142 143 141 142  K 4.3 4.6 4.4 4.6 4.7  CL 112* 111 110 109 109  CO2 23 25 26 27 27   GLUCOSE 141* 150* 140* 126* 127*  BUN 31* 41* 50* 49* 41*  CREATININE 1.40* 1.58* 1.90* 1.58* 1.33*  CALCIUM 9.0 8.4* 8.7* 8.5* 8.7*  PHOS  --   --   --   --  2.6     Liver Function Tests: Recent Labs  Lab 03/23/22 2058 03/28/22 0446  AST 25  --   ALT 16  --   ALKPHOS 68  --   BILITOT 1.0  --   PROT 6.1*  --   ALBUMIN 3.4* 2.5*    No results for input(s): "LIPASE", "AMYLASE" in the last 168 hours. No results for input(s): "AMMONIA" in the last 168 hours.  CBC: Recent Labs  Lab 03/23/22 2058 03/24/22 0323 03/25/22 0634 03/26/22 0318 03/27/22 0410 03/28/22 0443  WBC 6.6 14.9* 10.2 14.5* 8.7 8.0   NEUTROABS 5.2  --   --   --   --   --   HGB 13.1 12.5* 11.6* 11.3* 10.6* 10.3*  HCT 39.5 38.2* 36.1* 34.6* 33.0* 32.1*  MCV 99.5 99.0 100.3* 100.0 100.0 100.6*  PLT 144* 147* 107* 114* 108* 121*     Cardiac Enzymes: No results for input(s): "CKTOTAL", "CKMB", "CKMBINDEX", "TROPONINI" in the last 168 hours.  BNP: Invalid input(s): "POCBNP"  CBG: Recent Labs  Lab 03/24/22 2030 03/25/22 1642 03/27/22 2344  GLUCAP 146* 197* 116*     Microbiology: Results for orders placed or performed during the hospital encounter of 03/23/22  Surgical PCR screen     Status: None   Collection Time: 03/24/22  5:50 AM   Specimen: Nasal Mucosa; Nasal Swab  Result Value Ref Range Status   MRSA, PCR NEGATIVE NEGATIVE Final   Staphylococcus aureus NEGATIVE NEGATIVE Final    Comment: (NOTE) The Xpert SA Assay (FDA approved for NASAL specimens in patients 31 years of age and older), is one component of a comprehensive surveillance program. It is not intended to diagnose infection nor to guide or monitor treatment. Performed at [facility], [address], [place], [place] [address]     Coagulation Studies: Recent Labs    03/25/22 1930  LABPROT 15.6*  INR 1.3*     Urinalysis: No results for input(s): "COLORURINE", "LABSPEC", "PHURINE", "GLUCOSEU", "HGBUR", "BILIRUBINUR", "KETONESUR", "PROTEINUR", "UROBILINOGEN", "NITRITE", "LEUKOCYTESUR" in the last 72 hours.  Invalid input(s): "APPERANCEUR"    Imaging: ECHOCARDIOGRAM COMPLETE  Result Date: 03/26/2022    ECHOCARDIOGRAM REPORT   Patient Name:   Eric Hicks Date of Exam: 03/26/2022 Medical Rec #:  [id number]    Height:       70.0 in Accession #:    [id number]   Weight:       170.0 lb Date of Birth:  [birth year]    BSA:          1.948 m Patient Age:    88 years     BP:           126/75 mmHg Patient Gender: M            HR:           84 bpm. Exam Location:  [facility] Procedure: 2D Echo, Cardiac Doppler and Color Doppler Indications:      Abnormal ECG R94.31  History:         Patient has prior history of Echocardiogram examinations, most                  recent 06/29/2018. COPD; Risk Factors:Hypertension.  Sonographer:     Sherrie Sport Referring Phys:  [id number] [facility] AMIN Diagnosing Phys: Yolonda Kida MD  Sonographer Comments: Image quality was good. IMPRESSIONS  1. Left ventricular ejection fraction, by estimation, is 55 to 60%. The left ventricle has normal function. The left ventricle has no regional wall motion abnormalities. There is mild concentric left ventricular hypertrophy. Left ventricular diastolic parameters are consistent with Grade I diastolic dysfunction (impaired relaxation).  2. Right ventricular systolic function is normal. The right ventricular size is normal.  3. Left atrial size was mildly dilated.  4. Right atrial size was mildly dilated.  5. The mitral valve is grossly normal. Mild mitral valve regurgitation.  6. The aortic valve is calcified. Aortic valve regurgitation is mild to moderate. FINDINGS  Left Ventricle: Left ventricular ejection fraction, by estimation, is 55 to 60%. The left ventricle has normal function. The left ventricle has no regional wall motion abnormalities. The left ventricular internal cavity size was normal in size. There is  mild concentric left ventricular hypertrophy. Left ventricular diastolic parameters are consistent with Grade I diastolic dysfunction (impaired relaxation). Right Ventricle: The right ventricular size is normal. No increase in right ventricular wall thickness. Right ventricular systolic function is normal. Left Atrium: Left atrial size was mildly dilated. Right Atrium: Right atrial size was mildly dilated. Pericardium: There is no evidence of pericardial effusion. Mitral Valve: The mitral valve is grossly normal. There is mild thickening of the mitral valve leaflet(s). There is mild calcification of the mitral valve leaflet(s). Mildly decreased mobility of the mitral valve  leaflets. Mild mitral annular calcification. Mild mitral valve regurgitation. Tricuspid Valve: The tricuspid valve is normal in structure. Tricuspid valve regurgitation is trivial. Aortic Valve: The aortic valve is calcified. Aortic valve regurgitation is mild to moderate. Aortic regurgitation PHT measures 396 msec. Aortic valve mean gradient measures 3.0 mmHg. Aortic valve peak gradient measures 5.5 mmHg. Aortic valve area, by VTI  measures 3.22 cm. Pulmonic Valve: The pulmonic valve was normal in structure. Pulmonic valve regurgitation is not visualized. Aorta: The ascending aorta was not well visualized. IAS/Shunts: No atrial level shunt detected by color flow Doppler.  LEFT VENTRICLE  PLAX 2D LVOT diam:     2.20 cm LV SV:         55 LV SV Index:   28 LVOT Area:     3.80 cm  RIGHT VENTRICLE RV Basal diam:  4.60 cm RV Mid diam:    3.90 cm RV S prime:     11.40 cm/s TAPSE (M-mode): 1.2 cm LEFT ATRIUM             Index        RIGHT ATRIUM           Index LA diam:        4.10 cm 2.10 cm/m   RA Area:     28.00 cm LA Vol (A2C):   64.8 ml 33.26 ml/m  RA Volume:   83.20 ml  42.71 ml/m LA Vol (A4C):   48.5 ml 24.90 ml/m LA Biplane Vol: 57.0 ml 29.26 ml/m  AORTIC VALVE AV Area (Vmax):    2.98 cm AV Area (Vmean):   2.85 cm AV Area (VTI):     3.22 cm AV Vmax:           117.00 cm/s AV Vmean:          81.500 cm/s AV VTI:            0.171 m AV Peak Grad:      5.5 mmHg AV Mean Grad:      3.0 mmHg LVOT Vmax:         91.80 cm/s LVOT Vmean:        61.000 cm/s LVOT VTI:          0.145 m LVOT/AV VTI ratio: 0.85 AI PHT:            396 msec  AORTA Ao Root diam: 3.90 cm MITRAL VALVE               TRICUSPID VALVE MV Area (PHT): 4.99 cm    TR Peak grad:   28.1 mmHg MV Decel Time: 152 msec    TR Vmax:        265.00 cm/s MV E velocity: 81.80 cm/s                            SHUNTS                            Systemic VTI:  0.14 m                            Systemic Diam: 2.20 cm Dwayne D Callwood MD Electronically signed by Yolonda Kida MD Signature Date/Time: 03/26/2022/1:43:34 PM    Final      Medications:      apixaban  10 mg Oral BID   Followed by   Derrill Memo ON 04/03/2022] apixaban  5 mg Oral BID   atorvastatin  20 mg Oral QODAY   calcium carbonate  1,250 mg Oral Q breakfast   carvedilol  6.25 mg Oral BID   cholecalciferol  1,000 Units Oral Daily   docusate sodium  100 mg Oral BID   dorzolamide-timolol  1 drop Both Eyes BID   doxazosin  1 mg Oral Daily   Fe Fum-Vit C-Vit B12-FA  1 capsule Oral QPC breakfast   fluticasone furoate-vilanterol  1 puff Inhalation Daily   latanoprost  1 drop Both Eyes QHS  mupirocin ointment  1 Application Nasal BID   menthol-cetylpyridinium **OR** phenol, metoCLOPramide **OR** metoCLOPramide (REGLAN) injection, morphine injection, ondansetron **OR** ondansetron (ZOFRAN) IV, oxyCODONE, senna-docusate, traMADol  Assessment/ Plan:  Eric Hicks is a 88 y.o.  male with past medical history of hypertension, hyperlipidemia, and white coat syndrome, who was admitted to [facility] on 03/23/2022 for Closed right hip fracture ([place]) [S72.001A] Closed fracture of right hip, initial encounter ([place]) [S72.001A]   Acute kidney injury on chronic kidney disease stage IIIa. Baseline creatinine appears to be 1.34 on 03/08/22. Acute kidney injury likely secondary to cardiorenal syndrome. Renal ultrasound shows 41mm non-obstructing calculus at the right upper pole.   Renal function has returned to baseline. Adequate urine output recorded.    Lab Results  Component Value Date   CREATININE 1.33 (H) 03/28/2022   CREATININE 1.58 (H) 03/27/2022   CREATININE 1.90 (H) 03/26/2022    Intake/Output Summary (Last 24 hours) at 03/28/2022 1157 Last data filed at 03/28/2022 0700 Gross per 24 hour  Intake 324.36 ml  Output 1050 ml  Net -725.64 ml    2. Acute on chronic diastolic heart failure. ECHO shows EF 55-60% with a Grade 1 diastolic dysfunction.    3. Acute respiratory failure with hypoxia, VQ  scan positive for pulmonary embolism. Transitioned to Eliquis. Weaning oxygen requirement.   4. Hypertension with chronic kidney disease. Home regimen includes carvedilol,  doxazosin and furosemide. Furosemide held. Will defer to Cardiology when to restart.  5. Secondary Hyperparathyroidism: PTH 65.2 on 07/18/20, phosphorus 3.3, calcium 8.1 on 08/14/21  Lab Results  Component Value Date   PTH 30 03/30/2017   CALCIUM 8.7 (L) 03/28/2022   PHOS 2.6 03/28/2022    Continue calcium carbonate and cholecalciferol.   6. Anemia of chronic kidney disease Lab Results  Component Value Date   HGB 10.3 (L) 03/28/2022  Hemoglobin above desired target.  Due to renal recovery, we will sign off at this time. Feel free to contact us with any questions and concerns.    LOS: 5   12/3/202311:57 AM

## 2022-03-28 NOTE — Progress Notes (Signed)
   Subjective: 4 Days Post-Op Procedure(s) (LRB): ARTHROPLASTY BIPOLAR HIP (HEMIARTHROPLASTY) (Right) Patient reports right hip pain as mild. Breathing/SOB improving. Son at bedside, Patient continuing to improve per Son. No complaints of chest pain/SOB. Tolerating po well. Plan is to go Skilled nursing facility after hospital stay.  Objective: Vital signs in last 24 hours: Temp:  [97.7 F (36.5 C)-98.1 F (36.7 C)] 97.7 F (36.5 C) (12/03 0317) Pulse Rate:  [83-100] 100 (12/03 0317) Resp:  [15-22] 17 (12/03 0317) BP: (107-121)/(67-83) 118/83 (12/03 0317) SpO2:  [93 %-98 %] 95 % (12/03 0700)  Intake/Output from previous day: 12/02 0701 - 12/03 0700 In: 324.4 [P.O.:120; I.V.:204.4] Out: 1050 [Urine:1050] Intake/Output this shift: No intake/output data recorded.  Recent Labs    03/26/22 0318 03/27/22 0410 03/28/22 0443  HGB 11.3* 10.6* 10.3*   Recent Labs    03/27/22 0410 03/28/22 0443  WBC 8.7 8.0  RBC 3.30* 3.19*  HCT 33.0* 32.1*  PLT 108* 121*   Recent Labs    03/27/22 0410 03/28/22 0446  NA 141 142  K 4.6 4.7  CL 109 109  CO2 27 27  BUN 49* 41*  CREATININE 1.58* 1.33*  GLUCOSE 126* 127*  CALCIUM 8.5* 8.7*   Recent Labs    03/25/22 1930  INR 1.3*    EXAM General - Patient is Alert, Appropriate, and Oriented. No distress. Extremity - Neurovascular intact Sensation intact distally Intact pulses distally Dorsiflexion/Plantar flexion intact No swelling in the lower extremities.  Negative Homans' sign bilaterally. Dressing - dressing C/D/I and no drainage Motor Function - intact, moving foot and toes well on exam.   Past Medical History:  Diagnosis Date   BPH (benign prostatic hyperplasia)    managed by Olena Heckle   COPD (chronic obstructive pulmonary disease) ([place])    Elevated PSA, between 10 and less than 20 ng/ml    Olena Heckle, watchful waiting   Hypertension    Osteoporosis    by DEXA    Assessment/Plan:   4 Days Post-Op Procedure(s)  (LRB): ARTHROPLASTY BIPOLAR HIP (HEMIARTHROPLASTY) (Right) Principal Problem:   Acute respiratory failure with hypoxia (HCC) Active Problems:   BPH (benign prostatic hyperplasia)   White coat syndrome with diagnosis of hypertension   Nonischemic dilated cardiomyopathy (HCC)   Paroxysmal atrial fibrillation (HCC)   Closed right hip fracture (HCC)   Stage 3a chronic kidney disease (CKD) ([place])   Hyperlipidemia   Acute pulmonary embolism ([place])  Estimated body mass index is 24.39 kg/m as calculated from the following:   Height as of this encounter: 5\' 10"  (1.778 m).   Weight as of this encounter: 77.1 kg. Advance diet Up with therapy Work on BM, milk of mag ordered RIght hip pain well controlled Vital signs are stable Labs are stable, hemoglobin  10.3 Pulmonary Embolism - on Eliquis. Ok to continue with PT. PT orders placed. WBAT RLE CM to assist with discharge to SNF  Patient will need 2 week follow up with [facility] ortho    Weight-Bearing as tolerated to right leg   T. Rachelle Hora, PA-C [place] 03/28/2022, 8:36 AM

## 2022-03-28 NOTE — Evaluation (Signed)
Physical Therapy Re-Evaluation Patient Details Name: Eric Hicks MRN: [id number] DOB: 13-Jul-1926 Today's Date: 03/28/2022  History of Present Illness  Pt presented to ER secondary to mechanical fall with acute onset of R hip/elbow pain; admitted for management of closed R hip fracture, s/p R hip hemiarthroplasty (03/24/22).  Pt with rapid response 11/30 and transferred to PCU; pt noted with PE 12/1.  Clinical Impression  New PT consult received so PT re-evaluation performed.  Prior to hospital admission, pt was independent with ambulation; lives alone.  Pt reporting minimal to no R hip pain at rest but moderate R hip pain with activity.  Currently pt is max assist with bed mobility; mod to max assist to stand from significantly elevated bed height (x2 trials) with RW use; and min to mod assist to take a couple steps in place with RW use.  Activity limited d/t generalized weakness, fatigue, and R hip pain.  Pt would benefit from skilled PT to address noted impairments and functional limitations (see below for any additional details).  Upon hospital discharge, pt would benefit from SNF.    Recommendations for follow up therapy are one component of a multi-disciplinary discharge planning process, led by the attending physician.  Recommendations may be updated based on patient status, additional functional criteria and insurance authorization.  Follow Up Recommendations Skilled nursing-short term rehab (<3 hours/day) Can patient physically be transported by private vehicle: No    Assistance Recommended at Discharge Frequent or constant Supervision/Assistance  Patient can return home with the following  Two people to help with walking and/or transfers;Two people to help with bathing/dressing/bathroom;Assistance with cooking/housework;Assist for transportation;Help with stairs or ramp for entrance    Equipment Recommendations Rolling walker (2 wheels);BSC/3in1;Wheelchair (measurements PT);Wheelchair  cushion (measurements PT)  Recommendations for Other Services       Functional Status Assessment Patient has had a recent decline in their functional status and demonstrates the ability to make significant improvements in function in a reasonable and predictable amount of time.     Precautions / Restrictions Precautions Precautions: Fall;Posterior Hip Restrictions Weight Bearing Restrictions: Yes RLE Weight Bearing: Weight bearing as tolerated      Mobility  Bed Mobility Overal bed mobility: Needs Assistance Bed Mobility: Supine to Sit, Sit to Supine     Supine to sit: Max assist, HOB elevated Sit to supine: Max assist, HOB elevated   General bed mobility comments: assist for trunk and B LE's; vc's for technique; 2 assist to boost pt up in bed end of session using bed pad    Transfers Overall transfer level: Needs assistance Equipment used: Rolling walker (2 wheels) Transfers: Sit to/from Stand Sit to Stand: Mod assist, Max assist, From elevated surface           General transfer comment: mod to max assist to stand from significantly elevated bed height x2 trials; vc's for UE/LE placement; assist to initiate and come to full stand; assist to control descent sitting    Ambulation/Gait Ambulation/Gait assistance: Min assist, Mod assist Gait Distance (Feet):  (pt took a couple steps in place) Assistive device: Rolling walker (2 wheels)   Gait velocity: decreased     General Gait Details: antalgic; decreased stance time R LE; vc's for walker use and gait technique; increased effort/time to take steps  Stairs            Wheelchair Mobility    Modified Rankin (Stroke Patients Only)       Balance Overall balance assessment: Needs assistance Sitting-balance  support: No upper extremity supported, Feet supported Sitting balance-Leahy Scale: Fair Sitting balance - Comments: steady static sitting but L lean noted (d/t R hip discomfort) Postural control: Left  lateral lean Standing balance support: Bilateral upper extremity supported, Reliant on assistive device for balance Standing balance-Leahy Scale: Fair Standing balance comment: steady static standing with B UE support on RW                             Pertinent Vitals/Pain Pain Assessment Pain Score: 0-No pain Faces Pain Scale:  (0/10 at rest; 6/10 with activity) Pain Location: R hip Pain Descriptors / Indicators: Aching, Grimacing, Guarding Pain Intervention(s): Limited activity within patient's tolerance, Monitored during session, Repositioned Pt's HR in 90's bpm range during sessions activities and O2 sats 94% or greater on 4.5 L/min O2 via nasal cannula during sessions activities.    Home Living Family/patient expects to be discharged to:: Private residence Living Arrangements: Alone Available Help at Discharge: Family Type of Home: House Home Access: Stairs to enter Entrance Stairs-Rails: Right;Left;Can reach both Entrance Stairs-Number of Steps: [place]: Two level;Able to live on main level with bedroom/bathroom        Prior Function Prior Level of Function : Independent/Modified Independent             Mobility Comments: Per initial PT evaluation "Indep with ADLs, household and some community mobilization without assist device; + driving; denies fall history outside of this admission".       Hand Dominance        Extremity/Trunk Assessment   Upper Extremity Assessment Upper Extremity Assessment: Generalized weakness    Lower Extremity Assessment Lower Extremity Assessment: Generalized weakness (R hip at least 2+5 (limited d/t hip pain); R knee flexion/extension and DF/PF at least 3/5 AROM)    Cervical / Trunk Assessment Cervical / Trunk Assessment: Other exceptions Cervical / Trunk Exceptions: forward head/shoulders  Communication   Communication: HOH  Cognition Arousal/Alertness:  (pt drowsy but becomes alert with  activity) Behavior During Therapy: WFL for tasks assessed/performed Overall Cognitive Status: Within Functional Limits for tasks assessed                                          General Comments  Nursing cleared pt for participation in physical therapy.  Pt agreeable to PT session.  Pt's son and daughter present during session.    Exercises Total Joint Exercises Ankle Circles/Pumps: AROM, Strengthening, Both, 10 reps, Supine Heel Slides: AAROM, Strengthening, Right, 10 reps, Supine   Assessment/Plan    PT Assessment Patient needs continued PT services  PT Problem List Decreased strength;Decreased range of motion;Decreased balance;Decreased activity tolerance;Decreased knowledge of precautions;Decreased mobility;Decreased coordination;Decreased cognition;Decreased knowledge of use of DME;Decreased safety awareness;Cardiopulmonary status limiting activity;Pain       PT Treatment Interventions DME instruction;Gait training;Functional mobility training;Stair training;Therapeutic activities;Therapeutic exercise;Balance training;Neuromuscular re-education;Patient/family education    PT Goals (Current goals can be found in the Care Plan section)  Acute Rehab PT Goals Patient Stated Goal: to go back home when ready PT Goal Formulation: With patient/family Time For Goal Achievement: 04/11/22 Potential to Achieve Goals: Fair    Frequency 7X/week     Co-evaluation               AM-PAC PT "6 Clicks" Mobility  Outcome Measure Help needed turning from  your back to your side while in a flat bed without using bedrails?: A Lot Help needed moving from lying on your back to sitting on the side of a flat bed without using bedrails?: A Lot Help needed moving to and from a bed to a chair (including a wheelchair)?: Total Help needed standing up from a chair using your arms (e.g., wheelchair or bedside chair)?: Total Help needed to walk in hospital room?: Total Help needed  climbing 3-5 steps with a railing? : Total 6 Click Score: 8    End of Session Equipment Utilized During Treatment: Gait belt Activity Tolerance: Patient limited by fatigue Patient left: in bed;with call bell/phone within reach;with bed alarm set;with family/visitor present;with SCD's reapplied;Other (comment) (hip aBduction pillow in place; B heels floating via towel rolls) Nurse Communication: Mobility status;Precautions;Weight bearing status PT Visit Diagnosis: Other abnormalities of gait and mobility (R26.89);Difficulty in walking, not elsewhere classified (R26.2);Pain;Muscle weakness (generalized) (M62.81) Pain - Right/Left: Right Pain - part of body: Hip    Time: 1141-1230 PT Time Calculation (min) (ACUTE ONLY): 49 min   Charges:   PT Evaluation $PT Re-evaluation: 1 Re-eval PT Treatments $Therapeutic Exercise: 8-22 mins $Therapeutic Activity: 8-22 mins       Leitha Bleak, PT 03/28/22, 1:28 PM

## 2022-03-28 NOTE — Progress Notes (Signed)
[facility] Cardiology    SUBJECTIVE: Patient resting comfortably in bed supplemental oxygen denies any worsening pain denies any worsening shortness of breath   Vitals:   03/28/22 0100 03/28/22 0200 03/28/22 0317 03/28/22 0700  BP:   118/83   Pulse:  86 100   Resp:  (!) 22 17   Temp:   97.7 F (36.5 C)   TempSrc:   Axillary   SpO2: 96% 98% 93% 95%  Weight:      Height:         Intake/Output Summary (Last 24 hours) at 03/28/2022 1139 Last data filed at 03/28/2022 0700 Gross per 24 hour  Intake 324.36 ml  Output 1050 ml  Net -725.64 ml      PHYSICAL EXAM  General: Well developed, well nourished, in no acute distress HEENT:  Normocephalic and atramatic Neck:  No JVD.  Lungs: Clear bilaterally to auscultation and percussion. Heart: HRRR . Normal S1 and S2 without gallops or murmurs.  Abdomen: Bowel sounds are positive, abdomen soft and non-tender  Msk:  Back normal, normal gait. Normal strength and tone for age. Extremities: No clubbing, cyanosis or edema.   Neuro: Alert and oriented X 3. Psych:  Good affect, responds appropriately   LABS: Basic Metabolic Panel: Recent Labs    03/27/22 0410 03/28/22 0446  NA 141 142  K 4.6 4.7  CL 109 109  CO2 27 27  GLUCOSE 126* 127*  BUN 49* 41*  CREATININE 1.58* 1.33*  CALCIUM 8.5* 8.7*  PHOS  --  2.6   Liver Function Tests: Recent Labs    03/28/22 0446  ALBUMIN 2.5*   No results for input(s): "LIPASE", "AMYLASE" in the last 72 hours. CBC: Recent Labs    03/27/22 0410 03/28/22 0443  WBC 8.7 8.0  HGB 10.6* 10.3*  HCT 33.0* 32.1*  MCV 100.0 100.6*  PLT 108* 121*   Cardiac Enzymes: No results for input(s): "CKTOTAL", "CKMB", "CKMBINDEX", "TROPONINI" in the last 72 hours. BNP: Invalid input(s): "POCBNP" D-Dimer: Recent Labs    03/25/22 1932  DDIMER 2.51*   Hemoglobin A1C: No results for input(s): "HGBA1C" in the last 72 hours. Fasting Lipid Panel: No results for input(s): "CHOL", "HDL", "LDLCALC", "TRIG",  "CHOLHDL", "LDLDIRECT" in the last 72 hours. Thyroid Function Tests: No results for input(s): "TSH", "T4TOTAL", "T3FREE", "THYROIDAB" in the last 72 hours.  Invalid input(s): "FREET3" Anemia Panel: No results for input(s): "VITAMINB12", "FOLATE", "FERRITIN", "TIBC", "IRON", "RETICCTPCT" in the last 72 hours.  ECHOCARDIOGRAM COMPLETE  Result Date: 03/26/2022    ECHOCARDIOGRAM REPORT   Patient Name:   Eric Hicks Date of Exam: 03/26/2022 Medical Rec #:  [id number]    Height:       70.0 in Accession #:    [id number]   Weight:       170.0 lb Date of Birth:  Jun 29, 1926    BSA:          1.948 m Patient Age:    88 years     BP:           126/75 mmHg Patient Gender: M            HR:           84 bpm. Exam Location:  [facility] Procedure: 2D Echo, Cardiac Doppler and Color Doppler Indications:     Abnormal ECG R94.31  History:         Patient has prior history of Echocardiogram examinations, most  recent 06/29/2018. COPD; Risk Factors:Hypertension.  Sonographer:     Sherrie Sport Referring Phys:  [id number] [facility] AMIN Diagnosing Phys: Yolonda Kida MD  Sonographer Comments: Image quality was good. IMPRESSIONS  1. Left ventricular ejection fraction, by estimation, is 55 to 60%. The left ventricle has normal function. The left ventricle has no regional wall motion abnormalities. There is mild concentric left ventricular hypertrophy. Left ventricular diastolic parameters are consistent with Grade I diastolic dysfunction (impaired relaxation).  2. Right ventricular systolic function is normal. The right ventricular size is normal.  3. Left atrial size was mildly dilated.  4. Right atrial size was mildly dilated.  5. The mitral valve is grossly normal. Mild mitral valve regurgitation.  6. The aortic valve is calcified. Aortic valve regurgitation is mild to moderate. FINDINGS  Left Ventricle: Left ventricular ejection fraction, by estimation, is 55 to 60%. The left ventricle has normal function. The left ventricle  has no regional wall motion abnormalities. The left ventricular internal cavity size was normal in size. There is  mild concentric left ventricular hypertrophy. Left ventricular diastolic parameters are consistent with Grade I diastolic dysfunction (impaired relaxation). Right Ventricle: The right ventricular size is normal. No increase in right ventricular wall thickness. Right ventricular systolic function is normal. Left Atrium: Left atrial size was mildly dilated. Right Atrium: Right atrial size was mildly dilated. Pericardium: There is no evidence of pericardial effusion. Mitral Valve: The mitral valve is grossly normal. There is mild thickening of the mitral valve leaflet(s). There is mild calcification of the mitral valve leaflet(s). Mildly decreased mobility of the mitral valve leaflets. Mild mitral annular calcification. Mild mitral valve regurgitation. Tricuspid Valve: The tricuspid valve is normal in structure. Tricuspid valve regurgitation is trivial. Aortic Valve: The aortic valve is calcified. Aortic valve regurgitation is mild to moderate. Aortic regurgitation PHT measures 396 msec. Aortic valve mean gradient measures 3.0 mmHg. Aortic valve peak gradient measures 5.5 mmHg. Aortic valve area, by VTI  measures 3.22 cm. Pulmonic Valve: The pulmonic valve was normal in structure. Pulmonic valve regurgitation is not visualized. Aorta: The ascending aorta was not well visualized. IAS/Shunts: No atrial level shunt detected by color flow Doppler.  LEFT VENTRICLE PLAX 2D LVOT diam:     2.20 cm LV SV:         55 LV SV Index:   28 LVOT Area:     3.80 cm  RIGHT VENTRICLE RV Basal diam:  4.60 cm RV Mid diam:    3.90 cm RV S prime:     11.40 cm/s TAPSE (M-mode): 1.2 cm LEFT ATRIUM             Index        RIGHT ATRIUM           Index LA diam:        4.10 cm 2.10 cm/m   RA Area:     28.00 cm LA Vol (A2C):   64.8 ml 33.26 ml/m  RA Volume:   83.20 ml  42.71 ml/m LA Vol (A4C):   48.5 ml 24.90 ml/m LA Biplane  Vol: 57.0 ml 29.26 ml/m  AORTIC VALVE AV Area (Vmax):    2.98 cm AV Area (Vmean):   2.85 cm AV Area (VTI):     3.22 cm AV Vmax:           117.00 cm/s AV Vmean:          81.500 cm/s AV VTI:  0.171 m AV Peak Grad:      5.5 mmHg AV Mean Grad:      3.0 mmHg LVOT Vmax:         91.80 cm/s LVOT Vmean:        61.000 cm/s LVOT VTI:          0.145 m LVOT/AV VTI ratio: 0.85 AI PHT:            396 msec  AORTA Ao Root diam: 3.90 cm MITRAL VALVE               TRICUSPID VALVE MV Area (PHT): 4.99 cm    TR Peak grad:   28.1 mmHg MV Decel Time: 152 msec    TR Vmax:        265.00 cm/s MV E velocity: 81.80 cm/s                            SHUNTS                            Systemic VTI:  0.14 m                            Systemic Diam: 2.20 cm Samyak Sackmann D Shloma Roggenkamp MD Electronically signed by Yolonda Kida MD Signature Date/Time: 03/26/2022/1:43:34 PM    Final      Echo preserved left ventricular function EF of around 60%  TELEMETRY: Atrial fibrillation rate of around 100 nonspecific ST-T wave changes:  ASSESSMENT AND PLAN:  Principal Problem:   Acute respiratory failure with hypoxia (HCC) Active Problems:   BPH (benign prostatic hyperplasia)   White coat syndrome with diagnosis of hypertension   Nonischemic dilated cardiomyopathy (HCC)   Paroxysmal atrial fibrillation (HCC)   Closed right hip fracture (HCC)   Stage 3a chronic kidney disease (CKD) (HCC)   Hyperlipidemia   Acute pulmonary embolism (HCC)    Plan Status post acute pulmonary embolus.  Currently on anticoagulation transition to Eliquis continue current management Chronic diastolic congestive heart failure continue current conservative management diuretics as necessary supplemental oxygen Chronic renal insufficiency possibly related to overdiuresis consider nephrology input Paroxysmal atrial fibrillation patient refused anticoagulation in the past Continue statin therapy for hyperlipidemia Status post total hip replacement continue  physical therapy consider skilled nursing facility for rehab Hypoxemia multifactorial pulm embolus congestive heart failure possibly COPD continue supplemental oxygen as necessary Conservative cardiac input at discharge   Yolonda Kida, MD 03/28/2022 11:39 AM

## 2022-03-28 NOTE — Progress Notes (Signed)
Progress Note   Patient: Eric Hicks OIB:[id number] DOB: Sep 22, 1926 DOA: 03/23/2022     5 DOS: the patient was seen and examined on 03/28/2022   Brief hospital course: Mr. Kaelan Amble is a 88 year old male with history of hyperlipidemia, hypertension, impaired fasting glucose, history of whitecoat syndrome, who presents emergency department for chief concerns of mechanical fall and falling on his right hip and elbow, resulted and mildly impacted right femoral neck fracture  Patient denies loss of consciousness and hitting his head.  Initial vitals in the emergency department showed temperature of 97.6, respiration rate 18, heart rate 78, blood pressure 158/106, SpO2 of 96% on room air.  Labs in the ED showed serum sodium 142, potassium 4.4, bicarb 26, BUN 28, serum creatinine 1.31, GFR 51, nonfasting glucose 122. WBC 6.6, hemoglobin 13.1, platelets 144. CT head, CT cervical spine, CT chest and EKG elbow was without any acute abnormality. DG hip with mildly impacted right femoral neck fracture  ED treatment: Tdap booster has been ordered for injection.  Oxycodone 5 mg p.o. one-time dose.  11/29: Hemodynamically stable on 3 L oxygen, labs pertinent for leukocytosis, most likely reactive, hemoglobin decreased to 12.5 from 13.1, BMP with slight worsening of BUN 31 and creatinine 1.40 which remained around his baseline of 1.3-1.4. Going to the OR with orthopedic surgery today.  11/30: Patient underwent hemiarthroplasty with orthopedic surgery on 11/29.  Required transfer Amick acid twice for bleeding.  Also received 1 dose of IV Lasix in PACU. Blood pressure mildly elevated at 142/82 this morning.  Renal function with slight deterioration to 41/1.58, GFR decreased to 41 from 47. Anemia panel with normal iron and low normal folate, B12 pending. Patient also with new oxygen requirement-history of COPD-no wheezing. Encouraging incentive spirometry and flutter valve. Chest x-ray with stable central  vascular congestion, bilateral perihilar airspace disease left pleural effusion and BNP markedly elevated at 1960.  Patient was given IV Lasix and started on regular diuresis.  Also found to have elevated troponin, patient was started on heparin infusion and cardiology was consulted.  12/1: Patient overnight with worsening respiratory status, started on heated high flow with FiO2 of 80%, which is now weaned to 55% and 45L.  D-dimer was also found to be elevated at 2.51 which also makes PE possible.  VQ scan ordered for concern of PE, trying to avoid contrast with CTA due to AKI.  VQ scan with acute PE.   Worsening renal function today with BUN 50, creatinine 1.90.  Renal ultrasound with no hydronephrosis , some cortical thinning and no other significant abnormality. nephrology was also consulted. Holding a.m. dose of Lasix. Echocardiogram with improvement of EF to 50 to 94%, grade 1 diastolic dysfunction and no regional wall motion abnormalities.  No right heart strain mentioned. I will let cardiology decide about further diuresis.  Patient might get benefit from Lasix infusion for gentle diuresis. Also sending message to vascular surgery due to worsening respiratory status.  Patient with multiorgan failure, high risk for deterioration and mortality.  Discussed with his 2 sons and daughter at bedside.  They do have some legal papers which tells that he does not want extraordinary measures like feeding tube.  Not sure about DNR, he is currently full code.  Discussed with family to decide.  Also consulting palliative care.  12/2: Patient currently hemodynamically stable with improved oxygen requirement, able to wean to 6 L.  Improving renal function with GFR of 41 and creatinine of 1.58 today. He was not  a candidate for any surgical or vascular intervention as being evaluated by vascular surgery also.  Leukocytosis resolved.  12/3: Vital seems stable, saturating in mid 90s on 4 L of oxygen.  Creatinine  improved to baseline, at 1.33.  CBC seems stable  Assessment and Plan: * Acute respiratory failure with hypoxia (HCC) Seems improving, he was able to weaned down to 4 L.  VQ scan with acute pulmonary embolism.  Elevated BNP and echocardiogram with improvement of his EF, no regional wall motion abnormalities and grade 1 diastolic dysfunction.  Initially treated with heparin -Continue with Eliquis  Acute pulmonary embolism (HCC) VQ scan positive for acute pulmonary embolism.  Unable to do CTA due to AKI. Worsening respiratory status. Not a candidate for any surgical intervention. Aspiratory status improving -Switch him to Eliquis  Closed right hip fracture (HCC) Secondary to mechanical fall, s/p hemiarthroplasty on 10/29. -PT/OT evaluation -Continue with pain management-try avoiding opioids.  Stage 3a chronic kidney disease (CKD) ([place]) AKI resolved.  Creatinine currently at baseline Patient developed AKI with CKD stage IIIa.   Creatinine started improving after holding Lasix. Renal ultrasound with cortical thickening and no hydronephrosis. -Monitor renal function -Avoid nephrotoxins  Nonischemic dilated cardiomyopathy ([place]) Repeat echocardiogram with improvement of EF to 50 to 55% with grade 1 diastolic dysfunction.  No regional wall motion abnormalities or right ventricular strain noted.  Troponin elevated most likely secondary to demand with acute pulmonary embolism and respiratory distress.  BNP at 1963. Patient did receive IV Lasix due to pulmonary vascular congestion and respiratory distress resulted in worsening of AKI. -Cardiology consult -Continue home carvedilol and statin -Holding further Lasix-will let nephrology and cardiology decide  White coat syndrome with diagnosis of hypertension Blood pressure mostly within goal. -Continue carvedilol and Cardura  BPH (benign prostatic hyperplasia) -Continue Cardura  Paroxysmal atrial fibrillation (HCC) Chronic atrial  fibrillation and sees [facility] cardiology - Per note on 02/11/2022, patient has declined anticoagulation therefore he is not on anticoagulation - Patient is on carvedilol 6.25 mg p.o. twice daily, this was resumed  Hyperlipidemia - Atorvastatin 20 mg daily resumed   Subjective: Patient was resting comfortably when seen today.  No new concern.  Physical Exam: Vitals:   03/28/22 0100 03/28/22 0200 03/28/22 0317 03/28/22 0700  BP:   118/83   Pulse:  86 100   Resp:  (!) 22 17   Temp:   97.7 F (36.5 C)   TempSrc:   Axillary   SpO2: 96% 98% 93% 95%  Weight:      Height:       General.  Frail elderly man, in no acute distress. Pulmonary.  Lungs clear bilaterally, normal respiratory effort. CV.  Regular rate and rhythm, no JVD, rub or murmur. Abdomen.  Soft, nontender, nondistended, BS positive. CNS.  Alert and oriented .  No focal neurologic deficit. Extremities.  No edema, no cyanosis, pulses intact and symmetrical. Psychiatry.  Judgment and insight appears normal.   Data Reviewed: Prior data reviewed  Family Communication: Discussed with son and daughter at bedside  Disposition: Status is: Inpatient Remains inpatient appropriate because: Severity of illness  Planned Discharge Destination: SNF  Time spent: 45 minutes  This record has been created using Systems analyst. Errors have been sought and corrected,but may not always be located. Such creation errors do not reflect on the standard of care.   Author: Lorella Nimrod, MD 03/28/2022 4:27 PM  For on call review [email or web address].

## 2022-03-29 ENCOUNTER — Telehealth (HOSPITAL_COMMUNITY): Payer: Self-pay | Admitting: Pharmacy Technician

## 2022-03-29 ENCOUNTER — Other Ambulatory Visit (HOSPITAL_COMMUNITY): Payer: Self-pay

## 2022-03-29 DIAGNOSIS — J9601 Acute respiratory failure with hypoxia: Secondary | ICD-10-CM | POA: Diagnosis not present

## 2022-03-29 DIAGNOSIS — R531 Weakness: Secondary | ICD-10-CM

## 2022-03-29 DIAGNOSIS — Z515 Encounter for palliative care: Secondary | ICD-10-CM | POA: Diagnosis not present

## 2022-03-29 DIAGNOSIS — N1831 Chronic kidney disease, stage 3a: Secondary | ICD-10-CM

## 2022-03-29 DIAGNOSIS — Z66 Do not resuscitate: Secondary | ICD-10-CM

## 2022-03-29 MED ORDER — FUROSEMIDE 40 MG PO TABS
40.0000 mg | ORAL_TABLET | Freq: Every day | ORAL | Status: DC
  Administered 2022-03-29 – 2022-03-30 (×2): 40 mg via ORAL
  Filled 2022-03-29 (×2): qty 1

## 2022-03-29 NOTE — TOC Benefit Eligibility Note (Signed)
Patient Research scientist (life sciences) completed.     The patient is currently admitted and upon discharge could be taking Eliquis 5mg .   The current 30 day co-pay is, $149.97.   The patient is insured through [employer].

## 2022-03-29 NOTE — Consult Note (Signed)
Consultation Note Date: 03/29/2022   Patient Name: Eric Hicks  DOB: February 02, 1927  MRN: [id number]  Age / Sex: 88 y.o., male  PCP: Eric Mc, MD Referring Physician: Lorella Nimrod, MD  Reason for Consultation: Establishing goals of care and Psychosocial/spiritual support  HPI/Patient Profile: 88 y.o. male   admitted on 03/23/2022 with  past medical history of hypertension, hyperlipidemia, and white coat syndrome, who was admitted to [facility] on 03/23/2022 for Closed right hip fracture/ fall at home,  s/p repair 03-24-22   PMH significant for Acute kidney injury on chronic kidney disease stage IIIa. Baseline creatinine appears to be 1.34 on 03/08/22. Acute kidney injury likely secondary to cardiorenal syndrome. Renal ultrasound shows 67m non-obstructing calculus at the right upper pole.  Acute on chronic diastolic heart failure. ECHO completed today shows EF 55-60% with a Grade 1 diastolic dysfunction.  Acute respiratory failure with hypoxia, VQ scan positive for pulmonary embolism. Continue heparin drip and weaning oxygen requirements as tolerated.   Today is day 5 of this hospital stay.  He continues with generalized weakness and fatigue, poor po intake.  Eric Hicks did work with therapy today and got OOB to chair    Patient and family face treatment options decisions, advanced directive decisions and anticipatory care needs.    Clinical Assessment and Goals of Care:  This NP Eric Lessenreviewed medical records, received report from team, assessed the patient and then meet at the patient's bedside with his three children sons, Eric Hicks  to discuss diagnosis, prognosis, GOC, EOL wishes disposition and options.   Concept of Palliative Care was introduced as specialized medical care for people and their families living with serious illness.  If focuses on providing relief from the symptoms  and stress of a serious illness.  The goal is to improve quality of life for both the patient and the family.   Values and goals of care important to patient and family were attempted to be elicited.  Created space and opportunity for patient  and family to explore thoughts and feelings regarding current medical situation.  Patient is too lethargic to participate in today's conversation.   All three children agree that comfort and dignity are priority to Eric Hicks. His children speak of his fierce independence and " being the caregiver" for others     A  discussion was had today regarding advanced directives.  Concepts specific to code status, artifical feeding and hydration, continued IV antibiotics and rehospitalization was had.  The difference between a aggressive medical intervention path  and a palliative comfort care path for this patient at this time was had.     MOST form introduced  Education offered regarding patient multiple co-morbidities and concept of adult failure to thrive.  Education offered on his overall weakness and its impact on swallow/dysphagia.   Education offered on the difference between an full medical support path and a comfort path  for this patient, at this time, in this situation was detailed  At this time patient and family are hopeful for improvement, continue with current treatment plan.  Future decisions will be dependent on patient's physical, functional and cognitive outcomes over the next days to weeks.  Family is anticipating short-term rehab      Questions and concerns addressed.  Patient  encouraged to call with questions or concerns.     PMT will continue to support holistically.          [place] has ACP documents, scanned for EMR.  He has a directive for a Eric Hicks for a Narual Death    HCPOA/daughter Eric Hicks     SUMMARY OF RECOMMENDATIONS    Code Status/Advance Care Planning: DNR-docuemtned today     Palliative Prophylaxis:   Aspiration, Bowel Regimen, Delirium Protocol, Frequent Pain Assessment, and Oral Care  Additional Recommendations (Limitations, Scope, Preferences): Full Scope Treatment  Psycho-social/Spiritual:  Desire for further Chaplaincy support:no- strong community church support Additional Recommendations: Education on Hospice  Prognosis:  Unable to determine  Discharge Planning: To Be Determined      Primary Diagnoses: Present on Admission:  Closed right hip fracture (HCC)  White coat syndrome with diagnosis of hypertension  Nonischemic dilated cardiomyopathy (HCC)  Paroxysmal atrial fibrillation (HCC)  BPH (benign prostatic hyperplasia)  Stage 3a chronic kidney disease (CKD) ([place])  Hyperlipidemia   I have reviewed the medical record, interviewed the patient and family, and examined the patient. The following aspects are pertinent.  Past Medical History:  Diagnosis Date   BPH (benign prostatic hyperplasia)    managed by Olena Heckle   COPD (chronic obstructive pulmonary disease) ([place])    Elevated PSA, between 10 and less than 20 ng/ml    Olena Heckle, watchful waiting   Hypertension    Osteoporosis    by DEXA   Social History   Socioeconomic History   Marital status: Widowed    Spouse name: Not on file   Number of children: Not on file   Years of education: Not on file   Highest education level: Not on file  Occupational History   Not on file  Tobacco Use   Smoking status: Former   Smokeless tobacco: Never  Vaping Use   Vaping Use: Never used  Substance and Sexual Activity   Alcohol use: No   Drug use: Not on file   Sexual activity: Never  Other Topics Concern   Not on file  Social History Narrative   Regular exercise-yew   Social Determinants of Health   Financial Resource Strain: Low Risk  (10/07/2021)   Overall Financial Resource Strain (CARDIA)    Difficulty of Paying Living Expenses: Not hard at all  Food Insecurity: No Food Insecurity (03/24/2022)   Hunger  Vital Sign    Worried About Running Out of Food in the Last Year: Never true    [place] in the Last Year: Never true  Transportation Needs: No Transportation Needs (03/24/2022)   PRAPARE - Hydrologist (Medical): No    Lack of Transportation (Non-Medical): No  Physical Activity: Not on file  Stress: No Stress Concern Present (10/07/2021)   [place]    Feeling of Stress : Not at all  Social Connections: Unknown (10/07/2021)   Social Connection and Isolation Panel [NHANES]    Frequency of Communication with Friends and Family: More than three times a week    Frequency of Social Gatherings with Friends and Family: Not on file  Attends Religious Services: Not on file    Active Member of Clubs or Organizations: Not on file    Attends Club or Organization Meetings: Not on file    Marital Status: Widowed   Family History  Problem Relation Age of Onset   Diabetes Mother    Hypertension Father    Cancer Brother 32       multiple myeloma   Scheduled Meds:  apixaban  10 mg Oral BID   Followed by   Derrill Memo ON 04/03/2022] apixaban  5 mg Oral BID   atorvastatin  20 mg Oral QODAY   calcium carbonate  1,250 mg Oral Q breakfast   carvedilol  6.25 mg Oral BID   cholecalciferol  1,000 Units Oral Daily   docusate sodium  100 mg Oral BID   dorzolamide-timolol  1 drop Both Eyes BID   doxazosin  1 mg Oral Daily   Fe Fum-Vit C-Vit B12-FA  1 capsule Oral QPC breakfast   fluticasone furoate-vilanterol  1 puff Inhalation Daily   latanoprost  1 drop Both Eyes QHS   Continuous Infusions: PRN Meds:.menthol-cetylpyridinium **OR** phenol, metoCLOPramide **OR** metoCLOPramide (REGLAN) injection, morphine injection, ondansetron **OR** ondansetron (ZOFRAN) IV, oxyCODONE, senna-docusate, traMADol Medications Prior to Admission:  Prior to Admission medications   Medication Sig Start Date End Date Taking?  Authorizing Provider  ADVAIR DISKUS 100-50 MCG/ACT AEPB INHALE 1 PUFF INTO THE LUNGS TWICE A DAY 02/01/22  Yes Eric Mc, MD  Aflibercept (EYLEA IO) Inject into the eye.   Yes [provider]  AMBULATORY NON FORMULARY MEDICATION Joint Advantage Gold 2 tablets daily   Yes [provider]  atorvastatin (LIPITOR) 20 MG tablet TAKE 1 TABLET BY MOUTH EVERY OTHER DAY 08/28/21  Yes Eric Mc, MD  calcium carbonate (OS-CAL) 600 MG TABS Take 600 mg by mouth daily with breakfast.    Yes [provider]  carvedilol (COREG) 6.25 MG tablet Take 6.25 mg by mouth 2 (two) times daily. 05/19/19  Yes [provider]  dorzolamide-timolol (COSOPT) 22.3-6.8 MG/ML ophthalmic solution TAKE 1 DROP(S) IN RIGHT EYE 2 TIMES A DAY 07/29/17  Yes [provider]  doxazosin (CARDURA) 1 MG tablet TAKE 1 TABLET BY MOUTH EVERY DAY 09/28/21  Yes Eric Mc, MD  furosemide (LASIX) 20 MG tablet TAKE 2 TABLETS BY MOUTH DAILY AS NEEDED 12/20/21  Yes Dutch Quint B, FNP  latanoprost (XALATAN) 0.005 % ophthalmic solution Place 1 drop into both eyes at bedtime. 03/21/18  Yes [provider]  Vitamin D, Cholecalciferol, 25 MCG (1000 UT) CAPS Take 1 capsule by mouth daily.   Yes [provider]  leuprolide, 6 Month, (ELIGARD) 45 MG injection Inject 45 mg into the skin every 6 (six) months.    [provider]   Allergies  Allergen Reactions   Tylenol [Acetaminophen] Rash   Review of Systems  Unable to perform ROS: Acuity of condition    Physical Exam Constitutional:      Appearance: He is ill-appearing.     Interventions: Nasal cannula in place.  Cardiovascular:     Rate and Rhythm: Normal rate.  Pulmonary:     Effort: Tachypnea present.  Musculoskeletal:     Comments: Generalized weakness  Skin:    General: Skin is warm and dry.  Neurological:     Mental Status: He is lethargic.     Vital Signs: BP 133/83 (BP Location: Left Arm)   Pulse  100   Temp 98.8 F (37.1 C)  Resp 18   Ht _0  (1.778 m)   Wt 77.1 kg   SpO2 94%   BMI 24.39 kg/m  Pain Scale: 0-10 POSS *See Group Information*: 1-Acceptable,Awake and alert Pain Score: 0-No pain   SpO2: SpO2: 94 % O2 Device:SpO2: 94 % O2 Flow Rate: .O2 Flow Rate (L/min): 4 L/min  IO: Intake/output summary:  Intake/Output Summary (Last 24 hours) at 03/29/2022 0934 Last data filed at 03/29/2022 0255 Gross per 24 hour  Intake 180 ml  Output 850 ml  Net -670 ml    LBM: Last BM Date : 03/23/22 Baseline Weight: Weight: 77.1 kg Most recent weight: Weight: 77.1 kg     Palliative Assessment/Data: 40 % at best   Discussed with treatment team   Time In: 0830 Time Out: 1000 Time Total: 90 minutes Greater than 50%  of this time was spent counseling and coordinating care related to the above assessment and plan.  Signed by: Wadie Lessen, NP   Please contact Palliative Medicine Team phone at [phone] for questions and concerns.  For individual provider: See Shea Evans

## 2022-03-29 NOTE — Telephone Encounter (Signed)
Pharmacy Patient Advocate Encounter  Insurance verification completed.    The patient is insured through [employer] [place]   The patient is currently admitted and ran test claims for the following: Eliquis 5mg .  Copays and coinsurance results were relayed to Inpatient clinical team.

## 2022-03-29 NOTE — Progress Notes (Signed)
OT Cancellation Note  Patient Details Name: SENICA CRALL MRN: [id number] DOB: 1926/08/04   Cancelled Treatment:    Reason Eval/Treat Not Completed: Patient declined, no reason specified. OT orders received, chart reviewed. Son present upon arrival and reporting that pt has not received breakfast yet. Requested that OT come back after pt has had breakfast. Will re-attempt as able.  Doneta Public 03/29/2022, 9:23 AM

## 2022-03-29 NOTE — Progress Notes (Addendum)
[place] NOTE       Patient ID: Eric Hicks MRN: [id number] DOB/AGE: 86-Nov-1928 86 y.o.  Admit date: 03/23/2022 Referring Physician Dr. Lorella Nimrod Primary Physician Dr. Deborra Medina Primary Cardiologist Dr. Saralyn Pilar Reason for Consultation acute on chronic CHF  HPI: Eric Hicks is a 28yoM with a PMH of chronic atrial fibrillation (not on [facility]), HFimprovedEF (55-60%, g1DD, mild MR, mi-mod AR 02/2022, previously 30-35%), COPD, CKD 3, who presented to [facility] ED 03/23/22 after a mechanical fall, fracturing his right hip. He underwent R hemiarthroplasty on 11/29. Overnight on 12/1 he was in respiratory distress, requiring HHFNC. VQ scan with acute PE, BNP also elevated to 1900. He diuresed well and was started on eliquis for tx of PE, O2 weaned to 4L [place].  Interval history:  -weaned to 4L, some issues with dysphagia and diet changed to puree -also some issues with delirium/confusion at night and daytime sleepiness -no chest pain, palpitations. Some dyspnea with transferring from bed to chair -family notes he is prescribed lasix 9m PRN, he previously lived alone and is not sure whether he is taking this or not  Review of systems complete and found to be negative unless listed above     Past Medical History:  Diagnosis Date   BPH (benign prostatic hyperplasia)    managed by DOlena Heckle  [place]    Elevated PSA, between 10 and less than 20 ng/ml    DOlena Heckle watchful waiting   Hypertension    Osteoporosis    by DEXA    Past Surgical History:  Procedure Laterality Date   HIP ARTHROPLASTY Right 03/24/2022   Procedure: ARTHROPLASTY BIPOLAR HIP (HEMIARTHROPLASTY);  Surgeon: ASteffanie Rainwater MD;  Location: [facility] ORS;  Service: Orthopedics;  Laterality: Right;   TYMPANOSTOMY TUBE PLACEMENT  2017    Medications Prior to Admission  Medication Sig Dispense Refill Last Dose   ADVAIR DISKUS 100-50 MCG/ACT AEPB INHALE 1  PUFF INTO THE LUNGS TWICE A DAY 180 each 1 03/23/2022   Aflibercept (EYLEA IO) Inject into the eye.   Past Month   AMBULATORY NON FORMULARY MEDICATION Joint Advantage Gold 2 tablets daily   03/23/2022   atorvastatin (LIPITOR) 20 MG tablet TAKE 1 TABLET BY MOUTH EVERY OTHER DAY 45 tablet 3 03/22/2022   calcium carbonate (OS-CAL) 600 MG TABS Take 600 mg by mouth daily with breakfast.    03/23/2022   carvedilol (COREG) 6.25 MG tablet Take 6.25 mg by mouth 2 (two) times daily.   03/23/2022   dorzolamide-timolol (COSOPT) 22.3-6.8 MG/ML ophthalmic solution TAKE 1 DROP(S) IN RIGHT EYE 2 TIMES A DAY  5 03/23/2022   doxazosin (CARDURA) 1 MG tablet TAKE 1 TABLET BY MOUTH EVERY DAY 90 tablet 1 03/23/2022   furosemide (LASIX) 20 MG tablet TAKE 2 TABLETS BY MOUTH DAILY AS NEEDED 180 tablet 1 03/23/2022   latanoprost (XALATAN) 0.005 % ophthalmic solution Place 1 drop into both eyes at bedtime.  5 Past Month   Vitamin D, Cholecalciferol, 25 MCG (1000 UT) CAPS Take 1 capsule by mouth daily.   03/23/2022   leuprolide, 6 Month, (ELIGARD) 45 MG injection Inject 45 mg into the skin every 6 (six) months.      Social History   Socioeconomic History   Marital status: Widowed    Spouse name: Not on file   Number of children: Not on file   Years of education: Not on file   Highest education level: Not on file  Occupational History   Not on file  Tobacco Use   Smoking status: Former   Smokeless tobacco: Never  Vaping Use   Vaping Use: Never used  Substance and Sexual Activity   Alcohol use: No   Drug use: Not on file   Sexual activity: Never  Other Topics Concern   Not on file  Social History Narrative   Regular exercise-yew   Social Determinants of Health   Financial Resource Strain: Low Risk  (10/07/2021)   Overall Financial Resource Strain (CARDIA)    Difficulty of Paying Living Expenses: Not hard at all  Food Insecurity: No Food Insecurity (03/24/2022)   Hunger Vital Sign    Worried About  Running Out of Food in the Last Year: Never true    Ran Out of Food in the Last Year: Never true  Transportation Needs: No Transportation Needs (03/24/2022)   PRAPARE - Hydrologist (Medical): No    Lack of Transportation (Non-Medical): No  Physical Activity: Not on file  Stress: No Stress Concern Present (10/07/2021)   [place]    Feeling of Stress : Not at all  Social Connections: Unknown (10/07/2021)   Social Connection and Isolation Panel [NHANES]    Frequency of Communication with Friends and Family: More than three times a week    Frequency of Social Gatherings with Friends and Family: Not on file    Attends Religious Services: Not on file    Active Member of Clubs or Organizations: Not on file    Attends Archivist Meetings: Not on file    Marital Status: Widowed  Intimate Partner Violence: Not At Risk (03/24/2022)   Humiliation, Afraid, Rape, and Kick questionnaire    Fear of Current or Ex-Partner: No    Emotionally Abused: No    Physically Abused: No    Sexually Abused: No    Family History  Problem Relation Age of Onset   Diabetes Mother    Hypertension Father    Cancer Brother 11       multiple myeloma      Intake/Output Summary (Last 24 hours) at 03/29/2022 1310 Last data filed at 03/29/2022 0255 Gross per 24 hour  Intake 180 ml  Output 850 ml  Net -670 ml    Vitals:   03/29/22 0253 03/29/22 0400 03/29/22 0745 03/29/22 1100  BP: (!) 153/83 123/84 133/83   Pulse: 82  100 78  Resp: (!) 21  18   Temp: 97.9 F (36.6 C)  98.8 F (37.1 C)   TempSrc: Oral     SpO2: 96%  94% 100%  Weight:      Height:        PHYSICAL EXAM General: elderly and frail caucasian male , well nourished, in no acute distress. HEENT:  Normocephalic and atraumatic. Hard of hearing Neck:  No JVD.  Lungs: Normal respiratory effort on 4L [place]. Clear bilaterally to auscultation. No  wheezes, crackles, rhonchi.  Heart: irregularly irregular with controlled rate . Normal S1 and S2 without gallops or murmurs.  Abdomen: Non-distended appearing.  Msk: Normal strength and tone for age. Extremities: Warm and well perfused. No clubbing, cyanosis. 1+ RLE edema and trace LLE edema. Improving, chronic venous stasis hyperpigmentation Neuro: fatigued, but answers simple questions Psych:  Answers questions appropriately.   Labs: Basic Metabolic Panel: Recent Labs    03/27/22 0410 03/28/22 0446  NA 141 142  K 4.6 4.7  CL 109  109  CO2 27 27  GLUCOSE 126* 127*  BUN 49* 41*  CREATININE 1.58* 1.33*  CALCIUM 8.5* 8.7*  PHOS  --  2.6   Liver Function Tests: Recent Labs    03/28/22 0446  ALBUMIN 2.5*   No results for input(s): "LIPASE", "AMYLASE" in the last 72 hours. CBC: Recent Labs    03/27/22 0410 03/28/22 0443  WBC 8.7 8.0  HGB 10.6* 10.3*  HCT 33.0* 32.1*  MCV 100.0 100.6*  PLT 108* 121*   Cardiac Enzymes: No results for input(s): "CKTOTAL", "CKMB", "CKMBINDEX", "TROPONINIHS" in the last 72 hours. BNP: No results for input(s): "BNP" in the last 72 hours. D-Dimer: No results for input(s): "DDIMER" in the last 72 hours. Hemoglobin A1C: No results for input(s): "HGBA1C" in the last 72 hours. Fasting Lipid Panel: No results for input(s): "CHOL", "HDL", "LDLCALC", "TRIG", "CHOLHDL", "LDLDIRECT" in the last 72 hours. Thyroid Function Tests: No results for input(s): "TSH", "T4TOTAL", "T3FREE", "THYROIDAB" in the last 72 hours.  Invalid input(s): "FREET3" Anemia Panel: No results for input(s): "VITAMINB12", "FOLATE", "FERRITIN", "TIBC", "IRON", "RETICCTPCT" in the last 72 hours.   Radiology: ECHOCARDIOGRAM COMPLETE  Result Date: 03/26/2022    ECHOCARDIOGRAM REPORT   Patient Name:   THOMSON HERBERS Shreiner Date of Exam: 03/26/2022 Medical Rec #:  [id number]    Height:       70.0 in Accession #:    [id number]   Weight:       170.0 lb Date of Birth:  Dec 28, 1926    BSA:           1.948 m Patient Age:    69 years     BP:           126/75 mmHg Patient Gender: M            HR:           84 bpm. Exam Location:  [facility] Procedure: 2D Echo, Cardiac Doppler and Color Doppler Indications:     Abnormal ECG R94.31  History:         Patient has prior history of Echocardiogram examinations, most                  recent 06/29/2018. COPD; Risk Factors:Hypertension.  Sonographer:     Sherrie Sport Referring Phys:  [id number] [facility] AMIN Diagnosing Phys: Yolonda Kida MD  Sonographer Comments: Image quality was good. IMPRESSIONS  1. Left ventricular ejection fraction, by estimation, is 55 to 60%. The left ventricle has normal function. The left ventricle has no regional wall motion abnormalities. There is mild concentric left ventricular hypertrophy. Left ventricular diastolic parameters are consistent with Grade I diastolic dysfunction (impaired relaxation).  2. Right ventricular systolic function is normal. The right ventricular size is normal.  3. Left atrial size was mildly dilated.  4. Right atrial size was mildly dilated.  5. The mitral valve is grossly normal. Mild mitral valve regurgitation.  6. The aortic valve is calcified. Aortic valve regurgitation is mild to moderate. FINDINGS  Left Ventricle: Left ventricular ejection fraction, by estimation, is 55 to 60%. The left ventricle has normal function. The left ventricle has no regional wall motion abnormalities. The left ventricular internal cavity size was normal in size. There is  mild concentric left ventricular hypertrophy. Left ventricular diastolic parameters are consistent with Grade I diastolic dysfunction (impaired relaxation). Right Ventricle: The right ventricular size is normal. No increase in right ventricular wall thickness. Right ventricular systolic function is normal. Left Atrium: Left atrial  size was mildly dilated. Right Atrium: Right atrial size was mildly dilated. Pericardium: There is no evidence of pericardial effusion. Mitral  Valve: The mitral valve is grossly normal. There is mild thickening of the mitral valve leaflet(s). There is mild calcification of the mitral valve leaflet(s). Mildly decreased mobility of the mitral valve leaflets. Mild mitral annular calcification. Mild mitral valve regurgitation. Tricuspid Valve: The tricuspid valve is normal in structure. Tricuspid valve regurgitation is trivial. Aortic Valve: The aortic valve is calcified. Aortic valve regurgitation is mild to moderate. Aortic regurgitation PHT measures 396 msec. Aortic valve mean gradient measures 3.0 mmHg. Aortic valve peak gradient measures 5.5 mmHg. Aortic valve area, by VTI  measures 3.22 cm. Pulmonic Valve: The pulmonic valve was normal in structure. Pulmonic valve regurgitation is not visualized. Aorta: The ascending aorta was not well visualized. IAS/Shunts: No atrial level shunt detected by color flow Doppler.  LEFT VENTRICLE PLAX 2D LVOT diam:     2.20 cm LV SV:         55 LV SV Index:   28 LVOT Area:     3.80 cm  RIGHT VENTRICLE RV Basal diam:  4.60 cm RV Mid diam:    3.90 cm RV S prime:     11.40 cm/s TAPSE (M-mode): 1.2 cm LEFT ATRIUM             Index        RIGHT ATRIUM           Index LA diam:        4.10 cm 2.10 cm/m   RA Area:     28.00 cm LA Vol (A2C):   64.8 ml 33.26 ml/m  RA Volume:   83.20 ml  42.71 ml/m LA Vol (A4C):   48.5 ml 24.90 ml/m LA Biplane Vol: 57.0 ml 29.26 ml/m  AORTIC VALVE AV Area (Vmax):    2.98 cm AV Area (Vmean):   2.85 cm AV Area (VTI):     3.22 cm AV Vmax:           117.00 cm/s AV Vmean:          81.500 cm/s AV VTI:            0.171 m AV Peak Grad:      5.5 mmHg AV Mean Grad:      3.0 mmHg LVOT Vmax:         91.80 cm/s LVOT Vmean:        61.000 cm/s LVOT VTI:          0.145 m LVOT/AV VTI ratio: 0.85 AI PHT:            396 msec  AORTA Ao Root diam: 3.90 cm MITRAL VALVE               TRICUSPID VALVE MV Area (PHT): 4.99 cm    TR Peak grad:   28.1 mmHg MV Decel Time: 152 msec    TR Vmax:        265.00 cm/s MV E  velocity: 81.80 cm/s                            SHUNTS                            Systemic VTI:  0.14 m  Systemic Diam: 2.20 cm Yolonda Kida MD Electronically signed by Yolonda Kida MD Signature Date/Time: 03/26/2022/1:43:34 PM    Final    US RENAL  Result Date: 03/26/2022 CLINICAL DATA:  Acute kidney injury EXAM: RENAL / URINARY TRACT ULTRASOUND COMPLETE COMPARISON:  None available FINDINGS: Right Kidney: Renal measurements: 9.3 x 5.1 x 4.6 cm = volume: 116 mL. Mild diffuse cortical thinning. No mass or hydronephrosis visualized. 9 mm nonobstructing calculus present in the upper pole. Left Kidney: Renal measurements: 10.7 x 5.8 x 3.9 cm = volume: 127 mL. Mild diffuse cortical thinning. No mass or hydronephrosis visualized. 1.6 cm simple cyst in the upper pole does not require further imaging follow-up. Bladder: Unable to evaluate due to collapse configuration. Other: None. IMPRESSION: 1. No hydronephrosis. 2. Mild diffuse cortical thinning of the kidneys consistent with chronic renal failure. 3. 9 mm nonobstructing calculus in the upper pole of the right kidney. Electronically Signed   By: Miachel Roux M.D.   On: 03/26/2022 11:31   NM Pulmonary Perfusion  Result Date: 03/26/2022 CLINICAL DATA:  Concern for pulmonary embolism. Respiratory distress. Worsening respiratory distress. Rapid breathing EXAM: NUCLEAR MEDICINE PERFUSION LUNG SCAN TECHNIQUE: Perfusion images were obtained in multiple projections after intravenous injection of radiopharmaceutical. RADIOPHARMACEUTICALS:  4.5 mCi Tc-29mMAA COMPARISON:  Chest radiograph 03/25/2022, CT 01/12/2022 insert FINDINGS: Wedge-shaped peripheral perfusion defect within LEFT upper lobe is not matched on comparison chest radiograph. Small perfusion defect within the lateral aspect of the RIGHT upper lobe is also unmatched. More central RIGHT upper lobe perfusion defect corresponds to mass on comparison radiograph. regional  decreased perfusion to the LEFT lower lobe. IMPRESSION: Unmatched wedge-shaped peripheral perfusion defects in the upper lobes are most consistent acute pulmonary emboli. These results will be called to the ordering clinician or representative by the Radiologist Assistant, and communication documented in the PACS or [employer] Electronically Signed   By: SSuzy BouchardM.D.   On: 03/26/2022 11:26   DG Chest Port 1 View  Result Date: 03/25/2022 CLINICAL DATA:  Hypoxia, history of right upper lobe lung cancer EXAM: PORTABLE CHEST 1 VIEW COMPARISON:  03/24/2022, 01/12/2022 FINDINGS: Single frontal view of the chest demonstrates persistent enlargement of the cardiac silhouette. No change in the central vascular congestion, bilateral perihilar airspace disease, and left pleural effusion. Stable right upper lobe consolidation consistent with history of lung cancer and radiation therapy. No acute bony abnormalities. IMPRESSION: 1. Stable congestive heart failure. 2. Persistent right upper lobe consolidation and scarring, consistent with known history of lung cancer and prior radiation therapy. Electronically Signed   By: MRanda NgoM.D.   On: 03/25/2022 16:10   DG Chest Port 1 View  Result Date: 03/24/2022 CLINICAL DATA:  Hypoxia EXAM: PORTABLE CHEST 1 VIEW COMPARISON:  03/23/2022 FINDINGS: Cardiac shadow is enlarged but stable. Increasing vascular congestion with interstitial edema is noted. No sizable effusion is seen. No bony abnormality is noted. IMPRESSION: Increasing CHF. Electronically Signed   By: MInez CatalinaM.D.   On: 03/24/2022 20:56   DG Pelvis Portable  Result Date: 03/24/2022 CLINICAL DATA:  Right hip replacement EXAM: PORTABLE PELVIS 1-2 VIEWS COMPARISON:  None Available. FINDINGS: Orthopedic view of the pelvis demonstrates surgical changes of right hip bipolar hemiarthroplasty. Periarticular gas is likely postsurgical in nature. Normal alignment. No unexpected fracture or  dislocation. IMPRESSION: 1. Right hip bipolar hemiarthroplasty. Electronically Signed   By: AFidela SalisburyM.D.   On: 03/24/2022 18:31   DG FEMUR, MIN 2 VIEWS RIGHT  Result Date: 03/24/2022 CLINICAL DATA:  Closed right hip fracture. EXAM: RIGHT FEMUR 2 VIEWS COMPARISON:  Pelvis and right hip radiographs 03/23/2022 FINDINGS: There is diffuse decreased bone mineralization. Redemonstration of mildly impacted proximal right femoral neck fracture. There is mild anterior apex angulation and likely approximately 8 mm anterior displacement of the distal fracture component with respect to the proximal fracture component. Mild-to-moderate right femoroacetabular joint space narrowing. Mild tricompartmental osteoarthritis of the right knee. Vascular phleboliths overlie the pelvis. Mild-to-moderate arterial vascular calcifications. IMPRESSION: Acute mildly impacted and angulated proximal right femoral neck fracture, similar to prior. Electronically Signed   By: Yvonne Kendall M.D.   On: 03/24/2022 08:40   CT Head Wo Contrast  Result Date: 03/23/2022 CLINICAL DATA:  Head trauma, minor (Age >= 65y); Neck trauma (Age >= 65y) EXAM: CT HEAD WITHOUT CONTRAST CT CERVICAL SPINE WITHOUT CONTRAST TECHNIQUE: Multidetector CT imaging of the head and cervical spine was performed following the standard protocol without intravenous contrast. Multiplanar CT image reconstructions of the cervical spine were also generated. RADIATION DOSE REDUCTION: This exam was performed according to the departmental dose-optimization program which includes automated exposure control, adjustment of the mA and/or kV according to patient size and/or use of iterative reconstruction technique. COMPARISON:  CT chest 01/12/2022, CT chest 01/09/2021 FINDINGS: CT HEAD FINDINGS Brain: Cerebral ventricle sizes are concordant with the degree of cerebral volume loss. Patchy and confluent areas of decreased attenuation are noted throughout the deep and  periventricular white matter of the cerebral hemispheres bilaterally, compatible with chronic microvascular ischemic disease. No evidence of large-territorial acute infarction. No parenchymal hemorrhage. No mass lesion. No extra-axial collection. No mass effect or midline shift. No hydrocephalus. Basilar cisterns are patent. Vascular: No hyperdense vessel. Atherosclerotic calcifications are present within the cavernous internal carotid and vertebral arteries. Skull: No acute fracture or focal lesion. Sinuses/Orbits: Paranasal sinuses and mastoid air cells are clear. Bilateral lens replacement. Otherwise the orbits are unremarkable. Other: None. CT CERVICAL SPINE FINDINGS Alignment: Normal. Skull base and vertebrae: Multilevel severe degenerative changes spine most prominent at the C1-C2 and C5-C6 levels. Associated moderate to severe osseous neural foraminal stenosis at the left C3-C4 level. No acute fracture. No aggressive appearing focal osseous lesion or focal pathologic process. Soft tissues and spinal canal: No prevertebral fluid or swelling. No visible canal hematoma. Upper chest: Partially visualized right apical masslike airspace opacity measuring up to at least 4.2 cm. Other: None. IMPRESSION: 1. No acute intracranial abnormality. 2. No acute displaced fracture or traumatic listhesis of the cervical spine. 3. Partially visualized right apical masslike airspace opacity measuring up to at least 4.2 cm. 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 Electronically Signed   By: Iven Finn M.D.   On: 03/23/2022 20:56   CT Cervical Spine Wo Contrast  Result Date: 03/23/2022 CLINICAL DATA:  Head trauma, minor (Age >= 65y); Neck trauma (Age >= 65y) EXAM: CT HEAD WITHOUT CONTRAST CT CERVICAL SPINE WITHOUT CONTRAST TECHNIQUE: Multidetector CT imaging of the head and cervical spine was performed following the standard protocol without  intravenous contrast. Multiplanar CT image reconstructions of the cervical spine were also generated. RADIATION DOSE REDUCTION: This exam was performed according to the departmental dose-optimization program which includes automated exposure control, adjustment of the mA and/or kV according to patient size and/or use of iterative reconstruction technique. COMPARISON:  CT chest 01/12/2022, CT chest 01/09/2021 FINDINGS: CT HEAD FINDINGS Brain: Cerebral ventricle sizes are concordant with the degree of cerebral  volume loss. Patchy and confluent areas of decreased attenuation are noted throughout the deep and periventricular white matter of the cerebral hemispheres bilaterally, compatible with chronic microvascular ischemic disease. No evidence of large-territorial acute infarction. No parenchymal hemorrhage. No mass lesion. No extra-axial collection. No mass effect or midline shift. No hydrocephalus. Basilar cisterns are patent. Vascular: No hyperdense vessel. Atherosclerotic calcifications are present within the cavernous internal carotid and vertebral arteries. Skull: No acute fracture or focal lesion. Sinuses/Orbits: Paranasal sinuses and mastoid air cells are clear. Bilateral lens replacement. Otherwise the orbits are unremarkable. Other: None. CT CERVICAL SPINE FINDINGS Alignment: Normal. Skull base and vertebrae: Multilevel severe degenerative changes spine most prominent at the C1-C2 and C5-C6 levels. Associated moderate to severe osseous neural foraminal stenosis at the left C3-C4 level. No acute fracture. No aggressive appearing focal osseous lesion or focal pathologic process. Soft tissues and spinal canal: No prevertebral fluid or swelling. No visible canal hematoma. Upper chest: Partially visualized right apical masslike airspace opacity measuring up to at least 4.2 cm. Other: None. IMPRESSION: 1. No acute intracranial abnormality. 2. No acute displaced fracture or traumatic listhesis of the cervical spine.  3. Partially visualized right apical masslike airspace opacity measuring up to at least 4.2 cm. 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 Electronically Signed   By: Iven Finn M.D.   On: 03/23/2022 20:56   DG Elbow Complete Right  Result Date: 03/23/2022 CLINICAL DATA:  Fall EXAM: RIGHT ELBOW - COMPLETE 4 VIEW COMPARISON:  None Available. FINDINGS: There is no evidence of fracture, dislocation, or joint effusion. There is no evidence of arthropathy or other focal bone abnormality. Soft tissues are unremarkable. IMPRESSION: Negative. Electronically Signed   By: Sammie Bench M.D.   On: 03/23/2022 20:55   DG Chest 1 View  Result Date: 03/23/2022 CLINICAL DATA:  Fall. EXAM: CHEST  1 VIEW COMPARISON:  January 16, 2019. FINDINGS: Stable cardiomegaly. Both lungs are clear. The visualized skeletal structures are unremarkable. IMPRESSION: No active disease. Electronically Signed   By: Marijo Conception M.D.   On: 03/23/2022 20:23   DG Hip Unilat W or Wo Pelvis 2-3 Views Right  Result Date: 03/23/2022 CLINICAL DATA:  Right hip pain after fall. EXAM: DG HIP (WITH OR WITHOUT PELVIS) 2-3V RIGHT COMPARISON:  April 11, 2014. FINDINGS: Mildly impacted right femoral neck fracture is noted. Left hip is unremarkable. IMPRESSION: Mildly impacted right femoral neck fracture Electronically Signed   By: Marijo Conception M.D.   On: 03/23/2022 20:22    TELEMETRY reviewed by me (LT) 03/29/2022 : AF 90s-100s  EKG reviewed by me: none available   Data reviewed by me (LT) 03/29/2022: hospitalist progress note, ortho note, cbc, bmp, vitals tele I/o    Principal Problem:   Acute respiratory failure with hypoxia (HCC) Active Problems:   BPH (benign prostatic hyperplasia)   White coat syndrome with diagnosis of hypertension   Nonischemic dilated cardiomyopathy (HCC)   Paroxysmal atrial fibrillation (HCC)   Closed right hip fracture  (HCC)   Stage 3a chronic kidney disease (CKD) ([place])   Hyperlipidemia   Acute pulmonary embolism ([place])    ASSESSMENT AND PLAN:  Eric Hicks is a 89yoM with a PMH of chronic atrial fibrillation (not on AC), HFimprovedEF (55-60%, g1DD, mild MR, mi-mod AR 02/2022, previously 30-35%), COPD, CKD 3, who presented to [facility] ED 03/23/22 after a mechanical fall, fracturing his right hip. He underwent R hemiarthroplasty on 11/29. Overnight  on 12/1 he was in respiratory distress, requiring HHFNC. VQ scan with acute PE, BNP also elevated to 1900. He diuresed well and was started on eliquis for tx of PE, O2 weaned to 4L [place].  # mechanical fall  # R femoral neck fx s/p hemiarthropalsty  -management per ortho surgery  # acute hypoxic respiratory failure # acute PE  # AoCHF (improved HF)  Now on eliquis per pharmacy, O2 weaned from [facility] to 4L [place]. Diuresed well on IV lasix.  -continue to wean O2 as tolerated.  -ordered 12m PO lasix daily. Continue coreg 6.259mBID. -defer ACEi/ARB, spiro with renal insufficiency  # persistent AF  Rate controlled on coreg. Previously declined AC. CHADS2-VASC 6 -continue eliquis as above for tx of PE  # demand ischemia Troponins elevated but with a flat trend, in the setting of acute PE this is most consistent with demand/supply mismatch and not ACS   This patient's plan of care was discussed and created with Dr. CaClayborn Bignessnd he is in agreement.  Signed: LiTristan Schroeder PA-C 03/29/2022, 1:10 PM [facility]

## 2022-03-29 NOTE — Progress Notes (Signed)
Physical Therapy Treatment Patient Details Name: Eric Hicks MRN: [id number] DOB: [birth year] Today's Date: 03/29/2022   History of Present Illness Pt presented to ER secondary to mechanical fall with acute onset of R hip/elbow pain; admitted for management of closed R hip fracture, s/p R hip hemiarthroplasty (03/24/22).  Pt with rapid response 11/30 and transferred to PCU; pt noted with PE 12/1.    PT Comments    PT/OT co-treatment performed.  During session pt min assist x2 with transfers (from mildly elevated bed height) and ambulating a few feet bed to recliner with RW use.  Pt's O2 sensor with poor pleth/reading during mobility d/t being on pt's toe (reading in 70's for O2 did not appear accurate) but immediately after sitting down pt's O2 sats noted to be 95% on 4 L O2 via nasal cannula via separate O2 sensor.  Pt appearing lethargic in bed (intermittently sleeping) but did well with alertness with consistent activity; pt also demonstrate SOB and fatigue during session but appearing motivated to participate.  Family currently wants continued therapy; continue to recommend SNF.   Recommendations for follow up therapy are one component of a multi-disciplinary discharge planning process, led by the attending physician.  Recommendations may be updated based on patient status, additional functional criteria and insurance authorization.  Follow Up Recommendations  Skilled nursing-short term rehab (<3 hours/day) Can patient physically be transported by private vehicle: No   Assistance Recommended at Discharge Frequent or constant Supervision/Assistance  Patient can return home with the following Two people to help with walking and/or transfers;Two people to help with bathing/dressing/bathroom;Assistance with cooking/housework;Assist for transportation;Help with stairs or ramp for entrance   Equipment Recommendations  Rolling walker (2 wheels);BSC/3in1;Wheelchair (measurements PT);Wheelchair  cushion (measurements PT)    Recommendations for Other Services       Precautions / Restrictions Precautions Precautions: Fall;Posterior Hip Restrictions Weight Bearing Restrictions: Yes RLE Weight Bearing: Weight bearing as tolerated     Mobility  Bed Mobility Overal bed mobility:  (Deferred (OT assisted pt with sitting on edge of bed))                  Transfers Overall transfer level: Needs assistance Equipment used: Rolling walker (2 wheels) Transfers: Sit to/from Stand, Bed to chair/wheelchair/BSC Sit to Stand: Min assist, +2 physical assistance, From elevated surface           General transfer comment: vc's for UE placement; assist to initiate stand up to RW from mildly elevated bed; increased time and cueing for pt to sit down in recliner (assist to control descent sitting)    Ambulation/Gait Ambulation/Gait assistance: Min assist, +2 physical assistance Gait Distance (Feet): 3 Feet (bed to recliner) Assistive device: Rolling walker (2 wheels)   Gait velocity: decreased     General Gait Details: antalgic; decreased stance time R LE; vc's for walker use and gait technique; increased effort/time to take steps   Stairs             Wheelchair Mobility    Modified Rankin (Stroke Patients Only)       Balance Overall balance assessment: Needs assistance Sitting-balance support: Bilateral upper extremity supported, Feet supported Sitting balance-Leahy Scale: Fair Sitting balance - Comments: steady static sitting but L lean noted (d/t R hip discomfort) Postural control: Left lateral lean Standing balance support: Bilateral upper extremity supported, Reliant on assistive device for balance Standing balance-Leahy Scale: Fair Standing balance comment: steady static standing with B UE support on RW  Cognition Arousal/Alertness: Awake/alert Behavior During Therapy: WFL for tasks assessed/performed Overall  Cognitive Status: Within Functional Limits for tasks assessed                                          Exercises      General Comments       Pertinent Vitals/Pain Pain Assessment Pain Assessment: Faces Faces Pain Scale: Hurts little more Pain Location: R hip Pain Descriptors / Indicators: Aching, Grimacing, Guarding, Sore Pain Intervention(s): Limited activity within patient's tolerance, Monitored during session, Repositioned HR WFL during sessions activities.    Home Living          Prior Function            PT Goals (current goals can now be found in the care plan section) Acute Rehab PT Goals Patient Stated Goal: to go back home when ready PT Goal Formulation: With patient/family Time For Goal Achievement: 04/11/22 Potential to Achieve Goals: Fair Progress towards PT goals: Progressing toward goals    Frequency    7X/week      PT Plan Current plan remains appropriate    Co-evaluation PT/OT/SLP Co-Evaluation/Treatment: Yes Reason for Co-Treatment: For patient/therapist safety;To address functional/ADL transfers PT goals addressed during session: Mobility/safety with mobility;Proper use of DME OT goals addressed during session: ADL's and self-care      AM-PAC PT "6 Clicks" Mobility   Outcome Measure  Help needed turning from your back to your side while in a flat bed without using bedrails?: A Lot Help needed moving from lying on your back to sitting on the side of a flat bed without using bedrails?: A Lot Help needed moving to and from a bed to a chair (including a wheelchair)?: Total Help needed standing up from a chair using your arms (e.g., wheelchair or bedside chair)?: Total Help needed to walk in hospital room?: Total Help needed climbing 3-5 steps with a railing? : Total 6 Click Score: 8    End of Session Equipment Utilized During Treatment: Gait belt Activity Tolerance: Patient limited by fatigue Patient left: in  chair;with call bell/phone within reach;with chair alarm set;with family/visitor present;with SCD's reapplied;Other (comment) (pillow between pt's knees for posterior THP's and B heels floating via pillow support) Nurse Communication: Mobility status;Precautions;Weight bearing status PT Visit Diagnosis: Other abnormalities of gait and mobility (R26.89);Difficulty in walking, not elsewhere classified (R26.2);Pain;Muscle weakness (generalized) (M62.81) Pain - Right/Left: Right Pain - part of body: Hip     Time: 8185-6314 PT Time Calculation (min) (ACUTE ONLY): 25 min  Charges:  $Therapeutic Activity: 23-37 mins                     Leitha Bleak, PT 03/29/22, 3:11 PM

## 2022-03-29 NOTE — Progress Notes (Signed)
   Subjective: 5 Days Post-Op Procedure(s) (LRB): ARTHROPLASTY BIPOLAR HIP (HEMIARTHROPLASTY) (Right) Patient sleeping this am. Easily awakened. Patient denies and complaints. Right hip pain well controlled. Plan is to go Skilled nursing facility after hospital stay.  Objective: Vital signs in last 24 hours: Temp:  [97.7 F (36.5 C)-98.8 F (37.1 C)] 98.8 F (37.1 C) (12/04 0745) Pulse Rate:  [47-100] 100 (12/04 0745) Resp:  [18-23] 18 (12/04 0745) BP: (121-153)/(70-85) 133/83 (12/04 0745) SpO2:  [94 %-98 %] 94 % (12/04 0745)  Intake/Output from previous day: 12/03 0701 - 12/04 0700 In: 180 [P.O.:180] Out: 850 [Urine:850] Intake/Output this shift: No intake/output data recorded.  Recent Labs    03/27/22 0410 03/28/22 0443  HGB 10.6* 10.3*   Recent Labs    03/27/22 0410 03/28/22 0443  WBC 8.7 8.0  RBC 3.30* 3.19*  HCT 33.0* 32.1*  PLT 108* 121*   Recent Labs    03/27/22 0410 03/28/22 0446  NA 141 142  K 4.6 4.7  CL 109 109  CO2 27 27  BUN 49* 41*  CREATININE 1.58* 1.33*  GLUCOSE 126* 127*  CALCIUM 8.5* 8.7*   No results for input(s): "LABPT", "INR" in the last 72 hours.   EXAM General - Patient is Alert, Appropriate, and Oriented. No distress. Extremity - Neurovascular intact Sensation intact distally Intact pulses distally Dorsiflexion/Plantar flexion intact No swelling in the lower extremities.  Negative Homans' sign bilaterally. Dressing - dressing C/D/I and no drainage Motor Function - intact, moving foot and toes well on exam.   Past Medical History:  Diagnosis Date   BPH (benign prostatic hyperplasia)    managed by Olena Heckle   COPD (chronic obstructive pulmonary disease) ([place])    Elevated PSA, between 10 and less than 20 ng/ml    Olena Heckle, watchful waiting   Hypertension    Osteoporosis    by DEXA    Assessment/Plan:   5 Days Post-Op Procedure(s) (LRB): ARTHROPLASTY BIPOLAR HIP (HEMIARTHROPLASTY) (Right) Principal Problem:   Acute  respiratory failure with hypoxia (HCC) Active Problems:   BPH (benign prostatic hyperplasia)   White coat syndrome with diagnosis of hypertension   Nonischemic dilated cardiomyopathy (HCC)   Paroxysmal atrial fibrillation (HCC)   Closed right hip fracture (HCC)   Stage 3a chronic kidney disease (CKD) ([place])   Hyperlipidemia   Acute pulmonary embolism ([place])  Estimated body mass index is 24.39 kg/m as calculated from the following:   Height as of this encounter: 5\' 10"  (1.778 m).   Weight as of this encounter: 77.1 kg. Advance diet Up with therapy Work on BM RIght hip pain well controlled Vital signs are stable Pulmonary Embolism - on Eliquis.  Palliative care involved  CM to assist with discharge.  Patient will need 2 week follow up with [facility] ortho    Weight-Bearing as tolerated to right leg   T. Rachelle Hora, PA-C [place] 03/29/2022, 10:33 AM

## 2022-03-29 NOTE — Plan of Care (Signed)
Eric Hicks is more awake after his son leaves around midnight requesting to get up and go home. He is alert to self, place, and situation, unable to tell time. He denies any pain or discomfort, only w/ facial grimacing with turns.   POC: Continue w/ frequent round and redirect. Safety precations in placed.    Problem: Education: Goal: Knowledge of General Education information will improve Description: Including pain rating scale, medication(s)/side effects and non-pharmacologic comfort measures Outcome: Not Progressing   Problem: Health Behavior/Discharge Planning: Goal: Ability to manage health-related needs will improve Outcome: Not Progressing   Problem: Clinical Measurements: Goal: Respiratory complications will improve Outcome: Progressing   Problem: Pain Managment: Goal: General experience of comfort will improve Outcome: Progressing

## 2022-03-29 NOTE — Progress Notes (Signed)
Progress Note   Patient: Eric Hicks BSW:[id number] DOB: 04-16-1927 DOA: 03/23/2022     6 DOS: the patient was seen and examined on 03/29/2022   Brief hospital course: Mr. Sully Dyment is a 88 year old male with history of hyperlipidemia, hypertension, impaired fasting glucose, history of whitecoat syndrome, who presents emergency department for chief concerns of mechanical fall and falling on his right hip and elbow, resulted and mildly impacted right femoral neck fracture  Patient denies loss of consciousness and hitting his head.  Initial vitals in the emergency department showed temperature of 97.6, respiration rate 18, heart rate 78, blood pressure 158/106, SpO2 of 96% on room air.  Labs in the ED showed serum sodium 142, potassium 4.4, bicarb 26, BUN 28, serum creatinine 1.31, GFR 51, nonfasting glucose 122. WBC 6.6, hemoglobin 13.1, platelets 144. CT head, CT cervical spine, CT chest and EKG elbow was without any acute abnormality. DG hip with mildly impacted right femoral neck fracture  ED treatment: Tdap booster has been ordered for injection.  Oxycodone 5 mg p.o. one-time dose.  11/29: Hemodynamically stable on 3 L oxygen, labs pertinent for leukocytosis, most likely reactive, hemoglobin decreased to 12.5 from 13.1, BMP with slight worsening of BUN 31 and creatinine 1.40 which remained around his baseline of 1.3-1.4. Going to the OR with orthopedic surgery today.  11/30: Patient underwent hemiarthroplasty with orthopedic surgery on 11/29.  Required transfer Amick acid twice for bleeding.  Also received 1 dose of IV Lasix in PACU. Blood pressure mildly elevated at 142/82 this morning.  Renal function with slight deterioration to 41/1.58, GFR decreased to 41 from 47. Anemia panel with normal iron and low normal folate, B12 pending. Patient also with new oxygen requirement-history of COPD-no wheezing. Encouraging incentive spirometry and flutter valve. Chest x-ray with stable central  vascular congestion, bilateral perihilar airspace disease left pleural effusion and BNP markedly elevated at 1960.  Patient was given IV Lasix and started on regular diuresis.  Also found to have elevated troponin, patient was started on heparin infusion and cardiology was consulted.  12/1: Patient overnight with worsening respiratory status, started on heated high flow with FiO2 of 80%, which is now weaned to 55% and 45L.  D-dimer was also found to be elevated at 2.51 which also makes PE possible.  VQ scan ordered for concern of PE, trying to avoid contrast with CTA due to AKI.  VQ scan with acute PE.   Worsening renal function today with BUN 50, creatinine 1.90.  Renal ultrasound with no hydronephrosis , some cortical thinning and no other significant abnormality. nephrology was also consulted. Holding a.m. dose of Lasix. Echocardiogram with improvement of EF to 50 to 46%, grade 1 diastolic dysfunction and no regional wall motion abnormalities.  No right heart strain mentioned. I will let cardiology decide about further diuresis.  Patient might get benefit from Lasix infusion for gentle diuresis. Also sending message to vascular surgery due to worsening respiratory status.  Patient with multiorgan failure, high risk for deterioration and mortality.  Discussed with his 2 sons and daughter at bedside.  They do have some legal papers which tells that he does not want extraordinary measures like feeding tube.  Not sure about DNR, he is currently full code.  Discussed with family to decide.  Also consulting palliative care.  12/2: Patient currently hemodynamically stable with improved oxygen requirement, able to wean to 6 L.  Improving renal function with GFR of 41 and creatinine of 1.58 today. He was not  a candidate for any surgical or vascular intervention as being evaluated by vascular surgery also.  Leukocytosis resolved.  12/3: Vital seems stable, saturating in mid 90s on 4 L of oxygen.  Creatinine  improved to baseline, at 1.33.  CBC seems stable.  12/4: Patient had 1 episode of choking this morning but remained stable on 4 L of oxygen. Swallow team placed him on dysphagia diet.  Dozing off while talking and eating. Palliative care met with family and CODE STATUS changed to DNR.  Assessment and Plan: * Acute respiratory failure with hypoxia (HCC) Seems improving, he was able to weaned down to 4 L.  VQ scan with acute pulmonary embolism.  Elevated BNP and echocardiogram with improvement of his EF, no regional wall motion abnormalities and grade 1 diastolic dysfunction.  Initially treated with heparin -Continue with Eliquis  Acute pulmonary embolism (HCC) VQ scan positive for acute pulmonary embolism.  Unable to do CTA due to AKI. Worsening respiratory status. Not a candidate for any surgical intervention. Aspiratory status improving -Switch him to Eliquis  Closed right hip fracture (HCC) Secondary to mechanical fall, s/p hemiarthroplasty on 10/29. -PT/OT evaluation -Continue with pain management-try avoiding opioids.  Stage 3a chronic kidney disease (CKD) ([place]) AKI resolved.  Creatinine currently at baseline Patient developed AKI with CKD stage IIIa.   Creatinine started improving after holding Lasix. Renal ultrasound with cortical thickening and no hydronephrosis. -Monitor renal function -Avoid nephrotoxins  Nonischemic dilated cardiomyopathy ([place]) Repeat echocardiogram with improvement of EF to 50 to 55% with grade 1 diastolic dysfunction.  No regional wall motion abnormalities or right ventricular strain noted.  Troponin elevated most likely secondary to demand with acute pulmonary embolism and respiratory distress.  BNP at 1963. Patient did receive IV Lasix due to pulmonary vascular congestion and respiratory distress resulted in worsening of AKI. -Cardiology consult -Continue home carvedilol and statin -Holding further Lasix-will let nephrology and cardiology  decide  White coat syndrome with diagnosis of hypertension Blood pressure mostly within goal. -Continue carvedilol and Cardura  BPH (benign prostatic hyperplasia) -Continue Cardura  Paroxysmal atrial fibrillation (HCC) Chronic atrial fibrillation and sees [facility] cardiology - Per note on 02/11/2022, patient has declined anticoagulation therefore he is not on anticoagulation - Patient is on carvedilol 6.25 mg p.o. twice daily, this was resumed  Hyperlipidemia - Atorvastatin 20 mg daily resumed   Subjective: Patient appears little lethargic and dozing off in between conversation.  He was working with swallow team.  Michela Pitcher yes to pain but unable to explain where.  Physical Exam: Vitals:   03/29/22 0253 03/29/22 0400 03/29/22 0745 03/29/22 1100  BP: (!) 153/83 123/84 133/83   Pulse: 82  100 78  Resp: (!) 21  18   Temp: 97.9 F (36.6 C)  98.8 F (37.1 C)   TempSrc: Oral     SpO2: 96%  94% 100%  Weight:      Height:       General.  Lethargic elderly man, in no acute distress. Pulmonary.  Lungs clear bilaterally, normal respiratory effort. CV.  Regular rate and rhythm, no JVD, rub or murmur. Abdomen.  Soft, nontender, nondistended, BS positive. CNS.  Somnolent.  No focal neurologic deficit. Extremities.  No edema, no cyanosis, pulses intact and symmetrical. Psychiatry.  Judgment and insight appears normal.   Data Reviewed: Prior data reviewed  Family Communication:   Disposition: Status is: Inpatient Remains inpatient appropriate because: Severity of illness  Planned Discharge Destination: SNF  Time spent: 44 minutes  This  record has been created using Systems analyst. Errors have been sought and corrected,but may not always be located. Such creation errors do not reflect on the standard of care.   Author: Lorella Nimrod, MD 03/29/2022 1:13 PM  For on call review [email or web address].

## 2022-03-29 NOTE — Evaluation (Signed)
Clinical/Bedside Swallow Evaluation Patient Details  Name: Eric Hicks MRN: [id number] Date of Birth: 03/04/1927  Today's Date: 03/29/2022 Time: SLP Start Time (ACUTE ONLY): 1000 SLP Stop Time (ACUTE ONLY): 1100 SLP Time Calculation (min) (ACUTE ONLY): 60 min  Past Medical History:  Past Medical History:  Diagnosis Date   BPH (benign prostatic hyperplasia)    managed by Olena Heckle   COPD (chronic obstructive pulmonary disease) ([place])    Elevated PSA, between 10 and less than 20 ng/ml    Olena Heckle, watchful waiting   Hypertension    Osteoporosis    by DEXA   Past Surgical History:  Past Surgical History:  Procedure Laterality Date   HIP ARTHROPLASTY Right 03/24/2022   Procedure: ARTHROPLASTY BIPOLAR HIP (HEMIARTHROPLASTY);  Surgeon: Steffanie Rainwater, MD;  Location: [facility] ORS;  Service: Orthopedics;  Laterality: Right;   TYMPANOSTOMY TUBE PLACEMENT  2017   HPI:  Pt presented to ER secondary to mechanical fall with acute onset of R hip/elbow pain; admitted for management of closed R hip fracture, s/p R hip hemiarthroplasty (03/24/22).  PMH includes: afib; ckd; lung Ca.  Pt with rapid response 11/30 and transferred to PCU; pt noted with PE 03/26/22.  Pt is being followed by Palliative Care for [place].   CXR: Stable congestive heart failure.  2. Persistent right upper lobe consolidation and scarring,  consistent with known history of lung cancer and prior radiation  therapy.    Assessment / Plan / Recommendation  Clinical Impression   Pt seen for BSE today s/p significant choking episode w/ po's in room. Pt awakened easily w/ verbal/tactile cues -- he tended to doze off easily and required constant verbal/tactile cues to maintain attention to task. Pt required verbal cues for follow through w/ tasks but engaged well overall. He appeared deconditioned.  Pt is on [place] O2 support, 4L; afebrile. WBC wnl.   Pt appears to present w/ oropharyngeal phase dysphagia w/ primary pharyngeal phase deficits and  suspected Neuromuscular impact in setting of lengthy illness and deconditioned status. Pt is DROWSY and required verbal/tactile cues throughout the exam to maintain attention to po tasks. Suspect some impact on overall awareness/engagement during po tasks. These factors listed, along w/ illness in advanced age, increase risk for aspiration/aspiration pneumonia as well as impact overall oral intake.   Pt required min-mod tactile/verbal/visual cues for orientation to bolus presentation, follow through w/ tasks, and self-feeding support. He often had eyes closed b/t trials, but then fed self w/ full setup support and cues. Pt consumed trials of single ice chips, thin and Nectar liquids via Cup, and purees w/ feeding support. Immediate, overt clinical s/s of aspiration noted w/ trials of thin liquids(coughing) x1/2 trials as was noted by NSG this morning. W/ trials of Nectar liquids, single ice chips, and foods, no cough and no decline in respiratory presentation noted during/post trials. Suspect decreased attention to task as well as potential delayed pharyngeal swallowin initiation(d/t decreased awareness) could have impacted safety and success of pharyngeal swallow w/ thin liquids. Oral phase was c/b min slow, deliberate bolus management and oral clearing of all boluses given; intermittent episodes of min/diffuse bolus residue noted on tongue w/ puree foods b/t trials. Alternating food/liquid boluses appeared to aid the oral clearing sufficiently. Solid foods were not assessed this session.  OM exam was adequate w/ no unilateral weakness noted. Pt helped to hold Cup to feed self which decreases risk for aspiration when drinking liquids.   Recommend dysphagia level 1 diet(purees moistened) w/ Nectar consistency  liquids via cup; aspiration precautions; Pills Crushed in puree for safety; feeding support and supervision at meals, reduce Distractions during meals and check for oral clearing during/post intake -  alternate foods/liquids. Pt may have single ice chips PRN w/ supervision post oral care. NSG/MD updated.  ST services will continue to follow while admitted and at next venue of care hopefully for trials to upgrade diet as pt's medical status and Stamina/endurance improve. Recommend continued f/u by Palliative Care for [place].  SLP Visit Diagnosis: Dysphagia, oropharyngeal phase (R13.12)    Aspiration Risk  Mild aspiration risk;Risk for inadequate nutrition/hydration    Diet Recommendation   dysphagia level 1 diet(purees moistened) w/ Nectar consistency liquids via cup; aspiration precautions; feeding support and supervision at meals, reduce Distractions during meals and check for oral clearing during/post intake - alternate foods/liquids.  Medication Administration: Crushed with puree    Other  Recommendations Recommended Consults:  (Dietician f/u; Palliative Care f/u) Oral Care Recommendations: Oral care BID;Oral care before and after PO;Staff/trained caregiver to provide oral care;Oral care prior to ice chip/H20 Other Recommendations: Order thickener from pharmacy;Prohibited food (jello, ice cream, thin soups);Remove water pitcher;Have oral suction available    Recommendations for follow up therapy are one component of a multi-disciplinary discharge planning process, led by the attending physician.  Recommendations may be updated based on patient status, additional functional criteria and insurance authorization.  Follow up Recommendations Skilled nursing-short term rehab (<3 hours/day)      Assistance Recommended at Discharge  Full assistance at next venue of care   Functional Status Assessment Patient has had a recent decline in their functional status and/or demonstrates limited ability to make significant improvements in function in a reasonable and predictable amount of time  Frequency and Duration min 2x/week  1 week       Prognosis Prognosis for Safe Diet Advancement:  Guarded Barriers to Reach Goals: Cognitive deficits;Time post onset;Severity of deficits Barriers/Prognosis Comment: deconditioned status      Swallow Study   General Date of Onset: 03/23/22 HPI: Pt presented to ER secondary to mechanical fall with acute onset of R hip/elbow pain; admitted for management of closed R hip fracture, s/p R hip hemiarthroplasty (03/24/22).  PMH includes: afib; ckd; lung Ca.  Pt with rapid response 11/30 and transferred to PCU; pt noted with PE 03/26/22.  Pt is being followed by Palliative Care for [place].   CXR: Stable congestive heart failure.  2. Persistent right upper lobe consolidation and scarring,  consistent with known history of lung cancer and prior radiation  therapy. Type of Study: Bedside Swallow Evaluation (Patient had 1 episode of choking this morning but remained stable on 4 L of oxygen.) Previous Swallow Assessment: none Diet Prior to this Study: Regular;Thin liquids Temperature Spikes Noted: No (wbc 8.0) Respiratory Status: Nasal cannula (4L) History of Recent Intubation: No Behavior/Cognition: Alert;Cooperative;Pleasant mood;Distractible;Requires cueing Oral Cavity Assessment: Within Functional Limits Oral Care Completed by SLP: Yes Oral Cavity - Dentition: Missing dentition;Adequate natural dentition (few) Vision: Functional for self-feeding Self-Feeding Abilities: Able to feed self;Needs assist;Needs set up Patient Positioning: Upright in bed (needed support) Baseline Vocal Quality: Normal;Low vocal intensity Volitional Cough: Strong;Congested Volitional Swallow: Able to elicit    Oral/Motor/Sensory Function Overall Oral Motor/Sensory Function: Within functional limits   Ice Chips Ice chips: Within functional limits Presentation: Spoon (fed; 4 trials)   Thin Liquid Thin Liquid: Impaired Presentation: Cup;Self Fed (1/2 trials) Oral Phase Impairments: Poor awareness of bolus Pharyngeal  Phase Impairments: Suspected delayed Swallow;Cough -  Immediate (x1/2 trials)    Nectar Thick Nectar Thick Liquid: Within functional limits Presentation: Cup;Self Fed;Spoon (~3 ozs total)   Honey Thick Honey Thick Liquid: Not tested   Puree Puree: Impaired Presentation: Spoon (fed; 10 trials) Oral Phase Impairments: Poor awareness of bolus Oral Phase Functional Implications: Oral residue (min+ intermittent) Pharyngeal Phase Impairments:  (none) Other Comments: took his time   Solid     Solid: Not tested        Orinda Kenner, MS, [place]; [place] [phone] (ascom) Miliani Deike 03/29/2022,4:43 PM

## 2022-03-29 NOTE — TOC Progression Note (Addendum)
Transition of Care [facility]) - Progression Note    Patient Details  Name: Eric Hicks MRN: [id number] Date of Birth: [birth year]  Transition of Care [facility]) CM/SW Contact  Gerilyn Pilgrim, LCSW Phone Number: 03/29/2022, 11:54 AM  Clinical Narrative:     SW reached out to patients daughter and left a voicemail for her to call back and discuss bed offers. SW spoke with daughter she states her preferences are 1. [facility]. [facility] 3. [facility] and 4. [facility].  [facility], [facility] and [facility] have all declined due to being full. Daughter would like SW to reach back out and see when they might have an opening. SW will send referral to [facility] as it is not in the [place].   2:00pm SW received second call from daughter who stated she did not want any of the bed offers on the list she stated she would like for [place] to look at [facility] in [place], [facility] in [place], [facility] in [place], and [facility] in [place]. From daughters list it appears they are all ALF level placements except for [facility]. SW will follow up with daughter to let her know. SW sent message to [place] and LVM with [facility] [place]. SW reached back out to [facility] who stated they are not anticipating having a bed for another week.  3:32pm SW reached out to provider who stated pt was medically ready to discharge to SNF. SW reached out to daughter who stated she wanted the above facilities. SW explained she would need to pick another option as patient is ready to be discharged and she has multiple options for SNF. Daughter declined those options and stated she did not feel her father was anywhere near ready to be discharged and refused to accept any of the accepting facilities. SW shared information with MD.   4:00pm Received call from daughter who states she would now like [place] to try [facility] in [place]. LVM with admissions coordinator [place].    4:30pm [facility] stated they can accept if the patient had gotten his [place] shot recently (gets this every 6 months). Per care everywhere it appears the patient is not due for this shot until April of 2024. Daughter is trying to verify this as well through his my chart. Auth would need to be started on 12/5. Facility was made aware.  Expected Discharge Plan: [place] Barriers to Discharge: Continued Medical Work up  Expected Discharge Plan and Services Expected Discharge Plan: [place] Choice: [place] arrangements for the past 2 months: Single Family Home                                       Social Determinants of Health (SDOH) Interventions    Readmission Risk Interventions     No data to display

## 2022-03-29 NOTE — Evaluation (Addendum)
Occupational Therapy Evaluation Patient Details Name: Eric Hicks MRN: [id number] DOB: June 09, 1926 Today's Date: 03/29/2022   History of Present Illness Pt presented to ER secondary to mechanical fall with acute onset of R hip/elbow pain; admitted for management of closed R hip fracture, s/p R hip hemiarthroplasty (03/24/22).  Pt with rapid response 11/30 and transferred to PCU; pt noted with PE 12/1.   Clinical Impression   Patient seen for OT evaluation, family present. Pt presenting with decreased independence in self care, balance, functional mobility/transfers, endurance, and safety awareness. Prior to admission, pt was independent for ADLs and functional mobility without an AD. Pt was still driving and lives alone. During evaluation, pt required Max A for bed mobility, Min A +2 to stand from elevated EOB, Min A +2 to take steps toward recliner, Max A for LB dressing, and Max A for posterior hygiene in standing. He required VC for safety, maintaining posterior hip precautions, and sequencing of OOB mobility. Pt on 4L O2 via [place] throughout. Pt will benefit from acute OT to increase overall independence in the areas of ADLs and functional mobility in order to safely discharge to next venue of care. Upon hospital discharge, recommend STR to maximize pt safety and return to PLOF.    Recommendations for follow up therapy are one component of a multi-disciplinary discharge planning process, led by the attending physician.  Recommendations may be updated based on patient status, additional functional criteria and insurance authorization.   Follow Up Recommendations  Skilled nursing-short term rehab (<3 hours/day)     Assistance Recommended at Discharge Frequent or constant Supervision/Assistance  Patient can return home with the following Two people to help with bathing/dressing/bathroom;Two people to help with walking and/or transfers;Assistance with cooking/housework;Assist for transportation;Help  with stairs or ramp for entrance    Functional Status Assessment  Patient has had a recent decline in their functional status and demonstrates the ability to make significant improvements in function in a reasonable and predictable amount of time.  Equipment Recommendations  Other (comment);BSC/3in1 (hip kit)    Recommendations for Other Services       Precautions / Restrictions Precautions Precautions: Fall;Posterior Hip Restrictions Weight Bearing Restrictions: Yes RLE Weight Bearing: Weight bearing as tolerated      Mobility Bed Mobility Overal bed mobility: Needs Assistance Bed Mobility: Supine to Sit     Supine to sit: Max assist, HOB elevated     General bed mobility comments: assist for trunk and BLEs, VC for technique, Max A to scoot hips forward at EOB    Transfers Overall transfer level: Needs assistance Equipment used: Rolling walker (2 wheels) Transfers: Sit to/from Stand Sit to Stand: Min assist, +2 physical assistance, From elevated surface                  Balance Overall balance assessment: Needs assistance Sitting-balance support: No upper extremity supported, Feet supported Sitting balance-Leahy Scale: Fair     Standing balance support: Bilateral upper extremity supported, Reliant on assistive device for balance Standing balance-Leahy Scale: Fair                             ADL either performed or assessed with clinical judgement   ADL Overall ADL's : Needs assistance/impaired Eating/Feeding: Set up;Sitting                   Lower Body Dressing: Maximal assistance;Sitting/lateral leans Lower Body Dressing Details (indicate cue type and  reason): socks Toilet Transfer: Minimal assistance;+2 for physical assistance;Rolling walker (2 wheels);Cueing for safety;Cueing for sequencing Toilet Transfer Details (indicate cue type and reason): simulated with STS from [place] and Hygiene: Maximal  assistance;Sit to/from stand Toileting - Clothing Manipulation Details (indicate cue type and reason): for posterior hygiene after incontinent BM episode     Functional mobility during ADLs: Minimal assistance;+2 for physical assistance;Rolling walker (2 wheels);Cueing for safety;Cueing for sequencing (to take several steps to recliner, assist for RW management, multimodal cues for sequencing and hand placement)       Vision Baseline Vision/History: 1 Wears glasses Patient Visual Report: No change from baseline       Perception     Praxis      Pertinent Vitals/Pain Pain Assessment Pain Assessment: Faces Faces Pain Scale: Hurts little more Pain Location: R hip Pain Descriptors / Indicators: Aching, Grimacing, Guarding, Sore Pain Intervention(s): Limited activity within patient's tolerance, Monitored during session, Repositioned     Hand Dominance     Extremity/Trunk Assessment Upper Extremity Assessment Upper Extremity Assessment: Generalized weakness   Lower Extremity Assessment Lower Extremity Assessment: Generalized weakness;RLE deficits/detail RLE Deficits / Details: s/p R hip hemiarthroplasty   Cervical / Trunk Assessment Cervical / Trunk Assessment: Other exceptions Cervical / Trunk Exceptions: forward head/shoulders   Communication Communication Communication: HOH   Cognition Arousal/Alertness: Awake/alert Behavior During Therapy: WFL for tasks assessed/performed Overall Cognitive Status: Within Functional Limits for tasks assessed                                       General Comments  on 4L O2 via [place], SpO2 desatting to 70s with activity (poor pleth on monitor, reading at mid 90s with separate O2 monitor), VC for pursed lip breathing    Exercises Other Exercises Other Exercises: OT provided education re: role of OT, OT POC, post acute recs, sitting up for all meals, EOB/OOB mobility with assistance, home/fall safety, RLE WBAT, posterior hip  precautions, LB dressing AE   Shoulder Instructions      Home Living Family/patient expects to be discharged to:: Private residence Living Arrangements: Alone Available Help at Discharge: Family Type of Home: House Home Access: Stairs to enter Technical brewer of Steps: 4 Entrance Stairs-Rails: Right;Left;Can reach both Home Layout: Two level;Able to live on main level with bedroom/bathroom                          Prior Functioning/Environment Prior Level of Function : Independent/Modified Independent;Driving             Mobility Comments: household and some community mobilization without AD, still driving, denies fall history outside of this admission ADLs Comments: Independent        OT Problem List: Decreased strength; Decreased range of motion; Decreased activity tolerance; Impaired balance (sitting and/or standing); Decreased safety awareness; Decreased knowledge of use of DME or AE; Decreased knowledge of precautions; Pain; Cardiopulmonary status limiting activity; Decreased coordination; Decreased cognition       OT Treatment/Interventions: Self-care/ADL training;Therapeutic exercise;Therapeutic activities;DME and/or AE instruction;Patient/family education;Balance training;Cognitive remediation/compensation;Energy conservation    OT Goals(Current goals can be found in the care plan section) Acute Rehab OT Goals Patient Stated Goal: go home OT Goal Formulation: With patient/family Time For Goal Achievement: 04/12/22 Potential to Achieve Goals: Fair   OT Frequency: Min 2X/week    Co-evaluation PT/OT/SLP Co-Evaluation/Treatment: Yes  Reason for Co-Treatment: For patient/therapist safety;To address functional/ADL transfers PT goals addressed during session: Mobility/safety with mobility;Proper use of DME OT goals addressed during session: ADL's and self-care      AM-PAC OT "6 Clicks" Daily Activity     Outcome Measure Help from another person  eating meals?: A Little Help from another person taking care of personal grooming?: A Little Help from another person toileting, which includes using toliet, bedpan, or urinal?: A Lot Help from another person bathing (including washing, rinsing, drying)?: A Lot Help from another person to put on and taking off regular upper body clothing?: A Little Help from another person to put on and taking off regular lower body clothing?: A Lot 6 Click Score: 15   End of Session Equipment Utilized During Treatment: Gait belt;Rolling walker (2 wheels);Oxygen Nurse Communication: Mobility status  Activity Tolerance: Patient tolerated treatment well;Patient limited by fatigue Patient left: in chair;with call bell/phone within reach;with chair alarm set;with family/visitor present (left sitting in recliner with PT)  OT Visit Diagnosis: Other abnormalities of gait and mobility (R26.89);Muscle weakness (generalized) (M62.81);Pain Pain - Right/Left: Right Pain - part of body: Hip                Time: 9326-7124 OT Time Calculation (min): 22 min Charges:  OT General Charges $OT Visit: 1 Visit OT Evaluation $OT Eval Moderate Complexity: 1 Mod  [facility] MS, OTR/L ascom [phone]  03/29/22, 2:36 PM

## 2022-03-30 DIAGNOSIS — I130 Hypertensive heart and chronic kidney disease with heart failure and stage 1 through stage 4 chronic kidney disease, or unspecified chronic kidney disease: Secondary | ICD-10-CM | POA: Diagnosis not present

## 2022-03-30 DIAGNOSIS — R0603 Acute respiratory distress: Secondary | ICD-10-CM | POA: Diagnosis not present

## 2022-03-30 DIAGNOSIS — K920 Hematemesis: Secondary | ICD-10-CM | POA: Diagnosis not present

## 2022-03-30 DIAGNOSIS — N390 Urinary tract infection, site not specified: Secondary | ICD-10-CM | POA: Diagnosis not present

## 2022-03-30 DIAGNOSIS — N189 Chronic kidney disease, unspecified: Secondary | ICD-10-CM | POA: Diagnosis not present

## 2022-03-30 DIAGNOSIS — N1831 Chronic kidney disease, stage 3a: Secondary | ICD-10-CM | POA: Diagnosis not present

## 2022-03-30 DIAGNOSIS — R531 Weakness: Secondary | ICD-10-CM

## 2022-03-30 DIAGNOSIS — I4510 Unspecified right bundle-branch block: Secondary | ICD-10-CM | POA: Diagnosis not present

## 2022-03-30 DIAGNOSIS — I4891 Unspecified atrial fibrillation: Secondary | ICD-10-CM | POA: Diagnosis not present

## 2022-03-30 DIAGNOSIS — E1122 Type 2 diabetes mellitus with diabetic chronic kidney disease: Secondary | ICD-10-CM | POA: Diagnosis not present

## 2022-03-30 DIAGNOSIS — I5032 Chronic diastolic (congestive) heart failure: Secondary | ICD-10-CM | POA: Diagnosis not present

## 2022-03-30 DIAGNOSIS — Z781 Physical restraint status: Secondary | ICD-10-CM | POA: Diagnosis not present

## 2022-03-30 DIAGNOSIS — M810 Age-related osteoporosis without current pathological fracture: Secondary | ICD-10-CM | POA: Diagnosis not present

## 2022-03-30 DIAGNOSIS — Z7901 Long term (current) use of anticoagulants: Secondary | ICD-10-CM | POA: Diagnosis not present

## 2022-03-30 DIAGNOSIS — K921 Melena: Secondary | ICD-10-CM | POA: Diagnosis not present

## 2022-03-30 DIAGNOSIS — A4152 Sepsis due to Pseudomonas: Secondary | ICD-10-CM | POA: Diagnosis not present

## 2022-03-30 DIAGNOSIS — Z7401 Bed confinement status: Secondary | ICD-10-CM | POA: Diagnosis not present

## 2022-03-30 DIAGNOSIS — R6521 Severe sepsis with septic shock: Secondary | ICD-10-CM | POA: Diagnosis not present

## 2022-03-30 DIAGNOSIS — S72001D Fracture of unspecified part of neck of right femur, subsequent encounter for closed fracture with routine healing: Secondary | ICD-10-CM | POA: Diagnosis not present

## 2022-03-30 DIAGNOSIS — Z96641 Presence of right artificial hip joint: Secondary | ICD-10-CM | POA: Diagnosis not present

## 2022-03-30 DIAGNOSIS — R609 Edema, unspecified: Secondary | ICD-10-CM | POA: Diagnosis not present

## 2022-03-30 DIAGNOSIS — D649 Anemia, unspecified: Secondary | ICD-10-CM | POA: Diagnosis not present

## 2022-03-30 DIAGNOSIS — Z66 Do not resuscitate: Secondary | ICD-10-CM | POA: Diagnosis not present

## 2022-03-30 DIAGNOSIS — I420 Dilated cardiomyopathy: Secondary | ICD-10-CM | POA: Diagnosis not present

## 2022-03-30 DIAGNOSIS — Z9981 Dependence on supplemental oxygen: Secondary | ICD-10-CM | POA: Diagnosis not present

## 2022-03-30 DIAGNOSIS — N179 Acute kidney failure, unspecified: Secondary | ICD-10-CM | POA: Diagnosis not present

## 2022-03-30 DIAGNOSIS — E782 Mixed hyperlipidemia: Secondary | ICD-10-CM | POA: Diagnosis not present

## 2022-03-30 DIAGNOSIS — W101XXA Fall (on)(from) sidewalk curb, initial encounter: Secondary | ICD-10-CM | POA: Diagnosis not present

## 2022-03-30 DIAGNOSIS — N1832 Chronic kidney disease, stage 3b: Secondary | ICD-10-CM | POA: Diagnosis not present

## 2022-03-30 DIAGNOSIS — I480 Paroxysmal atrial fibrillation: Secondary | ICD-10-CM | POA: Diagnosis not present

## 2022-03-30 DIAGNOSIS — R6889 Other general symptoms and signs: Secondary | ICD-10-CM | POA: Diagnosis not present

## 2022-03-30 DIAGNOSIS — Z96649 Presence of unspecified artificial hip joint: Secondary | ICD-10-CM | POA: Diagnosis not present

## 2022-03-30 DIAGNOSIS — I428 Other cardiomyopathies: Secondary | ICD-10-CM | POA: Diagnosis not present

## 2022-03-30 DIAGNOSIS — M25551 Pain in right hip: Secondary | ICD-10-CM | POA: Diagnosis not present

## 2022-03-30 DIAGNOSIS — Z79899 Other long term (current) drug therapy: Secondary | ICD-10-CM | POA: Diagnosis not present

## 2022-03-30 DIAGNOSIS — J3489 Other specified disorders of nose and nasal sinuses: Secondary | ICD-10-CM | POA: Diagnosis not present

## 2022-03-30 DIAGNOSIS — R578 Other shock: Secondary | ICD-10-CM | POA: Diagnosis not present

## 2022-03-30 DIAGNOSIS — I2699 Other pulmonary embolism without acute cor pulmonale: Secondary | ICD-10-CM | POA: Diagnosis not present

## 2022-03-30 DIAGNOSIS — S72001S Fracture of unspecified part of neck of right femur, sequela: Secondary | ICD-10-CM | POA: Diagnosis not present

## 2022-03-30 DIAGNOSIS — I10 Essential (primary) hypertension: Secondary | ICD-10-CM | POA: Diagnosis not present

## 2022-03-30 DIAGNOSIS — R350 Frequency of micturition: Secondary | ICD-10-CM | POA: Diagnosis not present

## 2022-03-30 DIAGNOSIS — R918 Other nonspecific abnormal finding of lung field: Secondary | ICD-10-CM | POA: Diagnosis not present

## 2022-03-30 DIAGNOSIS — K922 Gastrointestinal hemorrhage, unspecified: Secondary | ICD-10-CM | POA: Diagnosis not present

## 2022-03-30 DIAGNOSIS — R1111 Vomiting without nausea: Secondary | ICD-10-CM | POA: Diagnosis not present

## 2022-03-30 DIAGNOSIS — Y92480 Sidewalk as the place of occurrence of the external cause: Secondary | ICD-10-CM | POA: Diagnosis not present

## 2022-03-30 DIAGNOSIS — E559 Vitamin D deficiency, unspecified: Secondary | ICD-10-CM | POA: Diagnosis not present

## 2022-03-30 DIAGNOSIS — Z87891 Personal history of nicotine dependence: Secondary | ICD-10-CM | POA: Diagnosis not present

## 2022-03-30 DIAGNOSIS — N136 Pyonephrosis: Secondary | ICD-10-CM | POA: Diagnosis not present

## 2022-03-30 DIAGNOSIS — I129 Hypertensive chronic kidney disease with stage 1 through stage 4 chronic kidney disease, or unspecified chronic kidney disease: Secondary | ICD-10-CM | POA: Diagnosis not present

## 2022-03-30 DIAGNOSIS — R58 Hemorrhage, not elsewhere classified: Secondary | ICD-10-CM | POA: Diagnosis not present

## 2022-03-30 DIAGNOSIS — R1312 Dysphagia, oropharyngeal phase: Secondary | ICD-10-CM | POA: Diagnosis not present

## 2022-03-30 DIAGNOSIS — J449 Chronic obstructive pulmonary disease, unspecified: Secondary | ICD-10-CM | POA: Diagnosis not present

## 2022-03-30 DIAGNOSIS — A419 Sepsis, unspecified organism: Secondary | ICD-10-CM | POA: Diagnosis not present

## 2022-03-30 DIAGNOSIS — W19XXXD Unspecified fall, subsequent encounter: Secondary | ICD-10-CM | POA: Diagnosis not present

## 2022-03-30 DIAGNOSIS — E872 Acidosis, unspecified: Secondary | ICD-10-CM | POA: Diagnosis not present

## 2022-03-30 DIAGNOSIS — Z9181 History of falling: Secondary | ICD-10-CM | POA: Diagnosis not present

## 2022-03-30 DIAGNOSIS — Z743 Need for continuous supervision: Secondary | ICD-10-CM | POA: Diagnosis not present

## 2022-03-30 DIAGNOSIS — J9601 Acute respiratory failure with hypoxia: Secondary | ICD-10-CM | POA: Diagnosis not present

## 2022-03-30 DIAGNOSIS — G9340 Encephalopathy, unspecified: Secondary | ICD-10-CM

## 2022-03-30 DIAGNOSIS — M80051D Age-related osteoporosis with current pathological fracture, right femur, subsequent encounter for fracture with routine healing: Secondary | ICD-10-CM | POA: Diagnosis not present

## 2022-03-30 DIAGNOSIS — B954 Other streptococcus as the cause of diseases classified elsewhere: Secondary | ICD-10-CM | POA: Diagnosis not present

## 2022-03-30 DIAGNOSIS — I517 Cardiomegaly: Secondary | ICD-10-CM | POA: Diagnosis not present

## 2022-03-30 DIAGNOSIS — R112 Nausea with vomiting, unspecified: Secondary | ICD-10-CM | POA: Diagnosis not present

## 2022-03-30 DIAGNOSIS — D62 Acute posthemorrhagic anemia: Secondary | ICD-10-CM | POA: Diagnosis not present

## 2022-03-30 DIAGNOSIS — Z23 Encounter for immunization: Secondary | ICD-10-CM | POA: Diagnosis not present

## 2022-03-30 DIAGNOSIS — E785 Hyperlipidemia, unspecified: Secondary | ICD-10-CM | POA: Diagnosis not present

## 2022-03-30 DIAGNOSIS — I700 Atherosclerosis of aorta: Secondary | ICD-10-CM | POA: Diagnosis not present

## 2022-03-30 MED ORDER — APIXABAN 5 MG PO TABS: 5.0000 mg | ORAL_TABLET | Freq: Two times a day (BID) | ORAL | Status: DC

## 2022-03-30 MED ORDER — SENNOSIDES-DOCUSATE SODIUM 8.6-50 MG PO TABS: 1.0000 | ORAL_TABLET | Freq: Every evening | ORAL | Status: DC | PRN

## 2022-03-30 MED ORDER — DOCUSATE SODIUM 100 MG PO CAPS: 100.0000 mg | ORAL_CAPSULE | Freq: Two times a day (BID) | ORAL | 0 refills | Status: DC

## 2022-03-30 MED ORDER — FE FUM-VIT C-VIT B12-FA 460-60-0.01-1 MG PO CAPS: 1.0000 | ORAL_CAPSULE | Freq: Every day | ORAL | 0 refills | Status: DC

## 2022-03-30 MED ORDER — TRAMADOL HCL 50 MG PO TABS: 50.0000 mg | ORAL_TABLET | Freq: Four times a day (QID) | ORAL | 0 refills | Status: DC | PRN

## 2022-03-30 NOTE — Progress Notes (Signed)
Speech Language Pathology Treatment: Dysphagia  Patient Details Name: Eric Hicks MRN: [id number] DOB: 09-20-1926 Today's Date: 03/30/2022 Time: 7096-2836 SLP Time Calculation (min) (ACUTE ONLY): 30 min  Assessment / Plan / Recommendation Clinical Impression  Met w/ pt's Family in room this PM for discussion re: BSE results and recommendations; dysphagia diet recommendation at this time. Son and Daughter present in room; pt pending transfer to SNF today possibly.   Discussed w/ Family pt's general presentation of mild+ oropharyngeal phase dysphagia w/ primary pharyngeal phase deficits and suspected Neuromuscular impact in setting of lengthy illness and deconditioned status. Pt is s/p Fall w/ hip surgery w/ identified PE this admit. Pt is DROWSY and requires verbal/tactile cues at times to maintain attention to po tasks. Suspect some impact from his decreased energy and alertness on overall awareness/engagement during po tasks. These factors listed, along w/ illness in advanced age, increase risk for aspiration/aspiration pneumonia as well as impact overall oral intake. Highlighted the impact of declined State and Endurance for physical tasks (including swallowing) and the need for Support and Time for healing/strengthening, especially in setting of pt's advanced age. Family agreed. They stated they saw a "difference" when eating last night -- "less coughing" during the meal.   Discussed and recommended continue w/ current dysphagia diet of Level 1(puree) w/ Nectar liquids via CUP; aspiration precautions; Pills Crushed in puree for safety; feeding support and supervision at meals, reduce Distractions during meals and check for oral clearing during/post intake - alternate foods/liquids.  Pt may have single ice chips PRN w/ supervision post oral care and NOT during a meal; frequent oral care for stimulation of swallowing. NSG/MD updated.   ST services will continue to follow while admitted and at next  venue of care hopefully for trials to upgrade diet as pt's medical status and Stamina/endurance improve. Recommend continued f/u by Palliative Care for Eric Hicks; Dietician f/u for support. Family agreed; questions answered.     HPI HPI: Pt presented to ER secondary to mechanical fall with acute onset of R hip/elbow pain; admitted for management of closed R hip fracture, s/p R hip hemiarthroplasty (03/24/22).  PMH includes: afib; ckd; lung Ca.  Pt with rapid response 11/30 and transferred to PCU; pt noted with PE 03/26/22.  Pt is being followed by Palliative Care for Eric Hicks.   CXR: Stable congestive heart failure.  2. Persistent right upper lobe consolidation and scarring,  consistent with known history of lung cancer and prior radiation  therapy.      SLP Plan  Continue with current plan of care      Recommendations for follow up therapy are one component of a multi-disciplinary discharge planning process, led by the attending physician.  Recommendations may be updated based on patient status, additional functional criteria and insurance authorization.    Recommendations  Diet recommendations: Dysphagia 1 (puree);Nectar-thick liquid Liquids provided via: Cup;No straw Medication Administration: Crushed with puree Supervision: Patient able to self feed;Staff to assist with self feeding;Intermittent supervision to cue for compensatory strategies Compensations: Minimize environmental distractions;Slow rate;Small sips/bites;Lingual sweep for clearance of pocketing;Follow solids with liquid Postural Changes and/or Swallow Maneuvers: Out of bed for meals;Seated upright 90 degrees;Upright 30-60 min after meal                General recommendations:  (Palliative Care consult for Eric Hicks; Dietician f/u) Oral Care Recommendations: Oral care BID;Oral care before and after PO;Staff/trained caregiver to provide oral care;Oral care prior to ice chip/H20 Follow Up Recommendations: Skilled nursing-short term  rehab  (<3 hours/day) Assistance recommended at discharge: Frequent or constant Supervision/Assistance SLP Visit Diagnosis: Dysphagia, oropharyngeal phase (R13.12) Plan: Continue with current plan of care             Orinda Kenner, [place], [place]; [facility] [phone] (ascom) Eric Hicks  03/30/2022, 3:10 PM

## 2022-03-30 NOTE — Progress Notes (Signed)
CSW rec'd confirmation that pt can discharge to [facility] today.  This information was relayed to MD.  Pt's dtr was contacted, but she stated that they hadn't approved pt going to facility. CSW discussed with dtr that, according to the notes from yesterday, there were conversations about family approving [facility].  Dtr wanted CSW to continue calling facilities, however, CSW explained the process for when there are bed offers.  CSW also informed dtr that [facility] had declined a bed offer. Dtr to call back within 15 minutes about accepted to [facility].

## 2022-03-30 NOTE — Progress Notes (Signed)
   Subjective: 6 Days Post-Op Procedure(s) (LRB): ARTHROPLASTY BIPOLAR HIP (HEMIARTHROPLASTY) (Right) Patient sleeping this am. Family at bedside, no complaints. Plan is to go Skilled nursing facility after hospital stay.  Objective: Vital signs in last 24 hours: Temp:  [97.6 F (36.4 C)-98.6 F (37 C)] 97.7 F (36.5 C) (12/05 1149) Pulse Rate:  [67-109] 77 (12/05 1149) Resp:  [0-30] 0 (12/05 1149) BP: (113-151)/(74-95) 117/74 (12/05 1149) SpO2:  [95 %-100 %] 100 % (12/05 1149)  Intake/Output from previous day: 12/04 0701 - 12/05 0700 In: 330 [P.O.:330] Out: 750 [Urine:750] Intake/Output this shift: No intake/output data recorded.  Recent Labs    03/28/22 0443  HGB 10.3*   Recent Labs    03/28/22 0443  WBC 8.0  RBC 3.19*  HCT 32.1*  PLT 121*   Recent Labs    03/28/22 0446  NA 142  K 4.7  CL 109  CO2 27  BUN 41*  CREATININE 1.33*  GLUCOSE 127*  CALCIUM 8.7*   No results for input(s): "LABPT", "INR" in the last 72 hours.   EXAM General - Patient is Alert, Appropriate, and Oriented. No distress. Extremity - Neurovascular intact Sensation intact distally Intact pulses distally Dorsiflexion/Plantar flexion intact No swelling in the lower extremities.  Negative Homans' sign bilaterally. Dressing - dressing C/D/I and no drainage Motor Function - intact, moving foot and toes well on exam.   Past Medical History:  Diagnosis Date   BPH (benign prostatic hyperplasia)    managed by Olena Heckle   COPD (chronic obstructive pulmonary disease) ([place])    Elevated PSA, between 10 and less than 20 ng/ml    Olena Heckle, watchful waiting   Hypertension    Osteoporosis    by DEXA    Assessment/Plan:   6 Days Post-Op Procedure(s) (LRB): ARTHROPLASTY BIPOLAR HIP (HEMIARTHROPLASTY) (Right) Principal Problem:   Acute respiratory failure with hypoxia (HCC) Active Problems:   BPH (benign prostatic hyperplasia)   White coat syndrome with diagnosis of hypertension    Nonischemic dilated cardiomyopathy (HCC)   Paroxysmal atrial fibrillation (HCC)   Closed right hip fracture (HCC)   Stage 3a chronic kidney disease (CKD) ([place])   Hyperlipidemia   Acute pulmonary embolism ([place])  Estimated body mass index is 24.39 kg/m as calculated from the following:   Height as of this encounter: 5\' 10"  (1.778 m).   Weight as of this encounter: 77.1 kg. Advance diet Up with therapy RIght hip pain well controlled Vital signs are stable Pulmonary Embolism - on Eliquis.  Palliative care involved  CM to assist with discharge.  Patient will need 2 week follow up with [facility] ortho    Weight-Bearing as tolerated to right leg   T. Rachelle Hora, PA-C [place] 03/30/2022, 12:42 PM

## 2022-03-30 NOTE — TOC Transition Note (Addendum)
Transition of Care [facility]) - CM/SW Discharge Note   Patient Details  Name: Eric Hicks MRN: [id number] Date of Birth: February 21, 1927  Transition of Care [facility]) CM/SW Contact:  Quin Hoop, LCSW Phone Number: 03/30/2022, 2:42 PM   Clinical Narrative:    Family has agreed to pt going to [facility] in [place] for rehab.  [facility] transport has been set up.  Discharge packet information in pt chart. Call report number is [phone].   Final next level of care: Skilled Nursing Facility Barriers to Discharge: No Barriers Identified   Patient Goals and CMS Choice Patient states their goals for this hospitalization and ongoing recovery are:: to go home CMS Medicare.gov Compare Post Acute Care list provided to:: Patient Represenative (must comment) (daughter Hilda Blades) Choice offered to / list presented to : Patient, Adult Children  Discharge Placement              Patient chooses bed at:  ([facility]) Patient to be transferred to facility by: [facility] Name of family member notified: Debra Patient and family notified of of transfer: 03/30/22  Discharge Plan and Services     Post Acute Care Choice: [facility]                               Social Determinants of Health (SDOH) Interventions     Readmission Risk Interventions     No data to display

## 2022-03-30 NOTE — Progress Notes (Signed)
Physical Therapy Treatment Patient Details Name: Eric Hicks MRN: [id number] DOB: 10/14/1926 Today's Date: 03/30/2022   History of Present Illness Pt presented to ER secondary to mechanical fall with acute onset of R hip/elbow pain; admitted for management of closed R hip fracture, s/p R hip hemiarthroplasty (03/24/22).  Pt with rapid response 11/30 and transferred to PCU; pt noted with PE 12/1.    PT Comments    Pt seen for PT tx with pt's son present but stepping out. Pt is progressing in the sense that he can mobilize with +1 assist, although +2 would be ideal 2/2 pt's confusion & multiple lines/leads. Pt is able to complete bed mobility & transfers with min assist with pt taking multiple steps BSC>recliner when pt demonstrates shuffled steps. Pt with continent BM on BSC but requires total assist for peri hygiene & pt frequently bending over while on BSC, not maintaining posterior hip precautions. Continue to recommend STR upon d/c to maximize independence with functional mobility & reduce fall risk prior to return home.    Recommendations for follow up therapy are one component of a multi-disciplinary discharge planning process, led by the attending physician.  Recommendations may be updated based on patient status, additional functional criteria and insurance authorization.  Follow Up Recommendations  Skilled nursing-short term rehab (<3 hours/day) Can patient physically be transported by private vehicle: No   Assistance Recommended at Discharge Frequent or constant Supervision/Assistance  Patient can return home with the following Assistance with cooking/housework;Assist for transportation;Help with stairs or ramp for entrance;A lot of help with walking and/or transfers;A lot of help with bathing/dressing/bathroom   Equipment Recommendations  Rolling walker (2 wheels);BSC/3in1;Wheelchair (measurements PT);Wheelchair cushion (measurements PT)    Recommendations for Other Services        Precautions / Restrictions Precautions Precautions: Fall;Posterior Hip Restrictions Weight Bearing Restrictions: Yes RLE Weight Bearing: Weight bearing as tolerated     Mobility  Bed Mobility Overal bed mobility: Needs Assistance Bed Mobility: Supine to Sit     Supine to sit: Min assist     General bed mobility comments: min assist to move RLE to EOB, reliance on HOB elevated, bed rails, cuing to scoot/square up to EOB.    Transfers   Equipment used: Rolling walker (2 wheels) Transfers: Sit to/from Stand, Bed to chair/wheelchair/BSC Sit to Stand: Min assist, From elevated surface   Step pivot transfers: Min assist       General transfer comment: cuing for safe hand placement during STS (push with UE on armrest, transition them to RW)    Ambulation/Gait                   Stairs             Wheelchair Mobility    Modified Rankin (Stroke Patients Only)       Balance Overall balance assessment: Needs assistance Sitting-balance support: Bilateral upper extremity supported, Feet supported Sitting balance-Leahy Scale: Fair Sitting balance - Comments: supervision static sitting   Standing balance support: Bilateral upper extremity supported, Reliant on assistive device for balance Standing balance-Leahy Scale: Fair                              Cognition Arousal/Alertness: Awake/alert Behavior During Therapy: WFL for tasks assessed/performed Overall Cognitive Status: No family/caregiver present to determine baseline cognitive functioning (Pt son in room upon PT arrival but exits shortly after)  General Comments: Pt with poor ability to follow simple commands with multimodal cuing, poor awareness overall & poor awareness of safety, unaware he's in the hospital, frequently removing O2 tubing & hearing aides & pulling at lines/leads despite PT attempting to educate/redirect.         Exercises Total Joint Exercises Straight Leg Raises: AROM, Strengthening, Right, 10 reps, Seated    General Comments General comments (skin integrity, edema, etc.): Pt on 4L/min via nasal cannula, inconsistent pleth waveform but when consistent SPO2 >/= 90%, unable to get HR reading 2/2 leads falling off throughout session/mobility      Pertinent Vitals/Pain Pain Assessment Pain Assessment: Faces Faces Pain Scale: No hurt    Home Living                          Prior Function            PT Goals (current goals can now be found in the care plan section) Acute Rehab PT Goals Patient Stated Goal: to go back home when ready PT Goal Formulation: With patient/family Time For Goal Achievement: 04/11/22 Potential to Achieve Goals: Fair Progress towards PT goals: Progressing toward goals    Frequency    7X/week      PT Plan Current plan remains appropriate    Co-evaluation              AM-PAC PT "6 Clicks" Mobility   Outcome Measure  Help needed turning from your back to your side while in a flat bed without using bedrails?: A Lot Help needed moving from lying on your back to sitting on the side of a flat bed without using bedrails?: A Lot Help needed moving to and from a bed to a chair (including a wheelchair)?: A Lot Help needed standing up from a chair using your arms (e.g., wheelchair or bedside chair)?: A Lot Help needed to walk in hospital room?: A Lot Help needed climbing 3-5 steps with a railing? : Total 6 Click Score: 11    End of Session Equipment Utilized During Treatment: Gait belt Activity Tolerance: Patient tolerated treatment well Patient left: in chair;with chair alarm set;with call bell/phone within reach Nurse Communication: Mobility status PT Visit Diagnosis: Other abnormalities of gait and mobility (R26.89);Difficulty in walking, not elsewhere classified (R26.2);Muscle weakness (generalized) (M62.81);Unsteadiness on feet (R26.81)      Time: 0926-1000 PT Time Calculation (min) (ACUTE ONLY): 34 min  Charges:  $Therapeutic Activity: 23-37 mins                     Lavone Nian, PT, DPT 03/30/22, 10:11 AM  Waunita Schooner 03/30/2022, 10:08 AM

## 2022-03-30 NOTE — Progress Notes (Signed)
   03/29/22 2000  Assess: MEWS Score  Temp 98.6 F (37 C)  BP 126/83  MAP (mmHg) 96  Pulse Rate (!) 105  ECG Heart Rate (!) 102  Resp 19  Level of Consciousness Alert  SpO2 96 %  O2 Device Nasal Cannula  O2 Flow Rate (L/min) 4 L/min  Assess: if the MEWS score is Yellow or Red  Were vital signs taken at a resting state? Yes  Focused Assessment No change from prior assessment  Does the patient meet 2 or more of the SIRS criteria? Yes  Does the patient have a confirmed or suspected source of infection? No  MEWS guidelines implemented *See Row Information* No, previously yellow, continue vital signs every 4 hours  Treat  MEWS Interventions Administered prn meds/treatments  Pain Scale 0-10  Pain Score 0  Faces Pain Scale 4  Pain Type Surgical pain  Pain Location Hip  Pain Orientation Right  Pain Descriptors / Indicators Aching;Discomfort  Pain Frequency Intermittent  Pain Intervention(s) Medication (See eMAR)  Neuro symptoms relieved by Rest  Patients response to intervention Effective  Take Vital Signs  Increase Vital Sign Frequency   (Previously yellow)  Notify: Charge Nurse/RN  Name of Charge Nurse/RN Notified Kristen, RN  Date Charge Nurse/RN Notified 03/29/22  Time Charge Nurse/RN Notified 2000  Document  Patient Outcome Stabilized after interventions  Progress note created (see row info) Yes  Assess: SIRS CRITERIA  SIRS Temperature  0  SIRS Pulse 1  SIRS Respirations  0  SIRS WBC 1  SIRS Score Sum  2

## 2022-03-30 NOTE — Care Management Important Message (Signed)
Important Message  Patient Details  Name: ANANDA SITZER MRN: [id number] Date of Birth: Nov 23, 1926   Medicare Important Message Given:  Yes     Juliann Pulse A [place] Wachter 03/30/2022, 3:33 PM

## 2022-03-30 NOTE — Progress Notes (Signed)
Patient ID: Eric Hicks, male   DOB: 1926/11/16, 88 y.o.   MRN: [id number]    Progress Note from the Palliative Medicine Team at [facility]   Patient Name: Eric Hicks        Date: 03/30/2022 DOB: April 05, 1927  Age: 88 y.o. MRN#: [id number] Attending Physician: Eric Nimrod, MD Primary Care Physician: Eric Mc, MD Admit Date: 03/23/2022   Medical records reviewed, assessed patient at bedside    88 y.o. male   admitted on 03/23/2022 with  past medical history of hypertension, hyperlipidemia, and white coat syndrome, who was admitted to [facility] on 03/23/2022 for Closed right hip fracture/ fall at home,  s/p repair 03-24-22    PMH significant for Acute kidney injury on chronic kidney disease stage IIIa. Baseline creatinine appears to be 1.34 on 03/08/22. Acute kidney injury likely secondary to cardiorenal syndrome. Renal ultrasound shows 52m non-obstructing calculus at the right upper pole.  Acute on chronic diastolic heart failure. ECHO completed today shows EF 55-60% with a Grade 1 diastolic dysfunction.  Acute respiratory failure with hypoxia, VQ scan positive for pulmonary embolism. Continue heparin drip and weaning oxygen requirements as tolerated.    Patient and family face treatment options decisions, advanced directive decisions and anticipatory care needs.     This NP assessed patient at the bedside as a follow up for palliative medicine needs and emotional support and to meet as scheduled with family.  I met again today with patient's daughter and 2 sons for continued conversation regarding current medical situation.  Patient does not have medical decision making capacity at this time  Further education offered on the patient's current medical situation; he continues with intermittent confusion, weakness and decreased oral intake.  Family remain hopeful for improvement and anticipate SNF for short term rehab.     Again education offered on the importance of  continued  conversation with family and their  medical providers regarding overall plan of care and treatment options,  ensuring decisions are within the context of the patients values and GOCs.  MOST form education and form left for review  Questions and concerns addressed   Discussed with Dr Eric Hicks   53minutes   MWadie LessenNP  Palliative Medicine Team Team Phone # [phone] [phone]

## 2022-03-30 NOTE — Discharge Summary (Signed)
Physician Discharge Summary   Patient: Eric Hicks MRN: [id number] DOB: 1927-01-20  Admit date:     03/23/2022  Discharge date: 03/30/22  Discharge Physician: Lorella Nimrod   PCP: Crecencio Mc, MD   Recommendations at discharge:  Please obtain CBC and BMP in 1 week Continue with supplemental oxygen at 2 to 4 L, wean as tolerated. Follow-up with primary care provider Follow-up with cardiology Follow-up with orthopedic surgery.  Discharge Diagnoses: Principal Problem:   Acute respiratory failure with hypoxia (HCC) Active Problems:   Acute pulmonary embolism (HCC)   Closed right hip fracture (HCC)   Stage 3a chronic kidney disease (CKD) (HCC)   Nonischemic dilated cardiomyopathy (HCC)   White coat syndrome with diagnosis of hypertension   BPH (benign prostatic hyperplasia)   Paroxysmal atrial fibrillation (HCC)   Hyperlipidemia  Resolved Problems:   * No resolved hospital problems. [facility] Course: [name] is a 88 year old male with history of hyperlipidemia, hypertension, impaired fasting glucose, history of whitecoat syndrome, who presents emergency department for chief concerns of mechanical fall and falling on his right hip and elbow, resulted and mildly impacted right femoral neck fracture  Patient denies loss of consciousness and hitting his head.  Initial vitals in the emergency department showed temperature of 97.6, respiration rate 18, heart rate 78, blood pressure 158/106, SpO2 of 96% on room air.  Labs in the ED showed serum sodium 142, potassium 4.4, bicarb 26, BUN 28, serum creatinine 1.31, GFR 51, nonfasting glucose 122. WBC 6.6, hemoglobin 13.1, platelets 144. CT head, CT cervical spine, CT chest and EKG elbow was without any acute abnormality. DG hip with mildly impacted right femoral neck fracture  ED treatment: Tdap booster has been ordered for injection.  Oxycodone 5 mg p.o. one-time dose.  11/29: Hemodynamically stable on 3 L oxygen, labs  pertinent for leukocytosis, most likely reactive, hemoglobin decreased to 12.5 from 13.1, BMP with slight worsening of BUN 31 and creatinine 1.40 which remained around his baseline of 1.3-1.4. Going to the OR with orthopedic surgery today.  11/30: Patient underwent hemiarthroplasty with orthopedic surgery on 11/29.  Required transfer Amick acid twice for bleeding.  Also received 1 dose of IV Lasix in PACU. Blood pressure mildly elevated at 142/82 this morning.  Renal function with slight deterioration to 41/1.58, GFR decreased to 41 from 47. Anemia panel with normal iron and low normal folate, B12 pending. Patient also with new oxygen requirement-history of COPD-no wheezing. Encouraging incentive spirometry and flutter valve. Chest x-ray with stable central vascular congestion, bilateral perihilar airspace disease left pleural effusion and BNP markedly elevated at 1960.  Patient was given IV Lasix and started on regular diuresis.  Also found to have elevated troponin, patient was started on heparin infusion and cardiology was consulted.  12/1: Patient overnight with worsening respiratory status, started on heated high flow with FiO2 of 80%, which is now weaned to 55% and 45L.  D-dimer was also found to be elevated at 2.51 which also makes PE possible.  VQ scan ordered for concern of PE, trying to avoid contrast with CTA due to AKI.  VQ scan with acute PE.   Worsening renal function today with BUN 50, creatinine 1.90.  Renal ultrasound with no hydronephrosis , some cortical thinning and no other significant abnormality. nephrology was also consulted. Holding a.m. dose of Lasix. Echocardiogram with improvement of EF to 50 to 94%, grade 1 diastolic dysfunction and no regional wall motion abnormalities.  No right heart strain  mentioned. I will let cardiology decide about further diuresis.  Patient might get benefit from Lasix infusion for gentle diuresis. Also sending message to vascular surgery due to  worsening respiratory status.  Patient with multiorgan failure, high risk for deterioration and mortality.  Discussed with his 2 sons and daughter at bedside.  They do have some legal papers which tells that he does not want extraordinary measures like feeding tube.  Not sure about DNR, he is currently full code.  Discussed with family to decide.  Also consulting palliative care.  12/2: Patient currently hemodynamically stable with improved oxygen requirement, able to wean to 6 L.  Improving renal function with GFR of 41 and creatinine of 1.58 today. He was not a candidate for any surgical or vascular intervention as being evaluated by vascular surgery also.  Leukocytosis resolved.  12/3: Vital seems stable, saturating in mid 90s on 4 L of oxygen.  Creatinine improved to baseline, at 1.33.  CBC seems stable.  12/4: Patient had 1 episode of choking this morning but remained stable on 4 L of oxygen. Swallow team placed him on dysphagia diet.  Dozing off while talking and eating. Palliative care met with family and CODE STATUS changed to DNR.  12/5: Patient remained medically stable.  He was saturating 100% on 4 L of oxygen and should be able to wean down.  He is being discharged on 2 to 4 L of oxygen and facility can try weaning him appropriately.  Patient is being discharged to rehab for further management.  He will use Eliquis 5 mg twice daily for PE, will require anticoagulation for at least 3 to 54-month his PCP can decide the duration. He is also being given tramadol to be used as needed for pain.  He is allergic to acetaminophen and we will avoid NSAIDs due to CKD.  Patient will continue on current medications and need to have a close follow-up with his providers for further recommendations.  Patient will remain high risk for deterioration and mortality based on age, underlying comorbidities and recent hip fracture.  Assessment and Plan: * Acute respiratory failure with hypoxia (HCC) Seems  improving, he was able to weaned down to 4 L.  VQ scan with acute pulmonary embolism.  Elevated BNP and echocardiogram with improvement of his EF, no regional wall motion abnormalities and grade 1 diastolic dysfunction.  Initially treated with heparin -Continue with Eliquis  Acute pulmonary embolism (HCC) VQ scan positive for acute pulmonary embolism.  Unable to do CTA due to AKI. Worsening respiratory status. Not a candidate for any surgical intervention. Aspiratory status improving -Switch him to Eliquis  Closed right hip fracture (HCC) Secondary to mechanical fall, s/p hemiarthroplasty on 10/29. -PT/OT evaluation -Continue with pain management-try avoiding opioids.  Stage 3a chronic kidney disease (CKD) ([facility] AKI resolved.  Creatinine currently at baseline Patient developed AKI with CKD stage IIIa.   Creatinine started improving after holding Lasix. Renal ultrasound with cortical thickening and no hydronephrosis. -Monitor renal function -Avoid nephrotoxins  Nonischemic dilated cardiomyopathy ([place] Repeat echocardiogram with improvement of EF to 50 to 55% with grade 1 diastolic dysfunction.  No regional wall motion abnormalities or right ventricular strain noted.  Troponin elevated most likely secondary to demand with acute pulmonary embolism and respiratory distress.  BNP at 1963. Patient did receive IV Lasix due to pulmonary vascular congestion and respiratory distress resulted in worsening of AKI. -Cardiology consult -Continue home carvedilol and statin -Holding further Lasix-will let nephrology and cardiology decide  [facility]  [facility] syndrome with diagnosis of hypertension Blood pressure mostly within goal. -Continue carvedilol and Cardura  BPH (benign prostatic hyperplasia) -Continue Cardura  Paroxysmal atrial fibrillation (HCC) Chronic atrial fibrillation and sees [facility] cardiology - Per note on 02/11/2022, patient has declined anticoagulation therefore he is not on  anticoagulation - Patient is on carvedilol 6.25 mg p.o. twice daily, this was resumed  Hyperlipidemia - Atorvastatin 20 mg daily resumed   Consultants: Orthopedic surgery, cardiology, nephrology Procedures performed: Hemiarthroplasty Disposition: Skilled nursing facility Diet recommendation:  Discharge Diet Orders (From admission, onward)     Start     Ordered   03/30/22 0000  Diet - low sodium heart healthy        03/30/22 1405           Heart healthy.  Dysphagia 1 with thick liquid DISCHARGE MEDICATION: Allergies as of 03/30/2022       Reactions   Tylenol [acetaminophen] Rash        Medication List     TAKE these medications    Advair Diskus 100-50 MCG/ACT Aepb Generic drug: fluticasone-salmeterol INHALE 1 PUFF INTO THE LUNGS TWICE A DAY   AMBULATORY NON FORMULARY MEDICATION Joint Advantage Gold 2 tablets daily   apixaban 5 MG Tabs tablet Commonly known as: ELIQUIS Take 1 tablet (5 mg total) by mouth 2 (two) times daily. Start taking on: April 03, 2022   atorvastatin 20 MG tablet Commonly known as: LIPITOR TAKE 1 TABLET BY MOUTH EVERY OTHER DAY   calcium carbonate 600 MG Tabs tablet Commonly known as: OS-CAL Take 600 mg by mouth daily with breakfast.   carvedilol 6.25 MG tablet Commonly known as: COREG Take 6.25 mg by mouth 2 (two) times daily.   docusate sodium 100 MG capsule Commonly known as: COLACE Take 1 capsule (100 mg total) by mouth 2 (two) times daily.   dorzolamide-timolol 2-0.5 % ophthalmic solution Commonly known as: COSOPT TAKE 1 DROP(S) IN RIGHT EYE 2 TIMES A DAY   doxazosin 1 MG tablet Commonly known as: CARDURA TAKE 1 TABLET BY MOUTH EVERY DAY   EYLEA IO Inject into the eye.   Fe Fum-Vit C-Vit B12-FA Caps capsule Commonly known as: TRIGELS-F FORTE Take 1 capsule by mouth daily after breakfast. Start taking on: March 31, 2022   furosemide 20 MG tablet Commonly known as: LASIX TAKE 2 TABLETS BY MOUTH DAILY AS  NEEDED   latanoprost 0.005 % ophthalmic solution Commonly known as: XALATAN Place 1 drop into both eyes at bedtime.   leuprolide (6 Month) 45 MG injection Commonly known as: ELIGARD Inject 45 mg into the skin every 6 (six) months.   senna-docusate 8.6-50 MG tablet Commonly known as: Senokot-S Take 1 tablet by mouth at bedtime as needed for mild constipation.   traMADol 50 MG tablet Commonly known as: ULTRAM Take 1 tablet (50 mg total) by mouth every 6 (six) hours as needed for moderate pain.   Vitamin D (Cholecalciferol) 25 MCG (1000 UT) Caps Take 1 capsule by mouth daily.               Discharge Care Instructions  (From admission, onward)           Start     Ordered   03/30/22 0000  Leave dressing on - Keep it clean, dry, and intact until clinic visit        03/30/22 1405            Follow-up Information     Paraschos, Sheppard Coil, MD.  Go in 1 week(s).   Specialty: Cardiology Contact information: [facility] [place] [address] [phone]         Steffanie Rainwater, MD. Schedule an appointment as soon as possible for a visit in 1 week(s).   Specialty: Orthopedic Surgery Contact information: [address] [place] [address] [phone]                Discharge Exam: Danley Danker Weights   03/23/22 2029  Weight: 77.1 kg   General.  Frail elderly man, in no acute distress. Pulmonary.  Lungs clear bilaterally, normal respiratory effort. CV.  Regular rate and rhythm, no JVD, rub or murmur. Abdomen.  Soft, nontender, nondistended, BS positive. CNS.  Alert and oriented .  No focal neurologic deficit. Extremities.  No edema, no cyanosis, pulses intact and symmetrical. Psychiatry.  Judgment and insight appears normal.   Condition at discharge: stable  The results of significant diagnostics from this hospitalization (including imaging, microbiology, ancillary and laboratory) are listed below for  reference.   Imaging Studies: ECHOCARDIOGRAM COMPLETE  Result Date: 03/26/2022    ECHOCARDIOGRAM REPORT   Patient Name:   CURTIS CAIN Sikora Date of Exam: 03/26/2022 Medical Rec #:  [id number]    Height:       70.0 in Accession #:    [id number]   Weight:       170.0 lb Date of Birth:  1927/03/01    BSA:          1.948 m Patient Age:    23 years     BP:           126/75 mmHg Patient Gender: M            HR:           84 bpm. Exam Location:  [facility] Procedure: 2D Echo, Cardiac Doppler and Color Doppler Indications:     Abnormal ECG R94.31  History:         Patient has prior history of Echocardiogram examinations, most                  recent 06/29/2018. COPD; Risk Factors:Hypertension.  Sonographer:     Sherrie Sport Referring Phys:  [id number] [facility] Candelario Steppe Diagnosing Phys: Yolonda Kida MD  Sonographer Comments: Image quality was good. IMPRESSIONS  1. Left ventricular ejection fraction, by estimation, is 55 to 60%. The left ventricle has normal function. The left ventricle has no regional wall motion abnormalities. There is mild concentric left ventricular hypertrophy. Left ventricular diastolic parameters are consistent with Grade I diastolic dysfunction (impaired relaxation).  2. Right ventricular systolic function is normal. The right ventricular size is normal.  3. Left atrial size was mildly dilated.  4. Right atrial size was mildly dilated.  5. The mitral valve is grossly normal. Mild mitral valve regurgitation.  6. The aortic valve is calcified. Aortic valve regurgitation is mild to moderate. FINDINGS  Left Ventricle: Left ventricular ejection fraction, by estimation, is 55 to 60%. The left ventricle has normal function. The left ventricle has no regional wall motion abnormalities. The left ventricular internal cavity size was normal in size. There is  mild concentric left ventricular hypertrophy. Left ventricular diastolic parameters are consistent with Grade I diastolic dysfunction (impaired relaxation). Right  Ventricle: The right ventricular size is normal. No increase in right ventricular wall thickness. Right ventricular systolic function is normal. Left Atrium: Left atrial size was mildly dilated. Right Atrium: Right atrial size was mildly  dilated. Pericardium: There is no evidence of pericardial effusion. Mitral Valve: The mitral valve is grossly normal. There is mild thickening of the mitral valve leaflet(s). There is mild calcification of the mitral valve leaflet(s). Mildly decreased mobility of the mitral valve leaflets. Mild mitral annular calcification. Mild mitral valve regurgitation. Tricuspid Valve: The tricuspid valve is normal in structure. Tricuspid valve regurgitation is trivial. Aortic Valve: The aortic valve is calcified. Aortic valve regurgitation is mild to moderate. Aortic regurgitation PHT measures 396 msec. Aortic valve mean gradient measures 3.0 mmHg. Aortic valve peak gradient measures 5.5 mmHg. Aortic valve area, by VTI  measures 3.22 cm. Pulmonic Valve: The pulmonic valve was normal in structure. Pulmonic valve regurgitation is not visualized. Aorta: The ascending aorta was not well visualized. IAS/Shunts: No atrial level shunt detected by color flow Doppler.  LEFT VENTRICLE PLAX 2D LVOT diam:     2.20 cm LV SV:         55 LV SV Index:   28 LVOT Area:     3.80 cm  RIGHT VENTRICLE RV Basal diam:  4.60 cm RV Mid diam:    3.90 cm RV S prime:     11.40 cm/s TAPSE (M-mode): 1.2 cm LEFT ATRIUM             Index        RIGHT ATRIUM           Index LA diam:        4.10 cm 2.10 cm/m   RA Area:     28.00 cm LA Vol (A2C):   64.8 ml 33.26 ml/m  RA Volume:   83.20 ml  42.71 ml/m LA Vol (A4C):   48.5 ml 24.90 ml/m LA Biplane Vol: 57.0 ml 29.26 ml/m  AORTIC VALVE AV Area (Vmax):    2.98 cm AV Area (Vmean):   2.85 cm AV Area (VTI):     3.22 cm AV Vmax:           117.00 cm/s AV Vmean:          81.500 cm/s AV VTI:            0.171 m AV Peak Grad:      5.5 mmHg AV Mean Grad:      3.0 mmHg LVOT Vmax:          91.80 cm/s LVOT Vmean:        61.000 cm/s LVOT VTI:          0.145 m LVOT/AV VTI ratio: 0.85 AI PHT:            396 msec  AORTA Ao Root diam: 3.90 cm MITRAL VALVE               TRICUSPID VALVE MV Area (PHT): 4.99 cm    TR Peak grad:   28.1 mmHg MV Decel Time: 152 msec    TR Vmax:        265.00 cm/s MV E velocity: 81.80 cm/s                            SHUNTS                            Systemic VTI:  0.14 m                            Systemic Diam:  2.20 cm Yolonda Kida MD Electronically signed by Yolonda Kida MD Signature Date/Time: 03/26/2022/1:43:34 PM    Final    US RENAL  Result Date: 03/26/2022 CLINICAL DATA:  Acute kidney injury EXAM: RENAL / URINARY TRACT ULTRASOUND COMPLETE COMPARISON:  None available FINDINGS: Right Kidney: Renal measurements: 9.3 x 5.1 x 4.6 cm = volume: 116 mL. Mild diffuse cortical thinning. No mass or hydronephrosis visualized. 9 mm nonobstructing calculus present in the upper pole. Left Kidney: Renal measurements: 10.7 x 5.8 x 3.9 cm = volume: 127 mL. Mild diffuse cortical thinning. No mass or hydronephrosis visualized. 1.6 cm simple cyst in the upper pole does not require further imaging follow-up. Bladder: Unable to evaluate due to collapse configuration. Other: None. IMPRESSION: 1. No hydronephrosis. 2. Mild diffuse cortical thinning of the kidneys consistent with chronic renal failure. 3. 9 mm nonobstructing calculus in the upper pole of the right kidney. Electronically Signed   By: Miachel Roux M.D.   On: 03/26/2022 11:31   NM Pulmonary Perfusion  Result Date: 03/26/2022 CLINICAL DATA:  Concern for pulmonary embolism. Respiratory distress. Worsening respiratory distress. Rapid breathing EXAM: NUCLEAR MEDICINE PERFUSION LUNG SCAN TECHNIQUE: Perfusion images were obtained in multiple projections after intravenous injection of radiopharmaceutical. RADIOPHARMACEUTICALS:  4.5 mCi Tc-59mMAA COMPARISON:  Chest radiograph 03/25/2022, CT 01/12/2022 insert FINDINGS:  Wedge-shaped peripheral perfusion defect within LEFT upper lobe is not matched on comparison chest radiograph. Small perfusion defect within the lateral aspect of the RIGHT upper lobe is also unmatched. More central RIGHT upper lobe perfusion defect corresponds to mass on comparison radiograph. regional decreased perfusion to the LEFT lower lobe. IMPRESSION: Unmatched wedge-shaped peripheral perfusion defects in the upper lobes are most consistent acute pulmonary emboli. These results will be called to the ordering clinician or representative by the Radiologist Assistant, and communication documented in the PACS or [employer] Electronically Signed   By: SSuzy BouchardM.D.   On: 03/26/2022 11:26   DG Chest Port 1 View  Result Date: 03/25/2022 CLINICAL DATA:  Hypoxia, history of right upper lobe lung cancer EXAM: PORTABLE CHEST 1 VIEW COMPARISON:  03/24/2022, 01/12/2022 FINDINGS: Single frontal view of the chest demonstrates persistent enlargement of the cardiac silhouette. No change in the central vascular congestion, bilateral perihilar airspace disease, and left pleural effusion. Stable right upper lobe consolidation consistent with history of lung cancer and radiation therapy. No acute bony abnormalities. IMPRESSION: 1. Stable congestive heart failure. 2. Persistent right upper lobe consolidation and scarring, consistent with known history of lung cancer and prior radiation therapy. Electronically Signed   By: MRanda NgoM.D.   On: 03/25/2022 16:10   DG Chest Port 1 View  Result Date: 03/24/2022 CLINICAL DATA:  Hypoxia EXAM: PORTABLE CHEST 1 VIEW COMPARISON:  03/23/2022 FINDINGS: Cardiac shadow is enlarged but stable. Increasing vascular congestion with interstitial edema is noted. No sizable effusion is seen. No bony abnormality is noted. IMPRESSION: Increasing CHF. Electronically Signed   By: MInez CatalinaM.D.   On: 03/24/2022 20:56   DG Pelvis Portable  Result Date:  03/24/2022 CLINICAL DATA:  Right hip replacement EXAM: PORTABLE PELVIS 1-2 VIEWS COMPARISON:  None Available. FINDINGS: Orthopedic view of the pelvis demonstrates surgical changes of right hip bipolar hemiarthroplasty. Periarticular gas is likely postsurgical in nature. Normal alignment. No unexpected fracture or dislocation. IMPRESSION: 1. Right hip bipolar hemiarthroplasty. Electronically Signed   By: AFidela SalisburyM.D.   On: 03/24/2022 18:31   DG FEMUR, MIN 2 VIEWS RIGHT  Result Date:  03/24/2022 CLINICAL DATA:  Closed right hip fracture. EXAM: RIGHT FEMUR 2 VIEWS COMPARISON:  Pelvis and right hip radiographs 03/23/2022 FINDINGS: There is diffuse decreased bone mineralization. Redemonstration of mildly impacted proximal right femoral neck fracture. There is mild anterior apex angulation and likely approximately 8 mm anterior displacement of the distal fracture component with respect to the proximal fracture component. Mild-to-moderate right femoroacetabular joint space narrowing. Mild tricompartmental osteoarthritis of the right knee. Vascular phleboliths overlie the pelvis. Mild-to-moderate arterial vascular calcifications. IMPRESSION: Acute mildly impacted and angulated proximal right femoral neck fracture, similar to prior. Electronically Signed   By: Yvonne Kendall M.D.   On: 03/24/2022 08:40   CT Head Wo Contrast  Result Date: 03/23/2022 CLINICAL DATA:  Head trauma, minor (Age >= 65y); Neck trauma (Age >= 65y) EXAM: CT HEAD WITHOUT CONTRAST CT CERVICAL SPINE WITHOUT CONTRAST TECHNIQUE: Multidetector CT imaging of the head and cervical spine was performed following the standard protocol without intravenous contrast. Multiplanar CT image reconstructions of the cervical spine were also generated. RADIATION DOSE REDUCTION: This exam was performed according to the departmental dose-optimization program which includes automated exposure control, adjustment of the mA and/or kV according to patient size  and/or use of iterative reconstruction technique. COMPARISON:  CT chest 01/12/2022, CT chest 01/09/2021 FINDINGS: CT HEAD FINDINGS Brain: Cerebral ventricle sizes are concordant with the degree of cerebral volume loss. Patchy and confluent areas of decreased attenuation are noted throughout the deep and periventricular white matter of the cerebral hemispheres bilaterally, compatible with chronic microvascular ischemic disease. No evidence of large-territorial acute infarction. No parenchymal hemorrhage. No mass lesion. No extra-axial collection. No mass effect or midline shift. No hydrocephalus. Basilar cisterns are patent. Vascular: No hyperdense vessel. Atherosclerotic calcifications are present within the cavernous internal carotid and vertebral arteries. Skull: No acute fracture or focal lesion. Sinuses/Orbits: Paranasal sinuses and mastoid air cells are clear. Bilateral lens replacement. Otherwise the orbits are unremarkable. Other: None. CT CERVICAL SPINE FINDINGS Alignment: Normal. Skull base and vertebrae: Multilevel severe degenerative changes spine most prominent at the C1-C2 and C5-C6 levels. Associated moderate to severe osseous neural foraminal stenosis at the left C3-C4 level. No acute fracture. No aggressive appearing focal osseous lesion or focal pathologic process. Soft tissues and spinal canal: No prevertebral fluid or swelling. No visible canal hematoma. Upper chest: Partially visualized right apical masslike airspace opacity measuring up to at least 4.2 cm. Other: None. IMPRESSION: 1. No acute intracranial abnormality. 2. No acute displaced fracture or traumatic listhesis of the cervical spine. 3. Partially visualized right apical masslike airspace opacity measuring up to at least 4.2 cm. 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 Electronically Signed   By: Iven Finn M.D.   On: 03/23/2022 20:56   CT Cervical Spine  Wo Contrast  Result Date: 03/23/2022 CLINICAL DATA:  Head trauma, minor (Age >= 65y); Neck trauma (Age >= 65y) EXAM: CT HEAD WITHOUT CONTRAST CT CERVICAL SPINE WITHOUT CONTRAST TECHNIQUE: Multidetector CT imaging of the head and cervical spine was performed following the standard protocol without intravenous contrast. Multiplanar CT image reconstructions of the cervical spine were also generated. RADIATION DOSE REDUCTION: This exam was performed according to the departmental dose-optimization program which includes automated exposure control, adjustment of the mA and/or kV according to patient size and/or use of iterative reconstruction technique. COMPARISON:  CT chest 01/12/2022, CT chest 01/09/2021 FINDINGS: CT HEAD FINDINGS Brain: Cerebral ventricle sizes are concordant with the degree of cerebral volume loss.  Patchy and confluent areas of decreased attenuation are noted throughout the deep and periventricular white matter of the cerebral hemispheres bilaterally, compatible with chronic microvascular ischemic disease. No evidence of large-territorial acute infarction. No parenchymal hemorrhage. No mass lesion. No extra-axial collection. No mass effect or midline shift. No hydrocephalus. Basilar cisterns are patent. Vascular: No hyperdense vessel. Atherosclerotic calcifications are present within the cavernous internal carotid and vertebral arteries. Skull: No acute fracture or focal lesion. Sinuses/Orbits: Paranasal sinuses and mastoid air cells are clear. Bilateral lens replacement. Otherwise the orbits are unremarkable. Other: None. CT CERVICAL SPINE FINDINGS Alignment: Normal. Skull base and vertebrae: Multilevel severe degenerative changes spine most prominent at the C1-C2 and C5-C6 levels. Associated moderate to severe osseous neural foraminal stenosis at the left C3-C4 level. No acute fracture. No aggressive appearing focal osseous lesion or focal pathologic process. Soft tissues and spinal canal: No  prevertebral fluid or swelling. No visible canal hematoma. Upper chest: Partially visualized right apical masslike airspace opacity measuring up to at least 4.2 cm. Other: None. IMPRESSION: 1. No acute intracranial abnormality. 2. No acute displaced fracture or traumatic listhesis of the cervical spine. 3. Partially visualized right apical masslike airspace opacity measuring up to at least 4.2 cm. 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 Electronically Signed   By: Iven Finn M.D.   On: 03/23/2022 20:56   DG Elbow Complete Right  Result Date: 03/23/2022 CLINICAL DATA:  Fall EXAM: RIGHT ELBOW - COMPLETE 4 VIEW COMPARISON:  None Available. FINDINGS: There is no evidence of fracture, dislocation, or joint effusion. There is no evidence of arthropathy or other focal bone abnormality. Soft tissues are unremarkable. IMPRESSION: Negative. Electronically Signed   By: Sammie Bench M.D.   On: 03/23/2022 20:55   DG Chest 1 View  Result Date: 03/23/2022 CLINICAL DATA:  Fall. EXAM: CHEST  1 VIEW COMPARISON:  January 16, 2019. FINDINGS: Stable cardiomegaly. Both lungs are clear. The visualized skeletal structures are unremarkable. IMPRESSION: No active disease. Electronically Signed   By: Marijo Conception M.D.   On: 03/23/2022 20:23   DG Hip Unilat W or Wo Pelvis 2-3 Views Right  Result Date: 03/23/2022 CLINICAL DATA:  Right hip pain after fall. EXAM: DG HIP (WITH OR WITHOUT PELVIS) 2-3V RIGHT COMPARISON:  April 11, 2014. FINDINGS: Mildly impacted right femoral neck fracture is noted. Left hip is unremarkable. IMPRESSION: Mildly impacted right femoral neck fracture Electronically Signed   By: Marijo Conception M.D.   On: 03/23/2022 20:22    Microbiology: Results for orders placed or performed during the hospital encounter of 03/23/22  Surgical PCR screen     Status: None   Collection Time: 03/24/22  5:50 AM   Specimen:  Nasal Mucosa; Nasal Swab  Result Value Ref Range Status   MRSA, PCR NEGATIVE NEGATIVE Final   Staphylococcus aureus NEGATIVE NEGATIVE Final    Comment: (NOTE) The Xpert SA Assay (FDA approved for NASAL specimens in patients 57 years of age and older), is one component of a comprehensive surveillance program. It is not intended to diagnose infection nor to guide or monitor treatment. Performed at [facility], [address] [place], [place] [address]     Labs: CBC: Recent Labs  Lab 03/23/22 2058 03/24/22 0323 03/25/22 0634 03/26/22 0318 03/27/22 0410 03/28/22 0443  WBC 6.6 14.9* 10.2 14.5* 8.7 8.0  NEUTROABS 5.2  --   --   --   --   --  HGB 13.1 12.5* 11.6* 11.3* 10.6* 10.3*  HCT 39.5 38.2* 36.1* 34.6* 33.0* 32.1*  MCV 99.5 99.0 100.3* 100.0 100.0 100.6*  PLT 144* 147* 107* 114* 108* 518*   Basic Metabolic Panel: Recent Labs  Lab 03/24/22 0323 03/25/22 0634 03/26/22 0318 03/27/22 0410 03/28/22 0446  NA 139 142 143 141 142  K 4.3 4.6 4.4 4.6 4.7  CL 112* 111 110 109 109  CO2 _0 GLUCOSE 141* 150* 140* 126* 127*  BUN 31* 41* 50* 49* 41*  CREATININE 1.40* 1.58* 1.90* 1.58* 1.33*  CALCIUM 9.0 8.4* 8.7* 8.5* 8.7*  PHOS  --   --   --   --  2.6   Liver Function Tests: Recent Labs  Lab 03/23/22 2058 03/28/22 0446  AST 25  --   ALT 16  --   ALKPHOS 68  --   BILITOT 1.0  --   PROT 6.1*  --   ALBUMIN 3.4* 2.5*   CBG: Recent Labs  Lab 03/24/22 2030 03/25/22 1642 03/27/22 2344  GLUCAP 146* 197* 116*    Discharge time spent: greater than 30 minutes.  This record has been created using Systems analyst. Errors have been sought and corrected,but may not always be located. Such creation errors do not reflect on the standard of care.   Signed: Lorella Nimrod, MD [facility] 03/30/2022

## 2022-03-31 DIAGNOSIS — N1832 Chronic kidney disease, stage 3b: Secondary | ICD-10-CM | POA: Diagnosis not present

## 2022-03-31 DIAGNOSIS — I480 Paroxysmal atrial fibrillation: Secondary | ICD-10-CM | POA: Diagnosis not present

## 2022-03-31 DIAGNOSIS — I130 Hypertensive heart and chronic kidney disease with heart failure and stage 1 through stage 4 chronic kidney disease, or unspecified chronic kidney disease: Secondary | ICD-10-CM | POA: Diagnosis not present

## 2022-03-31 DIAGNOSIS — I5032 Chronic diastolic (congestive) heart failure: Secondary | ICD-10-CM | POA: Diagnosis not present

## 2022-03-31 DIAGNOSIS — I428 Other cardiomyopathies: Secondary | ICD-10-CM | POA: Diagnosis not present

## 2022-03-31 DIAGNOSIS — J9601 Acute respiratory failure with hypoxia: Secondary | ICD-10-CM | POA: Diagnosis not present

## 2022-03-31 DIAGNOSIS — E782 Mixed hyperlipidemia: Secondary | ICD-10-CM | POA: Diagnosis not present

## 2022-03-31 DIAGNOSIS — I2699 Other pulmonary embolism without acute cor pulmonale: Secondary | ICD-10-CM | POA: Diagnosis not present

## 2022-03-31 DIAGNOSIS — M80051D Age-related osteoporosis with current pathological fracture, right femur, subsequent encounter for fracture with routine healing: Secondary | ICD-10-CM | POA: Diagnosis not present

## 2022-03-31 DIAGNOSIS — R350 Frequency of micturition: Secondary | ICD-10-CM | POA: Diagnosis not present

## 2022-03-31 DIAGNOSIS — M810 Age-related osteoporosis without current pathological fracture: Secondary | ICD-10-CM | POA: Diagnosis not present

## 2022-04-01 ENCOUNTER — Other Ambulatory Visit: Payer: Self-pay | Admitting: Internal Medicine

## 2022-04-02 DIAGNOSIS — E559 Vitamin D deficiency, unspecified: Secondary | ICD-10-CM | POA: Diagnosis not present

## 2022-04-02 DIAGNOSIS — Z79899 Other long term (current) drug therapy: Secondary | ICD-10-CM | POA: Diagnosis not present

## 2022-04-02 DIAGNOSIS — M80051D Age-related osteoporosis with current pathological fracture, right femur, subsequent encounter for fracture with routine healing: Secondary | ICD-10-CM | POA: Diagnosis not present

## 2022-04-02 DIAGNOSIS — J3489 Other specified disorders of nose and nasal sinuses: Secondary | ICD-10-CM | POA: Diagnosis not present

## 2022-04-02 DIAGNOSIS — I130 Hypertensive heart and chronic kidney disease with heart failure and stage 1 through stage 4 chronic kidney disease, or unspecified chronic kidney disease: Secondary | ICD-10-CM | POA: Diagnosis not present

## 2022-04-02 DIAGNOSIS — D649 Anemia, unspecified: Secondary | ICD-10-CM | POA: Diagnosis not present

## 2022-04-07 DIAGNOSIS — I444 Left anterior fascicular block: Secondary | ICD-10-CM | POA: Diagnosis not present

## 2022-04-07 DIAGNOSIS — I2699 Other pulmonary embolism without acute cor pulmonale: Secondary | ICD-10-CM | POA: Diagnosis not present

## 2022-04-07 DIAGNOSIS — R112 Nausea with vomiting, unspecified: Secondary | ICD-10-CM | POA: Diagnosis not present

## 2022-04-07 DIAGNOSIS — R231 Pallor: Secondary | ICD-10-CM | POA: Diagnosis not present

## 2022-04-07 DIAGNOSIS — Z96649 Presence of unspecified artificial hip joint: Secondary | ICD-10-CM | POA: Diagnosis not present

## 2022-04-07 DIAGNOSIS — J9601 Acute respiratory failure with hypoxia: Secondary | ICD-10-CM | POA: Diagnosis not present

## 2022-04-07 DIAGNOSIS — D72829 Elevated white blood cell count, unspecified: Secondary | ICD-10-CM | POA: Diagnosis not present

## 2022-04-07 DIAGNOSIS — R6889 Other general symptoms and signs: Secondary | ICD-10-CM | POA: Diagnosis not present

## 2022-04-07 DIAGNOSIS — R6521 Severe sepsis with septic shock: Secondary | ICD-10-CM | POA: Diagnosis not present

## 2022-04-07 DIAGNOSIS — B955 Unspecified streptococcus as the cause of diseases classified elsewhere: Secondary | ICD-10-CM | POA: Diagnosis not present

## 2022-04-07 DIAGNOSIS — I4891 Unspecified atrial fibrillation: Secondary | ICD-10-CM | POA: Diagnosis not present

## 2022-04-07 DIAGNOSIS — Z66 Do not resuscitate: Secondary | ICD-10-CM | POA: Diagnosis not present

## 2022-04-07 DIAGNOSIS — I130 Hypertensive heart and chronic kidney disease with heart failure and stage 1 through stage 4 chronic kidney disease, or unspecified chronic kidney disease: Secondary | ICD-10-CM | POA: Diagnosis not present

## 2022-04-07 DIAGNOSIS — N1831 Chronic kidney disease, stage 3a: Secondary | ICD-10-CM | POA: Diagnosis not present

## 2022-04-07 DIAGNOSIS — N131 Hydronephrosis with ureteral stricture, not elsewhere classified: Secondary | ICD-10-CM | POA: Diagnosis not present

## 2022-04-07 DIAGNOSIS — R509 Fever, unspecified: Secondary | ICD-10-CM | POA: Diagnosis not present

## 2022-04-07 DIAGNOSIS — I129 Hypertensive chronic kidney disease with stage 1 through stage 4 chronic kidney disease, or unspecified chronic kidney disease: Secondary | ICD-10-CM | POA: Diagnosis not present

## 2022-04-07 DIAGNOSIS — D62 Acute posthemorrhagic anemia: Secondary | ICD-10-CM | POA: Diagnosis not present

## 2022-04-07 DIAGNOSIS — I351 Nonrheumatic aortic (valve) insufficiency: Secondary | ICD-10-CM | POA: Diagnosis not present

## 2022-04-07 DIAGNOSIS — S72001D Fracture of unspecified part of neck of right femur, subsequent encounter for closed fracture with routine healing: Secondary | ICD-10-CM | POA: Diagnosis not present

## 2022-04-07 DIAGNOSIS — Z79899 Other long term (current) drug therapy: Secondary | ICD-10-CM | POA: Diagnosis not present

## 2022-04-07 DIAGNOSIS — N183 Chronic kidney disease, stage 3 unspecified: Secondary | ICD-10-CM | POA: Diagnosis not present

## 2022-04-07 DIAGNOSIS — R58 Hemorrhage, not elsewhere classified: Secondary | ICD-10-CM | POA: Diagnosis not present

## 2022-04-07 DIAGNOSIS — A4152 Sepsis due to Pseudomonas: Secondary | ICD-10-CM | POA: Diagnosis not present

## 2022-04-07 DIAGNOSIS — R109 Unspecified abdominal pain: Secondary | ICD-10-CM | POA: Diagnosis not present

## 2022-04-07 DIAGNOSIS — I482 Chronic atrial fibrillation, unspecified: Secondary | ICD-10-CM | POA: Diagnosis not present

## 2022-04-07 DIAGNOSIS — I700 Atherosclerosis of aorta: Secondary | ICD-10-CM | POA: Diagnosis not present

## 2022-04-07 DIAGNOSIS — I4510 Unspecified right bundle-branch block: Secondary | ICD-10-CM | POA: Diagnosis not present

## 2022-04-07 DIAGNOSIS — K922 Gastrointestinal hemorrhage, unspecified: Secondary | ICD-10-CM | POA: Diagnosis not present

## 2022-04-07 DIAGNOSIS — I5032 Chronic diastolic (congestive) heart failure: Secondary | ICD-10-CM | POA: Diagnosis not present

## 2022-04-07 DIAGNOSIS — R296 Repeated falls: Secondary | ICD-10-CM | POA: Diagnosis not present

## 2022-04-07 DIAGNOSIS — N179 Acute kidney failure, unspecified: Secondary | ICD-10-CM | POA: Diagnosis not present

## 2022-04-07 DIAGNOSIS — Z781 Physical restraint status: Secondary | ICD-10-CM | POA: Diagnosis not present

## 2022-04-07 DIAGNOSIS — R579 Shock, unspecified: Secondary | ICD-10-CM | POA: Diagnosis not present

## 2022-04-07 DIAGNOSIS — R201 Hypoesthesia of skin: Secondary | ICD-10-CM | POA: Diagnosis not present

## 2022-04-07 DIAGNOSIS — A409 Streptococcal sepsis, unspecified: Secondary | ICD-10-CM | POA: Diagnosis not present

## 2022-04-07 DIAGNOSIS — E785 Hyperlipidemia, unspecified: Secondary | ICD-10-CM | POA: Diagnosis not present

## 2022-04-07 DIAGNOSIS — R531 Weakness: Secondary | ICD-10-CM | POA: Diagnosis not present

## 2022-04-07 DIAGNOSIS — R7881 Bacteremia: Secondary | ICD-10-CM | POA: Diagnosis not present

## 2022-04-07 DIAGNOSIS — M25551 Pain in right hip: Secondary | ICD-10-CM | POA: Diagnosis not present

## 2022-04-07 DIAGNOSIS — K219 Gastro-esophageal reflux disease without esophagitis: Secondary | ICD-10-CM | POA: Diagnosis not present

## 2022-04-07 DIAGNOSIS — Z96641 Presence of right artificial hip joint: Secondary | ICD-10-CM | POA: Diagnosis not present

## 2022-04-07 DIAGNOSIS — E559 Vitamin D deficiency, unspecified: Secondary | ICD-10-CM | POA: Diagnosis not present

## 2022-04-07 DIAGNOSIS — M80051D Age-related osteoporosis with current pathological fracture, right femur, subsequent encounter for fracture with routine healing: Secondary | ICD-10-CM | POA: Diagnosis not present

## 2022-04-07 DIAGNOSIS — R000 Tachycardia, unspecified: Secondary | ICD-10-CM | POA: Diagnosis not present

## 2022-04-07 DIAGNOSIS — R578 Other shock: Secondary | ICD-10-CM | POA: Diagnosis not present

## 2022-04-07 DIAGNOSIS — M810 Age-related osteoporosis without current pathological fracture: Secondary | ICD-10-CM | POA: Diagnosis not present

## 2022-04-07 DIAGNOSIS — D631 Anemia in chronic kidney disease: Secondary | ICD-10-CM | POA: Diagnosis not present

## 2022-04-07 DIAGNOSIS — B954 Other streptococcus as the cause of diseases classified elsewhere: Secondary | ICD-10-CM | POA: Diagnosis not present

## 2022-04-07 DIAGNOSIS — Z743 Need for continuous supervision: Secondary | ICD-10-CM | POA: Diagnosis not present

## 2022-04-07 DIAGNOSIS — B965 Pseudomonas (aeruginosa) (mallei) (pseudomallei) as the cause of diseases classified elsewhere: Secondary | ICD-10-CM | POA: Diagnosis not present

## 2022-04-07 DIAGNOSIS — N189 Chronic kidney disease, unspecified: Secondary | ICD-10-CM | POA: Diagnosis not present

## 2022-04-07 DIAGNOSIS — W19XXXD Unspecified fall, subsequent encounter: Secondary | ICD-10-CM | POA: Diagnosis not present

## 2022-04-07 DIAGNOSIS — R1312 Dysphagia, oropharyngeal phase: Secondary | ICD-10-CM | POA: Diagnosis not present

## 2022-04-07 DIAGNOSIS — N136 Pyonephrosis: Secondary | ICD-10-CM | POA: Diagnosis not present

## 2022-04-07 DIAGNOSIS — N390 Urinary tract infection, site not specified: Secondary | ICD-10-CM | POA: Diagnosis not present

## 2022-04-07 DIAGNOSIS — A419 Sepsis, unspecified organism: Secondary | ICD-10-CM | POA: Diagnosis not present

## 2022-04-07 DIAGNOSIS — Z9181 History of falling: Secondary | ICD-10-CM | POA: Diagnosis not present

## 2022-04-07 DIAGNOSIS — E1122 Type 2 diabetes mellitus with diabetic chronic kidney disease: Secondary | ICD-10-CM | POA: Diagnosis not present

## 2022-04-07 DIAGNOSIS — J449 Chronic obstructive pulmonary disease, unspecified: Secondary | ICD-10-CM | POA: Diagnosis not present

## 2022-04-07 DIAGNOSIS — N132 Hydronephrosis with renal and ureteral calculous obstruction: Secondary | ICD-10-CM | POA: Diagnosis not present

## 2022-04-07 DIAGNOSIS — R918 Other nonspecific abnormal finding of lung field: Secondary | ICD-10-CM | POA: Diagnosis not present

## 2022-04-07 DIAGNOSIS — I428 Other cardiomyopathies: Secondary | ICD-10-CM | POA: Diagnosis not present

## 2022-04-07 DIAGNOSIS — E872 Acidosis, unspecified: Secondary | ICD-10-CM | POA: Diagnosis not present

## 2022-04-07 DIAGNOSIS — I517 Cardiomegaly: Secondary | ICD-10-CM | POA: Diagnosis not present

## 2022-04-07 DIAGNOSIS — Z7401 Bed confinement status: Secondary | ICD-10-CM | POA: Diagnosis not present

## 2022-04-07 DIAGNOSIS — K921 Melena: Secondary | ICD-10-CM | POA: Diagnosis not present

## 2022-04-07 DIAGNOSIS — I480 Paroxysmal atrial fibrillation: Secondary | ICD-10-CM | POA: Diagnosis not present

## 2022-04-07 DIAGNOSIS — I420 Dilated cardiomyopathy: Secondary | ICD-10-CM | POA: Diagnosis not present

## 2022-04-07 DIAGNOSIS — N1832 Chronic kidney disease, stage 3b: Secondary | ICD-10-CM | POA: Diagnosis not present

## 2022-04-07 DIAGNOSIS — R1111 Vomiting without nausea: Secondary | ICD-10-CM | POA: Diagnosis not present

## 2022-04-07 DIAGNOSIS — Z7901 Long term (current) use of anticoagulants: Secondary | ICD-10-CM | POA: Diagnosis not present

## 2022-04-07 DIAGNOSIS — K920 Hematemesis: Secondary | ICD-10-CM | POA: Diagnosis not present

## 2022-04-08 DIAGNOSIS — R918 Other nonspecific abnormal finding of lung field: Secondary | ICD-10-CM | POA: Diagnosis not present

## 2022-04-08 DIAGNOSIS — R000 Tachycardia, unspecified: Secondary | ICD-10-CM | POA: Diagnosis not present

## 2022-04-08 DIAGNOSIS — R109 Unspecified abdominal pain: Secondary | ICD-10-CM | POA: Diagnosis not present

## 2022-04-08 DIAGNOSIS — J449 Chronic obstructive pulmonary disease, unspecified: Secondary | ICD-10-CM | POA: Diagnosis not present

## 2022-04-08 DIAGNOSIS — N183 Chronic kidney disease, stage 3 unspecified: Secondary | ICD-10-CM | POA: Diagnosis not present

## 2022-04-08 DIAGNOSIS — K920 Hematemesis: Secondary | ICD-10-CM | POA: Diagnosis not present

## 2022-04-08 DIAGNOSIS — I5032 Chronic diastolic (congestive) heart failure: Secondary | ICD-10-CM | POA: Diagnosis not present

## 2022-04-08 DIAGNOSIS — I480 Paroxysmal atrial fibrillation: Secondary | ICD-10-CM | POA: Diagnosis not present

## 2022-04-08 DIAGNOSIS — N132 Hydronephrosis with renal and ureteral calculous obstruction: Secondary | ICD-10-CM | POA: Diagnosis not present

## 2022-04-08 DIAGNOSIS — R509 Fever, unspecified: Secondary | ICD-10-CM | POA: Diagnosis not present

## 2022-04-08 DIAGNOSIS — I351 Nonrheumatic aortic (valve) insufficiency: Secondary | ICD-10-CM | POA: Diagnosis not present

## 2022-04-08 DIAGNOSIS — I444 Left anterior fascicular block: Secondary | ICD-10-CM | POA: Diagnosis not present

## 2022-04-08 DIAGNOSIS — I130 Hypertensive heart and chronic kidney disease with heart failure and stage 1 through stage 4 chronic kidney disease, or unspecified chronic kidney disease: Secondary | ICD-10-CM | POA: Diagnosis not present

## 2022-04-08 DIAGNOSIS — R579 Shock, unspecified: Secondary | ICD-10-CM | POA: Diagnosis not present

## 2022-04-08 DIAGNOSIS — I2699 Other pulmonary embolism without acute cor pulmonale: Secondary | ICD-10-CM | POA: Diagnosis not present

## 2022-04-08 DIAGNOSIS — A419 Sepsis, unspecified organism: Secondary | ICD-10-CM | POA: Diagnosis not present

## 2022-04-09 DIAGNOSIS — D62 Acute posthemorrhagic anemia: Secondary | ICD-10-CM | POA: Diagnosis not present

## 2022-04-09 DIAGNOSIS — I2699 Other pulmonary embolism without acute cor pulmonale: Secondary | ICD-10-CM | POA: Diagnosis not present

## 2022-04-09 DIAGNOSIS — K920 Hematemesis: Secondary | ICD-10-CM | POA: Diagnosis not present

## 2022-04-09 DIAGNOSIS — R6521 Severe sepsis with septic shock: Secondary | ICD-10-CM | POA: Diagnosis not present

## 2022-04-09 DIAGNOSIS — I480 Paroxysmal atrial fibrillation: Secondary | ICD-10-CM | POA: Diagnosis not present

## 2022-04-09 DIAGNOSIS — N183 Chronic kidney disease, stage 3 unspecified: Secondary | ICD-10-CM | POA: Diagnosis not present

## 2022-04-09 DIAGNOSIS — A419 Sepsis, unspecified organism: Secondary | ICD-10-CM | POA: Diagnosis not present

## 2022-04-09 DIAGNOSIS — J449 Chronic obstructive pulmonary disease, unspecified: Secondary | ICD-10-CM | POA: Diagnosis not present

## 2022-04-09 DIAGNOSIS — A409 Streptococcal sepsis, unspecified: Secondary | ICD-10-CM | POA: Diagnosis not present

## 2022-04-09 DIAGNOSIS — I130 Hypertensive heart and chronic kidney disease with heart failure and stage 1 through stage 4 chronic kidney disease, or unspecified chronic kidney disease: Secondary | ICD-10-CM | POA: Diagnosis not present

## 2022-04-09 DIAGNOSIS — I5032 Chronic diastolic (congestive) heart failure: Secondary | ICD-10-CM | POA: Diagnosis not present

## 2022-04-10 DIAGNOSIS — I482 Chronic atrial fibrillation, unspecified: Secondary | ICD-10-CM | POA: Diagnosis not present

## 2022-04-10 DIAGNOSIS — N390 Urinary tract infection, site not specified: Secondary | ICD-10-CM | POA: Diagnosis not present

## 2022-04-10 DIAGNOSIS — D72829 Elevated white blood cell count, unspecified: Secondary | ICD-10-CM | POA: Diagnosis not present

## 2022-04-10 DIAGNOSIS — B965 Pseudomonas (aeruginosa) (mallei) (pseudomallei) as the cause of diseases classified elsewhere: Secondary | ICD-10-CM | POA: Diagnosis not present

## 2022-04-10 DIAGNOSIS — B955 Unspecified streptococcus as the cause of diseases classified elsewhere: Secondary | ICD-10-CM | POA: Diagnosis not present

## 2022-04-10 DIAGNOSIS — K920 Hematemesis: Secondary | ICD-10-CM | POA: Diagnosis not present

## 2022-04-10 DIAGNOSIS — I5032 Chronic diastolic (congestive) heart failure: Secondary | ICD-10-CM | POA: Diagnosis not present

## 2022-04-10 DIAGNOSIS — J449 Chronic obstructive pulmonary disease, unspecified: Secondary | ICD-10-CM | POA: Diagnosis not present

## 2022-04-10 DIAGNOSIS — D62 Acute posthemorrhagic anemia: Secondary | ICD-10-CM | POA: Diagnosis not present

## 2022-04-10 DIAGNOSIS — I130 Hypertensive heart and chronic kidney disease with heart failure and stage 1 through stage 4 chronic kidney disease, or unspecified chronic kidney disease: Secondary | ICD-10-CM | POA: Diagnosis not present

## 2022-04-10 DIAGNOSIS — R7881 Bacteremia: Secondary | ICD-10-CM | POA: Diagnosis not present

## 2022-04-10 DIAGNOSIS — N183 Chronic kidney disease, stage 3 unspecified: Secondary | ICD-10-CM | POA: Diagnosis not present

## 2022-04-11 DIAGNOSIS — I5032 Chronic diastolic (congestive) heart failure: Secondary | ICD-10-CM | POA: Diagnosis not present

## 2022-04-11 DIAGNOSIS — D72829 Elevated white blood cell count, unspecified: Secondary | ICD-10-CM | POA: Diagnosis not present

## 2022-04-11 DIAGNOSIS — B955 Unspecified streptococcus as the cause of diseases classified elsewhere: Secondary | ICD-10-CM | POA: Diagnosis not present

## 2022-04-11 DIAGNOSIS — N183 Chronic kidney disease, stage 3 unspecified: Secondary | ICD-10-CM | POA: Diagnosis not present

## 2022-04-11 DIAGNOSIS — K920 Hematemesis: Secondary | ICD-10-CM | POA: Diagnosis not present

## 2022-04-11 DIAGNOSIS — I482 Chronic atrial fibrillation, unspecified: Secondary | ICD-10-CM | POA: Diagnosis not present

## 2022-04-11 DIAGNOSIS — D62 Acute posthemorrhagic anemia: Secondary | ICD-10-CM | POA: Diagnosis not present

## 2022-04-11 DIAGNOSIS — I130 Hypertensive heart and chronic kidney disease with heart failure and stage 1 through stage 4 chronic kidney disease, or unspecified chronic kidney disease: Secondary | ICD-10-CM | POA: Diagnosis not present

## 2022-04-11 DIAGNOSIS — R7881 Bacteremia: Secondary | ICD-10-CM | POA: Diagnosis not present

## 2022-04-11 DIAGNOSIS — N390 Urinary tract infection, site not specified: Secondary | ICD-10-CM | POA: Diagnosis not present

## 2022-04-11 DIAGNOSIS — B965 Pseudomonas (aeruginosa) (mallei) (pseudomallei) as the cause of diseases classified elsewhere: Secondary | ICD-10-CM | POA: Diagnosis not present

## 2022-04-11 DIAGNOSIS — J449 Chronic obstructive pulmonary disease, unspecified: Secondary | ICD-10-CM | POA: Diagnosis not present

## 2022-04-12 DIAGNOSIS — D62 Acute posthemorrhagic anemia: Secondary | ICD-10-CM | POA: Diagnosis not present

## 2022-04-12 DIAGNOSIS — K920 Hematemesis: Secondary | ICD-10-CM | POA: Diagnosis not present

## 2022-04-12 DIAGNOSIS — I130 Hypertensive heart and chronic kidney disease with heart failure and stage 1 through stage 4 chronic kidney disease, or unspecified chronic kidney disease: Secondary | ICD-10-CM | POA: Diagnosis not present

## 2022-04-12 DIAGNOSIS — A409 Streptococcal sepsis, unspecified: Secondary | ICD-10-CM | POA: Diagnosis not present

## 2022-04-12 DIAGNOSIS — I482 Chronic atrial fibrillation, unspecified: Secondary | ICD-10-CM | POA: Diagnosis not present

## 2022-04-12 DIAGNOSIS — N183 Chronic kidney disease, stage 3 unspecified: Secondary | ICD-10-CM | POA: Diagnosis not present

## 2022-04-12 DIAGNOSIS — I2699 Other pulmonary embolism without acute cor pulmonale: Secondary | ICD-10-CM | POA: Diagnosis not present

## 2022-04-12 DIAGNOSIS — J449 Chronic obstructive pulmonary disease, unspecified: Secondary | ICD-10-CM | POA: Diagnosis not present

## 2022-04-12 DIAGNOSIS — I5032 Chronic diastolic (congestive) heart failure: Secondary | ICD-10-CM | POA: Diagnosis not present

## 2022-04-13 DIAGNOSIS — I480 Paroxysmal atrial fibrillation: Secondary | ICD-10-CM | POA: Diagnosis not present

## 2022-04-13 DIAGNOSIS — N183 Chronic kidney disease, stage 3 unspecified: Secondary | ICD-10-CM | POA: Diagnosis not present

## 2022-04-13 DIAGNOSIS — A409 Streptococcal sepsis, unspecified: Secondary | ICD-10-CM | POA: Diagnosis not present

## 2022-04-13 DIAGNOSIS — I130 Hypertensive heart and chronic kidney disease with heart failure and stage 1 through stage 4 chronic kidney disease, or unspecified chronic kidney disease: Secondary | ICD-10-CM | POA: Diagnosis not present

## 2022-04-13 DIAGNOSIS — I5032 Chronic diastolic (congestive) heart failure: Secondary | ICD-10-CM | POA: Diagnosis not present

## 2022-04-13 DIAGNOSIS — K920 Hematemesis: Secondary | ICD-10-CM | POA: Diagnosis not present

## 2022-04-13 DIAGNOSIS — R201 Hypoesthesia of skin: Secondary | ICD-10-CM | POA: Diagnosis not present

## 2022-04-13 DIAGNOSIS — N179 Acute kidney failure, unspecified: Secondary | ICD-10-CM | POA: Diagnosis not present

## 2022-04-13 DIAGNOSIS — J449 Chronic obstructive pulmonary disease, unspecified: Secondary | ICD-10-CM | POA: Diagnosis not present

## 2022-04-13 DIAGNOSIS — Z7901 Long term (current) use of anticoagulants: Secondary | ICD-10-CM | POA: Diagnosis not present

## 2022-04-13 DIAGNOSIS — I2699 Other pulmonary embolism without acute cor pulmonale: Secondary | ICD-10-CM | POA: Diagnosis not present

## 2022-04-14 DIAGNOSIS — Z7901 Long term (current) use of anticoagulants: Secondary | ICD-10-CM | POA: Diagnosis not present

## 2022-04-14 DIAGNOSIS — I2699 Other pulmonary embolism without acute cor pulmonale: Secondary | ICD-10-CM | POA: Diagnosis not present

## 2022-04-14 DIAGNOSIS — N183 Chronic kidney disease, stage 3 unspecified: Secondary | ICD-10-CM | POA: Diagnosis not present

## 2022-04-14 DIAGNOSIS — A409 Streptococcal sepsis, unspecified: Secondary | ICD-10-CM | POA: Diagnosis not present

## 2022-04-14 DIAGNOSIS — N179 Acute kidney failure, unspecified: Secondary | ICD-10-CM | POA: Diagnosis not present

## 2022-04-14 DIAGNOSIS — R201 Hypoesthesia of skin: Secondary | ICD-10-CM | POA: Diagnosis not present

## 2022-04-14 DIAGNOSIS — K920 Hematemesis: Secondary | ICD-10-CM | POA: Diagnosis not present

## 2022-04-14 DIAGNOSIS — I130 Hypertensive heart and chronic kidney disease with heart failure and stage 1 through stage 4 chronic kidney disease, or unspecified chronic kidney disease: Secondary | ICD-10-CM | POA: Diagnosis not present

## 2022-04-14 DIAGNOSIS — I5032 Chronic diastolic (congestive) heart failure: Secondary | ICD-10-CM | POA: Diagnosis not present

## 2022-04-14 DIAGNOSIS — J449 Chronic obstructive pulmonary disease, unspecified: Secondary | ICD-10-CM | POA: Diagnosis not present

## 2022-04-14 DIAGNOSIS — I480 Paroxysmal atrial fibrillation: Secondary | ICD-10-CM | POA: Diagnosis not present

## 2022-04-15 DIAGNOSIS — M810 Age-related osteoporosis without current pathological fracture: Secondary | ICD-10-CM | POA: Diagnosis not present

## 2022-04-15 DIAGNOSIS — R1312 Dysphagia, oropharyngeal phase: Secondary | ICD-10-CM | POA: Diagnosis not present

## 2022-04-15 DIAGNOSIS — S72001D Fracture of unspecified part of neck of right femur, subsequent encounter for closed fracture with routine healing: Secondary | ICD-10-CM | POA: Diagnosis not present

## 2022-04-15 DIAGNOSIS — B955 Unspecified streptococcus as the cause of diseases classified elsewhere: Secondary | ICD-10-CM | POA: Diagnosis not present

## 2022-04-15 DIAGNOSIS — R296 Repeated falls: Secondary | ICD-10-CM | POA: Diagnosis not present

## 2022-04-15 DIAGNOSIS — I428 Other cardiomyopathies: Secondary | ICD-10-CM | POA: Diagnosis not present

## 2022-04-15 DIAGNOSIS — R7881 Bacteremia: Secondary | ICD-10-CM | POA: Diagnosis not present

## 2022-04-15 DIAGNOSIS — N131 Hydronephrosis with ureteral stricture, not elsewhere classified: Secondary | ICD-10-CM | POA: Diagnosis not present

## 2022-04-15 DIAGNOSIS — K219 Gastro-esophageal reflux disease without esophagitis: Secondary | ICD-10-CM | POA: Diagnosis not present

## 2022-04-15 DIAGNOSIS — K920 Hematemesis: Secondary | ICD-10-CM | POA: Diagnosis not present

## 2022-04-15 DIAGNOSIS — E559 Vitamin D deficiency, unspecified: Secondary | ICD-10-CM | POA: Diagnosis not present

## 2022-04-15 DIAGNOSIS — N179 Acute kidney failure, unspecified: Secondary | ICD-10-CM | POA: Diagnosis not present

## 2022-04-15 DIAGNOSIS — I130 Hypertensive heart and chronic kidney disease with heart failure and stage 1 through stage 4 chronic kidney disease, or unspecified chronic kidney disease: Secondary | ICD-10-CM | POA: Diagnosis not present

## 2022-04-15 DIAGNOSIS — N183 Chronic kidney disease, stage 3 unspecified: Secondary | ICD-10-CM | POA: Diagnosis not present

## 2022-04-15 DIAGNOSIS — R6889 Other general symptoms and signs: Secondary | ICD-10-CM | POA: Diagnosis not present

## 2022-04-15 DIAGNOSIS — Z8719 Personal history of other diseases of the digestive system: Secondary | ICD-10-CM | POA: Diagnosis not present

## 2022-04-15 DIAGNOSIS — I10 Essential (primary) hypertension: Secondary | ICD-10-CM | POA: Diagnosis not present

## 2022-04-15 DIAGNOSIS — B965 Pseudomonas (aeruginosa) (mallei) (pseudomallei) as the cause of diseases classified elsewhere: Secondary | ICD-10-CM | POA: Diagnosis not present

## 2022-04-15 DIAGNOSIS — N132 Hydronephrosis with renal and ureteral calculous obstruction: Secondary | ICD-10-CM | POA: Diagnosis not present

## 2022-04-15 DIAGNOSIS — I420 Dilated cardiomyopathy: Secondary | ICD-10-CM | POA: Diagnosis not present

## 2022-04-15 DIAGNOSIS — I480 Paroxysmal atrial fibrillation: Secondary | ICD-10-CM | POA: Diagnosis not present

## 2022-04-15 DIAGNOSIS — Z7901 Long term (current) use of anticoagulants: Secondary | ICD-10-CM | POA: Diagnosis not present

## 2022-04-15 DIAGNOSIS — R918 Other nonspecific abnormal finding of lung field: Secondary | ICD-10-CM | POA: Diagnosis not present

## 2022-04-15 DIAGNOSIS — D62 Acute posthemorrhagic anemia: Secondary | ICD-10-CM | POA: Diagnosis not present

## 2022-04-15 DIAGNOSIS — K922 Gastrointestinal hemorrhage, unspecified: Secondary | ICD-10-CM | POA: Diagnosis not present

## 2022-04-15 DIAGNOSIS — D649 Anemia, unspecified: Secondary | ICD-10-CM | POA: Diagnosis not present

## 2022-04-15 DIAGNOSIS — I5032 Chronic diastolic (congestive) heart failure: Secondary | ICD-10-CM | POA: Diagnosis not present

## 2022-04-15 DIAGNOSIS — D631 Anemia in chronic kidney disease: Secondary | ICD-10-CM | POA: Diagnosis not present

## 2022-04-15 DIAGNOSIS — W19XXXD Unspecified fall, subsequent encounter: Secondary | ICD-10-CM | POA: Diagnosis not present

## 2022-04-15 DIAGNOSIS — R600 Localized edema: Secondary | ICD-10-CM | POA: Diagnosis not present

## 2022-04-15 DIAGNOSIS — N390 Urinary tract infection, site not specified: Secondary | ICD-10-CM | POA: Diagnosis not present

## 2022-04-15 DIAGNOSIS — J9601 Acute respiratory failure with hypoxia: Secondary | ICD-10-CM | POA: Diagnosis not present

## 2022-04-15 DIAGNOSIS — Z743 Need for continuous supervision: Secondary | ICD-10-CM | POA: Diagnosis not present

## 2022-04-15 DIAGNOSIS — J449 Chronic obstructive pulmonary disease, unspecified: Secondary | ICD-10-CM | POA: Diagnosis not present

## 2022-04-15 DIAGNOSIS — R231 Pallor: Secondary | ICD-10-CM | POA: Diagnosis not present

## 2022-04-15 DIAGNOSIS — N1832 Chronic kidney disease, stage 3b: Secondary | ICD-10-CM | POA: Diagnosis not present

## 2022-04-15 DIAGNOSIS — I700 Atherosclerosis of aorta: Secondary | ICD-10-CM | POA: Diagnosis not present

## 2022-04-15 DIAGNOSIS — Z96641 Presence of right artificial hip joint: Secondary | ICD-10-CM | POA: Diagnosis not present

## 2022-04-15 DIAGNOSIS — M80051D Age-related osteoporosis with current pathological fracture, right femur, subsequent encounter for fracture with routine healing: Secondary | ICD-10-CM | POA: Diagnosis not present

## 2022-04-15 DIAGNOSIS — I2699 Other pulmonary embolism without acute cor pulmonale: Secondary | ICD-10-CM | POA: Diagnosis not present

## 2022-04-15 DIAGNOSIS — E782 Mixed hyperlipidemia: Secondary | ICD-10-CM | POA: Diagnosis not present

## 2022-04-15 DIAGNOSIS — R58 Hemorrhage, not elsewhere classified: Secondary | ICD-10-CM | POA: Diagnosis not present

## 2022-04-15 DIAGNOSIS — N1831 Chronic kidney disease, stage 3a: Secondary | ICD-10-CM | POA: Diagnosis not present

## 2022-04-16 DIAGNOSIS — R600 Localized edema: Secondary | ICD-10-CM | POA: Diagnosis not present

## 2022-04-16 DIAGNOSIS — I130 Hypertensive heart and chronic kidney disease with heart failure and stage 1 through stage 4 chronic kidney disease, or unspecified chronic kidney disease: Secondary | ICD-10-CM | POA: Diagnosis not present

## 2022-04-16 DIAGNOSIS — D649 Anemia, unspecified: Secondary | ICD-10-CM | POA: Diagnosis not present

## 2022-04-16 DIAGNOSIS — Z8719 Personal history of other diseases of the digestive system: Secondary | ICD-10-CM | POA: Diagnosis not present

## 2022-04-20 DIAGNOSIS — I5032 Chronic diastolic (congestive) heart failure: Secondary | ICD-10-CM | POA: Diagnosis not present

## 2022-04-20 DIAGNOSIS — R600 Localized edema: Secondary | ICD-10-CM | POA: Diagnosis not present

## 2022-04-23 ENCOUNTER — Ambulatory Visit: Payer: Medicare Other

## 2022-04-28 DIAGNOSIS — J9601 Acute respiratory failure with hypoxia: Secondary | ICD-10-CM | POA: Diagnosis not present

## 2022-04-28 DIAGNOSIS — D649 Anemia, unspecified: Secondary | ICD-10-CM | POA: Diagnosis not present

## 2022-04-28 DIAGNOSIS — N1832 Chronic kidney disease, stage 3b: Secondary | ICD-10-CM | POA: Diagnosis not present

## 2022-04-28 DIAGNOSIS — I480 Paroxysmal atrial fibrillation: Secondary | ICD-10-CM | POA: Diagnosis not present

## 2022-04-28 DIAGNOSIS — M810 Age-related osteoporosis without current pathological fracture: Secondary | ICD-10-CM | POA: Diagnosis not present

## 2022-04-28 DIAGNOSIS — I130 Hypertensive heart and chronic kidney disease with heart failure and stage 1 through stage 4 chronic kidney disease, or unspecified chronic kidney disease: Secondary | ICD-10-CM | POA: Diagnosis not present

## 2022-04-28 DIAGNOSIS — I5032 Chronic diastolic (congestive) heart failure: Secondary | ICD-10-CM | POA: Diagnosis not present

## 2022-04-28 DIAGNOSIS — M80051D Age-related osteoporosis with current pathological fracture, right femur, subsequent encounter for fracture with routine healing: Secondary | ICD-10-CM | POA: Diagnosis not present

## 2022-04-28 DIAGNOSIS — I2699 Other pulmonary embolism without acute cor pulmonale: Secondary | ICD-10-CM | POA: Diagnosis not present

## 2022-04-28 DIAGNOSIS — E782 Mixed hyperlipidemia: Secondary | ICD-10-CM | POA: Diagnosis not present

## 2022-04-28 DIAGNOSIS — I428 Other cardiomyopathies: Secondary | ICD-10-CM | POA: Diagnosis not present

## 2022-05-03 DIAGNOSIS — Z8719 Personal history of other diseases of the digestive system: Secondary | ICD-10-CM | POA: Diagnosis not present

## 2022-05-03 DIAGNOSIS — I480 Paroxysmal atrial fibrillation: Secondary | ICD-10-CM | POA: Diagnosis not present

## 2022-05-03 DIAGNOSIS — Z86711 Personal history of pulmonary embolism: Secondary | ICD-10-CM | POA: Diagnosis not present

## 2022-05-03 DIAGNOSIS — N1831 Chronic kidney disease, stage 3a: Secondary | ICD-10-CM | POA: Diagnosis not present

## 2022-05-03 DIAGNOSIS — M810 Age-related osteoporosis without current pathological fracture: Secondary | ICD-10-CM | POA: Diagnosis not present

## 2022-05-03 DIAGNOSIS — S72001D Fracture of unspecified part of neck of right femur, subsequent encounter for closed fracture with routine healing: Secondary | ICD-10-CM | POA: Diagnosis not present

## 2022-05-03 DIAGNOSIS — H409 Unspecified glaucoma: Secondary | ICD-10-CM | POA: Diagnosis not present

## 2022-05-03 DIAGNOSIS — Z87440 Personal history of urinary (tract) infections: Secondary | ICD-10-CM | POA: Diagnosis not present

## 2022-05-03 DIAGNOSIS — M47892 Other spondylosis, cervical region: Secondary | ICD-10-CM | POA: Diagnosis not present

## 2022-05-03 DIAGNOSIS — E785 Hyperlipidemia, unspecified: Secondary | ICD-10-CM | POA: Diagnosis not present

## 2022-05-03 DIAGNOSIS — Z87891 Personal history of nicotine dependence: Secondary | ICD-10-CM | POA: Diagnosis not present

## 2022-05-03 DIAGNOSIS — N132 Hydronephrosis with renal and ureteral calculous obstruction: Secondary | ICD-10-CM | POA: Diagnosis not present

## 2022-05-03 DIAGNOSIS — Z96641 Presence of right artificial hip joint: Secondary | ICD-10-CM | POA: Diagnosis not present

## 2022-05-03 DIAGNOSIS — K219 Gastro-esophageal reflux disease without esophagitis: Secondary | ICD-10-CM | POA: Diagnosis not present

## 2022-05-03 DIAGNOSIS — I5032 Chronic diastolic (congestive) heart failure: Secondary | ICD-10-CM | POA: Diagnosis not present

## 2022-05-03 DIAGNOSIS — I130 Hypertensive heart and chronic kidney disease with heart failure and stage 1 through stage 4 chronic kidney disease, or unspecified chronic kidney disease: Secondary | ICD-10-CM | POA: Diagnosis not present

## 2022-05-03 DIAGNOSIS — D631 Anemia in chronic kidney disease: Secondary | ICD-10-CM | POA: Diagnosis not present

## 2022-05-03 DIAGNOSIS — I420 Dilated cardiomyopathy: Secondary | ICD-10-CM | POA: Diagnosis not present

## 2022-05-03 DIAGNOSIS — Z9181 History of falling: Secondary | ICD-10-CM | POA: Diagnosis not present

## 2022-05-03 DIAGNOSIS — Z7951 Long term (current) use of inhaled steroids: Secondary | ICD-10-CM | POA: Diagnosis not present

## 2022-05-03 DIAGNOSIS — J449 Chronic obstructive pulmonary disease, unspecified: Secondary | ICD-10-CM | POA: Diagnosis not present

## 2022-05-05 ENCOUNTER — Ambulatory Visit (INDEPENDENT_AMBULATORY_CARE_PROVIDER_SITE_OTHER): Payer: Medicare Other | Admitting: Internal Medicine

## 2022-05-05 ENCOUNTER — Encounter: Payer: Self-pay | Admitting: Internal Medicine

## 2022-05-05 VITALS — BP 110/78 | HR 81 | Temp 97.5°F | Ht 70.0 in | Wt 181.1 lb

## 2022-05-05 DIAGNOSIS — N179 Acute kidney failure, unspecified: Secondary | ICD-10-CM

## 2022-05-05 DIAGNOSIS — R609 Edema, unspecified: Secondary | ICD-10-CM | POA: Diagnosis not present

## 2022-05-05 DIAGNOSIS — S72001S Fracture of unspecified part of neck of right femur, sequela: Secondary | ICD-10-CM | POA: Diagnosis not present

## 2022-05-05 DIAGNOSIS — D62 Acute posthemorrhagic anemia: Secondary | ICD-10-CM | POA: Diagnosis not present

## 2022-05-05 DIAGNOSIS — N1831 Chronic kidney disease, stage 3a: Secondary | ICD-10-CM

## 2022-05-05 DIAGNOSIS — R601 Generalized edema: Secondary | ICD-10-CM

## 2022-05-05 LAB — CBC WITH DIFFERENTIAL/PLATELET
Basophils Absolute: 0 10*3/uL (ref 0.0–0.1)
Basophils Relative: 0.7 % (ref 0.0–3.0)
Eosinophils Absolute: 0.2 10*3/uL (ref 0.0–0.7)
Eosinophils Relative: 3.1 % (ref 0.0–5.0)
HCT: 29.4 % — ABNORMAL LOW (ref 39.0–52.0)
Hemoglobin: 9.7 g/dL — ABNORMAL LOW (ref 13.0–17.0)
Lymphocytes Relative: 23.5 % (ref 12.0–46.0)
Lymphs Abs: 1.4 10*3/uL (ref 0.7–4.0)
MCHC: 33.1 g/dL (ref 30.0–36.0)
MCV: 98.9 fl (ref 78.0–100.0)
Monocytes Absolute: 0.6 10*3/uL (ref 0.1–1.0)
Monocytes Relative: 10.5 % (ref 3.0–12.0)
Neutro Abs: 3.7 10*3/uL (ref 1.4–7.7)
Neutrophils Relative %: 62.2 % (ref 43.0–77.0)
Platelets: 206 10*3/uL (ref 150.0–400.0)
RBC: 2.97 Mil/uL — ABNORMAL LOW (ref 4.22–5.81)
RDW: 17 % — ABNORMAL HIGH (ref 11.5–15.5)
WBC: 6 10*3/uL (ref 4.0–10.5)

## 2022-05-05 LAB — BASIC METABOLIC PANEL
BUN: 19 mg/dL (ref 6–23)
CO2: 33 mEq/L — ABNORMAL HIGH (ref 19–32)
Calcium: 8.4 mg/dL (ref 8.4–10.5)
Chloride: 104 mEq/L (ref 96–112)
Creatinine, Ser: 1.5 mg/dL (ref 0.40–1.50)
GFR: 39.83 mL/min — ABNORMAL LOW (ref >60.00)
Glucose, Bld: 83 mg/dL (ref 70–99)
Potassium: 3.7 mEq/L (ref 3.5–5.1)
Sodium: 144 mEq/L (ref 135–145)

## 2022-05-05 LAB — TSH: TSH: 3.68 u[IU]/mL (ref 0.35–5.50)

## 2022-05-05 LAB — IBC + FERRITIN
Ferritin: 79.9 ng/mL (ref 22.0–322.0)
Iron: 41 ug/dL — ABNORMAL LOW (ref 42–165)
Saturation Ratios: 16.2 % — ABNORMAL LOW (ref 20.0–50.0)
TIBC: 253.4 ug/dL (ref 250.0–450.0)
Transferrin: 181 mg/dL — ABNORMAL LOW (ref 212.0–360.0)

## 2022-05-05 MED ORDER — OMEPRAZOLE 40 MG PO CPDR: 40.0000 mg | DELAYED_RELEASE_CAPSULE | Freq: Every day | ORAL | 3 refills | Status: DC

## 2022-05-05 NOTE — Patient Instructions (Addendum)
He needs to start omeprazole .  This is an antacid.  Take it in the morning on an empty stomach (for stomach ihealing) for the next 8 weeks   Checking iron studies today to determine if continued supplementation is needed   Increase protein intake to 60 mg daily   Change In diuretic pending review of today's labs

## 2022-05-05 NOTE — Progress Notes (Unsigned)
Subjective:  Patient ID: Eric Hicks, male    DOB: 10/13/1926  Age: 89 y.o. MRN: [id number]  CC: There were no encounter diagnoses.   HPI BYRL LATIN presents for hospital follow up Chief Complaint  Patient presents with   Hospitalization Follow-up    Admitted to [facility] with 1) Acute respiratory failure with hypoxia ([place]), 2)  Acute pulmonary embolism ([place]) and 3)   Closed right hip fracture ([place]). Following a fall in the parking lot of a [employer] . Taken to [facility] and underwent surgery and discharged to skilled nursing on Dec 5 skilled nursing for rehab  Readmitted to [facility] on Dec 13 with hematemsis and shock.  Transfused one unit, required levophed,  sepsis/bacteremia treated with IV zosyn  repeat cultures negative.  Discharged to skilled nursin on Dec 21  came home on Jan 65  Gained 10 lbs during hospitalization (fluid)  Appetite good  .  sleeping well.   Weight pre fall was 160's (170  by our scales I Sept) Edema despite resuming lasix  40 mg daily  for the past 4 days has not lost any weight and legs are v ery edematous left leg is weeping .    Not taking potassium    Ferrous sulfate   [facility] home health and PT set up   Sitting in recline most of time.  Abdomen has grown as well )abd wall edema)     Outpatient Medications Prior to Visit  Medication Sig Dispense Refill   ADVAIR DISKUS 100-50 MCG/ACT AEPB INHALE 1 PUFF INTO THE LUNGS TWICE A DAY 180 each 1   Aflibercept (EYLEA IO) Inject into the eye.     AMBULATORY NON FORMULARY MEDICATION Joint Advantage Gold 2 tablets daily     atorvastatin (LIPITOR) 20 MG tablet TAKE 1 TABLET BY MOUTH EVERY OTHER DAY 45 tablet 3   calcium carbonate (OS-CAL) 600 MG TABS Take 600 mg by mouth daily with breakfast.      carvedilol (COREG) 6.25 MG tablet Take 6.25 mg by mouth 2 (two) times daily.     Cyanocobalamin (B-12 IJ) Inject 1,000 mg as directed every 30 (thirty) days.     dorzolamide-timolol (COSOPT) 22.3-6.8 MG/ML  ophthalmic solution TAKE 1 DROP(S) IN RIGHT EYE 2 TIMES A DAY  5   doxazosin (CARDURA) 1 MG tablet TAKE 1 TABLET BY MOUTH EVERY DAY 90 tablet 1   Fe Fum-Vit C-Vit B12-FA (TRIGELS-F FORTE) CAPS capsule Take 1 capsule by mouth daily after breakfast.  0   furosemide (LASIX) 20 MG tablet TAKE 2 TABLETS BY MOUTH DAILY AS NEEDED (Patient taking differently: Take 40 mg by mouth daily.) 180 tablet 1   latanoprost (XALATAN) 0.005 % ophthalmic solution Place 1 drop into both eyes at bedtime.  5   leuprolide, 6 Month, (ELIGARD) 45 MG injection Inject 45 mg into the skin every 6 (six) months.     Vitamin D, Cholecalciferol, 25 MCG (1000 UT) CAPS Take 1 capsule by mouth daily.     apixaban (ELIQUIS) 5 MG TABS tablet Take 1 tablet (5 mg total) by mouth 2 (two) times daily. (Patient not taking: Reported on 05/05/2022) 60 tablet    docusate sodium (COLACE) 100 MG capsule Take 1 capsule (100 mg total) by mouth 2 (two) times daily. (Patient not taking: Reported on 05/05/2022) 10 capsule 0   ferrous sulfate 325 (65 FE) MG tablet Take 1 tablet by mouth daily.     senna-docusate (SENOKOT-S) 8.6-50 MG tablet Take 1  tablet by mouth at bedtime as needed for mild constipation. (Patient not taking: Reported on 05/05/2022)     traMADol (ULTRAM) 50 MG tablet Take 1 tablet (50 mg total) by mouth every 6 (six) hours as needed for moderate pain. (Patient not taking: Reported on 05/05/2022) 30 tablet 0   No facility-administered medications prior to visit.    Review of Systems;  Patient denies headache, fevers, malaise, unintentional weight loss, skin rash, eye pain, sinus congestion and sinus pain, sore throat, dysphagia,  hemoptysis , cough, dyspnea, wheezing, chest pain, palpitations, orthopnea, edema, abdominal pain, nausea, melena, diarrhea, constipation, flank pain, dysuria, hematuria, urinary  Frequency, nocturia, numbness, tingling, seizures,  Focal weakness, Loss of consciousness,  Tremor, insomnia, depression, anxiety, and  suicidal ideation.      Objective:  BP 110/78   Pulse 81   Temp (!) 97.5 F (36.4 C) (Oral)   Ht 5\' 10"  (1.778 m)   Wt 181 lb 1.9 oz (82.2 kg)   SpO2 96%   BMI 25.99 kg/m   BP Readings from Last 3 Encounters:  05/05/22 110/78  03/30/22 107/78  03/08/22 (!) 190/90    Wt Readings from Last 3 Encounters:  05/05/22 181 lb 1.9 oz (82.2 kg)  03/23/22 170 lb (77.1 kg)  03/08/22 170 lb (77.1 kg)    Physical Exam  Lab Results  Component Value Date   HGBA1C 6.4 03/08/2022    Lab Results  Component Value Date   CREATININE 1.33 (H) 03/28/2022   CREATININE 1.58 (H) 03/27/2022   CREATININE 1.90 (H) 03/26/2022    Lab Results  Component Value Date   WBC 8.0 03/28/2022   HGB 10.3 (L) 03/28/2022   HCT 32.1 (L) 03/28/2022   PLT 121 (L) 03/28/2022   GLUCOSE 127 (H) 03/28/2022   CHOL 107 03/08/2022   TRIG 85.0 03/08/2022   HDL 53.30 03/08/2022   LDLDIRECT 32.0 03/08/2022   LDLCALC 36 03/08/2022   ALT 16 03/23/2022   AST 25 03/23/2022   NA 142 03/28/2022   K 4.7 03/28/2022   CL 109 03/28/2022   CREATININE 1.33 (H) 03/28/2022   BUN 41 (H) 03/28/2022   CO2 27 03/28/2022   TSH 2.68 03/08/2022   PSA 13.67 (H) 05/15/2014   INR 1.3 (H) 03/25/2022   HGBA1C 6.4 03/08/2022   MICROALBUR 3.8 (H) 03/08/2022    ECHOCARDIOGRAM COMPLETE  Result Date: 03/26/2022    ECHOCARDIOGRAM REPORT   Patient Name:   Eric Hicks Date of Exam: 03/26/2022 Medical Rec #:  [id number]    Height:       70.0 in Accession #:    [id number]   Weight:       170.0 lb Date of Birth:  05-24-1926    BSA:          1.948 m Patient Age:    32 years     BP:           126/75 mmHg Patient Gender: M            HR:           84 bpm. Exam Location:  [facility] Procedure: 2D Echo, Cardiac Doppler and Color Doppler Indications:     Abnormal ECG R94.31  History:         Patient has prior history of Echocardiogram examinations, most                  recent 06/29/2018. COPD; Risk Factors:Hypertension.  Sonographer:     Sherrie Sport  Referring Phys:  [id number] [facility] AMIN Diagnosing Phys: Yolonda Kida MD  Sonographer Comments: Image quality was good. IMPRESSIONS  1. Left ventricular ejection fraction, by estimation, is 55 to 60%. The left ventricle has normal function. The left ventricle has no regional wall motion abnormalities. There is mild concentric left ventricular hypertrophy. Left ventricular diastolic parameters are consistent with Grade I diastolic dysfunction (impaired relaxation).  2. Right ventricular systolic function is normal. The right ventricular size is normal.  3. Left atrial size was mildly dilated.  4. Right atrial size was mildly dilated.  5. The mitral valve is grossly normal. Mild mitral valve regurgitation.  6. The aortic valve is calcified. Aortic valve regurgitation is mild to moderate. FINDINGS  Left Ventricle: Left ventricular ejection fraction, by estimation, is 55 to 60%. The left ventricle has normal function. The left ventricle has no regional wall motion abnormalities. The left ventricular internal cavity size was normal in size. There is  mild concentric left ventricular hypertrophy. Left ventricular diastolic parameters are consistent with Grade I diastolic dysfunction (impaired relaxation). Right Ventricle: The right ventricular size is normal. No increase in right ventricular wall thickness. Right ventricular systolic function is normal. Left Atrium: Left atrial size was mildly dilated. Right Atrium: Right atrial size was mildly dilated. Pericardium: There is no evidence of pericardial effusion. Mitral Valve: The mitral valve is grossly normal. There is mild thickening of the mitral valve leaflet(s). There is mild calcification of the mitral valve leaflet(s). Mildly decreased mobility of the mitral valve leaflets. Mild mitral annular calcification. Mild mitral valve regurgitation. Tricuspid Valve: The tricuspid valve is normal in structure. Tricuspid valve regurgitation is trivial. Aortic Valve: The  aortic valve is calcified. Aortic valve regurgitation is mild to moderate. Aortic regurgitation PHT measures 396 msec. Aortic valve mean gradient measures 3.0 mmHg. Aortic valve peak gradient measures 5.5 mmHg. Aortic valve area, by VTI  measures 3.22 cm. Pulmonic Valve: The pulmonic valve was normal in structure. Pulmonic valve regurgitation is not visualized. Aorta: The ascending aorta was not well visualized. IAS/Shunts: No atrial level shunt detected by color flow Doppler.  LEFT VENTRICLE PLAX 2D LVOT diam:     2.20 cm LV SV:         55 LV SV Index:   28 LVOT Area:     3.80 cm  RIGHT VENTRICLE RV Basal diam:  4.60 cm RV Mid diam:    3.90 cm RV S prime:     11.40 cm/s TAPSE (M-mode): 1.2 cm LEFT ATRIUM             Index        RIGHT ATRIUM           Index LA diam:        4.10 cm 2.10 cm/m   RA Area:     28.00 cm LA Vol (A2C):   64.8 ml 33.26 ml/m  RA Volume:   83.20 ml  42.71 ml/m LA Vol (A4C):   48.5 ml 24.90 ml/m LA Biplane Vol: 57.0 ml 29.26 ml/m  AORTIC VALVE AV Area (Vmax):    2.98 cm AV Area (Vmean):   2.85 cm AV Area (VTI):     3.22 cm AV Vmax:           117.00 cm/s AV Vmean:          81.500 cm/s AV VTI:            0.171 m AV Peak Grad:      5.5  mmHg AV Mean Grad:      3.0 mmHg LVOT Vmax:         91.80 cm/s LVOT Vmean:        61.000 cm/s LVOT VTI:          0.145 m LVOT/AV VTI ratio: 0.85 AI PHT:            396 msec  AORTA Ao Root diam: 3.90 cm MITRAL VALVE               TRICUSPID VALVE MV Area (PHT): 4.99 cm    TR Peak grad:   28.1 mmHg MV Decel Time: 152 msec    TR Vmax:        265.00 cm/s MV E velocity: 81.80 cm/s                            SHUNTS                            Systemic VTI:  0.14 m                            Systemic Diam: 2.20 cm Yolonda Kida MD Electronically signed by Yolonda Kida MD Signature Date/Time: 03/26/2022/1:43:34 PM    Final    US RENAL  Result Date: 03/26/2022 CLINICAL DATA:  Acute kidney injury EXAM: RENAL / URINARY TRACT ULTRASOUND COMPLETE  COMPARISON:  None available FINDINGS: Right Kidney: Renal measurements: 9.3 x 5.1 x 4.6 cm = volume: 116 mL. Mild diffuse cortical thinning. No mass or hydronephrosis visualized. 9 mm nonobstructing calculus present in the upper pole. Left Kidney: Renal measurements: 10.7 x 5.8 x 3.9 cm = volume: 127 mL. Mild diffuse cortical thinning. No mass or hydronephrosis visualized. 1.6 cm simple cyst in the upper pole does not require further imaging follow-up. Bladder: Unable to evaluate due to collapse configuration. Other: None. IMPRESSION: 1. No hydronephrosis. 2. Mild diffuse cortical thinning of the kidneys consistent with chronic renal failure. 3. 9 mm nonobstructing calculus in the upper pole of the right kidney. Electronically Signed   By: Miachel Roux M.D.   On: 03/26/2022 11:31   NM Pulmonary Perfusion  Result Date: 03/26/2022 CLINICAL DATA:  Concern for pulmonary embolism. Respiratory distress. Worsening respiratory distress. Rapid breathing EXAM: NUCLEAR MEDICINE PERFUSION LUNG SCAN TECHNIQUE: Perfusion images were obtained in multiple projections after intravenous injection of radiopharmaceutical. RADIOPHARMACEUTICALS:  4.5 mCi Tc-58m MAA COMPARISON:  Chest radiograph 03/25/2022, CT 01/12/2022 insert FINDINGS: Wedge-shaped peripheral perfusion defect within LEFT upper lobe is not matched on comparison chest radiograph. Small perfusion defect within the lateral aspect of the RIGHT upper lobe is also unmatched. More central RIGHT upper lobe perfusion defect corresponds to mass on comparison radiograph. regional decreased perfusion to the LEFT lower lobe. IMPRESSION: Unmatched wedge-shaped peripheral perfusion defects in the upper lobes are most consistent acute pulmonary emboli. These results will be called to the ordering clinician or representative by the Radiologist Assistant, and communication documented in the PACS or [employer]. Electronically Signed   By: Suzy Bouchard M.D.   On: 03/26/2022  11:26    Assessment & Plan:  .There are no diagnoses linked to this encounter.   I provided 30 minutes of face-to-face time during this encounter reviewing patient's last visit with me, patient's  most recent visit with cardiology,  nephrology,  and neurology,  recent surgical and non surgical procedures, previous  labs and imaging studies, counseling on currently addressed issues,  and post visit ordering to diagnostics and therapeutics .   Follow-up: No follow-ups on file.   Crecencio Mc, MD

## 2022-05-06 ENCOUNTER — Encounter: Payer: Self-pay | Admitting: Internal Medicine

## 2022-05-06 DIAGNOSIS — Z86711 Personal history of pulmonary embolism: Secondary | ICD-10-CM | POA: Diagnosis not present

## 2022-05-06 DIAGNOSIS — Z96641 Presence of right artificial hip joint: Secondary | ICD-10-CM | POA: Diagnosis not present

## 2022-05-06 DIAGNOSIS — I130 Hypertensive heart and chronic kidney disease with heart failure and stage 1 through stage 4 chronic kidney disease, or unspecified chronic kidney disease: Secondary | ICD-10-CM | POA: Diagnosis not present

## 2022-05-06 DIAGNOSIS — I5032 Chronic diastolic (congestive) heart failure: Secondary | ICD-10-CM | POA: Diagnosis not present

## 2022-05-06 DIAGNOSIS — R609 Edema, unspecified: Secondary | ICD-10-CM | POA: Insufficient documentation

## 2022-05-06 DIAGNOSIS — Z7951 Long term (current) use of inhaled steroids: Secondary | ICD-10-CM | POA: Diagnosis not present

## 2022-05-06 DIAGNOSIS — D631 Anemia in chronic kidney disease: Secondary | ICD-10-CM | POA: Diagnosis not present

## 2022-05-06 DIAGNOSIS — I420 Dilated cardiomyopathy: Secondary | ICD-10-CM | POA: Diagnosis not present

## 2022-05-06 DIAGNOSIS — J449 Chronic obstructive pulmonary disease, unspecified: Secondary | ICD-10-CM | POA: Diagnosis not present

## 2022-05-06 DIAGNOSIS — Z9181 History of falling: Secondary | ICD-10-CM | POA: Diagnosis not present

## 2022-05-06 DIAGNOSIS — N132 Hydronephrosis with renal and ureteral calculous obstruction: Secondary | ICD-10-CM | POA: Diagnosis not present

## 2022-05-06 DIAGNOSIS — K219 Gastro-esophageal reflux disease without esophagitis: Secondary | ICD-10-CM | POA: Diagnosis not present

## 2022-05-06 DIAGNOSIS — M810 Age-related osteoporosis without current pathological fracture: Secondary | ICD-10-CM | POA: Diagnosis not present

## 2022-05-06 DIAGNOSIS — H409 Unspecified glaucoma: Secondary | ICD-10-CM | POA: Diagnosis not present

## 2022-05-06 DIAGNOSIS — N1831 Chronic kidney disease, stage 3a: Secondary | ICD-10-CM | POA: Diagnosis not present

## 2022-05-06 DIAGNOSIS — E785 Hyperlipidemia, unspecified: Secondary | ICD-10-CM | POA: Diagnosis not present

## 2022-05-06 DIAGNOSIS — S72001D Fracture of unspecified part of neck of right femur, subsequent encounter for closed fracture with routine healing: Secondary | ICD-10-CM | POA: Diagnosis not present

## 2022-05-06 DIAGNOSIS — Z8719 Personal history of other diseases of the digestive system: Secondary | ICD-10-CM | POA: Diagnosis not present

## 2022-05-06 DIAGNOSIS — Z87891 Personal history of nicotine dependence: Secondary | ICD-10-CM | POA: Diagnosis not present

## 2022-05-06 DIAGNOSIS — I480 Paroxysmal atrial fibrillation: Secondary | ICD-10-CM | POA: Diagnosis not present

## 2022-05-06 DIAGNOSIS — Z87440 Personal history of urinary (tract) infections: Secondary | ICD-10-CM | POA: Diagnosis not present

## 2022-05-06 DIAGNOSIS — M47892 Other spondylosis, cervical region: Secondary | ICD-10-CM | POA: Diagnosis not present

## 2022-05-06 MED ORDER — METOLAZONE 2.5 MG PO TABS: 2.5000 mg | ORAL_TABLET | Freq: Every day | ORAL | 1 refills | Status: DC

## 2022-05-06 NOTE — Progress Notes (Incomplete)
Subjective:  Patient ID: Eric Hicks, male    DOB: 08-26-1926  Age: 89 y.o. MRN: [id number]  CC: There were no encounter diagnoses.   HPI Eric Hicks presents for hospital follow up Chief Complaint  Patient presents with  . Hospitalization Follow-up    Patient was admitted to [facility] on Nov 28  with a closed right hip fracture following a mechanical fall in a parking lot >  He underwent arthroplasty on 1) Acute respiratory failure with hypoxia ([place]), 2)  Acute pulmonary embolism ([facility]) and 3)   Closed right hip fracture ([place]). Following a fall in the parking lot of a [employer] . Taken to [facility] and underwent surgery and discharged to skilled nursing on Dec 5 skilled nursing for rehab  Readmitted to [facility] on Dec 13 with hematemsis and shock.  Transfused one unit, required levophed,  sepsis/bacteremia treated with IV zosyn  repeat cultures negative.  Discharged to skilled nursin on Dec 21  came home on Jan 65  Gained 10 lbs during hospitalization (fluid)  Appetite good  .  sleeping well.   Weight pre fall was 160's (170  by our scales I Sept) Edema despite resuming lasix  40 mg daily  for the past 4 days has not lost any weight and legs are v ery edematous left leg is weeping .    Not taking potassium    Ferrous sulfate   [facility] home health and PT set up   Sitting in recline most of time.  Abdomen has grown as well )abd wall edema)     Outpatient Medications Prior to Visit  Medication Sig Dispense Refill  . ADVAIR DISKUS 100-50 MCG/ACT AEPB INHALE 1 PUFF INTO THE LUNGS TWICE A DAY 180 each 1  . Aflibercept (EYLEA IO) Inject into the eye.    Marland Kitchen AMBULATORY NON FORMULARY MEDICATION Joint Advantage Gold 2 tablets daily    . atorvastatin (LIPITOR) 20 MG tablet TAKE 1 TABLET BY MOUTH EVERY OTHER DAY 45 tablet 3  . calcium carbonate (OS-CAL) 600 MG TABS Take 600 mg by mouth daily with breakfast.     . carvedilol (COREG) 6.25 MG tablet Take 6.25 mg by mouth 2 (two) times  daily.    . Cyanocobalamin (B-12 IJ) Inject 1,000 mg as directed every 30 (thirty) days.    . dorzolamide-timolol (COSOPT) 22.3-6.8 MG/ML ophthalmic solution TAKE 1 DROP(S) IN RIGHT EYE 2 TIMES A DAY  5  . doxazosin (CARDURA) 1 MG tablet TAKE 1 TABLET BY MOUTH EVERY DAY 90 tablet 1  . Fe Fum-Vit C-Vit B12-FA (TRIGELS-F FORTE) CAPS capsule Take 1 capsule by mouth daily after breakfast.  0  . furosemide (LASIX) 20 MG tablet TAKE 2 TABLETS BY MOUTH DAILY AS NEEDED (Patient taking differently: Take 40 mg by mouth daily.) 180 tablet 1  . latanoprost (XALATAN) 0.005 % ophthalmic solution Place 1 drop into both eyes at bedtime.  5  . leuprolide, 6 Month, (ELIGARD) 45 MG injection Inject 45 mg into the skin every 6 (six) months.    . Vitamin D, Cholecalciferol, 25 MCG (1000 UT) CAPS Take 1 capsule by mouth daily.    Marland Kitchen apixaban (ELIQUIS) 5 MG TABS tablet Take 1 tablet (5 mg total) by mouth 2 (two) times daily. (Patient not taking: Reported on 05/05/2022) 60 tablet   . docusate sodium (COLACE) 100 MG capsule Take 1 capsule (100 mg total) by mouth 2 (two) times daily. (Patient not taking: Reported on 05/05/2022) 10 capsule 0  .  ferrous sulfate 325 (65 FE) MG tablet Take 1 tablet by mouth daily.    Marland Kitchen senna-docusate (SENOKOT-S) 8.6-50 MG tablet Take 1 tablet by mouth at bedtime as needed for mild constipation. (Patient not taking: Reported on 05/05/2022)    . traMADol (ULTRAM) 50 MG tablet Take 1 tablet (50 mg total) by mouth every 6 (six) hours as needed for moderate pain. (Patient not taking: Reported on 05/05/2022) 30 tablet 0   No facility-administered medications prior to visit.    Review of Systems;  Patient denies headache, fevers, malaise, unintentional weight loss, skin rash, eye pain, sinus congestion and sinus pain, sore throat, dysphagia,  hemoptysis , cough, dyspnea, wheezing, chest pain, palpitations, orthopnea, edema, abdominal pain, nausea, melena, diarrhea, constipation, flank pain, dysuria,  hematuria, urinary  Frequency, nocturia, numbness, tingling, seizures,  Focal weakness, Loss of consciousness,  Tremor, insomnia, depression, anxiety, and suicidal ideation.      Objective:  BP 110/78   Pulse 81   Temp (!) 97.5 F (36.4 C) (Oral)   Ht 5\' 10"  (1.778 m)   Wt 181 lb 1.9 oz (82.2 kg)   SpO2 96%   BMI 25.99 kg/m   BP Readings from Last 3 Encounters:  05/05/22 110/78  03/30/22 107/78  03/08/22 (!) 190/90    Wt Readings from Last 3 Encounters:  05/05/22 181 lb 1.9 oz (82.2 kg)  03/23/22 170 lb (77.1 kg)  03/08/22 170 lb (77.1 kg)    Physical Exam  Lab Results  Component Value Date   HGBA1C 6.4 03/08/2022    Lab Results  Component Value Date   CREATININE 1.33 (H) 03/28/2022   CREATININE 1.58 (H) 03/27/2022   CREATININE 1.90 (H) 03/26/2022    Lab Results  Component Value Date   WBC 8.0 03/28/2022   HGB 10.3 (L) 03/28/2022   HCT 32.1 (L) 03/28/2022   PLT 121 (L) 03/28/2022   GLUCOSE 127 (H) 03/28/2022   CHOL 107 03/08/2022   TRIG 85.0 03/08/2022   HDL 53.30 03/08/2022   LDLDIRECT 32.0 03/08/2022   LDLCALC 36 03/08/2022   ALT 16 03/23/2022   AST 25 03/23/2022   NA 142 03/28/2022   K 4.7 03/28/2022   CL 109 03/28/2022   CREATININE 1.33 (H) 03/28/2022   BUN 41 (H) 03/28/2022   CO2 27 03/28/2022   TSH 2.68 03/08/2022   PSA 13.67 (H) 05/15/2014   INR 1.3 (H) 03/25/2022   HGBA1C 6.4 03/08/2022   MICROALBUR 3.8 (H) 03/08/2022    ECHOCARDIOGRAM COMPLETE  Result Date: 03/26/2022    ECHOCARDIOGRAM REPORT   Patient Name:   Eric Hicks Date of Exam: 03/26/2022 Medical Rec #:  [id number]    Height:       70.0 in Accession #:    [id number]   Weight:       170.0 lb Date of Birth:  06-18-1926    BSA:          1.948 m Patient Age:    53 years     BP:           126/75 mmHg Patient Gender: M            HR:           84 bpm. Exam Location:  [facility] Procedure: 2D Echo, Cardiac Doppler and Color Doppler Indications:     Abnormal ECG R94.31  History:         Patient  has prior history of Echocardiogram examinations, most  recent 06/29/2018. COPD; Risk Factors:Hypertension.  Sonographer:     Sherrie Sport Referring Phys:  [id number] [facility] AMIN Diagnosing Phys: Yolonda Kida MD  Sonographer Comments: Image quality was good. IMPRESSIONS  1. Left ventricular ejection fraction, by estimation, is 55 to 60%. The left ventricle has normal function. The left ventricle has no regional wall motion abnormalities. There is mild concentric left ventricular hypertrophy. Left ventricular diastolic parameters are consistent with Grade I diastolic dysfunction (impaired relaxation).  2. Right ventricular systolic function is normal. The right ventricular size is normal.  3. Left atrial size was mildly dilated.  4. Right atrial size was mildly dilated.  5. The mitral valve is grossly normal. Mild mitral valve regurgitation.  6. The aortic valve is calcified. Aortic valve regurgitation is mild to moderate. FINDINGS  Left Ventricle: Left ventricular ejection fraction, by estimation, is 55 to 60%. The left ventricle has normal function. The left ventricle has no regional wall motion abnormalities. The left ventricular internal cavity size was normal in size. There is  mild concentric left ventricular hypertrophy. Left ventricular diastolic parameters are consistent with Grade I diastolic dysfunction (impaired relaxation). Right Ventricle: The right ventricular size is normal. No increase in right ventricular wall thickness. Right ventricular systolic function is normal. Left Atrium: Left atrial size was mildly dilated. Right Atrium: Right atrial size was mildly dilated. Pericardium: There is no evidence of pericardial effusion. Mitral Valve: The mitral valve is grossly normal. There is mild thickening of the mitral valve leaflet(s). There is mild calcification of the mitral valve leaflet(s). Mildly decreased mobility of the mitral valve leaflets. Mild mitral annular calcification. Mild  mitral valve regurgitation. Tricuspid Valve: The tricuspid valve is normal in structure. Tricuspid valve regurgitation is trivial. Aortic Valve: The aortic valve is calcified. Aortic valve regurgitation is mild to moderate. Aortic regurgitation PHT measures 396 msec. Aortic valve mean gradient measures 3.0 mmHg. Aortic valve peak gradient measures 5.5 mmHg. Aortic valve area, by VTI  measures 3.22 cm. Pulmonic Valve: The pulmonic valve was normal in structure. Pulmonic valve regurgitation is not visualized. Aorta: The ascending aorta was not well visualized. IAS/Shunts: No atrial level shunt detected by color flow Doppler.  LEFT VENTRICLE PLAX 2D LVOT diam:     2.20 cm LV SV:         55 LV SV Index:   28 LVOT Area:     3.80 cm  RIGHT VENTRICLE RV Basal diam:  4.60 cm RV Mid diam:    3.90 cm RV S prime:     11.40 cm/s TAPSE (M-mode): 1.2 cm LEFT ATRIUM             Index        RIGHT ATRIUM           Index LA diam:        4.10 cm 2.10 cm/m   RA Area:     28.00 cm LA Vol (A2C):   64.8 ml 33.26 ml/m  RA Volume:   83.20 ml  42.71 ml/m LA Vol (A4C):   48.5 ml 24.90 ml/m LA Biplane Vol: 57.0 ml 29.26 ml/m  AORTIC VALVE AV Area (Vmax):    2.98 cm AV Area (Vmean):   2.85 cm AV Area (VTI):     3.22 cm AV Vmax:           117.00 cm/s AV Vmean:          81.500 cm/s AV VTI:  0.171 m AV Peak Grad:      5.5 mmHg AV Mean Grad:      3.0 mmHg LVOT Vmax:         91.80 cm/s LVOT Vmean:        61.000 cm/s LVOT VTI:          0.145 m LVOT/AV VTI ratio: 0.85 AI PHT:            396 msec  AORTA Ao Root diam: 3.90 cm MITRAL VALVE               TRICUSPID VALVE MV Area (PHT): 4.99 cm    TR Peak grad:   28.1 mmHg MV Decel Time: 152 msec    TR Vmax:        265.00 cm/s MV E velocity: 81.80 cm/s                            SHUNTS                            Systemic VTI:  0.14 m                            Systemic Diam: 2.20 cm Yolonda Kida MD Electronically signed by Yolonda Kida MD Signature Date/Time:  03/26/2022/1:43:34 PM    Final    US RENAL  Result Date: 03/26/2022 CLINICAL DATA:  Acute kidney injury EXAM: RENAL / URINARY TRACT ULTRASOUND COMPLETE COMPARISON:  None available FINDINGS: Right Kidney: Renal measurements: 9.3 x 5.1 x 4.6 cm = volume: 116 mL. Mild diffuse cortical thinning. No mass or hydronephrosis visualized. 9 mm nonobstructing calculus present in the upper pole. Left Kidney: Renal measurements: 10.7 x 5.8 x 3.9 cm = volume: 127 mL. Mild diffuse cortical thinning. No mass or hydronephrosis visualized. 1.6 cm simple cyst in the upper pole does not require further imaging follow-up. Bladder: Unable to evaluate due to collapse configuration. Other: None. IMPRESSION: 1. No hydronephrosis. 2. Mild diffuse cortical thinning of the kidneys consistent with chronic renal failure. 3. 9 mm nonobstructing calculus in the upper pole of the right kidney. Electronically Signed   By: Miachel Roux M.D.   On: 03/26/2022 11:31   NM Pulmonary Perfusion  Result Date: 03/26/2022 CLINICAL DATA:  Concern for pulmonary embolism. Respiratory distress. Worsening respiratory distress. Rapid breathing EXAM: NUCLEAR MEDICINE PERFUSION LUNG SCAN TECHNIQUE: Perfusion images were obtained in multiple projections after intravenous injection of radiopharmaceutical. RADIOPHARMACEUTICALS:  4.5 mCi Tc-6m MAA COMPARISON:  Chest radiograph 03/25/2022, CT 01/12/2022 insert FINDINGS: Wedge-shaped peripheral perfusion defect within LEFT upper lobe is not matched on comparison chest radiograph. Small perfusion defect within the lateral aspect of the RIGHT upper lobe is also unmatched. More central RIGHT upper lobe perfusion defect corresponds to mass on comparison radiograph. regional decreased perfusion to the LEFT lower lobe. IMPRESSION: Unmatched wedge-shaped peripheral perfusion defects in the upper lobes are most consistent acute pulmonary emboli. These results will be called to the ordering clinician or representative by the  Radiologist Assistant, and communication documented in the PACS or [employer]. Electronically Signed   By: Suzy Bouchard M.D.   On: 03/26/2022 11:26    Assessment & Plan:  .There are no diagnoses linked to this encounter.   I provided 30 minutes of face-to-face time during this encounter reviewing patient's last visit with me, patient's  most recent visit with cardiology,  nephrology,  and neurology,  recent surgical and non surgical procedures, previous  labs and imaging studies, counseling on currently addressed issues,  and post visit ordering to diagnostics and therapeutics .   Follow-up: No follow-ups on file.   Crecencio Mc, MD

## 2022-05-06 NOTE — Assessment & Plan Note (Signed)
With acute on chronic renal failure secondary to hypotension and sepsis.  GFR has improved since last discharge, GFR is 38 ml/min.

## 2022-05-06 NOTE — Assessment & Plan Note (Signed)
Occurred in the setting of hip fracture and active lung cancer.  No longer anticoagulated due to GI bleed.

## 2022-05-06 NOTE — Assessment & Plan Note (Signed)
Secondary to aggressive fluid resuscitation and protein loss .  Adding oxymetazolone to lasix 40 mg daily

## 2022-05-07 DIAGNOSIS — E785 Hyperlipidemia, unspecified: Secondary | ICD-10-CM | POA: Diagnosis not present

## 2022-05-07 DIAGNOSIS — I480 Paroxysmal atrial fibrillation: Secondary | ICD-10-CM | POA: Diagnosis not present

## 2022-05-07 DIAGNOSIS — Z86711 Personal history of pulmonary embolism: Secondary | ICD-10-CM | POA: Diagnosis not present

## 2022-05-07 DIAGNOSIS — M810 Age-related osteoporosis without current pathological fracture: Secondary | ICD-10-CM | POA: Diagnosis not present

## 2022-05-07 DIAGNOSIS — S72001D Fracture of unspecified part of neck of right femur, subsequent encounter for closed fracture with routine healing: Secondary | ICD-10-CM | POA: Diagnosis not present

## 2022-05-07 DIAGNOSIS — Z87891 Personal history of nicotine dependence: Secondary | ICD-10-CM | POA: Diagnosis not present

## 2022-05-07 DIAGNOSIS — J449 Chronic obstructive pulmonary disease, unspecified: Secondary | ICD-10-CM | POA: Diagnosis not present

## 2022-05-07 DIAGNOSIS — N1831 Chronic kidney disease, stage 3a: Secondary | ICD-10-CM | POA: Diagnosis not present

## 2022-05-07 DIAGNOSIS — Z9181 History of falling: Secondary | ICD-10-CM | POA: Diagnosis not present

## 2022-05-07 DIAGNOSIS — I5032 Chronic diastolic (congestive) heart failure: Secondary | ICD-10-CM | POA: Diagnosis not present

## 2022-05-07 DIAGNOSIS — N132 Hydronephrosis with renal and ureteral calculous obstruction: Secondary | ICD-10-CM | POA: Diagnosis not present

## 2022-05-07 DIAGNOSIS — I420 Dilated cardiomyopathy: Secondary | ICD-10-CM | POA: Diagnosis not present

## 2022-05-07 DIAGNOSIS — Z7951 Long term (current) use of inhaled steroids: Secondary | ICD-10-CM | POA: Diagnosis not present

## 2022-05-07 DIAGNOSIS — Z87440 Personal history of urinary (tract) infections: Secondary | ICD-10-CM | POA: Diagnosis not present

## 2022-05-07 DIAGNOSIS — K219 Gastro-esophageal reflux disease without esophagitis: Secondary | ICD-10-CM | POA: Diagnosis not present

## 2022-05-07 DIAGNOSIS — M47892 Other spondylosis, cervical region: Secondary | ICD-10-CM | POA: Diagnosis not present

## 2022-05-07 DIAGNOSIS — Z96641 Presence of right artificial hip joint: Secondary | ICD-10-CM | POA: Diagnosis not present

## 2022-05-07 DIAGNOSIS — H409 Unspecified glaucoma: Secondary | ICD-10-CM | POA: Diagnosis not present

## 2022-05-07 DIAGNOSIS — D631 Anemia in chronic kidney disease: Secondary | ICD-10-CM | POA: Diagnosis not present

## 2022-05-07 DIAGNOSIS — Z8719 Personal history of other diseases of the digestive system: Secondary | ICD-10-CM | POA: Diagnosis not present

## 2022-05-07 DIAGNOSIS — I130 Hypertensive heart and chronic kidney disease with heart failure and stage 1 through stage 4 chronic kidney disease, or unspecified chronic kidney disease: Secondary | ICD-10-CM | POA: Diagnosis not present

## 2022-05-10 DIAGNOSIS — Z87891 Personal history of nicotine dependence: Secondary | ICD-10-CM

## 2022-05-10 DIAGNOSIS — I130 Hypertensive heart and chronic kidney disease with heart failure and stage 1 through stage 4 chronic kidney disease, or unspecified chronic kidney disease: Secondary | ICD-10-CM

## 2022-05-10 DIAGNOSIS — D631 Anemia in chronic kidney disease: Secondary | ICD-10-CM

## 2022-05-10 DIAGNOSIS — Z86711 Personal history of pulmonary embolism: Secondary | ICD-10-CM

## 2022-05-10 DIAGNOSIS — N1831 Chronic kidney disease, stage 3a: Secondary | ICD-10-CM

## 2022-05-10 DIAGNOSIS — I480 Paroxysmal atrial fibrillation: Secondary | ICD-10-CM

## 2022-05-10 DIAGNOSIS — K219 Gastro-esophageal reflux disease without esophagitis: Secondary | ICD-10-CM

## 2022-05-10 DIAGNOSIS — Z9181 History of falling: Secondary | ICD-10-CM

## 2022-05-10 DIAGNOSIS — N132 Hydronephrosis with renal and ureteral calculous obstruction: Secondary | ICD-10-CM

## 2022-05-10 DIAGNOSIS — Z8719 Personal history of other diseases of the digestive system: Secondary | ICD-10-CM

## 2022-05-10 DIAGNOSIS — Z7951 Long term (current) use of inhaled steroids: Secondary | ICD-10-CM

## 2022-05-10 DIAGNOSIS — H409 Unspecified glaucoma: Secondary | ICD-10-CM

## 2022-05-10 DIAGNOSIS — N39498 Other specified urinary incontinence: Secondary | ICD-10-CM

## 2022-05-10 DIAGNOSIS — N401 Enlarged prostate with lower urinary tract symptoms: Secondary | ICD-10-CM

## 2022-05-10 DIAGNOSIS — Z87440 Personal history of urinary (tract) infections: Secondary | ICD-10-CM

## 2022-05-10 DIAGNOSIS — Z8546 Personal history of malignant neoplasm of prostate: Secondary | ICD-10-CM

## 2022-05-10 DIAGNOSIS — I420 Dilated cardiomyopathy: Secondary | ICD-10-CM

## 2022-05-10 DIAGNOSIS — S72001D Fracture of unspecified part of neck of right femur, subsequent encounter for closed fracture with routine healing: Secondary | ICD-10-CM | POA: Diagnosis not present

## 2022-05-10 DIAGNOSIS — E785 Hyperlipidemia, unspecified: Secondary | ICD-10-CM

## 2022-05-10 DIAGNOSIS — Z96641 Presence of right artificial hip joint: Secondary | ICD-10-CM

## 2022-05-10 DIAGNOSIS — M810 Age-related osteoporosis without current pathological fracture: Secondary | ICD-10-CM

## 2022-05-10 DIAGNOSIS — M47892 Other spondylosis, cervical region: Secondary | ICD-10-CM

## 2022-05-10 DIAGNOSIS — J449 Chronic obstructive pulmonary disease, unspecified: Secondary | ICD-10-CM

## 2022-05-10 DIAGNOSIS — I5032 Chronic diastolic (congestive) heart failure: Secondary | ICD-10-CM

## 2022-05-11 DIAGNOSIS — M47892 Other spondylosis, cervical region: Secondary | ICD-10-CM | POA: Diagnosis not present

## 2022-05-11 DIAGNOSIS — M810 Age-related osteoporosis without current pathological fracture: Secondary | ICD-10-CM | POA: Diagnosis not present

## 2022-05-11 DIAGNOSIS — N1831 Chronic kidney disease, stage 3a: Secondary | ICD-10-CM | POA: Diagnosis not present

## 2022-05-11 DIAGNOSIS — E785 Hyperlipidemia, unspecified: Secondary | ICD-10-CM | POA: Diagnosis not present

## 2022-05-11 DIAGNOSIS — S72001D Fracture of unspecified part of neck of right femur, subsequent encounter for closed fracture with routine healing: Secondary | ICD-10-CM | POA: Diagnosis not present

## 2022-05-11 DIAGNOSIS — D631 Anemia in chronic kidney disease: Secondary | ICD-10-CM | POA: Diagnosis not present

## 2022-05-11 DIAGNOSIS — I130 Hypertensive heart and chronic kidney disease with heart failure and stage 1 through stage 4 chronic kidney disease, or unspecified chronic kidney disease: Secondary | ICD-10-CM | POA: Diagnosis not present

## 2022-05-11 DIAGNOSIS — N132 Hydronephrosis with renal and ureteral calculous obstruction: Secondary | ICD-10-CM | POA: Diagnosis not present

## 2022-05-11 DIAGNOSIS — I420 Dilated cardiomyopathy: Secondary | ICD-10-CM | POA: Diagnosis not present

## 2022-05-11 DIAGNOSIS — Z7951 Long term (current) use of inhaled steroids: Secondary | ICD-10-CM | POA: Diagnosis not present

## 2022-05-11 DIAGNOSIS — I5032 Chronic diastolic (congestive) heart failure: Secondary | ICD-10-CM | POA: Diagnosis not present

## 2022-05-11 DIAGNOSIS — K219 Gastro-esophageal reflux disease without esophagitis: Secondary | ICD-10-CM | POA: Diagnosis not present

## 2022-05-11 DIAGNOSIS — Z87891 Personal history of nicotine dependence: Secondary | ICD-10-CM | POA: Diagnosis not present

## 2022-05-11 DIAGNOSIS — J449 Chronic obstructive pulmonary disease, unspecified: Secondary | ICD-10-CM | POA: Diagnosis not present

## 2022-05-11 DIAGNOSIS — Z96641 Presence of right artificial hip joint: Secondary | ICD-10-CM | POA: Diagnosis not present

## 2022-05-11 DIAGNOSIS — Z8719 Personal history of other diseases of the digestive system: Secondary | ICD-10-CM | POA: Diagnosis not present

## 2022-05-11 DIAGNOSIS — Z87440 Personal history of urinary (tract) infections: Secondary | ICD-10-CM | POA: Diagnosis not present

## 2022-05-11 DIAGNOSIS — H409 Unspecified glaucoma: Secondary | ICD-10-CM | POA: Diagnosis not present

## 2022-05-11 DIAGNOSIS — Z86711 Personal history of pulmonary embolism: Secondary | ICD-10-CM | POA: Diagnosis not present

## 2022-05-11 DIAGNOSIS — Z9181 History of falling: Secondary | ICD-10-CM | POA: Diagnosis not present

## 2022-05-11 DIAGNOSIS — I480 Paroxysmal atrial fibrillation: Secondary | ICD-10-CM | POA: Diagnosis not present

## 2022-05-12 DIAGNOSIS — S72001D Fracture of unspecified part of neck of right femur, subsequent encounter for closed fracture with routine healing: Secondary | ICD-10-CM | POA: Diagnosis not present

## 2022-05-12 DIAGNOSIS — H409 Unspecified glaucoma: Secondary | ICD-10-CM | POA: Diagnosis not present

## 2022-05-12 DIAGNOSIS — Z96641 Presence of right artificial hip joint: Secondary | ICD-10-CM | POA: Diagnosis not present

## 2022-05-12 DIAGNOSIS — K219 Gastro-esophageal reflux disease without esophagitis: Secondary | ICD-10-CM | POA: Diagnosis not present

## 2022-05-12 DIAGNOSIS — J449 Chronic obstructive pulmonary disease, unspecified: Secondary | ICD-10-CM | POA: Diagnosis not present

## 2022-05-12 DIAGNOSIS — D631 Anemia in chronic kidney disease: Secondary | ICD-10-CM | POA: Diagnosis not present

## 2022-05-12 DIAGNOSIS — I130 Hypertensive heart and chronic kidney disease with heart failure and stage 1 through stage 4 chronic kidney disease, or unspecified chronic kidney disease: Secondary | ICD-10-CM | POA: Diagnosis not present

## 2022-05-12 DIAGNOSIS — Z9181 History of falling: Secondary | ICD-10-CM | POA: Diagnosis not present

## 2022-05-12 DIAGNOSIS — I5032 Chronic diastolic (congestive) heart failure: Secondary | ICD-10-CM | POA: Diagnosis not present

## 2022-05-12 DIAGNOSIS — I480 Paroxysmal atrial fibrillation: Secondary | ICD-10-CM | POA: Diagnosis not present

## 2022-05-12 DIAGNOSIS — Z86711 Personal history of pulmonary embolism: Secondary | ICD-10-CM | POA: Diagnosis not present

## 2022-05-12 DIAGNOSIS — N1831 Chronic kidney disease, stage 3a: Secondary | ICD-10-CM | POA: Diagnosis not present

## 2022-05-12 DIAGNOSIS — E785 Hyperlipidemia, unspecified: Secondary | ICD-10-CM | POA: Diagnosis not present

## 2022-05-12 DIAGNOSIS — M810 Age-related osteoporosis without current pathological fracture: Secondary | ICD-10-CM | POA: Diagnosis not present

## 2022-05-12 DIAGNOSIS — M47892 Other spondylosis, cervical region: Secondary | ICD-10-CM | POA: Diagnosis not present

## 2022-05-12 DIAGNOSIS — Z87440 Personal history of urinary (tract) infections: Secondary | ICD-10-CM | POA: Diagnosis not present

## 2022-05-12 DIAGNOSIS — Z7951 Long term (current) use of inhaled steroids: Secondary | ICD-10-CM | POA: Diagnosis not present

## 2022-05-12 DIAGNOSIS — Z87891 Personal history of nicotine dependence: Secondary | ICD-10-CM | POA: Diagnosis not present

## 2022-05-12 DIAGNOSIS — N132 Hydronephrosis with renal and ureteral calculous obstruction: Secondary | ICD-10-CM | POA: Diagnosis not present

## 2022-05-12 DIAGNOSIS — Z8719 Personal history of other diseases of the digestive system: Secondary | ICD-10-CM | POA: Diagnosis not present

## 2022-05-12 DIAGNOSIS — I420 Dilated cardiomyopathy: Secondary | ICD-10-CM | POA: Diagnosis not present

## 2022-05-14 ENCOUNTER — Other Ambulatory Visit (INDEPENDENT_AMBULATORY_CARE_PROVIDER_SITE_OTHER): Payer: Medicare Other

## 2022-05-14 DIAGNOSIS — Z7951 Long term (current) use of inhaled steroids: Secondary | ICD-10-CM | POA: Diagnosis not present

## 2022-05-14 DIAGNOSIS — I480 Paroxysmal atrial fibrillation: Secondary | ICD-10-CM | POA: Diagnosis not present

## 2022-05-14 DIAGNOSIS — I420 Dilated cardiomyopathy: Secondary | ICD-10-CM | POA: Diagnosis not present

## 2022-05-14 DIAGNOSIS — N1831 Chronic kidney disease, stage 3a: Secondary | ICD-10-CM | POA: Diagnosis not present

## 2022-05-14 DIAGNOSIS — Z86711 Personal history of pulmonary embolism: Secondary | ICD-10-CM | POA: Diagnosis not present

## 2022-05-14 DIAGNOSIS — Z9181 History of falling: Secondary | ICD-10-CM | POA: Diagnosis not present

## 2022-05-14 DIAGNOSIS — R601 Generalized edema: Secondary | ICD-10-CM

## 2022-05-14 DIAGNOSIS — S72001D Fracture of unspecified part of neck of right femur, subsequent encounter for closed fracture with routine healing: Secondary | ICD-10-CM | POA: Diagnosis not present

## 2022-05-14 DIAGNOSIS — I5032 Chronic diastolic (congestive) heart failure: Secondary | ICD-10-CM | POA: Diagnosis not present

## 2022-05-14 DIAGNOSIS — H409 Unspecified glaucoma: Secondary | ICD-10-CM | POA: Diagnosis not present

## 2022-05-14 DIAGNOSIS — N132 Hydronephrosis with renal and ureteral calculous obstruction: Secondary | ICD-10-CM | POA: Diagnosis not present

## 2022-05-14 DIAGNOSIS — E785 Hyperlipidemia, unspecified: Secondary | ICD-10-CM | POA: Diagnosis not present

## 2022-05-14 DIAGNOSIS — M810 Age-related osteoporosis without current pathological fracture: Secondary | ICD-10-CM | POA: Diagnosis not present

## 2022-05-14 DIAGNOSIS — Z87891 Personal history of nicotine dependence: Secondary | ICD-10-CM | POA: Diagnosis not present

## 2022-05-14 DIAGNOSIS — M47892 Other spondylosis, cervical region: Secondary | ICD-10-CM | POA: Diagnosis not present

## 2022-05-14 DIAGNOSIS — Z96641 Presence of right artificial hip joint: Secondary | ICD-10-CM | POA: Diagnosis not present

## 2022-05-14 DIAGNOSIS — D631 Anemia in chronic kidney disease: Secondary | ICD-10-CM | POA: Diagnosis not present

## 2022-05-14 DIAGNOSIS — K219 Gastro-esophageal reflux disease without esophagitis: Secondary | ICD-10-CM | POA: Diagnosis not present

## 2022-05-14 DIAGNOSIS — J449 Chronic obstructive pulmonary disease, unspecified: Secondary | ICD-10-CM | POA: Diagnosis not present

## 2022-05-14 DIAGNOSIS — Z87440 Personal history of urinary (tract) infections: Secondary | ICD-10-CM | POA: Diagnosis not present

## 2022-05-14 DIAGNOSIS — I130 Hypertensive heart and chronic kidney disease with heart failure and stage 1 through stage 4 chronic kidney disease, or unspecified chronic kidney disease: Secondary | ICD-10-CM | POA: Diagnosis not present

## 2022-05-14 DIAGNOSIS — Z8719 Personal history of other diseases of the digestive system: Secondary | ICD-10-CM | POA: Diagnosis not present

## 2022-05-15 LAB — PREALBUMIN: Prealbumin: 18 mg/dL — ABNORMAL LOW (ref 21–43)

## 2022-05-17 DIAGNOSIS — I420 Dilated cardiomyopathy: Secondary | ICD-10-CM | POA: Diagnosis not present

## 2022-05-17 DIAGNOSIS — D631 Anemia in chronic kidney disease: Secondary | ICD-10-CM | POA: Diagnosis not present

## 2022-05-17 DIAGNOSIS — N132 Hydronephrosis with renal and ureteral calculous obstruction: Secondary | ICD-10-CM | POA: Diagnosis not present

## 2022-05-17 DIAGNOSIS — Z96641 Presence of right artificial hip joint: Secondary | ICD-10-CM | POA: Diagnosis not present

## 2022-05-17 DIAGNOSIS — J449 Chronic obstructive pulmonary disease, unspecified: Secondary | ICD-10-CM | POA: Diagnosis not present

## 2022-05-17 DIAGNOSIS — Z87891 Personal history of nicotine dependence: Secondary | ICD-10-CM | POA: Diagnosis not present

## 2022-05-17 DIAGNOSIS — K219 Gastro-esophageal reflux disease without esophagitis: Secondary | ICD-10-CM | POA: Diagnosis not present

## 2022-05-17 DIAGNOSIS — Z9181 History of falling: Secondary | ICD-10-CM | POA: Diagnosis not present

## 2022-05-17 DIAGNOSIS — Z87440 Personal history of urinary (tract) infections: Secondary | ICD-10-CM | POA: Diagnosis not present

## 2022-05-17 DIAGNOSIS — Z8719 Personal history of other diseases of the digestive system: Secondary | ICD-10-CM | POA: Diagnosis not present

## 2022-05-17 DIAGNOSIS — I480 Paroxysmal atrial fibrillation: Secondary | ICD-10-CM | POA: Diagnosis not present

## 2022-05-17 DIAGNOSIS — H409 Unspecified glaucoma: Secondary | ICD-10-CM | POA: Diagnosis not present

## 2022-05-17 DIAGNOSIS — S72001D Fracture of unspecified part of neck of right femur, subsequent encounter for closed fracture with routine healing: Secondary | ICD-10-CM | POA: Diagnosis not present

## 2022-05-17 DIAGNOSIS — N1831 Chronic kidney disease, stage 3a: Secondary | ICD-10-CM | POA: Diagnosis not present

## 2022-05-17 DIAGNOSIS — I130 Hypertensive heart and chronic kidney disease with heart failure and stage 1 through stage 4 chronic kidney disease, or unspecified chronic kidney disease: Secondary | ICD-10-CM | POA: Diagnosis not present

## 2022-05-17 DIAGNOSIS — Z86711 Personal history of pulmonary embolism: Secondary | ICD-10-CM | POA: Diagnosis not present

## 2022-05-17 DIAGNOSIS — I5032 Chronic diastolic (congestive) heart failure: Secondary | ICD-10-CM | POA: Diagnosis not present

## 2022-05-17 DIAGNOSIS — Z7951 Long term (current) use of inhaled steroids: Secondary | ICD-10-CM | POA: Diagnosis not present

## 2022-05-17 DIAGNOSIS — E785 Hyperlipidemia, unspecified: Secondary | ICD-10-CM | POA: Diagnosis not present

## 2022-05-17 DIAGNOSIS — M47892 Other spondylosis, cervical region: Secondary | ICD-10-CM | POA: Diagnosis not present

## 2022-05-17 DIAGNOSIS — M810 Age-related osteoporosis without current pathological fracture: Secondary | ICD-10-CM | POA: Diagnosis not present

## 2022-05-18 DIAGNOSIS — Z9181 History of falling: Secondary | ICD-10-CM | POA: Diagnosis not present

## 2022-05-18 DIAGNOSIS — D631 Anemia in chronic kidney disease: Secondary | ICD-10-CM | POA: Diagnosis not present

## 2022-05-18 DIAGNOSIS — I5032 Chronic diastolic (congestive) heart failure: Secondary | ICD-10-CM | POA: Diagnosis not present

## 2022-05-18 DIAGNOSIS — H409 Unspecified glaucoma: Secondary | ICD-10-CM | POA: Diagnosis not present

## 2022-05-18 DIAGNOSIS — Z86711 Personal history of pulmonary embolism: Secondary | ICD-10-CM | POA: Diagnosis not present

## 2022-05-18 DIAGNOSIS — I480 Paroxysmal atrial fibrillation: Secondary | ICD-10-CM | POA: Diagnosis not present

## 2022-05-18 DIAGNOSIS — Z87891 Personal history of nicotine dependence: Secondary | ICD-10-CM | POA: Diagnosis not present

## 2022-05-18 DIAGNOSIS — N1831 Chronic kidney disease, stage 3a: Secondary | ICD-10-CM | POA: Diagnosis not present

## 2022-05-18 DIAGNOSIS — E785 Hyperlipidemia, unspecified: Secondary | ICD-10-CM | POA: Diagnosis not present

## 2022-05-18 DIAGNOSIS — I420 Dilated cardiomyopathy: Secondary | ICD-10-CM | POA: Diagnosis not present

## 2022-05-18 DIAGNOSIS — N132 Hydronephrosis with renal and ureteral calculous obstruction: Secondary | ICD-10-CM | POA: Diagnosis not present

## 2022-05-18 DIAGNOSIS — K219 Gastro-esophageal reflux disease without esophagitis: Secondary | ICD-10-CM | POA: Diagnosis not present

## 2022-05-18 DIAGNOSIS — Z8719 Personal history of other diseases of the digestive system: Secondary | ICD-10-CM | POA: Diagnosis not present

## 2022-05-18 DIAGNOSIS — Z96641 Presence of right artificial hip joint: Secondary | ICD-10-CM | POA: Diagnosis not present

## 2022-05-18 DIAGNOSIS — Z7951 Long term (current) use of inhaled steroids: Secondary | ICD-10-CM | POA: Diagnosis not present

## 2022-05-18 DIAGNOSIS — I130 Hypertensive heart and chronic kidney disease with heart failure and stage 1 through stage 4 chronic kidney disease, or unspecified chronic kidney disease: Secondary | ICD-10-CM | POA: Diagnosis not present

## 2022-05-18 DIAGNOSIS — M47892 Other spondylosis, cervical region: Secondary | ICD-10-CM | POA: Diagnosis not present

## 2022-05-18 DIAGNOSIS — S72001D Fracture of unspecified part of neck of right femur, subsequent encounter for closed fracture with routine healing: Secondary | ICD-10-CM | POA: Diagnosis not present

## 2022-05-18 DIAGNOSIS — M810 Age-related osteoporosis without current pathological fracture: Secondary | ICD-10-CM | POA: Diagnosis not present

## 2022-05-18 DIAGNOSIS — Z87440 Personal history of urinary (tract) infections: Secondary | ICD-10-CM | POA: Diagnosis not present

## 2022-05-18 DIAGNOSIS — J449 Chronic obstructive pulmonary disease, unspecified: Secondary | ICD-10-CM | POA: Diagnosis not present

## 2022-05-20 DIAGNOSIS — I5032 Chronic diastolic (congestive) heart failure: Secondary | ICD-10-CM | POA: Diagnosis not present

## 2022-05-20 DIAGNOSIS — Z9181 History of falling: Secondary | ICD-10-CM | POA: Diagnosis not present

## 2022-05-20 DIAGNOSIS — I130 Hypertensive heart and chronic kidney disease with heart failure and stage 1 through stage 4 chronic kidney disease, or unspecified chronic kidney disease: Secondary | ICD-10-CM | POA: Diagnosis not present

## 2022-05-20 DIAGNOSIS — J449 Chronic obstructive pulmonary disease, unspecified: Secondary | ICD-10-CM | POA: Diagnosis not present

## 2022-05-20 DIAGNOSIS — N1831 Chronic kidney disease, stage 3a: Secondary | ICD-10-CM | POA: Diagnosis not present

## 2022-05-20 DIAGNOSIS — Z86711 Personal history of pulmonary embolism: Secondary | ICD-10-CM | POA: Diagnosis not present

## 2022-05-20 DIAGNOSIS — I420 Dilated cardiomyopathy: Secondary | ICD-10-CM | POA: Diagnosis not present

## 2022-05-20 DIAGNOSIS — M47892 Other spondylosis, cervical region: Secondary | ICD-10-CM | POA: Diagnosis not present

## 2022-05-20 DIAGNOSIS — H409 Unspecified glaucoma: Secondary | ICD-10-CM | POA: Diagnosis not present

## 2022-05-20 DIAGNOSIS — S72001D Fracture of unspecified part of neck of right femur, subsequent encounter for closed fracture with routine healing: Secondary | ICD-10-CM | POA: Diagnosis not present

## 2022-05-20 DIAGNOSIS — M810 Age-related osteoporosis without current pathological fracture: Secondary | ICD-10-CM | POA: Diagnosis not present

## 2022-05-20 DIAGNOSIS — Z87891 Personal history of nicotine dependence: Secondary | ICD-10-CM | POA: Diagnosis not present

## 2022-05-20 DIAGNOSIS — E785 Hyperlipidemia, unspecified: Secondary | ICD-10-CM | POA: Diagnosis not present

## 2022-05-20 DIAGNOSIS — Z87440 Personal history of urinary (tract) infections: Secondary | ICD-10-CM | POA: Diagnosis not present

## 2022-05-20 DIAGNOSIS — Z96641 Presence of right artificial hip joint: Secondary | ICD-10-CM | POA: Diagnosis not present

## 2022-05-20 DIAGNOSIS — N132 Hydronephrosis with renal and ureteral calculous obstruction: Secondary | ICD-10-CM | POA: Diagnosis not present

## 2022-05-20 DIAGNOSIS — Z7951 Long term (current) use of inhaled steroids: Secondary | ICD-10-CM | POA: Diagnosis not present

## 2022-05-20 DIAGNOSIS — Z8719 Personal history of other diseases of the digestive system: Secondary | ICD-10-CM | POA: Diagnosis not present

## 2022-05-20 DIAGNOSIS — D631 Anemia in chronic kidney disease: Secondary | ICD-10-CM | POA: Diagnosis not present

## 2022-05-20 DIAGNOSIS — K219 Gastro-esophageal reflux disease without esophagitis: Secondary | ICD-10-CM | POA: Diagnosis not present

## 2022-05-20 DIAGNOSIS — I480 Paroxysmal atrial fibrillation: Secondary | ICD-10-CM | POA: Diagnosis not present

## 2022-05-21 DIAGNOSIS — I480 Paroxysmal atrial fibrillation: Secondary | ICD-10-CM | POA: Diagnosis not present

## 2022-05-21 DIAGNOSIS — E785 Hyperlipidemia, unspecified: Secondary | ICD-10-CM | POA: Diagnosis not present

## 2022-05-21 DIAGNOSIS — Z96641 Presence of right artificial hip joint: Secondary | ICD-10-CM | POA: Diagnosis not present

## 2022-05-21 DIAGNOSIS — I420 Dilated cardiomyopathy: Secondary | ICD-10-CM | POA: Diagnosis not present

## 2022-05-21 DIAGNOSIS — S72001D Fracture of unspecified part of neck of right femur, subsequent encounter for closed fracture with routine healing: Secondary | ICD-10-CM | POA: Diagnosis not present

## 2022-05-21 DIAGNOSIS — D631 Anemia in chronic kidney disease: Secondary | ICD-10-CM | POA: Diagnosis not present

## 2022-05-21 DIAGNOSIS — Z8719 Personal history of other diseases of the digestive system: Secondary | ICD-10-CM | POA: Diagnosis not present

## 2022-05-21 DIAGNOSIS — I5032 Chronic diastolic (congestive) heart failure: Secondary | ICD-10-CM | POA: Diagnosis not present

## 2022-05-21 DIAGNOSIS — Z87440 Personal history of urinary (tract) infections: Secondary | ICD-10-CM | POA: Diagnosis not present

## 2022-05-21 DIAGNOSIS — Z87891 Personal history of nicotine dependence: Secondary | ICD-10-CM | POA: Diagnosis not present

## 2022-05-21 DIAGNOSIS — H409 Unspecified glaucoma: Secondary | ICD-10-CM | POA: Diagnosis not present

## 2022-05-21 DIAGNOSIS — J449 Chronic obstructive pulmonary disease, unspecified: Secondary | ICD-10-CM | POA: Diagnosis not present

## 2022-05-21 DIAGNOSIS — N132 Hydronephrosis with renal and ureteral calculous obstruction: Secondary | ICD-10-CM | POA: Diagnosis not present

## 2022-05-21 DIAGNOSIS — I130 Hypertensive heart and chronic kidney disease with heart failure and stage 1 through stage 4 chronic kidney disease, or unspecified chronic kidney disease: Secondary | ICD-10-CM | POA: Diagnosis not present

## 2022-05-21 DIAGNOSIS — Z86711 Personal history of pulmonary embolism: Secondary | ICD-10-CM | POA: Diagnosis not present

## 2022-05-21 DIAGNOSIS — M810 Age-related osteoporosis without current pathological fracture: Secondary | ICD-10-CM | POA: Diagnosis not present

## 2022-05-21 DIAGNOSIS — Z9181 History of falling: Secondary | ICD-10-CM | POA: Diagnosis not present

## 2022-05-21 DIAGNOSIS — Z7951 Long term (current) use of inhaled steroids: Secondary | ICD-10-CM | POA: Diagnosis not present

## 2022-05-21 DIAGNOSIS — K219 Gastro-esophageal reflux disease without esophagitis: Secondary | ICD-10-CM | POA: Diagnosis not present

## 2022-05-21 DIAGNOSIS — N1831 Chronic kidney disease, stage 3a: Secondary | ICD-10-CM | POA: Diagnosis not present

## 2022-05-21 DIAGNOSIS — M47892 Other spondylosis, cervical region: Secondary | ICD-10-CM | POA: Diagnosis not present

## 2022-05-24 DIAGNOSIS — I5032 Chronic diastolic (congestive) heart failure: Secondary | ICD-10-CM | POA: Diagnosis not present

## 2022-05-24 DIAGNOSIS — I130 Hypertensive heart and chronic kidney disease with heart failure and stage 1 through stage 4 chronic kidney disease, or unspecified chronic kidney disease: Secondary | ICD-10-CM | POA: Diagnosis not present

## 2022-05-24 DIAGNOSIS — N1831 Chronic kidney disease, stage 3a: Secondary | ICD-10-CM | POA: Diagnosis not present

## 2022-05-24 DIAGNOSIS — Z8719 Personal history of other diseases of the digestive system: Secondary | ICD-10-CM | POA: Diagnosis not present

## 2022-05-24 DIAGNOSIS — E785 Hyperlipidemia, unspecified: Secondary | ICD-10-CM | POA: Diagnosis not present

## 2022-05-24 DIAGNOSIS — H409 Unspecified glaucoma: Secondary | ICD-10-CM | POA: Diagnosis not present

## 2022-05-24 DIAGNOSIS — N132 Hydronephrosis with renal and ureteral calculous obstruction: Secondary | ICD-10-CM | POA: Diagnosis not present

## 2022-05-24 DIAGNOSIS — K219 Gastro-esophageal reflux disease without esophagitis: Secondary | ICD-10-CM | POA: Diagnosis not present

## 2022-05-24 DIAGNOSIS — D631 Anemia in chronic kidney disease: Secondary | ICD-10-CM | POA: Diagnosis not present

## 2022-05-24 DIAGNOSIS — M47892 Other spondylosis, cervical region: Secondary | ICD-10-CM | POA: Diagnosis not present

## 2022-05-24 DIAGNOSIS — S72001D Fracture of unspecified part of neck of right femur, subsequent encounter for closed fracture with routine healing: Secondary | ICD-10-CM | POA: Diagnosis not present

## 2022-05-24 DIAGNOSIS — Z86711 Personal history of pulmonary embolism: Secondary | ICD-10-CM | POA: Diagnosis not present

## 2022-05-24 DIAGNOSIS — J449 Chronic obstructive pulmonary disease, unspecified: Secondary | ICD-10-CM | POA: Diagnosis not present

## 2022-05-24 DIAGNOSIS — Z96641 Presence of right artificial hip joint: Secondary | ICD-10-CM | POA: Diagnosis not present

## 2022-05-24 DIAGNOSIS — Z9181 History of falling: Secondary | ICD-10-CM | POA: Diagnosis not present

## 2022-05-24 DIAGNOSIS — Z7951 Long term (current) use of inhaled steroids: Secondary | ICD-10-CM | POA: Diagnosis not present

## 2022-05-24 DIAGNOSIS — M810 Age-related osteoporosis without current pathological fracture: Secondary | ICD-10-CM | POA: Diagnosis not present

## 2022-05-24 DIAGNOSIS — Z87440 Personal history of urinary (tract) infections: Secondary | ICD-10-CM | POA: Diagnosis not present

## 2022-05-24 DIAGNOSIS — Z87891 Personal history of nicotine dependence: Secondary | ICD-10-CM | POA: Diagnosis not present

## 2022-05-24 DIAGNOSIS — I480 Paroxysmal atrial fibrillation: Secondary | ICD-10-CM | POA: Diagnosis not present

## 2022-05-24 DIAGNOSIS — I420 Dilated cardiomyopathy: Secondary | ICD-10-CM | POA: Diagnosis not present

## 2022-05-25 DIAGNOSIS — I480 Paroxysmal atrial fibrillation: Secondary | ICD-10-CM | POA: Diagnosis not present

## 2022-05-25 DIAGNOSIS — N1831 Chronic kidney disease, stage 3a: Secondary | ICD-10-CM | POA: Diagnosis not present

## 2022-05-25 DIAGNOSIS — Z9181 History of falling: Secondary | ICD-10-CM | POA: Diagnosis not present

## 2022-05-25 DIAGNOSIS — H409 Unspecified glaucoma: Secondary | ICD-10-CM | POA: Diagnosis not present

## 2022-05-25 DIAGNOSIS — Z86711 Personal history of pulmonary embolism: Secondary | ICD-10-CM | POA: Diagnosis not present

## 2022-05-25 DIAGNOSIS — M810 Age-related osteoporosis without current pathological fracture: Secondary | ICD-10-CM | POA: Diagnosis not present

## 2022-05-25 DIAGNOSIS — N132 Hydronephrosis with renal and ureteral calculous obstruction: Secondary | ICD-10-CM | POA: Diagnosis not present

## 2022-05-25 DIAGNOSIS — I130 Hypertensive heart and chronic kidney disease with heart failure and stage 1 through stage 4 chronic kidney disease, or unspecified chronic kidney disease: Secondary | ICD-10-CM | POA: Diagnosis not present

## 2022-05-25 DIAGNOSIS — J449 Chronic obstructive pulmonary disease, unspecified: Secondary | ICD-10-CM | POA: Diagnosis not present

## 2022-05-25 DIAGNOSIS — Z96641 Presence of right artificial hip joint: Secondary | ICD-10-CM | POA: Diagnosis not present

## 2022-05-25 DIAGNOSIS — S72001D Fracture of unspecified part of neck of right femur, subsequent encounter for closed fracture with routine healing: Secondary | ICD-10-CM | POA: Diagnosis not present

## 2022-05-25 DIAGNOSIS — Z7951 Long term (current) use of inhaled steroids: Secondary | ICD-10-CM | POA: Diagnosis not present

## 2022-05-25 DIAGNOSIS — K219 Gastro-esophageal reflux disease without esophagitis: Secondary | ICD-10-CM | POA: Diagnosis not present

## 2022-05-25 DIAGNOSIS — Z87440 Personal history of urinary (tract) infections: Secondary | ICD-10-CM | POA: Diagnosis not present

## 2022-05-25 DIAGNOSIS — Z87891 Personal history of nicotine dependence: Secondary | ICD-10-CM | POA: Diagnosis not present

## 2022-05-25 DIAGNOSIS — M47892 Other spondylosis, cervical region: Secondary | ICD-10-CM | POA: Diagnosis not present

## 2022-05-25 DIAGNOSIS — D631 Anemia in chronic kidney disease: Secondary | ICD-10-CM | POA: Diagnosis not present

## 2022-05-25 DIAGNOSIS — I420 Dilated cardiomyopathy: Secondary | ICD-10-CM | POA: Diagnosis not present

## 2022-05-25 DIAGNOSIS — I5032 Chronic diastolic (congestive) heart failure: Secondary | ICD-10-CM | POA: Diagnosis not present

## 2022-05-25 DIAGNOSIS — Z8719 Personal history of other diseases of the digestive system: Secondary | ICD-10-CM | POA: Diagnosis not present

## 2022-05-25 DIAGNOSIS — E785 Hyperlipidemia, unspecified: Secondary | ICD-10-CM | POA: Diagnosis not present

## 2022-05-26 DIAGNOSIS — I130 Hypertensive heart and chronic kidney disease with heart failure and stage 1 through stage 4 chronic kidney disease, or unspecified chronic kidney disease: Secondary | ICD-10-CM | POA: Diagnosis not present

## 2022-05-26 DIAGNOSIS — S72001D Fracture of unspecified part of neck of right femur, subsequent encounter for closed fracture with routine healing: Secondary | ICD-10-CM | POA: Diagnosis not present

## 2022-05-26 DIAGNOSIS — I420 Dilated cardiomyopathy: Secondary | ICD-10-CM | POA: Diagnosis not present

## 2022-05-26 DIAGNOSIS — M810 Age-related osteoporosis without current pathological fracture: Secondary | ICD-10-CM | POA: Diagnosis not present

## 2022-05-26 DIAGNOSIS — Z7951 Long term (current) use of inhaled steroids: Secondary | ICD-10-CM | POA: Diagnosis not present

## 2022-05-26 DIAGNOSIS — N132 Hydronephrosis with renal and ureteral calculous obstruction: Secondary | ICD-10-CM | POA: Diagnosis not present

## 2022-05-26 DIAGNOSIS — N1831 Chronic kidney disease, stage 3a: Secondary | ICD-10-CM | POA: Diagnosis not present

## 2022-05-26 DIAGNOSIS — H409 Unspecified glaucoma: Secondary | ICD-10-CM | POA: Diagnosis not present

## 2022-05-26 DIAGNOSIS — K219 Gastro-esophageal reflux disease without esophagitis: Secondary | ICD-10-CM | POA: Diagnosis not present

## 2022-05-26 DIAGNOSIS — E785 Hyperlipidemia, unspecified: Secondary | ICD-10-CM | POA: Diagnosis not present

## 2022-05-26 DIAGNOSIS — I5032 Chronic diastolic (congestive) heart failure: Secondary | ICD-10-CM | POA: Diagnosis not present

## 2022-05-26 DIAGNOSIS — D631 Anemia in chronic kidney disease: Secondary | ICD-10-CM | POA: Diagnosis not present

## 2022-05-26 DIAGNOSIS — Z9181 History of falling: Secondary | ICD-10-CM | POA: Diagnosis not present

## 2022-05-26 DIAGNOSIS — Z87891 Personal history of nicotine dependence: Secondary | ICD-10-CM | POA: Diagnosis not present

## 2022-05-26 DIAGNOSIS — Z96641 Presence of right artificial hip joint: Secondary | ICD-10-CM | POA: Diagnosis not present

## 2022-05-26 DIAGNOSIS — Z8719 Personal history of other diseases of the digestive system: Secondary | ICD-10-CM | POA: Diagnosis not present

## 2022-05-26 DIAGNOSIS — J449 Chronic obstructive pulmonary disease, unspecified: Secondary | ICD-10-CM | POA: Diagnosis not present

## 2022-05-26 DIAGNOSIS — Z86711 Personal history of pulmonary embolism: Secondary | ICD-10-CM | POA: Diagnosis not present

## 2022-05-26 DIAGNOSIS — M47892 Other spondylosis, cervical region: Secondary | ICD-10-CM | POA: Diagnosis not present

## 2022-05-26 DIAGNOSIS — Z87440 Personal history of urinary (tract) infections: Secondary | ICD-10-CM | POA: Diagnosis not present

## 2022-05-26 DIAGNOSIS — I480 Paroxysmal atrial fibrillation: Secondary | ICD-10-CM | POA: Diagnosis not present

## 2022-05-28 DIAGNOSIS — Z8719 Personal history of other diseases of the digestive system: Secondary | ICD-10-CM | POA: Diagnosis not present

## 2022-05-28 DIAGNOSIS — K219 Gastro-esophageal reflux disease without esophagitis: Secondary | ICD-10-CM | POA: Diagnosis not present

## 2022-05-28 DIAGNOSIS — Z87440 Personal history of urinary (tract) infections: Secondary | ICD-10-CM | POA: Diagnosis not present

## 2022-05-28 DIAGNOSIS — S72001D Fracture of unspecified part of neck of right femur, subsequent encounter for closed fracture with routine healing: Secondary | ICD-10-CM | POA: Diagnosis not present

## 2022-05-28 DIAGNOSIS — Z86711 Personal history of pulmonary embolism: Secondary | ICD-10-CM | POA: Diagnosis not present

## 2022-05-28 DIAGNOSIS — H409 Unspecified glaucoma: Secondary | ICD-10-CM | POA: Diagnosis not present

## 2022-05-28 DIAGNOSIS — M47892 Other spondylosis, cervical region: Secondary | ICD-10-CM | POA: Diagnosis not present

## 2022-05-28 DIAGNOSIS — I5032 Chronic diastolic (congestive) heart failure: Secondary | ICD-10-CM | POA: Diagnosis not present

## 2022-05-28 DIAGNOSIS — I480 Paroxysmal atrial fibrillation: Secondary | ICD-10-CM | POA: Diagnosis not present

## 2022-05-28 DIAGNOSIS — M810 Age-related osteoporosis without current pathological fracture: Secondary | ICD-10-CM | POA: Diagnosis not present

## 2022-05-28 DIAGNOSIS — Z7951 Long term (current) use of inhaled steroids: Secondary | ICD-10-CM | POA: Diagnosis not present

## 2022-05-28 DIAGNOSIS — Z96641 Presence of right artificial hip joint: Secondary | ICD-10-CM | POA: Diagnosis not present

## 2022-05-28 DIAGNOSIS — I420 Dilated cardiomyopathy: Secondary | ICD-10-CM | POA: Diagnosis not present

## 2022-05-28 DIAGNOSIS — E785 Hyperlipidemia, unspecified: Secondary | ICD-10-CM | POA: Diagnosis not present

## 2022-05-28 DIAGNOSIS — D631 Anemia in chronic kidney disease: Secondary | ICD-10-CM | POA: Diagnosis not present

## 2022-05-28 DIAGNOSIS — N132 Hydronephrosis with renal and ureteral calculous obstruction: Secondary | ICD-10-CM | POA: Diagnosis not present

## 2022-05-28 DIAGNOSIS — I130 Hypertensive heart and chronic kidney disease with heart failure and stage 1 through stage 4 chronic kidney disease, or unspecified chronic kidney disease: Secondary | ICD-10-CM | POA: Diagnosis not present

## 2022-05-28 DIAGNOSIS — Z9181 History of falling: Secondary | ICD-10-CM | POA: Diagnosis not present

## 2022-05-28 DIAGNOSIS — N1831 Chronic kidney disease, stage 3a: Secondary | ICD-10-CM | POA: Diagnosis not present

## 2022-05-28 DIAGNOSIS — Z87891 Personal history of nicotine dependence: Secondary | ICD-10-CM | POA: Diagnosis not present

## 2022-05-28 DIAGNOSIS — J449 Chronic obstructive pulmonary disease, unspecified: Secondary | ICD-10-CM | POA: Diagnosis not present

## 2022-05-31 ENCOUNTER — Ambulatory Visit: Payer: Medicare Other

## 2022-06-01 ENCOUNTER — Ambulatory Visit (INDEPENDENT_AMBULATORY_CARE_PROVIDER_SITE_OTHER): Payer: Medicare Other

## 2022-06-01 DIAGNOSIS — H3092 Unspecified chorioretinal inflammation, left eye: Secondary | ICD-10-CM | POA: Diagnosis not present

## 2022-06-01 DIAGNOSIS — H401124 Primary open-angle glaucoma, left eye, indeterminate stage: Secondary | ICD-10-CM | POA: Diagnosis not present

## 2022-06-01 DIAGNOSIS — E538 Deficiency of other specified B group vitamins: Secondary | ICD-10-CM

## 2022-06-01 DIAGNOSIS — H401112 Primary open-angle glaucoma, right eye, moderate stage: Secondary | ICD-10-CM | POA: Diagnosis not present

## 2022-06-01 DIAGNOSIS — H34812 Central retinal vein occlusion, left eye, with macular edema: Secondary | ICD-10-CM | POA: Diagnosis not present

## 2022-06-01 MED ORDER — CYANOCOBALAMIN 1000 MCG/ML IJ SOLN
1000.0000 ug | Freq: Once | INTRAMUSCULAR | Status: AC
  Administered 2022-06-01: 1000 ug via INTRAMUSCULAR

## 2022-06-01 NOTE — Progress Notes (Signed)
Patient presented for B 12 injection to left deltoid, patient voiced no concerns nor showed any signs of distress during injection. 

## 2022-06-02 DIAGNOSIS — Z87440 Personal history of urinary (tract) infections: Secondary | ICD-10-CM | POA: Diagnosis not present

## 2022-06-02 DIAGNOSIS — M47892 Other spondylosis, cervical region: Secondary | ICD-10-CM | POA: Diagnosis not present

## 2022-06-02 DIAGNOSIS — D631 Anemia in chronic kidney disease: Secondary | ICD-10-CM | POA: Diagnosis not present

## 2022-06-02 DIAGNOSIS — Z86711 Personal history of pulmonary embolism: Secondary | ICD-10-CM | POA: Diagnosis not present

## 2022-06-02 DIAGNOSIS — Z87891 Personal history of nicotine dependence: Secondary | ICD-10-CM | POA: Diagnosis not present

## 2022-06-02 DIAGNOSIS — Z7951 Long term (current) use of inhaled steroids: Secondary | ICD-10-CM | POA: Diagnosis not present

## 2022-06-02 DIAGNOSIS — N1831 Chronic kidney disease, stage 3a: Secondary | ICD-10-CM | POA: Diagnosis not present

## 2022-06-02 DIAGNOSIS — I480 Paroxysmal atrial fibrillation: Secondary | ICD-10-CM | POA: Diagnosis not present

## 2022-06-02 DIAGNOSIS — I130 Hypertensive heart and chronic kidney disease with heart failure and stage 1 through stage 4 chronic kidney disease, or unspecified chronic kidney disease: Secondary | ICD-10-CM | POA: Diagnosis not present

## 2022-06-02 DIAGNOSIS — Z9181 History of falling: Secondary | ICD-10-CM | POA: Diagnosis not present

## 2022-06-02 DIAGNOSIS — M810 Age-related osteoporosis without current pathological fracture: Secondary | ICD-10-CM | POA: Diagnosis not present

## 2022-06-02 DIAGNOSIS — E785 Hyperlipidemia, unspecified: Secondary | ICD-10-CM | POA: Diagnosis not present

## 2022-06-02 DIAGNOSIS — H409 Unspecified glaucoma: Secondary | ICD-10-CM | POA: Diagnosis not present

## 2022-06-02 DIAGNOSIS — K219 Gastro-esophageal reflux disease without esophagitis: Secondary | ICD-10-CM | POA: Diagnosis not present

## 2022-06-02 DIAGNOSIS — I5032 Chronic diastolic (congestive) heart failure: Secondary | ICD-10-CM | POA: Diagnosis not present

## 2022-06-02 DIAGNOSIS — N132 Hydronephrosis with renal and ureteral calculous obstruction: Secondary | ICD-10-CM | POA: Diagnosis not present

## 2022-06-02 DIAGNOSIS — S72001D Fracture of unspecified part of neck of right femur, subsequent encounter for closed fracture with routine healing: Secondary | ICD-10-CM | POA: Diagnosis not present

## 2022-06-02 DIAGNOSIS — Z8719 Personal history of other diseases of the digestive system: Secondary | ICD-10-CM | POA: Diagnosis not present

## 2022-06-02 DIAGNOSIS — J449 Chronic obstructive pulmonary disease, unspecified: Secondary | ICD-10-CM | POA: Diagnosis not present

## 2022-06-02 DIAGNOSIS — I420 Dilated cardiomyopathy: Secondary | ICD-10-CM | POA: Diagnosis not present

## 2022-06-02 DIAGNOSIS — Z96641 Presence of right artificial hip joint: Secondary | ICD-10-CM | POA: Diagnosis not present

## 2022-06-03 DIAGNOSIS — I5032 Chronic diastolic (congestive) heart failure: Secondary | ICD-10-CM | POA: Diagnosis not present

## 2022-06-03 DIAGNOSIS — E785 Hyperlipidemia, unspecified: Secondary | ICD-10-CM | POA: Diagnosis not present

## 2022-06-03 DIAGNOSIS — Z96641 Presence of right artificial hip joint: Secondary | ICD-10-CM | POA: Diagnosis not present

## 2022-06-03 DIAGNOSIS — S72001D Fracture of unspecified part of neck of right femur, subsequent encounter for closed fracture with routine healing: Secondary | ICD-10-CM | POA: Diagnosis not present

## 2022-06-03 DIAGNOSIS — K219 Gastro-esophageal reflux disease without esophagitis: Secondary | ICD-10-CM | POA: Diagnosis not present

## 2022-06-03 DIAGNOSIS — J449 Chronic obstructive pulmonary disease, unspecified: Secondary | ICD-10-CM | POA: Diagnosis not present

## 2022-06-03 DIAGNOSIS — I480 Paroxysmal atrial fibrillation: Secondary | ICD-10-CM | POA: Diagnosis not present

## 2022-06-03 DIAGNOSIS — M810 Age-related osteoporosis without current pathological fracture: Secondary | ICD-10-CM | POA: Diagnosis not present

## 2022-06-03 DIAGNOSIS — Z7951 Long term (current) use of inhaled steroids: Secondary | ICD-10-CM | POA: Diagnosis not present

## 2022-06-03 DIAGNOSIS — Z87440 Personal history of urinary (tract) infections: Secondary | ICD-10-CM | POA: Diagnosis not present

## 2022-06-03 DIAGNOSIS — N1831 Chronic kidney disease, stage 3a: Secondary | ICD-10-CM | POA: Diagnosis not present

## 2022-06-03 DIAGNOSIS — Z87891 Personal history of nicotine dependence: Secondary | ICD-10-CM | POA: Diagnosis not present

## 2022-06-03 DIAGNOSIS — N132 Hydronephrosis with renal and ureteral calculous obstruction: Secondary | ICD-10-CM | POA: Diagnosis not present

## 2022-06-03 DIAGNOSIS — Z86711 Personal history of pulmonary embolism: Secondary | ICD-10-CM | POA: Diagnosis not present

## 2022-06-03 DIAGNOSIS — Z8719 Personal history of other diseases of the digestive system: Secondary | ICD-10-CM | POA: Diagnosis not present

## 2022-06-03 DIAGNOSIS — Z9181 History of falling: Secondary | ICD-10-CM | POA: Diagnosis not present

## 2022-06-03 DIAGNOSIS — I420 Dilated cardiomyopathy: Secondary | ICD-10-CM | POA: Diagnosis not present

## 2022-06-03 DIAGNOSIS — M47892 Other spondylosis, cervical region: Secondary | ICD-10-CM | POA: Diagnosis not present

## 2022-06-03 DIAGNOSIS — D631 Anemia in chronic kidney disease: Secondary | ICD-10-CM | POA: Diagnosis not present

## 2022-06-03 DIAGNOSIS — H409 Unspecified glaucoma: Secondary | ICD-10-CM | POA: Diagnosis not present

## 2022-06-03 DIAGNOSIS — I130 Hypertensive heart and chronic kidney disease with heart failure and stage 1 through stage 4 chronic kidney disease, or unspecified chronic kidney disease: Secondary | ICD-10-CM | POA: Diagnosis not present

## 2022-06-08 DIAGNOSIS — I5032 Chronic diastolic (congestive) heart failure: Secondary | ICD-10-CM | POA: Diagnosis not present

## 2022-06-08 DIAGNOSIS — S72001D Fracture of unspecified part of neck of right femur, subsequent encounter for closed fracture with routine healing: Secondary | ICD-10-CM | POA: Diagnosis not present

## 2022-06-08 DIAGNOSIS — K219 Gastro-esophageal reflux disease without esophagitis: Secondary | ICD-10-CM | POA: Diagnosis not present

## 2022-06-08 DIAGNOSIS — Z7951 Long term (current) use of inhaled steroids: Secondary | ICD-10-CM | POA: Diagnosis not present

## 2022-06-08 DIAGNOSIS — D631 Anemia in chronic kidney disease: Secondary | ICD-10-CM | POA: Diagnosis not present

## 2022-06-08 DIAGNOSIS — Z9181 History of falling: Secondary | ICD-10-CM | POA: Diagnosis not present

## 2022-06-08 DIAGNOSIS — N132 Hydronephrosis with renal and ureteral calculous obstruction: Secondary | ICD-10-CM | POA: Diagnosis not present

## 2022-06-08 DIAGNOSIS — Z87440 Personal history of urinary (tract) infections: Secondary | ICD-10-CM | POA: Diagnosis not present

## 2022-06-08 DIAGNOSIS — Z86711 Personal history of pulmonary embolism: Secondary | ICD-10-CM | POA: Diagnosis not present

## 2022-06-08 DIAGNOSIS — Z87891 Personal history of nicotine dependence: Secondary | ICD-10-CM | POA: Diagnosis not present

## 2022-06-08 DIAGNOSIS — M47892 Other spondylosis, cervical region: Secondary | ICD-10-CM | POA: Diagnosis not present

## 2022-06-08 DIAGNOSIS — H409 Unspecified glaucoma: Secondary | ICD-10-CM | POA: Diagnosis not present

## 2022-06-08 DIAGNOSIS — Z8719 Personal history of other diseases of the digestive system: Secondary | ICD-10-CM | POA: Diagnosis not present

## 2022-06-08 DIAGNOSIS — M810 Age-related osteoporosis without current pathological fracture: Secondary | ICD-10-CM | POA: Diagnosis not present

## 2022-06-08 DIAGNOSIS — Z96641 Presence of right artificial hip joint: Secondary | ICD-10-CM | POA: Diagnosis not present

## 2022-06-08 DIAGNOSIS — E785 Hyperlipidemia, unspecified: Secondary | ICD-10-CM | POA: Diagnosis not present

## 2022-06-08 DIAGNOSIS — I420 Dilated cardiomyopathy: Secondary | ICD-10-CM | POA: Diagnosis not present

## 2022-06-08 DIAGNOSIS — I130 Hypertensive heart and chronic kidney disease with heart failure and stage 1 through stage 4 chronic kidney disease, or unspecified chronic kidney disease: Secondary | ICD-10-CM | POA: Diagnosis not present

## 2022-06-08 DIAGNOSIS — J449 Chronic obstructive pulmonary disease, unspecified: Secondary | ICD-10-CM | POA: Diagnosis not present

## 2022-06-08 DIAGNOSIS — N1831 Chronic kidney disease, stage 3a: Secondary | ICD-10-CM | POA: Diagnosis not present

## 2022-06-08 DIAGNOSIS — I480 Paroxysmal atrial fibrillation: Secondary | ICD-10-CM | POA: Diagnosis not present

## 2022-06-09 DIAGNOSIS — I420 Dilated cardiomyopathy: Secondary | ICD-10-CM | POA: Diagnosis not present

## 2022-06-09 DIAGNOSIS — S72001D Fracture of unspecified part of neck of right femur, subsequent encounter for closed fracture with routine healing: Secondary | ICD-10-CM | POA: Diagnosis not present

## 2022-06-09 DIAGNOSIS — I130 Hypertensive heart and chronic kidney disease with heart failure and stage 1 through stage 4 chronic kidney disease, or unspecified chronic kidney disease: Secondary | ICD-10-CM | POA: Diagnosis not present

## 2022-06-09 DIAGNOSIS — K219 Gastro-esophageal reflux disease without esophagitis: Secondary | ICD-10-CM | POA: Diagnosis not present

## 2022-06-09 DIAGNOSIS — Z96641 Presence of right artificial hip joint: Secondary | ICD-10-CM | POA: Diagnosis not present

## 2022-06-09 DIAGNOSIS — H409 Unspecified glaucoma: Secondary | ICD-10-CM | POA: Diagnosis not present

## 2022-06-09 DIAGNOSIS — Z9181 History of falling: Secondary | ICD-10-CM | POA: Diagnosis not present

## 2022-06-09 DIAGNOSIS — I5032 Chronic diastolic (congestive) heart failure: Secondary | ICD-10-CM | POA: Diagnosis not present

## 2022-06-09 DIAGNOSIS — Z86711 Personal history of pulmonary embolism: Secondary | ICD-10-CM | POA: Diagnosis not present

## 2022-06-09 DIAGNOSIS — N132 Hydronephrosis with renal and ureteral calculous obstruction: Secondary | ICD-10-CM | POA: Diagnosis not present

## 2022-06-09 DIAGNOSIS — E785 Hyperlipidemia, unspecified: Secondary | ICD-10-CM | POA: Diagnosis not present

## 2022-06-09 DIAGNOSIS — N1831 Chronic kidney disease, stage 3a: Secondary | ICD-10-CM | POA: Diagnosis not present

## 2022-06-09 DIAGNOSIS — I480 Paroxysmal atrial fibrillation: Secondary | ICD-10-CM | POA: Diagnosis not present

## 2022-06-09 DIAGNOSIS — Z7951 Long term (current) use of inhaled steroids: Secondary | ICD-10-CM | POA: Diagnosis not present

## 2022-06-09 DIAGNOSIS — Z8719 Personal history of other diseases of the digestive system: Secondary | ICD-10-CM | POA: Diagnosis not present

## 2022-06-09 DIAGNOSIS — M47892 Other spondylosis, cervical region: Secondary | ICD-10-CM | POA: Diagnosis not present

## 2022-06-09 DIAGNOSIS — J449 Chronic obstructive pulmonary disease, unspecified: Secondary | ICD-10-CM | POA: Diagnosis not present

## 2022-06-09 DIAGNOSIS — Z87891 Personal history of nicotine dependence: Secondary | ICD-10-CM | POA: Diagnosis not present

## 2022-06-09 DIAGNOSIS — D631 Anemia in chronic kidney disease: Secondary | ICD-10-CM | POA: Diagnosis not present

## 2022-06-09 DIAGNOSIS — M810 Age-related osteoporosis without current pathological fracture: Secondary | ICD-10-CM | POA: Diagnosis not present

## 2022-06-09 DIAGNOSIS — Z87440 Personal history of urinary (tract) infections: Secondary | ICD-10-CM | POA: Diagnosis not present

## 2022-06-10 DIAGNOSIS — Z96641 Presence of right artificial hip joint: Secondary | ICD-10-CM | POA: Diagnosis not present

## 2022-06-10 DIAGNOSIS — Z87440 Personal history of urinary (tract) infections: Secondary | ICD-10-CM | POA: Diagnosis not present

## 2022-06-10 DIAGNOSIS — M47892 Other spondylosis, cervical region: Secondary | ICD-10-CM | POA: Diagnosis not present

## 2022-06-10 DIAGNOSIS — I480 Paroxysmal atrial fibrillation: Secondary | ICD-10-CM | POA: Diagnosis not present

## 2022-06-10 DIAGNOSIS — Z86711 Personal history of pulmonary embolism: Secondary | ICD-10-CM | POA: Diagnosis not present

## 2022-06-10 DIAGNOSIS — Z87891 Personal history of nicotine dependence: Secondary | ICD-10-CM | POA: Diagnosis not present

## 2022-06-10 DIAGNOSIS — Z9181 History of falling: Secondary | ICD-10-CM | POA: Diagnosis not present

## 2022-06-10 DIAGNOSIS — I420 Dilated cardiomyopathy: Secondary | ICD-10-CM | POA: Diagnosis not present

## 2022-06-10 DIAGNOSIS — S72001D Fracture of unspecified part of neck of right femur, subsequent encounter for closed fracture with routine healing: Secondary | ICD-10-CM | POA: Diagnosis not present

## 2022-06-10 DIAGNOSIS — M810 Age-related osteoporosis without current pathological fracture: Secondary | ICD-10-CM | POA: Diagnosis not present

## 2022-06-10 DIAGNOSIS — Z7951 Long term (current) use of inhaled steroids: Secondary | ICD-10-CM | POA: Diagnosis not present

## 2022-06-10 DIAGNOSIS — Z8719 Personal history of other diseases of the digestive system: Secondary | ICD-10-CM | POA: Diagnosis not present

## 2022-06-10 DIAGNOSIS — H409 Unspecified glaucoma: Secondary | ICD-10-CM | POA: Diagnosis not present

## 2022-06-10 DIAGNOSIS — I5032 Chronic diastolic (congestive) heart failure: Secondary | ICD-10-CM | POA: Diagnosis not present

## 2022-06-10 DIAGNOSIS — I130 Hypertensive heart and chronic kidney disease with heart failure and stage 1 through stage 4 chronic kidney disease, or unspecified chronic kidney disease: Secondary | ICD-10-CM | POA: Diagnosis not present

## 2022-06-10 DIAGNOSIS — D631 Anemia in chronic kidney disease: Secondary | ICD-10-CM | POA: Diagnosis not present

## 2022-06-10 DIAGNOSIS — K219 Gastro-esophageal reflux disease without esophagitis: Secondary | ICD-10-CM | POA: Diagnosis not present

## 2022-06-10 DIAGNOSIS — N1831 Chronic kidney disease, stage 3a: Secondary | ICD-10-CM | POA: Diagnosis not present

## 2022-06-10 DIAGNOSIS — E785 Hyperlipidemia, unspecified: Secondary | ICD-10-CM | POA: Diagnosis not present

## 2022-06-10 DIAGNOSIS — N132 Hydronephrosis with renal and ureteral calculous obstruction: Secondary | ICD-10-CM | POA: Diagnosis not present

## 2022-06-10 DIAGNOSIS — J449 Chronic obstructive pulmonary disease, unspecified: Secondary | ICD-10-CM | POA: Diagnosis not present

## 2022-06-11 DIAGNOSIS — H34812 Central retinal vein occlusion, left eye, with macular edema: Secondary | ICD-10-CM | POA: Diagnosis not present

## 2022-06-15 DIAGNOSIS — Z8719 Personal history of other diseases of the digestive system: Secondary | ICD-10-CM | POA: Diagnosis not present

## 2022-06-15 DIAGNOSIS — Z87891 Personal history of nicotine dependence: Secondary | ICD-10-CM | POA: Diagnosis not present

## 2022-06-15 DIAGNOSIS — Z86711 Personal history of pulmonary embolism: Secondary | ICD-10-CM | POA: Diagnosis not present

## 2022-06-15 DIAGNOSIS — K219 Gastro-esophageal reflux disease without esophagitis: Secondary | ICD-10-CM | POA: Diagnosis not present

## 2022-06-15 DIAGNOSIS — M47892 Other spondylosis, cervical region: Secondary | ICD-10-CM | POA: Diagnosis not present

## 2022-06-15 DIAGNOSIS — S72001D Fracture of unspecified part of neck of right femur, subsequent encounter for closed fracture with routine healing: Secondary | ICD-10-CM | POA: Diagnosis not present

## 2022-06-15 DIAGNOSIS — Z87440 Personal history of urinary (tract) infections: Secondary | ICD-10-CM | POA: Diagnosis not present

## 2022-06-15 DIAGNOSIS — I5032 Chronic diastolic (congestive) heart failure: Secondary | ICD-10-CM | POA: Diagnosis not present

## 2022-06-15 DIAGNOSIS — J449 Chronic obstructive pulmonary disease, unspecified: Secondary | ICD-10-CM | POA: Diagnosis not present

## 2022-06-15 DIAGNOSIS — H409 Unspecified glaucoma: Secondary | ICD-10-CM | POA: Diagnosis not present

## 2022-06-15 DIAGNOSIS — I480 Paroxysmal atrial fibrillation: Secondary | ICD-10-CM | POA: Diagnosis not present

## 2022-06-15 DIAGNOSIS — E785 Hyperlipidemia, unspecified: Secondary | ICD-10-CM | POA: Diagnosis not present

## 2022-06-15 DIAGNOSIS — Z96641 Presence of right artificial hip joint: Secondary | ICD-10-CM | POA: Diagnosis not present

## 2022-06-15 DIAGNOSIS — M810 Age-related osteoporosis without current pathological fracture: Secondary | ICD-10-CM | POA: Diagnosis not present

## 2022-06-15 DIAGNOSIS — I420 Dilated cardiomyopathy: Secondary | ICD-10-CM | POA: Diagnosis not present

## 2022-06-15 DIAGNOSIS — Z7951 Long term (current) use of inhaled steroids: Secondary | ICD-10-CM | POA: Diagnosis not present

## 2022-06-15 DIAGNOSIS — Z9181 History of falling: Secondary | ICD-10-CM | POA: Diagnosis not present

## 2022-06-15 DIAGNOSIS — D631 Anemia in chronic kidney disease: Secondary | ICD-10-CM | POA: Diagnosis not present

## 2022-06-15 DIAGNOSIS — N1831 Chronic kidney disease, stage 3a: Secondary | ICD-10-CM | POA: Diagnosis not present

## 2022-06-15 DIAGNOSIS — I130 Hypertensive heart and chronic kidney disease with heart failure and stage 1 through stage 4 chronic kidney disease, or unspecified chronic kidney disease: Secondary | ICD-10-CM | POA: Diagnosis not present

## 2022-06-15 DIAGNOSIS — N132 Hydronephrosis with renal and ureteral calculous obstruction: Secondary | ICD-10-CM | POA: Diagnosis not present

## 2022-06-16 DIAGNOSIS — Z87891 Personal history of nicotine dependence: Secondary | ICD-10-CM | POA: Diagnosis not present

## 2022-06-16 DIAGNOSIS — Z7951 Long term (current) use of inhaled steroids: Secondary | ICD-10-CM | POA: Diagnosis not present

## 2022-06-16 DIAGNOSIS — K219 Gastro-esophageal reflux disease without esophagitis: Secondary | ICD-10-CM | POA: Diagnosis not present

## 2022-06-16 DIAGNOSIS — M810 Age-related osteoporosis without current pathological fracture: Secondary | ICD-10-CM | POA: Diagnosis not present

## 2022-06-16 DIAGNOSIS — I420 Dilated cardiomyopathy: Secondary | ICD-10-CM | POA: Diagnosis not present

## 2022-06-16 DIAGNOSIS — I5032 Chronic diastolic (congestive) heart failure: Secondary | ICD-10-CM | POA: Diagnosis not present

## 2022-06-16 DIAGNOSIS — J449 Chronic obstructive pulmonary disease, unspecified: Secondary | ICD-10-CM | POA: Diagnosis not present

## 2022-06-16 DIAGNOSIS — H409 Unspecified glaucoma: Secondary | ICD-10-CM | POA: Diagnosis not present

## 2022-06-16 DIAGNOSIS — Z87440 Personal history of urinary (tract) infections: Secondary | ICD-10-CM | POA: Diagnosis not present

## 2022-06-16 DIAGNOSIS — Z96641 Presence of right artificial hip joint: Secondary | ICD-10-CM | POA: Diagnosis not present

## 2022-06-16 DIAGNOSIS — N1831 Chronic kidney disease, stage 3a: Secondary | ICD-10-CM | POA: Diagnosis not present

## 2022-06-16 DIAGNOSIS — D631 Anemia in chronic kidney disease: Secondary | ICD-10-CM | POA: Diagnosis not present

## 2022-06-16 DIAGNOSIS — I130 Hypertensive heart and chronic kidney disease with heart failure and stage 1 through stage 4 chronic kidney disease, or unspecified chronic kidney disease: Secondary | ICD-10-CM | POA: Diagnosis not present

## 2022-06-16 DIAGNOSIS — Z8719 Personal history of other diseases of the digestive system: Secondary | ICD-10-CM | POA: Diagnosis not present

## 2022-06-16 DIAGNOSIS — M47892 Other spondylosis, cervical region: Secondary | ICD-10-CM | POA: Diagnosis not present

## 2022-06-16 DIAGNOSIS — E785 Hyperlipidemia, unspecified: Secondary | ICD-10-CM | POA: Diagnosis not present

## 2022-06-16 DIAGNOSIS — Z86711 Personal history of pulmonary embolism: Secondary | ICD-10-CM | POA: Diagnosis not present

## 2022-06-16 DIAGNOSIS — I480 Paroxysmal atrial fibrillation: Secondary | ICD-10-CM | POA: Diagnosis not present

## 2022-06-16 DIAGNOSIS — N132 Hydronephrosis with renal and ureteral calculous obstruction: Secondary | ICD-10-CM | POA: Diagnosis not present

## 2022-06-16 DIAGNOSIS — S72001D Fracture of unspecified part of neck of right femur, subsequent encounter for closed fracture with routine healing: Secondary | ICD-10-CM | POA: Diagnosis not present

## 2022-06-16 DIAGNOSIS — Z9181 History of falling: Secondary | ICD-10-CM | POA: Diagnosis not present

## 2022-06-21 DIAGNOSIS — I482 Chronic atrial fibrillation, unspecified: Secondary | ICD-10-CM | POA: Diagnosis not present

## 2022-06-21 DIAGNOSIS — I7121 Aneurysm of the ascending aorta, without rupture: Secondary | ICD-10-CM | POA: Diagnosis not present

## 2022-06-21 DIAGNOSIS — J9601 Acute respiratory failure with hypoxia: Secondary | ICD-10-CM | POA: Diagnosis not present

## 2022-06-21 DIAGNOSIS — I480 Paroxysmal atrial fibrillation: Secondary | ICD-10-CM | POA: Diagnosis not present

## 2022-06-21 DIAGNOSIS — R0602 Shortness of breath: Secondary | ICD-10-CM | POA: Diagnosis not present

## 2022-06-21 DIAGNOSIS — I10 Essential (primary) hypertension: Secondary | ICD-10-CM | POA: Diagnosis not present

## 2022-06-21 DIAGNOSIS — I420 Dilated cardiomyopathy: Secondary | ICD-10-CM | POA: Diagnosis not present

## 2022-06-21 DIAGNOSIS — J439 Emphysema, unspecified: Secondary | ICD-10-CM | POA: Diagnosis not present

## 2022-06-23 DIAGNOSIS — E785 Hyperlipidemia, unspecified: Secondary | ICD-10-CM | POA: Diagnosis not present

## 2022-06-23 DIAGNOSIS — I420 Dilated cardiomyopathy: Secondary | ICD-10-CM | POA: Diagnosis not present

## 2022-06-23 DIAGNOSIS — K219 Gastro-esophageal reflux disease without esophagitis: Secondary | ICD-10-CM | POA: Diagnosis not present

## 2022-06-23 DIAGNOSIS — M47892 Other spondylosis, cervical region: Secondary | ICD-10-CM | POA: Diagnosis not present

## 2022-06-23 DIAGNOSIS — N132 Hydronephrosis with renal and ureteral calculous obstruction: Secondary | ICD-10-CM | POA: Diagnosis not present

## 2022-06-23 DIAGNOSIS — Z87891 Personal history of nicotine dependence: Secondary | ICD-10-CM | POA: Diagnosis not present

## 2022-06-23 DIAGNOSIS — Z9181 History of falling: Secondary | ICD-10-CM | POA: Diagnosis not present

## 2022-06-23 DIAGNOSIS — I130 Hypertensive heart and chronic kidney disease with heart failure and stage 1 through stage 4 chronic kidney disease, or unspecified chronic kidney disease: Secondary | ICD-10-CM | POA: Diagnosis not present

## 2022-06-23 DIAGNOSIS — I5032 Chronic diastolic (congestive) heart failure: Secondary | ICD-10-CM | POA: Diagnosis not present

## 2022-06-23 DIAGNOSIS — N1831 Chronic kidney disease, stage 3a: Secondary | ICD-10-CM | POA: Diagnosis not present

## 2022-06-23 DIAGNOSIS — Z87440 Personal history of urinary (tract) infections: Secondary | ICD-10-CM | POA: Diagnosis not present

## 2022-06-23 DIAGNOSIS — Z96641 Presence of right artificial hip joint: Secondary | ICD-10-CM | POA: Diagnosis not present

## 2022-06-23 DIAGNOSIS — Z8719 Personal history of other diseases of the digestive system: Secondary | ICD-10-CM | POA: Diagnosis not present

## 2022-06-23 DIAGNOSIS — Z86711 Personal history of pulmonary embolism: Secondary | ICD-10-CM | POA: Diagnosis not present

## 2022-06-23 DIAGNOSIS — M810 Age-related osteoporosis without current pathological fracture: Secondary | ICD-10-CM | POA: Diagnosis not present

## 2022-06-23 DIAGNOSIS — I480 Paroxysmal atrial fibrillation: Secondary | ICD-10-CM | POA: Diagnosis not present

## 2022-06-23 DIAGNOSIS — J449 Chronic obstructive pulmonary disease, unspecified: Secondary | ICD-10-CM | POA: Diagnosis not present

## 2022-06-23 DIAGNOSIS — S72001D Fracture of unspecified part of neck of right femur, subsequent encounter for closed fracture with routine healing: Secondary | ICD-10-CM | POA: Diagnosis not present

## 2022-06-23 DIAGNOSIS — Z7951 Long term (current) use of inhaled steroids: Secondary | ICD-10-CM | POA: Diagnosis not present

## 2022-06-23 DIAGNOSIS — D631 Anemia in chronic kidney disease: Secondary | ICD-10-CM | POA: Diagnosis not present

## 2022-06-23 DIAGNOSIS — H409 Unspecified glaucoma: Secondary | ICD-10-CM | POA: Diagnosis not present

## 2022-06-24 DIAGNOSIS — J449 Chronic obstructive pulmonary disease, unspecified: Secondary | ICD-10-CM | POA: Diagnosis not present

## 2022-06-24 DIAGNOSIS — S72001D Fracture of unspecified part of neck of right femur, subsequent encounter for closed fracture with routine healing: Secondary | ICD-10-CM | POA: Diagnosis not present

## 2022-06-24 DIAGNOSIS — K219 Gastro-esophageal reflux disease without esophagitis: Secondary | ICD-10-CM | POA: Diagnosis not present

## 2022-06-24 DIAGNOSIS — Z87440 Personal history of urinary (tract) infections: Secondary | ICD-10-CM | POA: Diagnosis not present

## 2022-06-24 DIAGNOSIS — H409 Unspecified glaucoma: Secondary | ICD-10-CM | POA: Diagnosis not present

## 2022-06-24 DIAGNOSIS — D631 Anemia in chronic kidney disease: Secondary | ICD-10-CM | POA: Diagnosis not present

## 2022-06-24 DIAGNOSIS — Z96641 Presence of right artificial hip joint: Secondary | ICD-10-CM | POA: Diagnosis not present

## 2022-06-24 DIAGNOSIS — Z9181 History of falling: Secondary | ICD-10-CM | POA: Diagnosis not present

## 2022-06-24 DIAGNOSIS — I5032 Chronic diastolic (congestive) heart failure: Secondary | ICD-10-CM | POA: Diagnosis not present

## 2022-06-24 DIAGNOSIS — M810 Age-related osteoporosis without current pathological fracture: Secondary | ICD-10-CM | POA: Diagnosis not present

## 2022-06-24 DIAGNOSIS — I420 Dilated cardiomyopathy: Secondary | ICD-10-CM | POA: Diagnosis not present

## 2022-06-24 DIAGNOSIS — I130 Hypertensive heart and chronic kidney disease with heart failure and stage 1 through stage 4 chronic kidney disease, or unspecified chronic kidney disease: Secondary | ICD-10-CM | POA: Diagnosis not present

## 2022-06-24 DIAGNOSIS — Z8719 Personal history of other diseases of the digestive system: Secondary | ICD-10-CM | POA: Diagnosis not present

## 2022-06-24 DIAGNOSIS — Z87891 Personal history of nicotine dependence: Secondary | ICD-10-CM | POA: Diagnosis not present

## 2022-06-24 DIAGNOSIS — M47892 Other spondylosis, cervical region: Secondary | ICD-10-CM | POA: Diagnosis not present

## 2022-06-24 DIAGNOSIS — I480 Paroxysmal atrial fibrillation: Secondary | ICD-10-CM | POA: Diagnosis not present

## 2022-06-24 DIAGNOSIS — Z7951 Long term (current) use of inhaled steroids: Secondary | ICD-10-CM | POA: Diagnosis not present

## 2022-06-24 DIAGNOSIS — N132 Hydronephrosis with renal and ureteral calculous obstruction: Secondary | ICD-10-CM | POA: Diagnosis not present

## 2022-06-24 DIAGNOSIS — E785 Hyperlipidemia, unspecified: Secondary | ICD-10-CM | POA: Diagnosis not present

## 2022-06-24 DIAGNOSIS — N1831 Chronic kidney disease, stage 3a: Secondary | ICD-10-CM | POA: Diagnosis not present

## 2022-06-24 DIAGNOSIS — Z86711 Personal history of pulmonary embolism: Secondary | ICD-10-CM | POA: Diagnosis not present

## 2022-06-28 DIAGNOSIS — Z96641 Presence of right artificial hip joint: Secondary | ICD-10-CM | POA: Diagnosis not present

## 2022-06-28 DIAGNOSIS — Z9181 History of falling: Secondary | ICD-10-CM | POA: Diagnosis not present

## 2022-06-28 DIAGNOSIS — J449 Chronic obstructive pulmonary disease, unspecified: Secondary | ICD-10-CM | POA: Diagnosis not present

## 2022-06-28 DIAGNOSIS — N132 Hydronephrosis with renal and ureteral calculous obstruction: Secondary | ICD-10-CM | POA: Diagnosis not present

## 2022-06-28 DIAGNOSIS — Z8719 Personal history of other diseases of the digestive system: Secondary | ICD-10-CM | POA: Diagnosis not present

## 2022-06-28 DIAGNOSIS — M47892 Other spondylosis, cervical region: Secondary | ICD-10-CM | POA: Diagnosis not present

## 2022-06-28 DIAGNOSIS — Z87891 Personal history of nicotine dependence: Secondary | ICD-10-CM | POA: Diagnosis not present

## 2022-06-28 DIAGNOSIS — S72001D Fracture of unspecified part of neck of right femur, subsequent encounter for closed fracture with routine healing: Secondary | ICD-10-CM | POA: Diagnosis not present

## 2022-06-28 DIAGNOSIS — I480 Paroxysmal atrial fibrillation: Secondary | ICD-10-CM | POA: Diagnosis not present

## 2022-06-28 DIAGNOSIS — K219 Gastro-esophageal reflux disease without esophagitis: Secondary | ICD-10-CM | POA: Diagnosis not present

## 2022-06-28 DIAGNOSIS — N1831 Chronic kidney disease, stage 3a: Secondary | ICD-10-CM | POA: Diagnosis not present

## 2022-06-28 DIAGNOSIS — Z87440 Personal history of urinary (tract) infections: Secondary | ICD-10-CM | POA: Diagnosis not present

## 2022-06-28 DIAGNOSIS — H409 Unspecified glaucoma: Secondary | ICD-10-CM | POA: Diagnosis not present

## 2022-06-28 DIAGNOSIS — Z7951 Long term (current) use of inhaled steroids: Secondary | ICD-10-CM | POA: Diagnosis not present

## 2022-06-28 DIAGNOSIS — E785 Hyperlipidemia, unspecified: Secondary | ICD-10-CM | POA: Diagnosis not present

## 2022-06-28 DIAGNOSIS — M810 Age-related osteoporosis without current pathological fracture: Secondary | ICD-10-CM | POA: Diagnosis not present

## 2022-06-28 DIAGNOSIS — Z86711 Personal history of pulmonary embolism: Secondary | ICD-10-CM | POA: Diagnosis not present

## 2022-06-28 DIAGNOSIS — D631 Anemia in chronic kidney disease: Secondary | ICD-10-CM | POA: Diagnosis not present

## 2022-06-28 DIAGNOSIS — I420 Dilated cardiomyopathy: Secondary | ICD-10-CM | POA: Diagnosis not present

## 2022-06-28 DIAGNOSIS — I5032 Chronic diastolic (congestive) heart failure: Secondary | ICD-10-CM | POA: Diagnosis not present

## 2022-06-28 DIAGNOSIS — I130 Hypertensive heart and chronic kidney disease with heart failure and stage 1 through stage 4 chronic kidney disease, or unspecified chronic kidney disease: Secondary | ICD-10-CM | POA: Diagnosis not present

## 2022-07-01 ENCOUNTER — Ambulatory Visit (INDEPENDENT_AMBULATORY_CARE_PROVIDER_SITE_OTHER): Payer: Medicare Other

## 2022-07-01 DIAGNOSIS — E538 Deficiency of other specified B group vitamins: Secondary | ICD-10-CM | POA: Diagnosis not present

## 2022-07-01 MED ORDER — CYANOCOBALAMIN 1000 MCG/ML IJ SOLN
1000.0000 ug | Freq: Once | INTRAMUSCULAR | Status: AC
  Administered 2022-07-01: 1000 ug via INTRAMUSCULAR

## 2022-07-01 NOTE — Progress Notes (Signed)
Patient arrived for his B12 injection. Patient was administered a B12 injection into his right deltoid. Patient tolerated the injection well and did not show any signs of distress or voice any concerns.

## 2022-07-06 DIAGNOSIS — Z87891 Personal history of nicotine dependence: Secondary | ICD-10-CM | POA: Diagnosis not present

## 2022-07-06 DIAGNOSIS — I5032 Chronic diastolic (congestive) heart failure: Secondary | ICD-10-CM | POA: Diagnosis not present

## 2022-07-06 DIAGNOSIS — E785 Hyperlipidemia, unspecified: Secondary | ICD-10-CM | POA: Diagnosis not present

## 2022-07-06 DIAGNOSIS — N132 Hydronephrosis with renal and ureteral calculous obstruction: Secondary | ICD-10-CM | POA: Diagnosis not present

## 2022-07-06 DIAGNOSIS — M47892 Other spondylosis, cervical region: Secondary | ICD-10-CM | POA: Diagnosis not present

## 2022-07-06 DIAGNOSIS — M810 Age-related osteoporosis without current pathological fracture: Secondary | ICD-10-CM | POA: Diagnosis not present

## 2022-07-06 DIAGNOSIS — I480 Paroxysmal atrial fibrillation: Secondary | ICD-10-CM | POA: Diagnosis not present

## 2022-07-06 DIAGNOSIS — H409 Unspecified glaucoma: Secondary | ICD-10-CM | POA: Diagnosis not present

## 2022-07-06 DIAGNOSIS — Z87440 Personal history of urinary (tract) infections: Secondary | ICD-10-CM | POA: Diagnosis not present

## 2022-07-06 DIAGNOSIS — Z96641 Presence of right artificial hip joint: Secondary | ICD-10-CM | POA: Diagnosis not present

## 2022-07-06 DIAGNOSIS — Z7951 Long term (current) use of inhaled steroids: Secondary | ICD-10-CM | POA: Diagnosis not present

## 2022-07-06 DIAGNOSIS — I130 Hypertensive heart and chronic kidney disease with heart failure and stage 1 through stage 4 chronic kidney disease, or unspecified chronic kidney disease: Secondary | ICD-10-CM | POA: Diagnosis not present

## 2022-07-06 DIAGNOSIS — K219 Gastro-esophageal reflux disease without esophagitis: Secondary | ICD-10-CM | POA: Diagnosis not present

## 2022-07-06 DIAGNOSIS — Z86711 Personal history of pulmonary embolism: Secondary | ICD-10-CM | POA: Diagnosis not present

## 2022-07-06 DIAGNOSIS — D631 Anemia in chronic kidney disease: Secondary | ICD-10-CM | POA: Diagnosis not present

## 2022-07-06 DIAGNOSIS — N1831 Chronic kidney disease, stage 3a: Secondary | ICD-10-CM | POA: Diagnosis not present

## 2022-07-06 DIAGNOSIS — S72001D Fracture of unspecified part of neck of right femur, subsequent encounter for closed fracture with routine healing: Secondary | ICD-10-CM | POA: Diagnosis not present

## 2022-07-06 DIAGNOSIS — Z9181 History of falling: Secondary | ICD-10-CM | POA: Diagnosis not present

## 2022-07-06 DIAGNOSIS — I420 Dilated cardiomyopathy: Secondary | ICD-10-CM | POA: Diagnosis not present

## 2022-07-06 DIAGNOSIS — Z8719 Personal history of other diseases of the digestive system: Secondary | ICD-10-CM | POA: Diagnosis not present

## 2022-07-06 DIAGNOSIS — J449 Chronic obstructive pulmonary disease, unspecified: Secondary | ICD-10-CM | POA: Diagnosis not present

## 2022-07-12 ENCOUNTER — Ambulatory Visit
Admission: RE | Admit: 2022-07-12 | Discharge: 2022-07-12 | Disposition: A | Discharge status: Home / Self Care | Payer: Medicare Other | Source: Ambulatory Visit | Attending: Radiation Oncology | Admitting: Radiation Oncology

## 2022-07-12 DIAGNOSIS — R911 Solitary pulmonary nodule: Secondary | ICD-10-CM | POA: Diagnosis not present

## 2022-07-12 DIAGNOSIS — J9811 Atelectasis: Secondary | ICD-10-CM | POA: Diagnosis not present

## 2022-07-12 DIAGNOSIS — J90 Pleural effusion, not elsewhere classified: Secondary | ICD-10-CM | POA: Diagnosis not present

## 2022-07-12 MED ORDER — IOHEXOL 300 MG/ML  SOLN
60.0000 mL | Freq: Once | INTRAMUSCULAR | Status: AC | PRN
  Administered 2022-07-12: 60 mL via INTRAVENOUS

## 2022-07-13 DIAGNOSIS — N132 Hydronephrosis with renal and ureteral calculous obstruction: Secondary | ICD-10-CM | POA: Diagnosis not present

## 2022-07-13 DIAGNOSIS — Z87440 Personal history of urinary (tract) infections: Secondary | ICD-10-CM | POA: Diagnosis not present

## 2022-07-13 DIAGNOSIS — J449 Chronic obstructive pulmonary disease, unspecified: Secondary | ICD-10-CM | POA: Diagnosis not present

## 2022-07-13 DIAGNOSIS — I130 Hypertensive heart and chronic kidney disease with heart failure and stage 1 through stage 4 chronic kidney disease, or unspecified chronic kidney disease: Secondary | ICD-10-CM | POA: Diagnosis not present

## 2022-07-13 DIAGNOSIS — N1831 Chronic kidney disease, stage 3a: Secondary | ICD-10-CM | POA: Diagnosis not present

## 2022-07-13 DIAGNOSIS — Z9181 History of falling: Secondary | ICD-10-CM | POA: Diagnosis not present

## 2022-07-13 DIAGNOSIS — Z7951 Long term (current) use of inhaled steroids: Secondary | ICD-10-CM | POA: Diagnosis not present

## 2022-07-13 DIAGNOSIS — Z96641 Presence of right artificial hip joint: Secondary | ICD-10-CM | POA: Diagnosis not present

## 2022-07-13 DIAGNOSIS — I480 Paroxysmal atrial fibrillation: Secondary | ICD-10-CM | POA: Diagnosis not present

## 2022-07-13 DIAGNOSIS — H409 Unspecified glaucoma: Secondary | ICD-10-CM | POA: Diagnosis not present

## 2022-07-13 DIAGNOSIS — Z86711 Personal history of pulmonary embolism: Secondary | ICD-10-CM | POA: Diagnosis not present

## 2022-07-13 DIAGNOSIS — I5032 Chronic diastolic (congestive) heart failure: Secondary | ICD-10-CM | POA: Diagnosis not present

## 2022-07-13 DIAGNOSIS — Z8719 Personal history of other diseases of the digestive system: Secondary | ICD-10-CM | POA: Diagnosis not present

## 2022-07-13 DIAGNOSIS — M47892 Other spondylosis, cervical region: Secondary | ICD-10-CM | POA: Diagnosis not present

## 2022-07-13 DIAGNOSIS — E785 Hyperlipidemia, unspecified: Secondary | ICD-10-CM | POA: Diagnosis not present

## 2022-07-13 DIAGNOSIS — K219 Gastro-esophageal reflux disease without esophagitis: Secondary | ICD-10-CM | POA: Diagnosis not present

## 2022-07-13 DIAGNOSIS — D631 Anemia in chronic kidney disease: Secondary | ICD-10-CM | POA: Diagnosis not present

## 2022-07-13 DIAGNOSIS — M810 Age-related osteoporosis without current pathological fracture: Secondary | ICD-10-CM | POA: Diagnosis not present

## 2022-07-13 DIAGNOSIS — S72001D Fracture of unspecified part of neck of right femur, subsequent encounter for closed fracture with routine healing: Secondary | ICD-10-CM | POA: Diagnosis not present

## 2022-07-13 DIAGNOSIS — Z87891 Personal history of nicotine dependence: Secondary | ICD-10-CM | POA: Diagnosis not present

## 2022-07-13 DIAGNOSIS — I420 Dilated cardiomyopathy: Secondary | ICD-10-CM | POA: Diagnosis not present

## 2022-07-14 DIAGNOSIS — S72001D Fracture of unspecified part of neck of right femur, subsequent encounter for closed fracture with routine healing: Secondary | ICD-10-CM | POA: Diagnosis not present

## 2022-07-14 DIAGNOSIS — I130 Hypertensive heart and chronic kidney disease with heart failure and stage 1 through stage 4 chronic kidney disease, or unspecified chronic kidney disease: Secondary | ICD-10-CM | POA: Diagnosis not present

## 2022-07-14 DIAGNOSIS — I480 Paroxysmal atrial fibrillation: Secondary | ICD-10-CM | POA: Diagnosis not present

## 2022-07-14 DIAGNOSIS — N1831 Chronic kidney disease, stage 3a: Secondary | ICD-10-CM | POA: Diagnosis not present

## 2022-07-14 DIAGNOSIS — I5032 Chronic diastolic (congestive) heart failure: Secondary | ICD-10-CM | POA: Diagnosis not present

## 2022-07-14 DIAGNOSIS — N1832 Chronic kidney disease, stage 3b: Secondary | ICD-10-CM | POA: Diagnosis not present

## 2022-07-14 DIAGNOSIS — J449 Chronic obstructive pulmonary disease, unspecified: Secondary | ICD-10-CM | POA: Diagnosis not present

## 2022-07-15 ENCOUNTER — Telehealth: Payer: Self-pay

## 2022-07-15 NOTE — Patient Instructions (Signed)
Visit Information  Thank you for taking time to visit with me today. Please don't hesitate to contact me if I can be of assistance to you.   Following are the goals we discussed today:  Use walker / wheelchair for ambulation and safety Reviewed education articles sent to you in the mail on Fall precautions.  Notify provider of any falls   Our next appointment is in person at Crecencio Mc, MD office on 08/13/22 at 11 am  Please call the care guide team at [phone] if you need to cancel or reschedule your appointment.   If you are experiencing a Mental Health or [place] or need someone to talk to, please call the Suicide and Crisis Lifeline: 988 call 1-800-273-TALK (toll free, 24 hour hotline)  The patient verbalized understanding of instructions, educational materials, and care plan provided today and agreed to receive a mailed copy of patient instructions, educational materials, and care plan.   Quinn Plowman RN,BSN,CCM [place] [facility] [phone] direct line  Preventing Falls and Fractures  Falls can be very serious, especially for older adults or people with osteoporosis  Falls can be caused by: Tripping or slipping Slow reflexes Balance problems Reduced muscle strength Poor vision or a recent change in prescription Illness and some medications (especially blood pressure pills, diuretics, heart medicines, muscle relaxants and sleep medications) Drinking alcohol  To prevent falls outdoors: Use a can or walker if needed Wear rubber-soled shoes so you don't slip DO NOT buy "shape up" shoes with rocker bottom soles if you have balance problems.  The thick soles and shape make it more difficult to keep your balance. Put kitty litter or salt on icy sidewalks Walk on the grass if the sidewalks are slick Avoid walking on uneven ground whenever possible  T prevent falls indoors: Keep rooms clutter-free, especially hallways, stairs and paths to light  switches Remove throw rugs Install night lights, especially to and in the bathroom Turn on lights before going downstairs Keep a flashlight next to your bed Buy a cordless phone to keep with you instead of jumping up to answer the phone Install grab bars in the bathroom near the shower and toilet Install rails on both sides of the stairs.  Make sure the stairs are well lit Wear slippers with non-skid soles.  Do not walk around in stockings or socks  Balance problems and dizziness are not a normal part of growing older.  If you begin having balance problems or dizziness see your doctor.  Physical Therapy can help you with many balance problems, strengthening hip and leg muscles and with gait training.  To keep your bones healthy make sure you are getting enough calcium and Vitamin D each day.  Ask your doctor or pharmacist about supplements.  Regular weight-bearing exercise like walking, lifting weights or dancing can help strengthen bones and prevent osteoporosis.

## 2022-07-15 NOTE — Patient Outreach (Signed)
  Care Coordination   Initial Visit Note   07/15/2022 Name: KLARK CLERC MRN: [id number] DOB: [birth year]  Olive Bass Schild is a 89 y.o. year old male who sees Derrel Nip, Aris Everts, MD for primary care. I spoke with  Olive Bass Sibley by phone today.  What matters to the patients health and wellness today?  Patient reports sustaining right hip fracture from a fall in November 2023. He reports having SNF rehab after surgical repair of hip.  He states his home health PT will be discontinued next week. Patient states he is doing ok but progress is slow.  Patient states he uses a walker and wheelchair for ambulation.  He also reports having macular degeneration and receiving periodic injections for this. Patient states he is also being treated for prostate cancer and receives treatment every six months. He states he is doing well with this and not having any complications.  Patient states he son lives near him and stays with him at nights. He reports his son arranges his medications.     Goals Addressed             This Visit's Progress    Health management/ education related to fall prevention/ macular degeneration       Interventions Today    Flowsheet Row Most Recent Value  Chronic Disease   Chronic disease during today's visit Other  [status post arthroplasty right hip, macular degeneration]  General Interventions   General Interventions Discussed/Reviewed General Interventions Discussed, Doctor Visits  [evaluation of current treatment plan related to status post right hip arthroplasty and macular degeneration and patients adherence to plan as establishe by provider. Assessed for falls]  Doctor Visits Discussed/Reviewed Doctor Visits Discussed  [Reviewed scheduled/ upcoming provider visits. Assessed for regular eye exams]  Education Interventions   Education Provided Provided Printed Education  [education articles on fall prevention mailed to patient.]  Pharmacy Interventions   Pharmacy  Dicussed/Reviewed Pharmacy Topics Reviewed  [medications reviewed and compliance discusse/ encourged. Assessed for caregiver support regarding dispensing of medications.]  Safety Interventions   Safety Discussed/Reviewed Fall Risk  [fall prevention discussed.  Encouraged patient to use ambulatory devices as recommended.  Advised to contiue PT/OT instructed exercises at home.]              SDOH assessments and interventions completed:  Yes  SDOH Interventions Today    Flowsheet Row Most Recent Value  SDOH Interventions   Food Insecurity Interventions Intervention Not Indicated  Housing Interventions Intervention Not Indicated  Transportation Interventions Intervention Not Indicated        Care Coordination Interventions:  Yes, provided   Follow up plan: Follow up call scheduled for 08/13/22     Encounter Outcome:  Pt. Visit Completed   Quinn Plowman RN,BSN,CCM [place] [phone] direct line

## 2022-07-20 DIAGNOSIS — Z7951 Long term (current) use of inhaled steroids: Secondary | ICD-10-CM | POA: Diagnosis not present

## 2022-07-20 DIAGNOSIS — Z87440 Personal history of urinary (tract) infections: Secondary | ICD-10-CM | POA: Diagnosis not present

## 2022-07-20 DIAGNOSIS — N132 Hydronephrosis with renal and ureteral calculous obstruction: Secondary | ICD-10-CM | POA: Diagnosis not present

## 2022-07-20 DIAGNOSIS — D631 Anemia in chronic kidney disease: Secondary | ICD-10-CM | POA: Diagnosis not present

## 2022-07-20 DIAGNOSIS — H409 Unspecified glaucoma: Secondary | ICD-10-CM | POA: Diagnosis not present

## 2022-07-20 DIAGNOSIS — Z87891 Personal history of nicotine dependence: Secondary | ICD-10-CM | POA: Diagnosis not present

## 2022-07-20 DIAGNOSIS — M47892 Other spondylosis, cervical region: Secondary | ICD-10-CM | POA: Diagnosis not present

## 2022-07-20 DIAGNOSIS — I130 Hypertensive heart and chronic kidney disease with heart failure and stage 1 through stage 4 chronic kidney disease, or unspecified chronic kidney disease: Secondary | ICD-10-CM | POA: Diagnosis not present

## 2022-07-20 DIAGNOSIS — J449 Chronic obstructive pulmonary disease, unspecified: Secondary | ICD-10-CM | POA: Diagnosis not present

## 2022-07-20 DIAGNOSIS — E785 Hyperlipidemia, unspecified: Secondary | ICD-10-CM | POA: Diagnosis not present

## 2022-07-20 DIAGNOSIS — I480 Paroxysmal atrial fibrillation: Secondary | ICD-10-CM | POA: Diagnosis not present

## 2022-07-20 DIAGNOSIS — S72001D Fracture of unspecified part of neck of right femur, subsequent encounter for closed fracture with routine healing: Secondary | ICD-10-CM | POA: Diagnosis not present

## 2022-07-20 DIAGNOSIS — Z8719 Personal history of other diseases of the digestive system: Secondary | ICD-10-CM | POA: Diagnosis not present

## 2022-07-20 DIAGNOSIS — I420 Dilated cardiomyopathy: Secondary | ICD-10-CM | POA: Diagnosis not present

## 2022-07-20 DIAGNOSIS — Z86711 Personal history of pulmonary embolism: Secondary | ICD-10-CM | POA: Diagnosis not present

## 2022-07-20 DIAGNOSIS — Z9181 History of falling: Secondary | ICD-10-CM | POA: Diagnosis not present

## 2022-07-20 DIAGNOSIS — M810 Age-related osteoporosis without current pathological fracture: Secondary | ICD-10-CM | POA: Diagnosis not present

## 2022-07-20 DIAGNOSIS — I5032 Chronic diastolic (congestive) heart failure: Secondary | ICD-10-CM | POA: Diagnosis not present

## 2022-07-20 DIAGNOSIS — K219 Gastro-esophageal reflux disease without esophagitis: Secondary | ICD-10-CM | POA: Diagnosis not present

## 2022-07-20 DIAGNOSIS — Z96641 Presence of right artificial hip joint: Secondary | ICD-10-CM | POA: Diagnosis not present

## 2022-07-20 DIAGNOSIS — N1831 Chronic kidney disease, stage 3a: Secondary | ICD-10-CM | POA: Diagnosis not present

## 2022-07-21 ENCOUNTER — Inpatient Hospital Stay: Payer: Medicare Other | Attending: Oncology | Admitting: Oncology

## 2022-07-21 ENCOUNTER — Ambulatory Visit
Admission: RE | Admit: 2022-07-21 | Discharge: 2022-07-21 | Disposition: A | Discharge status: Home / Self Care | Payer: Medicare Other | Source: Ambulatory Visit | Attending: Radiation Oncology | Admitting: Radiation Oncology

## 2022-07-21 ENCOUNTER — Other Ambulatory Visit: Payer: Self-pay | Admitting: *Deleted

## 2022-07-21 ENCOUNTER — Encounter: Payer: Self-pay | Admitting: Oncology

## 2022-07-21 ENCOUNTER — Encounter: Payer: Self-pay | Admitting: Radiation Oncology

## 2022-07-21 VITALS — BP 122/80 | HR 80 | Temp 98.5°F | Resp 16 | Wt 162.8 lb

## 2022-07-21 VITALS — BP 122/80 | HR 80 | Temp 98.5°F | Resp 16 | Ht 70.0 in | Wt 162.0 lb

## 2022-07-21 DIAGNOSIS — Z87891 Personal history of nicotine dependence: Secondary | ICD-10-CM | POA: Diagnosis not present

## 2022-07-21 DIAGNOSIS — I7121 Aneurysm of the ascending aorta, without rupture: Secondary | ICD-10-CM | POA: Insufficient documentation

## 2022-07-21 DIAGNOSIS — Z923 Personal history of irradiation: Secondary | ICD-10-CM | POA: Insufficient documentation

## 2022-07-21 DIAGNOSIS — R918 Other nonspecific abnormal finding of lung field: Secondary | ICD-10-CM | POA: Insufficient documentation

## 2022-07-21 DIAGNOSIS — I10 Essential (primary) hypertension: Secondary | ICD-10-CM | POA: Diagnosis not present

## 2022-07-21 DIAGNOSIS — R911 Solitary pulmonary nodule: Secondary | ICD-10-CM

## 2022-07-21 DIAGNOSIS — C3411 Malignant neoplasm of upper lobe, right bronchus or lung: Secondary | ICD-10-CM

## 2022-07-21 DIAGNOSIS — Z79899 Other long term (current) drug therapy: Secondary | ICD-10-CM | POA: Insufficient documentation

## 2022-07-21 NOTE — Progress Notes (Signed)
[place]  [phone] [phone]) [phone]  ID: Eric Hicks OB: Dec 14, 1926  MR#: [id number]  [id number]  Patient Care Team: Crecencio Mc, MD as PCP - General (Internal Medicine) Telford Nab, RN as Oncology Nurse Navigator  CHIEF COMPLAINT: Right upper lobe lung mass.  INTERVAL HISTORY: Patient returns to clinic today for routine 40-month evaluation and discussion of his imaging results.  He continues to feel well and remains asymptomatic.  He remains active and lives independently.  He has no neurologic complaints.  He denies any recent fevers or illnesses.  He has a good appetite and denies weight loss.  He has no chest pain, shortness of breath, cough, or hemoptysis.  He denies any nausea, vomiting, constipation, or diarrhea.  He has no urinary complaints.  Patient offers no specific complaints today.  REVIEW OF SYSTEMS:   Review of Systems  Constitutional: Negative.  Negative for fever, malaise/fatigue and weight loss.  Respiratory: Negative.  Negative for cough, hemoptysis and shortness of breath.   Cardiovascular: Negative.  Negative for chest pain and leg swelling.  Gastrointestinal: Negative.  Negative for abdominal pain.  Genitourinary: Negative.  Negative for dysuria.  Musculoskeletal: Negative.  Negative for back pain.  Skin: Negative.  Negative for rash.  Neurological: Negative.  Negative for dizziness, focal weakness, weakness and headaches.  Psychiatric/Behavioral: Negative.  The patient is not nervous/anxious.     As per HPI. Otherwise, a complete review of systems is negative.  PAST MEDICAL HISTORY: Past Medical History:  Diagnosis Date   BPH (benign prostatic hyperplasia)    managed by Olena Heckle   COPD (chronic obstructive pulmonary disease) ([place])    Elevated PSA, between 10 and less than 20 ng/ml    Olena Heckle, watchful waiting   Hypertension    Osteoporosis    by DEXA    PAST SURGICAL HISTORY: Past Surgical History:   Procedure Laterality Date   HIP ARTHROPLASTY Right 03/24/2022   Procedure: ARTHROPLASTY BIPOLAR HIP (HEMIARTHROPLASTY);  Surgeon: Steffanie Rainwater, MD;  Location: [facility] ORS;  Service: Orthopedics;  Laterality: Right;   TYMPANOSTOMY TUBE PLACEMENT  2017    FAMILY HISTORY: Family History  Problem Relation Age of Onset   Diabetes Mother    Hypertension Father    Cancer Brother 41       multiple myeloma    ADVANCED DIRECTIVES (Y/N):  N  HEALTH MAINTENANCE: Social History   Tobacco Use   Smoking status: Former   Smokeless tobacco: Never  Scientific laboratory technician Use: Never used  Substance Use Topics   Alcohol use: No     Colonoscopy:  PAP:  Bone density:  Lipid panel:  Allergies  Allergen Reactions   Tylenol [Acetaminophen] Rash    Current Outpatient Medications  Medication Sig Dispense Refill   ADVAIR DISKUS 100-50 MCG/ACT AEPB INHALE 1 PUFF INTO THE LUNGS TWICE A DAY 180 each 1   Aflibercept (EYLEA IO) Inject into the eye.     AMBULATORY NON FORMULARY MEDICATION Joint Advantage Gold 2 tablets daily     atorvastatin (LIPITOR) 20 MG tablet TAKE 1 TABLET BY MOUTH EVERY OTHER DAY 45 tablet 3   calcium carbonate (OS-CAL) 600 MG TABS Take 600 mg by mouth daily with breakfast.      carvedilol (COREG) 6.25 MG tablet Take 6.25 mg by mouth 2 (two) times daily.     dorzolamide-timolol (COSOPT) 22.3-6.8 MG/ML ophthalmic solution TAKE 1 DROP(S) IN RIGHT EYE 2 TIMES A DAY  5   doxazosin (CARDURA) 1  MG tablet TAKE 1 TABLET BY MOUTH EVERY DAY 90 tablet 1   ferrous sulfate 325 (65 FE) MG tablet Take 1 tablet by mouth daily.     furosemide (LASIX) 20 MG tablet TAKE 2 TABLETS BY MOUTH DAILY AS NEEDED (Patient taking differently: Take 40 mg by mouth daily.) 180 tablet 1   latanoprost (XALATAN) 0.005 % ophthalmic solution Place 1 drop into both eyes at bedtime.  5   leuprolide, 6 Month, (ELIGARD) 45 MG injection Inject 45 mg into the skin every 6 (six) months.     metolazone (ZAROXOLYN) 2.5  MG tablet Take 1 tablet (2.5 mg total) by mouth daily. 30 minutes prior to furosemide 90 tablet 1   omeprazole (PRILOSEC) 40 MG capsule Take 1 capsule (40 mg total) by mouth daily. 30 capsule 3   senna-docusate (SENOKOT-S) 8.6-50 MG tablet Take 1 tablet by mouth at bedtime as needed for mild constipation.     traMADol (ULTRAM) 50 MG tablet Take 1 tablet (50 mg total) by mouth every 6 (six) hours as needed for moderate pain. 30 tablet 0   Vitamin D, Cholecalciferol, 25 MCG (1000 UT) CAPS Take 1 capsule by mouth daily.     No current facility-administered medications for this visit.    OBJECTIVE: Vitals:   07/21/22 1110  BP: 122/80  Pulse: 80  Resp: 16  Temp: 98.5 F (36.9 C)  SpO2: 97%     Body mass index is 23.24 kg/m.    ECOG FS:0 - Asymptomatic  General: Well-developed, well-nourished, no acute distress. Eyes: Pink conjunctiva, anicteric sclera. HEENT: Normocephalic, moist mucous membranes. Lungs: No audible wheezing or coughing. Heart: Regular rate and rhythm. Abdomen: Soft, nontender, no obvious distention. Musculoskeletal: No edema, cyanosis, or clubbing. Neuro: Alert, answering all questions appropriately. Cranial nerves grossly intact. Skin: No rashes or petechiae noted. Psych: Normal affect.  LAB RESULTS:  Lab Results  Component Value Date   NA 144 05/05/2022   K 3.7 05/05/2022   CL 104 05/05/2022   CO2 33 (H) 05/05/2022   GLUCOSE 83 05/05/2022   BUN 19 05/05/2022   CREATININE 1.50 05/05/2022   CALCIUM 8.4 05/05/2022   PROT 6.1 (L) 03/23/2022   ALBUMIN 2.5 (L) 03/28/2022   AST 25 03/23/2022   ALT 16 03/23/2022   ALKPHOS 68 03/23/2022   BILITOT 1.0 03/23/2022   GFRNONAA 50 (L) 03/28/2022   GFRAA 40 (L) 11/12/2011    Lab Results  Component Value Date   WBC 6.0 05/05/2022   NEUTROABS 3.7 05/05/2022   HGB 9.7 (L) 05/05/2022   HCT 29.4 (L) 05/05/2022   MCV 98.9 05/05/2022   PLT 206.0 05/05/2022     STUDIES: CT Chest W Contrast  Result Date:  07/13/2022 CLINICAL DATA:  89 year old male presenting for evaluation non-small cell lung carcinoma. Follow-up assessment. * Tracking Code: BO * EXAM: CT CHEST WITH CONTRAST TECHNIQUE: Multidetector CT imaging of the chest was performed during intravenous contrast administration. RADIATION DOSE REDUCTION: This exam was performed according to the departmental dose-optimization program which includes automated exposure control, adjustment of the mA and/or kV according to patient size and/or use of iterative reconstruction technique. CONTRAST:  50mL OMNIPAQUE IOHEXOL 300 MG/ML  SOLN COMPARISON:  Multiple prior studies most recent comparison January 12, 2022. FINDINGS: Cardiovascular: 0.6 4.7 cm ascending thoracic aortic dilation is similar to previous imaging. There is calcified and noncalcified aortic atherosclerotic plaque. Heart size remains enlarged without pericardial effusion. Signs of coronary artery disease. Central pulmonary vasculature unremarkable on venous phase. Mediastinum/Nodes:  No thoracic inlet, axillary, mediastinal or hilar adenopathy. Esophagus grossly normal. Lungs/Pleura: Volume loss in the RIGHT upper lobe similar to previous imaging. Compatible with post treatment changes. Small RIGHT-sided pleural effusion is diminished. There is basilar atelectasis. Airways are patent. Signs of prior granulomatous disease as before. Upper Abdomen: Incidental imaging of upper abdominal contents without acute process. Nephrolithiasis and renal cortical scarring without change in the upper pole of LEFT and RIGHT kidney. Musculoskeletal: No acute bone finding. No destructive bone process. Spinal degenerative changes. IMPRESSION: 1. Volume loss in the RIGHT upper lobe similar to previous imaging. Compatible with post treatment changes. 2. Resolution of nodular changes in the anterior RIGHT upper lobe compatible with resolution of post infectious or inflammatory changes. 3. Small RIGHT-sided pleural effusion is  diminished. 4. Aortic atherosclerosis and coronary artery disease. 5. Bilateral nephrolithiasis and renal cortical scarring. 6. 4.7 cm ascending thoracic aortic aneurysm. Ascending thoracic aortic aneurysm. Recommend semi-annual imaging followup by CTA or MRA and referral to cardiothoracic surgery if not already obtained. This recommendation follows 2010 ACCF/AHA/AATS/ACR/ASA/SCA/SCAI/SIR/STS/SVM Guidelines for the Diagnosis and Management of Patients With Thoracic Aortic Disease. Circulation. 2010; [id number]. Aortic aneurysm NOS (ICD10-I71.9) Aortic Atherosclerosis (ICD10-I70.0). Electronically Signed   By: Zetta Bills M.D.   On: 07/13/2022 13:38     ASSESSMENT: Right upper lobe lung mass.  PLAN:    Right upper lobe lung mass: CT scan results from January 09, 2021 reviewed independently with essentially unchanged lesion of right upper lobe, although the lesion had been noted to increase in size since 2020.  Suspicion was a low-grade indolent bronchogenic carcinoma and patient elected to forego biopsy and undergo radiation treatments which completed in approximately May 2023.  His most recent CT scan on July 12, 2022 reviewed independently and reported as above with no obvious evidence of recurrent or progressive disease.  Continue imaging every 6 months until patient is 2 years removed from his XRT in May 2025 at which point he can be transition to yearly imaging.  Return to clinic in 6 months with repeat imaging and further evaluation.   Hypertension: Patient's blood pressure is within normal limits today.  Continue monitoring and treatment per primary care.   I spent a total of 20 minutes reviewing chart data, face-to-face evaluation with the patient, counseling and coordination of care as detailed above.    Patient expressed understanding and was in agreement with this plan. He also understands that He can call clinic at any time with any questions, concerns, or complaints.    Cancer  Staging  No matching staging information was found for the patient.  Lloyd Huger, MD   07/21/2022 12:18 PM

## 2022-07-21 NOTE — Progress Notes (Signed)
Radiation Oncology Follow up Note  Name: Eric Hicks   Date:   07/21/2022 MRN:  [id number] DOB: 1926-12-02    This 89 y.o. male presents to the clinic today for 71-month follow-up status post SBRT to his right upper lobe for stage I non-small cell lung cancer.  REFERRING PROVIDER: Crecencio Mc, MD  HPI: Patient is a 89 year old male now out 10 months having completed SBRT to his right upper lobe for a stage I non-small cell lung cancer.  Seen today in routine follow-up he is doing well.  He specifically [place] cough hemoptysis chest tightness or any change in his pulmonary status.  He had a recent CT scan.  Showing volume loss in the right upper lobe compatible with posttreatment changes.  He had some nodular changes in the anterior right upper lobe with resolution now most likely reflecting a postinfectious process.  He does have a known 4.7 cm ascending thoracic aneurysm of which vascular surgery has been aware and following.  COMPLICATIONS OF TREATMENT: none  FOLLOW UP COMPLIANCE: keeps appointments   PHYSICAL EXAM:  BP 122/80   Pulse 80   Temp 98.5 F (36.9 C)   Resp 16   Wt 162 lb 12.8 oz (73.8 kg)   BMI 23.36 kg/m  Well-developed well-nourished patient in NAD. HEENT reveals PERLA, EOMI, discs not visualized.  Oral cavity is clear. No oral mucosal lesions are identified. Neck is clear without evidence of cervical or supraclavicular adenopathy. Lungs are clear to A&P. Cardiac examination is essentially unremarkable with regular rate and rhythm without murmur rub or thrill. Abdomen is benign with no organomegaly or masses noted. Motor sensory and DTR levels are equal and symmetric in the upper and lower extremities. Cranial nerves II through XII are grossly intact. Proprioception is intact. No peripheral adenopathy or edema is identified. No motor or sensory levels are noted. Crude visual fields are within normal range.  RADIOLOGY RESULTS: CT scans reviewed compatible with  above-stated findings  PLAN: The present time patient is doing well with stable chest after 10 months of SBRT.  Pleased with his overall progress.  Of asked to see him back in 1 year for follow-up with repeat CT scan at that time.  Patient is to call with any concerns.  I would like to take this opportunity to thank you for allowing me to participate in the care of your patient.Noreene Filbert, MD

## 2022-07-26 DIAGNOSIS — N1831 Chronic kidney disease, stage 3a: Secondary | ICD-10-CM | POA: Diagnosis not present

## 2022-07-26 DIAGNOSIS — Z87440 Personal history of urinary (tract) infections: Secondary | ICD-10-CM | POA: Diagnosis not present

## 2022-07-26 DIAGNOSIS — I5032 Chronic diastolic (congestive) heart failure: Secondary | ICD-10-CM | POA: Diagnosis not present

## 2022-07-26 DIAGNOSIS — I480 Paroxysmal atrial fibrillation: Secondary | ICD-10-CM | POA: Diagnosis not present

## 2022-07-26 DIAGNOSIS — D631 Anemia in chronic kidney disease: Secondary | ICD-10-CM | POA: Diagnosis not present

## 2022-07-26 DIAGNOSIS — M47892 Other spondylosis, cervical region: Secondary | ICD-10-CM | POA: Diagnosis not present

## 2022-07-26 DIAGNOSIS — M810 Age-related osteoporosis without current pathological fracture: Secondary | ICD-10-CM | POA: Diagnosis not present

## 2022-07-26 DIAGNOSIS — I420 Dilated cardiomyopathy: Secondary | ICD-10-CM | POA: Diagnosis not present

## 2022-07-26 DIAGNOSIS — Z7951 Long term (current) use of inhaled steroids: Secondary | ICD-10-CM | POA: Diagnosis not present

## 2022-07-26 DIAGNOSIS — Z86711 Personal history of pulmonary embolism: Secondary | ICD-10-CM | POA: Diagnosis not present

## 2022-07-26 DIAGNOSIS — J449 Chronic obstructive pulmonary disease, unspecified: Secondary | ICD-10-CM | POA: Diagnosis not present

## 2022-07-26 DIAGNOSIS — H409 Unspecified glaucoma: Secondary | ICD-10-CM | POA: Diagnosis not present

## 2022-07-26 DIAGNOSIS — N132 Hydronephrosis with renal and ureteral calculous obstruction: Secondary | ICD-10-CM | POA: Diagnosis not present

## 2022-07-26 DIAGNOSIS — I130 Hypertensive heart and chronic kidney disease with heart failure and stage 1 through stage 4 chronic kidney disease, or unspecified chronic kidney disease: Secondary | ICD-10-CM | POA: Diagnosis not present

## 2022-07-26 DIAGNOSIS — K219 Gastro-esophageal reflux disease without esophagitis: Secondary | ICD-10-CM | POA: Diagnosis not present

## 2022-07-26 DIAGNOSIS — Z8719 Personal history of other diseases of the digestive system: Secondary | ICD-10-CM | POA: Diagnosis not present

## 2022-07-26 DIAGNOSIS — E785 Hyperlipidemia, unspecified: Secondary | ICD-10-CM | POA: Diagnosis not present

## 2022-07-26 DIAGNOSIS — Z96641 Presence of right artificial hip joint: Secondary | ICD-10-CM | POA: Diagnosis not present

## 2022-07-26 DIAGNOSIS — Z9181 History of falling: Secondary | ICD-10-CM | POA: Diagnosis not present

## 2022-07-26 DIAGNOSIS — Z87891 Personal history of nicotine dependence: Secondary | ICD-10-CM | POA: Diagnosis not present

## 2022-07-26 DIAGNOSIS — S72001D Fracture of unspecified part of neck of right femur, subsequent encounter for closed fracture with routine healing: Secondary | ICD-10-CM | POA: Diagnosis not present

## 2022-07-28 ENCOUNTER — Other Ambulatory Visit: Payer: Self-pay | Admitting: Internal Medicine

## 2022-08-01 ENCOUNTER — Other Ambulatory Visit: Payer: Self-pay | Admitting: Internal Medicine

## 2022-08-02 ENCOUNTER — Ambulatory Visit (INDEPENDENT_AMBULATORY_CARE_PROVIDER_SITE_OTHER): Payer: Medicare Other

## 2022-08-02 DIAGNOSIS — E538 Deficiency of other specified B group vitamins: Secondary | ICD-10-CM | POA: Diagnosis not present

## 2022-08-02 MED ORDER — CYANOCOBALAMIN 1000 MCG/ML IJ SOLN
1000.0000 ug | Freq: Once | INTRAMUSCULAR | Status: AC
  Administered 2022-08-02: 1000 ug via INTRAMUSCULAR

## 2022-08-02 NOTE — Progress Notes (Signed)
Pt presented for their vitamin B12 injection. Pt was identified through two identifiers. Pt tolerated shot well in their left  deltoid.  

## 2022-08-04 DIAGNOSIS — I420 Dilated cardiomyopathy: Secondary | ICD-10-CM | POA: Diagnosis not present

## 2022-08-04 DIAGNOSIS — S72001D Fracture of unspecified part of neck of right femur, subsequent encounter for closed fracture with routine healing: Secondary | ICD-10-CM | POA: Diagnosis not present

## 2022-08-04 DIAGNOSIS — Z87891 Personal history of nicotine dependence: Secondary | ICD-10-CM | POA: Diagnosis not present

## 2022-08-04 DIAGNOSIS — M47892 Other spondylosis, cervical region: Secondary | ICD-10-CM | POA: Diagnosis not present

## 2022-08-04 DIAGNOSIS — Z96641 Presence of right artificial hip joint: Secondary | ICD-10-CM | POA: Diagnosis not present

## 2022-08-04 DIAGNOSIS — Z7951 Long term (current) use of inhaled steroids: Secondary | ICD-10-CM | POA: Diagnosis not present

## 2022-08-04 DIAGNOSIS — N1831 Chronic kidney disease, stage 3a: Secondary | ICD-10-CM | POA: Diagnosis not present

## 2022-08-04 DIAGNOSIS — J449 Chronic obstructive pulmonary disease, unspecified: Secondary | ICD-10-CM | POA: Diagnosis not present

## 2022-08-04 DIAGNOSIS — Z86711 Personal history of pulmonary embolism: Secondary | ICD-10-CM | POA: Diagnosis not present

## 2022-08-04 DIAGNOSIS — D631 Anemia in chronic kidney disease: Secondary | ICD-10-CM | POA: Diagnosis not present

## 2022-08-04 DIAGNOSIS — K219 Gastro-esophageal reflux disease without esophagitis: Secondary | ICD-10-CM | POA: Diagnosis not present

## 2022-08-04 DIAGNOSIS — Z8719 Personal history of other diseases of the digestive system: Secondary | ICD-10-CM | POA: Diagnosis not present

## 2022-08-04 DIAGNOSIS — N132 Hydronephrosis with renal and ureteral calculous obstruction: Secondary | ICD-10-CM | POA: Diagnosis not present

## 2022-08-04 DIAGNOSIS — I480 Paroxysmal atrial fibrillation: Secondary | ICD-10-CM | POA: Diagnosis not present

## 2022-08-04 DIAGNOSIS — Z9181 History of falling: Secondary | ICD-10-CM | POA: Diagnosis not present

## 2022-08-04 DIAGNOSIS — Z87440 Personal history of urinary (tract) infections: Secondary | ICD-10-CM | POA: Diagnosis not present

## 2022-08-04 DIAGNOSIS — I130 Hypertensive heart and chronic kidney disease with heart failure and stage 1 through stage 4 chronic kidney disease, or unspecified chronic kidney disease: Secondary | ICD-10-CM | POA: Diagnosis not present

## 2022-08-04 DIAGNOSIS — H409 Unspecified glaucoma: Secondary | ICD-10-CM | POA: Diagnosis not present

## 2022-08-04 DIAGNOSIS — E785 Hyperlipidemia, unspecified: Secondary | ICD-10-CM | POA: Diagnosis not present

## 2022-08-04 DIAGNOSIS — I5032 Chronic diastolic (congestive) heart failure: Secondary | ICD-10-CM | POA: Diagnosis not present

## 2022-08-04 DIAGNOSIS — M810 Age-related osteoporosis without current pathological fracture: Secondary | ICD-10-CM | POA: Diagnosis not present

## 2022-08-10 ENCOUNTER — Telehealth: Payer: Self-pay | Admitting: Internal Medicine

## 2022-08-10 DIAGNOSIS — N1831 Chronic kidney disease, stage 3a: Secondary | ICD-10-CM | POA: Diagnosis not present

## 2022-08-10 DIAGNOSIS — Z87440 Personal history of urinary (tract) infections: Secondary | ICD-10-CM | POA: Diagnosis not present

## 2022-08-10 DIAGNOSIS — H34812 Central retinal vein occlusion, left eye, with macular edema: Secondary | ICD-10-CM | POA: Diagnosis not present

## 2022-08-10 DIAGNOSIS — Z96641 Presence of right artificial hip joint: Secondary | ICD-10-CM | POA: Diagnosis not present

## 2022-08-10 DIAGNOSIS — S72001D Fracture of unspecified part of neck of right femur, subsequent encounter for closed fracture with routine healing: Secondary | ICD-10-CM | POA: Diagnosis not present

## 2022-08-10 DIAGNOSIS — N132 Hydronephrosis with renal and ureteral calculous obstruction: Secondary | ICD-10-CM | POA: Diagnosis not present

## 2022-08-10 DIAGNOSIS — H3092 Unspecified chorioretinal inflammation, left eye: Secondary | ICD-10-CM | POA: Diagnosis not present

## 2022-08-10 DIAGNOSIS — I480 Paroxysmal atrial fibrillation: Secondary | ICD-10-CM | POA: Diagnosis not present

## 2022-08-10 DIAGNOSIS — J449 Chronic obstructive pulmonary disease, unspecified: Secondary | ICD-10-CM | POA: Diagnosis not present

## 2022-08-10 DIAGNOSIS — I5032 Chronic diastolic (congestive) heart failure: Secondary | ICD-10-CM | POA: Diagnosis not present

## 2022-08-10 DIAGNOSIS — K219 Gastro-esophageal reflux disease without esophagitis: Secondary | ICD-10-CM | POA: Diagnosis not present

## 2022-08-10 DIAGNOSIS — I130 Hypertensive heart and chronic kidney disease with heart failure and stage 1 through stage 4 chronic kidney disease, or unspecified chronic kidney disease: Secondary | ICD-10-CM | POA: Diagnosis not present

## 2022-08-10 DIAGNOSIS — E785 Hyperlipidemia, unspecified: Secondary | ICD-10-CM | POA: Diagnosis not present

## 2022-08-10 DIAGNOSIS — D631 Anemia in chronic kidney disease: Secondary | ICD-10-CM | POA: Diagnosis not present

## 2022-08-10 DIAGNOSIS — M810 Age-related osteoporosis without current pathological fracture: Secondary | ICD-10-CM | POA: Diagnosis not present

## 2022-08-10 DIAGNOSIS — H401124 Primary open-angle glaucoma, left eye, indeterminate stage: Secondary | ICD-10-CM | POA: Diagnosis not present

## 2022-08-10 DIAGNOSIS — Z87891 Personal history of nicotine dependence: Secondary | ICD-10-CM | POA: Diagnosis not present

## 2022-08-10 DIAGNOSIS — Z86711 Personal history of pulmonary embolism: Secondary | ICD-10-CM | POA: Diagnosis not present

## 2022-08-10 DIAGNOSIS — I420 Dilated cardiomyopathy: Secondary | ICD-10-CM | POA: Diagnosis not present

## 2022-08-10 DIAGNOSIS — Z9181 History of falling: Secondary | ICD-10-CM | POA: Diagnosis not present

## 2022-08-10 DIAGNOSIS — H401112 Primary open-angle glaucoma, right eye, moderate stage: Secondary | ICD-10-CM | POA: Diagnosis not present

## 2022-08-10 DIAGNOSIS — H409 Unspecified glaucoma: Secondary | ICD-10-CM | POA: Diagnosis not present

## 2022-08-10 DIAGNOSIS — Z8719 Personal history of other diseases of the digestive system: Secondary | ICD-10-CM | POA: Diagnosis not present

## 2022-08-10 DIAGNOSIS — M47892 Other spondylosis, cervical region: Secondary | ICD-10-CM | POA: Diagnosis not present

## 2022-08-10 DIAGNOSIS — Z7951 Long term (current) use of inhaled steroids: Secondary | ICD-10-CM | POA: Diagnosis not present

## 2022-08-10 NOTE — Telephone Encounter (Signed)
Shawn from [facility] called stating pt fell yesterday in his driveway and has a big (half dollar sign) abraison on right side of forehead and multiple places on his nose that are cut almost like his nose drugged across the concrete. Ines Bloomer is concerned that the pt can barely move his neck to the left or right

## 2022-08-10 NOTE — Telephone Encounter (Signed)
LMTCB with Ines Bloomer

## 2022-08-12 DIAGNOSIS — H6983 Other specified disorders of Eustachian tube, bilateral: Secondary | ICD-10-CM | POA: Diagnosis not present

## 2022-08-12 DIAGNOSIS — H6523 Chronic serous otitis media, bilateral: Secondary | ICD-10-CM | POA: Diagnosis not present

## 2022-08-12 DIAGNOSIS — H6123 Impacted cerumen, bilateral: Secondary | ICD-10-CM | POA: Diagnosis not present

## 2022-08-13 ENCOUNTER — Ambulatory Visit: Payer: Self-pay

## 2022-08-13 NOTE — Patient Outreach (Signed)
  Care Coordination   Follow Up Visit Note   08/13/2022 Name: Eric Hicks MRN: [id number] DOB: 08/25/1926  Eric Hicks is a 89 y.o. year old male who sees Darrick Huntsman, Mar Daring, MD for primary care. I spoke with  Eric Hicks by phone today.  What matters to the patients health and wellness today?  Patient states he is doing ok. He states he continues to physical therapy weekly.  Patient denies having any pain.  He reports sustaining fall approximately 1 week ago. Patient states he was out in his yard attempted to open a lawn chair and the chair snapped back on him causing the fall. He reports having a small scratch to his face but denies any further injuries. He state he was able to get off of the ground on his own.  Patient states he continues to have q 6 month injection treatment for prostate cancer and follows up with oncologist every 6 months.     Goals Addressed             This Visit's Progress    Health management/ education related to fall prevention/ macular degeneration       Interventions Today    Flowsheet Row Most Recent Value  Chronic Disease   Chronic disease during today's visit Other  [status post rt. hip arthroplasty. Prostate cancer]  General Interventions   General Interventions Discussed/Reviewed General Interventions Reviewed, Doctor Visits  [evaluation of current treatment plan for status post hip arthroplasty / prostate cancer. Assessed for pain, new or ongoing symptoms. Assessed for ongoing home health services]  Doctor Visits Discussed/Reviewed Doctor Visits Reviewed  [Reviewed scheduled/ upcoming appointments.  Advised to call radiologist to schedule CT Scan]  Education Interventions   Education Provided Provided Education  Provided Verbal Education On Other  [Advised to do PT instructed exercises on non home health PT days. Notify provider for any new or ongoing symptoms.]  Pharmacy Interventions   Pharmacy Dicussed/Reviewed Pharmacy Topics Reviewed  [reviewed  medications and discussed compliance.]  Safety Interventions   Safety Discussed/Reviewed Fall Risk  [Assessed for falls. Discussed fall prevention recommendations.]              SDOH assessments and interventions completed:  No     Care Coordination Interventions:  Yes, provided   Follow up plan: Follow up call scheduled for 09/22/22    Encounter Outcome:  Pt. Visit Completed   George Ina RN,BSN,CCM [facility] Care Coordination [phone] direct line

## 2022-08-16 DIAGNOSIS — M810 Age-related osteoporosis without current pathological fracture: Secondary | ICD-10-CM | POA: Diagnosis not present

## 2022-08-16 DIAGNOSIS — M47892 Other spondylosis, cervical region: Secondary | ICD-10-CM | POA: Diagnosis not present

## 2022-08-16 DIAGNOSIS — I480 Paroxysmal atrial fibrillation: Secondary | ICD-10-CM | POA: Diagnosis not present

## 2022-08-16 DIAGNOSIS — I5032 Chronic diastolic (congestive) heart failure: Secondary | ICD-10-CM | POA: Diagnosis not present

## 2022-08-16 DIAGNOSIS — H409 Unspecified glaucoma: Secondary | ICD-10-CM | POA: Diagnosis not present

## 2022-08-16 DIAGNOSIS — E785 Hyperlipidemia, unspecified: Secondary | ICD-10-CM | POA: Diagnosis not present

## 2022-08-16 DIAGNOSIS — J449 Chronic obstructive pulmonary disease, unspecified: Secondary | ICD-10-CM | POA: Diagnosis not present

## 2022-08-16 DIAGNOSIS — K219 Gastro-esophageal reflux disease without esophagitis: Secondary | ICD-10-CM | POA: Diagnosis not present

## 2022-08-16 DIAGNOSIS — D631 Anemia in chronic kidney disease: Secondary | ICD-10-CM | POA: Diagnosis not present

## 2022-08-16 DIAGNOSIS — N132 Hydronephrosis with renal and ureteral calculous obstruction: Secondary | ICD-10-CM | POA: Diagnosis not present

## 2022-08-16 DIAGNOSIS — Z9181 History of falling: Secondary | ICD-10-CM | POA: Diagnosis not present

## 2022-08-16 DIAGNOSIS — Z8719 Personal history of other diseases of the digestive system: Secondary | ICD-10-CM | POA: Diagnosis not present

## 2022-08-16 DIAGNOSIS — I130 Hypertensive heart and chronic kidney disease with heart failure and stage 1 through stage 4 chronic kidney disease, or unspecified chronic kidney disease: Secondary | ICD-10-CM | POA: Diagnosis not present

## 2022-08-16 DIAGNOSIS — Z96641 Presence of right artificial hip joint: Secondary | ICD-10-CM | POA: Diagnosis not present

## 2022-08-16 DIAGNOSIS — N1831 Chronic kidney disease, stage 3a: Secondary | ICD-10-CM | POA: Diagnosis not present

## 2022-08-16 DIAGNOSIS — Z7951 Long term (current) use of inhaled steroids: Secondary | ICD-10-CM | POA: Diagnosis not present

## 2022-08-16 DIAGNOSIS — Z86711 Personal history of pulmonary embolism: Secondary | ICD-10-CM | POA: Diagnosis not present

## 2022-08-16 DIAGNOSIS — Z87440 Personal history of urinary (tract) infections: Secondary | ICD-10-CM | POA: Diagnosis not present

## 2022-08-16 DIAGNOSIS — S72001D Fracture of unspecified part of neck of right femur, subsequent encounter for closed fracture with routine healing: Secondary | ICD-10-CM | POA: Diagnosis not present

## 2022-08-16 DIAGNOSIS — I420 Dilated cardiomyopathy: Secondary | ICD-10-CM | POA: Diagnosis not present

## 2022-08-16 DIAGNOSIS — Z87891 Personal history of nicotine dependence: Secondary | ICD-10-CM | POA: Diagnosis not present

## 2022-08-16 NOTE — Telephone Encounter (Signed)
Pt is scheduled for 08/18/2022.

## 2022-08-18 ENCOUNTER — Encounter: Payer: Self-pay | Admitting: Internal Medicine

## 2022-08-18 ENCOUNTER — Ambulatory Visit (INDEPENDENT_AMBULATORY_CARE_PROVIDER_SITE_OTHER): Payer: Medicare Other | Admitting: Internal Medicine

## 2022-08-18 VITALS — BP 142/62 | HR 48 | Temp 97.4°F | Ht 70.0 in | Wt 163.2 lb

## 2022-08-18 DIAGNOSIS — N184 Chronic kidney disease, stage 4 (severe): Secondary | ICD-10-CM

## 2022-08-18 DIAGNOSIS — E785 Hyperlipidemia, unspecified: Secondary | ICD-10-CM

## 2022-08-18 DIAGNOSIS — I10 Essential (primary) hypertension: Secondary | ICD-10-CM

## 2022-08-18 DIAGNOSIS — Z8719 Personal history of other diseases of the digestive system: Secondary | ICD-10-CM | POA: Diagnosis not present

## 2022-08-18 DIAGNOSIS — R7301 Impaired fasting glucose: Secondary | ICD-10-CM

## 2022-08-18 DIAGNOSIS — I7121 Aneurysm of the ascending aorta, without rupture: Secondary | ICD-10-CM | POA: Diagnosis not present

## 2022-08-18 DIAGNOSIS — Z86711 Personal history of pulmonary embolism: Secondary | ICD-10-CM

## 2022-08-18 DIAGNOSIS — D509 Iron deficiency anemia, unspecified: Secondary | ICD-10-CM | POA: Diagnosis not present

## 2022-08-18 DIAGNOSIS — D62 Acute posthemorrhagic anemia: Secondary | ICD-10-CM | POA: Diagnosis not present

## 2022-08-18 LAB — LIPID PANEL
Cholesterol: 80 mg/dL (ref 0–200)
HDL: 38.1 mg/dL — ABNORMAL LOW (ref >39.00)
LDL Cholesterol: 22 mg/dL (ref 0–99)
NonHDL: 41.69
Total CHOL/HDL Ratio: 2
Triglycerides: 96 mg/dL (ref 0.0–149.0)
VLDL: 19.2 mg/dL (ref 0.0–40.0)

## 2022-08-18 LAB — CBC WITH DIFFERENTIAL/PLATELET
Basophils Absolute: 0 10*3/uL (ref 0.0–0.1)
Basophils Relative: 0.4 % (ref 0.0–3.0)
Eosinophils Absolute: 0.4 10*3/uL (ref 0.0–0.7)
Eosinophils Relative: 5.4 % — ABNORMAL HIGH (ref 0.0–5.0)
HCT: 37.4 % — ABNORMAL LOW (ref 39.0–52.0)
Hemoglobin: 12.6 g/dL — ABNORMAL LOW (ref 13.0–17.0)
Lymphocytes Relative: 14.8 % (ref 12.0–46.0)
Lymphs Abs: 1 10*3/uL (ref 0.7–4.0)
MCHC: 33.7 g/dL (ref 30.0–36.0)
MCV: 96.3 fl (ref 78.0–100.0)
Monocytes Absolute: 0.6 10*3/uL (ref 0.1–1.0)
Monocytes Relative: 8.7 % (ref 3.0–12.0)
Neutro Abs: 5 10*3/uL (ref 1.4–7.7)
Neutrophils Relative %: 70.7 % (ref 43.0–77.0)
Platelets: 178 10*3/uL (ref 150.0–400.0)
RBC: 3.88 Mil/uL — ABNORMAL LOW (ref 4.22–5.81)
RDW: 14.1 % (ref 11.5–15.5)
WBC: 7 10*3/uL (ref 4.0–10.5)

## 2022-08-18 LAB — COMPREHENSIVE METABOLIC PANEL
ALT: 7 U/L (ref 0–53)
AST: 16 U/L (ref 0–37)
Albumin: 3.4 g/dL — ABNORMAL LOW (ref 3.5–5.2)
Alkaline Phosphatase: 75 U/L (ref 39–117)
BUN: 44 mg/dL — ABNORMAL HIGH (ref 6–23)
CO2: 30 mEq/L (ref 19–32)
Calcium: 8.9 mg/dL (ref 8.4–10.5)
Chloride: 103 mEq/L (ref 96–112)
Creatinine, Ser: 2.03 mg/dL — ABNORMAL HIGH (ref 0.40–1.50)
GFR: 27.65 mL/min — ABNORMAL LOW (ref >60.00)
Glucose, Bld: 86 mg/dL (ref 70–99)
Potassium: 4.1 mEq/L (ref 3.5–5.1)
Sodium: 142 mEq/L (ref 135–145)
Total Bilirubin: 0.6 mg/dL (ref 0.2–1.2)
Total Protein: 5.6 g/dL — ABNORMAL LOW (ref 6.0–8.3)

## 2022-08-18 LAB — IBC + FERRITIN
Ferritin: 63.4 ng/mL (ref 22.0–322.0)
Iron: 76 ug/dL (ref 42–165)
Saturation Ratios: 28.7 % (ref 20.0–50.0)
TIBC: 264.6 ug/dL (ref 250.0–450.0)
Transferrin: 189 mg/dL — ABNORMAL LOW (ref 212.0–360.0)

## 2022-08-18 LAB — HEMOGLOBIN A1C: Hgb A1c MFr Bld: 6.7 % — ABNORMAL HIGH (ref 4.6–6.5)

## 2022-08-18 LAB — LDL CHOLESTEROL, DIRECT: Direct LDL: 25 mg/dL

## 2022-08-18 MED ORDER — ATORVASTATIN CALCIUM 20 MG PO TABS: 20.0000 mg | ORAL_TABLET | ORAL | 3 refills | Status: DC

## 2022-08-18 MED ORDER — DOXAZOSIN MESYLATE 1 MG PO TABS: 1.0000 mg | ORAL_TABLET | Freq: Every day | ORAL | 1 refills | Status: DC

## 2022-08-18 NOTE — Assessment & Plan Note (Signed)
Has been taking iron supplement since January.  Repeat labs due

## 2022-08-18 NOTE — Progress Notes (Signed)
Subjective:  Patient ID: Eric Hicks, male    DOB: Oct 27, 1926  Age: 89 y.o. MRN: [id number]  CC: The primary encounter diagnosis was Hyperlipidemia, unspecified hyperlipidemia type. Diagnoses of Impaired fasting glucose, Iron deficiency anemia, unspecified iron deficiency anemia type, White coat syndrome with diagnosis of hypertension, History of pulmonary embolism, History of GI bleed, Acute blood loss as cause of postoperative anemia, and Aneurysm of ascending aorta without rupture were also pertinent to this visit.   HPI Eric Hicks presents for  Chief Complaint  Patient presents with   Medical Management of Chronic Issues   Last in January after 2 hospitalizations for hip replacement complicated by excessive blood loss.  Since then he has had another fall .  occurred  10 days ago on his lawn while trying to open a ffoding chair. , was able to get up unassisted,  scraped forehead and nose.  Using a walker now outside  1/4 mile daily,  dose not use it  in the house .  Still riding a lawnmower   CHF with h/o LE edema :  Saw [facility] last month no changes to med:  using metolazone prn weight gain > 2 lbs overnight.  Has not had to use it at all based on weights has lost   IDA: post op blood loss:  hgb was low in Jan and iron was started.  Constipated .    CKD:  sees nephrologist next month.  GFR stable May 6    Hypotension:  readings at home by PT have been 108 to 110 systolic . On Monday BP was 90/60.  Denies dizziness .  Advised to reduce dose of coreg  Saw [facility]  for hearing loss.  Audiology sent him to  [facility] for ear cleaning.  Histroy of myringotomy tubes place 2 years ago.  Recnetly he was prescribed methylprednisolone for an ear issue of unclear diagnosis (no records) but he has not taken it due to concern for worsening heart failure.  He has been using the steroid nasal spray    Outpatient Medications Prior to Visit  Medication Sig Dispense Refill   Aflibercept (EYLEA  IO) Inject into the eye.     AMBULATORY NON FORMULARY MEDICATION Joint Advantage Gold 2 tablets daily     calcium carbonate (OS-CAL) 600 MG TABS Take 600 mg by mouth daily with breakfast.      carvedilol (COREG) 6.25 MG tablet Take 6.25 mg by mouth 2 (two) times daily.     dorzolamide-timolol (COSOPT) 22.3-6.8 MG/ML ophthalmic solution TAKE 1 DROP(S) IN RIGHT EYE 2 TIMES A DAY  5   ferrous sulfate 325 (65 FE) MG tablet Take 1 tablet by mouth daily.     furosemide (LASIX) 20 MG tablet TAKE 2 TABLETS BY MOUTH DAILY AS NEEDED (Patient taking differently: Take 40 mg by mouth daily.) 180 tablet 1   latanoprost (XALATAN) 0.005 % ophthalmic solution Place 1 drop into both eyes at bedtime.  5   leuprolide, 6 Month, (ELIGARD) 45 MG injection Inject 45 mg into the skin every 6 (six) months.     metolazone (ZAROXOLYN) 2.5 MG tablet Take 1 tablet (2.5 mg total) by mouth daily. 30 minutes prior to furosemide 90 tablet 1   omeprazole (PRILOSEC) 40 MG capsule TAKE 1 CAPSULE (40 MG TOTAL) BY MOUTH DAILY. 90 capsule 1   Vitamin D, Cholecalciferol, 25 MCG (1000 UT) CAPS Take 1 capsule by mouth daily.     WIXELA INHUB 100-50 MCG/ACT AEPB INHALE  1 PUFF INTO THE LUNGS TWICE A DAY 180 each 1   atorvastatin (LIPITOR) 20 MG tablet TAKE 1 TABLET BY MOUTH EVERY OTHER DAY 45 tablet 3   doxazosin (CARDURA) 1 MG tablet TAKE 1 TABLET BY MOUTH EVERY DAY 90 tablet 1   fluticasone (FLONASE) 50 MCG/ACT nasal spray Place 1 spray into both nostrils daily.     senna-docusate (SENOKOT-S) 8.6-50 MG tablet Take 1 tablet by mouth at bedtime as needed for mild constipation. (Patient not taking: Reported on 08/13/2022)     traMADol (ULTRAM) 50 MG tablet Take 1 tablet (50 mg total) by mouth every 6 (six) hours as needed for moderate pain. 30 tablet 0   No facility-administered medications prior to visit.    Review of Systems;  Patient denies headache, fevers, malaise, unintentional weight loss, skin rash, eye pain, sinus congestion and  sinus pain, sore throat, dysphagia,  hemoptysis , cough, dyspnea, wheezing, chest pain, palpitations, orthopnea, edema, abdominal pain, nausea, melena, diarrhea, constipation, flank pain, dysuria, hematuria, urinary  Frequency, nocturia, numbness, tingling, seizures,  Focal weakness, Loss of consciousness,  Tremor, insomnia, depression, anxiety, and suicidal ideation.      Objective:  BP (!) 142/62   Pulse (!) 48   Temp (!) 97.4 F (36.3 C) (Oral)   Ht 5\' 10"  (1.778 m)   Wt 163 lb 3.2 oz (74 kg)   SpO2 98%   BMI 23.42 kg/m   BP Readings from Last 3 Encounters:  08/18/22 (!) 142/62  07/21/22 122/80  07/21/22 122/80    Wt Readings from Last 3 Encounters:  08/18/22 163 lb 3.2 oz (74 kg)  07/21/22 162 lb 12.8 oz (73.8 kg)  07/21/22 162 lb (73.5 kg)    Physical Exam Vitals reviewed.  Constitutional:      General: He is not in acute distress.    Appearance: Normal appearance. He is normal weight. He is not ill-appearing, toxic-appearing or diaphoretic.  HENT:     Head: Normocephalic.     Jaw: There is normal jaw occlusion.      Comments: HEALING ABRASIONS ON FOREHEAD AND BRIDGE OF NOSE  Eyes:     General: No scleral icterus.       Right eye: No discharge.        Left eye: No discharge.     Conjunctiva/sclera: Conjunctivae normal.  Cardiovascular:     Rate and Rhythm: Normal rate and regular rhythm.     Heart sounds: Normal heart sounds.  Pulmonary:     Effort: Pulmonary effort is normal. No respiratory distress.     Breath sounds: Normal breath sounds.  Musculoskeletal:        General: Normal range of motion.     Cervical back: Normal range of motion.  Skin:    General: Skin is warm and dry.  Neurological:     General: No focal deficit present.     Mental Status: He is alert and oriented to person, place, and time. Mental status is at baseline.  Psychiatric:        Mood and Affect: Mood normal.        Behavior: Behavior normal.        Thought Content: Thought  content normal.        Judgment: Judgment normal.    Lab Results  Component Value Date   HGBA1C 6.7 (H) 08/18/2022   HGBA1C 6.4 03/08/2022    Lab Results  Component Value Date   CREATININE 2.03 (H) 08/18/2022   CREATININE 1.50  05/05/2022   CREATININE 1.33 (H) 03/28/2022    Lab Results  Component Value Date   WBC 7.0 08/18/2022   HGB 12.6 (L) 08/18/2022   HCT 37.4 (L) 08/18/2022   PLT 178.0 08/18/2022   GLUCOSE 86 08/18/2022   CHOL 80 08/18/2022   TRIG 96.0 08/18/2022   HDL 38.10 (L) 08/18/2022   LDLDIRECT 25.0 08/18/2022   LDLCALC 22 08/18/2022   ALT 7 08/18/2022   AST 16 08/18/2022   NA 142 08/18/2022   K 4.1 08/18/2022   CL 103 08/18/2022   CREATININE 2.03 (H) 08/18/2022   BUN 44 (H) 08/18/2022   CO2 30 08/18/2022   TSH 3.68 05/05/2022   PSA 13.67 (H) 05/15/2014   INR 1.3 (H) 03/25/2022   HGBA1C 6.7 (H) 08/18/2022   MICROALBUR 3.8 (H) 03/08/2022    No results found.  Assessment & Plan:  .Hyperlipidemia, unspecified hyperlipidemia type -     Lipid panel -     LDL cholesterol, direct  Impaired fasting glucose -     Comprehensive metabolic panel -     Hemoglobin A1c  Iron deficiency anemia, unspecified iron deficiency anemia type -     CBC with Differential/Platelet -     IBC + Ferritin  White coat syndrome with diagnosis of hypertension Assessment & Plan:  HOME READINGS HAVE BEEN CONSIDERABLY LOWER THAN IN OFFICEHE DECLINES REDUCTION /SUSPENSION OF DOXAZOSIN AND DENIES DIZZINESS.   REDUCING CARVEDILOL DOSE TO 3.125 MG    History of pulmonary embolism Assessment & Plan: Occurred in the setting of hip fracture and active lung cancer.  No longer anticoagulated due to GI bleed.    History of GI bleed Assessment & Plan: OCCURRED DURING ANTICOAGULATION FOR POST OPERATIVE PE  IN [place].  STOPPING OMEPRAZOLE as he has taken it for 90 days    Acute blood loss as cause of postoperative anemia Assessment & Plan: Has been taking iron supplement since  January.  Repeat labs due    Aneurysm of ascending aorta without rupture Assessment & Plan: There is no intervention planned due to age. He WAS reminded to continue aggressive control of blood pressure. (goal 120 or less systolic ). HOWEVER, HIS Home readings have been < 110 on 6.25 mg carvedilol. And his PT therapists have been agitating him with reports of low readings that are asymptomatic  Will continue to monitor post reduction in carvedilol dose  to 3.125 mg per patient concern about hypotension amplified by his PT  home readings.  H   Other orders -     Atorvastatin Calcium; Take 1 tablet (20 mg total) by mouth every other day.  Dispense: 45 tablet; Refill: 3 -     Doxazosin Mesylate; Take 1 tablet (1 mg total) by mouth daily.  Dispense: 90 tablet; Refill: 1      Follow-up: Return in about 3 months (around 11/17/2022).   Sherlene Shams, MD

## 2022-08-18 NOTE — Assessment & Plan Note (Addendum)
HOME READINGS HAVE BEEN CONSIDERABLY LOWER THAN IN OFFICEHE DECLINES REDUCTION /SUSPENSION OF DOXAZOSIN AND DENIES DIZZINESS.   REDUCING CARVEDILOL DOSE TO 3.125 MG

## 2022-08-18 NOTE — Assessment & Plan Note (Signed)
Occurred in the setting of hip fracture and active lung cancer.  No longer anticoagulated due to GI bleed.  

## 2022-08-18 NOTE — Patient Instructions (Addendum)
You are coming up due for your Annual Medicare Wellness visit on 10/08/2022, please schedule this appointment at checkout.    For the low blood pressure:  Reduce the carvedilol to 1/2 tablet two times daily (3.125 mg) .  If you need a new rx let me know   The omeprazole was started during your [facility] hospitalization because you had a GI bleed.  You can stop it now   I don't know why Dr Andee Poles prescribed the steroid pack .  It CAN cause fluid retention which can be a sign of heart failure so check with him about an alternative

## 2022-08-18 NOTE — Assessment & Plan Note (Signed)
There is no intervention planned due to age. He WAS reminded to continue aggressive control of blood pressure. (goal 120 or less systolic ). HOWEVER, HIS Home readings have been < 110 on 6.25 mg carvedilol. And his PT therapists have been agitating him with reports of low readings that are asymptomatic  Will continue to monitor post reduction in carvedilol dose  to 3.125 mg per patient concern about hypotension amplified by his PT  home readings.  H

## 2022-08-18 NOTE — Assessment & Plan Note (Signed)
OCCURRED DURING ANTICOAGULATION FOR POST OPERATIVE PE  IN [place].  STOPPING OMEPRAZOLE as he has taken it for 90 days

## 2022-08-21 NOTE — Assessment & Plan Note (Signed)
GFR has declined since last discharge, GFR is <30 ml/min.

## 2022-08-23 ENCOUNTER — Other Ambulatory Visit: Payer: Self-pay | Admitting: Internal Medicine

## 2022-08-26 DIAGNOSIS — H409 Unspecified glaucoma: Secondary | ICD-10-CM | POA: Diagnosis not present

## 2022-08-26 DIAGNOSIS — K219 Gastro-esophageal reflux disease without esophagitis: Secondary | ICD-10-CM | POA: Diagnosis not present

## 2022-08-26 DIAGNOSIS — M810 Age-related osteoporosis without current pathological fracture: Secondary | ICD-10-CM | POA: Diagnosis not present

## 2022-08-26 DIAGNOSIS — N132 Hydronephrosis with renal and ureteral calculous obstruction: Secondary | ICD-10-CM | POA: Diagnosis not present

## 2022-08-26 DIAGNOSIS — Z87440 Personal history of urinary (tract) infections: Secondary | ICD-10-CM | POA: Diagnosis not present

## 2022-08-26 DIAGNOSIS — J449 Chronic obstructive pulmonary disease, unspecified: Secondary | ICD-10-CM | POA: Diagnosis not present

## 2022-08-26 DIAGNOSIS — Z96641 Presence of right artificial hip joint: Secondary | ICD-10-CM | POA: Diagnosis not present

## 2022-08-26 DIAGNOSIS — S72001D Fracture of unspecified part of neck of right femur, subsequent encounter for closed fracture with routine healing: Secondary | ICD-10-CM | POA: Diagnosis not present

## 2022-08-26 DIAGNOSIS — D631 Anemia in chronic kidney disease: Secondary | ICD-10-CM | POA: Diagnosis not present

## 2022-08-26 DIAGNOSIS — M47892 Other spondylosis, cervical region: Secondary | ICD-10-CM | POA: Diagnosis not present

## 2022-08-26 DIAGNOSIS — Z87891 Personal history of nicotine dependence: Secondary | ICD-10-CM | POA: Diagnosis not present

## 2022-08-26 DIAGNOSIS — I5032 Chronic diastolic (congestive) heart failure: Secondary | ICD-10-CM | POA: Diagnosis not present

## 2022-08-26 DIAGNOSIS — Z9181 History of falling: Secondary | ICD-10-CM | POA: Diagnosis not present

## 2022-08-26 DIAGNOSIS — I480 Paroxysmal atrial fibrillation: Secondary | ICD-10-CM | POA: Diagnosis not present

## 2022-08-26 DIAGNOSIS — I130 Hypertensive heart and chronic kidney disease with heart failure and stage 1 through stage 4 chronic kidney disease, or unspecified chronic kidney disease: Secondary | ICD-10-CM | POA: Diagnosis not present

## 2022-08-26 DIAGNOSIS — Z86711 Personal history of pulmonary embolism: Secondary | ICD-10-CM | POA: Diagnosis not present

## 2022-08-26 DIAGNOSIS — E785 Hyperlipidemia, unspecified: Secondary | ICD-10-CM | POA: Diagnosis not present

## 2022-08-26 DIAGNOSIS — Z7951 Long term (current) use of inhaled steroids: Secondary | ICD-10-CM | POA: Diagnosis not present

## 2022-08-26 DIAGNOSIS — Z8719 Personal history of other diseases of the digestive system: Secondary | ICD-10-CM | POA: Diagnosis not present

## 2022-08-26 DIAGNOSIS — I420 Dilated cardiomyopathy: Secondary | ICD-10-CM | POA: Diagnosis not present

## 2022-08-26 DIAGNOSIS — N1831 Chronic kidney disease, stage 3a: Secondary | ICD-10-CM | POA: Diagnosis not present

## 2022-08-30 ENCOUNTER — Telehealth: Payer: Self-pay | Admitting: Internal Medicine

## 2022-08-30 DIAGNOSIS — N183 Chronic kidney disease, stage 3 unspecified: Secondary | ICD-10-CM | POA: Diagnosis not present

## 2022-08-30 DIAGNOSIS — I10 Essential (primary) hypertension: Secondary | ICD-10-CM | POA: Diagnosis not present

## 2022-08-30 NOTE — Telephone Encounter (Signed)
Patient called and stated he had to cancel his care coordination call on 09/22/2022 at 11 am, at that time he has an appointment with his provider. Please call patient to reschedule phone call.

## 2022-09-02 ENCOUNTER — Ambulatory Visit (INDEPENDENT_AMBULATORY_CARE_PROVIDER_SITE_OTHER): Payer: Medicare Other

## 2022-09-02 DIAGNOSIS — E538 Deficiency of other specified B group vitamins: Secondary | ICD-10-CM

## 2022-09-02 MED ORDER — CYANOCOBALAMIN 1000 MCG/ML IJ SOLN
1000.0000 ug | Freq: Once | INTRAMUSCULAR | Status: AC
Start: 2022-09-02 — End: 2022-09-02
  Administered 2022-09-02: 1000 ug via INTRAMUSCULAR

## 2022-09-02 NOTE — Progress Notes (Signed)
Patient presented for B 12 injection to left deltoid, patient voiced no concerns nor showed any signs of distress during injection. 

## 2022-09-06 ENCOUNTER — Ambulatory Visit: Payer: Medicare Other | Admitting: Internal Medicine

## 2022-09-07 DIAGNOSIS — M542 Cervicalgia: Secondary | ICD-10-CM | POA: Diagnosis not present

## 2022-09-07 DIAGNOSIS — S199XXA Unspecified injury of neck, initial encounter: Secondary | ICD-10-CM | POA: Diagnosis not present

## 2022-09-07 DIAGNOSIS — M47812 Spondylosis without myelopathy or radiculopathy, cervical region: Secondary | ICD-10-CM | POA: Diagnosis not present

## 2022-09-08 DIAGNOSIS — D492 Neoplasm of unspecified behavior of bone, soft tissue, and skin: Secondary | ICD-10-CM | POA: Diagnosis not present

## 2022-09-08 DIAGNOSIS — L570 Actinic keratosis: Secondary | ICD-10-CM | POA: Diagnosis not present

## 2022-09-08 DIAGNOSIS — D044 Carcinoma in situ of skin of scalp and neck: Secondary | ICD-10-CM | POA: Diagnosis not present

## 2022-09-08 DIAGNOSIS — Z85828 Personal history of other malignant neoplasm of skin: Secondary | ICD-10-CM | POA: Diagnosis not present

## 2022-09-08 DIAGNOSIS — H905 Unspecified sensorineural hearing loss: Secondary | ICD-10-CM | POA: Diagnosis not present

## 2022-09-08 DIAGNOSIS — D229 Melanocytic nevi, unspecified: Secondary | ICD-10-CM | POA: Diagnosis not present

## 2022-09-08 DIAGNOSIS — L814 Other melanin hyperpigmentation: Secondary | ICD-10-CM | POA: Diagnosis not present

## 2022-09-08 DIAGNOSIS — L821 Other seborrheic keratosis: Secondary | ICD-10-CM | POA: Diagnosis not present

## 2022-09-08 DIAGNOSIS — Z86007 Personal history of in-situ neoplasm of skin: Secondary | ICD-10-CM | POA: Diagnosis not present

## 2022-09-09 DIAGNOSIS — H6981 Other specified disorders of Eustachian tube, right ear: Secondary | ICD-10-CM | POA: Diagnosis not present

## 2022-09-21 ENCOUNTER — Encounter: Payer: Self-pay | Admitting: Internal Medicine

## 2022-09-21 DIAGNOSIS — H401124 Primary open-angle glaucoma, left eye, indeterminate stage: Secondary | ICD-10-CM | POA: Diagnosis not present

## 2022-09-21 DIAGNOSIS — H34812 Central retinal vein occlusion, left eye, with macular edema: Secondary | ICD-10-CM | POA: Diagnosis not present

## 2022-09-21 DIAGNOSIS — H401112 Primary open-angle glaucoma, right eye, moderate stage: Secondary | ICD-10-CM | POA: Diagnosis not present

## 2022-09-22 ENCOUNTER — Ambulatory Visit (INDEPENDENT_AMBULATORY_CARE_PROVIDER_SITE_OTHER): Payer: Medicare Other | Admitting: Internal Medicine

## 2022-09-22 ENCOUNTER — Encounter: Payer: Self-pay | Admitting: Internal Medicine

## 2022-09-22 VITALS — BP 126/72 | HR 59 | Temp 97.4°F | Ht 70.0 in | Wt 162.4 lb

## 2022-09-22 DIAGNOSIS — J432 Centrilobular emphysema: Secondary | ICD-10-CM

## 2022-09-22 DIAGNOSIS — N183 Chronic kidney disease, stage 3 unspecified: Secondary | ICD-10-CM | POA: Diagnosis not present

## 2022-09-22 DIAGNOSIS — D62 Acute posthemorrhagic anemia: Secondary | ICD-10-CM | POA: Diagnosis not present

## 2022-09-22 DIAGNOSIS — E1122 Type 2 diabetes mellitus with diabetic chronic kidney disease: Secondary | ICD-10-CM

## 2022-09-22 DIAGNOSIS — I700 Atherosclerosis of aorta: Secondary | ICD-10-CM | POA: Diagnosis not present

## 2022-09-22 LAB — MICROALBUMIN / CREATININE URINE RATIO
Creatinine,U: 87.7 mg/dL
Microalb Creat Ratio: 12.8 mg/g (ref 0.0–30.0)
Microalb, Ur: 11.2 mg/dL — ABNORMAL HIGH (ref 0.0–1.9)

## 2022-09-22 NOTE — Progress Notes (Signed)
Subjective:  Patient ID: Eric Hicks, male    DOB: 1926/11/12  Age: 89 y.o. MRN: [id number]  CC: The primary encounter diagnosis was Controlled type 2 diabetes mellitus with stage 3 chronic kidney disease, without long-term current use of insulin (HCC). Diagnoses of Acute blood loss as cause of postoperative anemia, Aortic arch atherosclerosis (HCC), and Centrilobular emphysema (HCC) were also pertinent to this visit.   HPI Eric Hicks presents for follow up on recent diagnosis of type 2 DM  Chief Complaint  Patient presents with   Medical Management of Chronic Issues    1 month follow up    1) Aortic atherosclerosis : Reviewed findings of prior CT scan today..  Patient is tolerating high potency statin therapy  (Lipitor)   2) type 2 DM with CKD :  new diagnosis with A1c of 6.7  drinking boost  daily,  has ice cream or italian ice  .daily,  otherwise diet is low glycemic index    3) HTN: low readings have resolved since reducing coreg dose  4) Prostate CA : receiving Elegard by Richardson Landry every 6 months for years .  3) Frequent falls:  seen on e month ago , no falls since then  using a cane.     Outpatient Medications Prior to Visit  Medication Sig Dispense Refill   Aflibercept (EYLEA IO) Inject into the eye.     AMBULATORY NON FORMULARY MEDICATION Joint Advantage Gold 2 tablets daily     atorvastatin (LIPITOR) 20 MG tablet TAKE 1 TABLET BY MOUTH EVERY OTHER DAY 45 tablet 3   brimonidine (ALPHAGAN) 0.2 % ophthalmic solution Place 1 drop into the left eye 2 (two) times daily.     calcium carbonate (OS-CAL) 600 MG TABS Take 600 mg by mouth daily with breakfast.      carvedilol (COREG) 3.125 MG tablet Take 1 tablet (3.125 mg total) by mouth two (2) times a day. Hold your dose if blood pressure less than 110     cyclobenzaprine (FLEXERIL) 5 MG tablet Take 5 mg by mouth 3 (three) times daily as needed.     dorzolamide-timolol (COSOPT) 22.3-6.8 MG/ML ophthalmic solution TAKE 1 DROP(S) IN  RIGHT EYE 2 TIMES A DAY  5   doxazosin (CARDURA) 1 MG tablet Take 1 tablet (1 mg total) by mouth daily. 90 tablet 1   ferrous sulfate 325 (65 FE) MG tablet Take 1 tablet by mouth daily.     fluticasone (FLONASE) 50 MCG/ACT nasal spray Place 1 spray into both nostrils daily.     furosemide (LASIX) 20 MG tablet TAKE 2 TABLETS BY MOUTH DAILY AS NEEDED (Patient taking differently: Take 40 mg by mouth daily.) 180 tablet 1   latanoprost (XALATAN) 0.005 % ophthalmic solution Place 1 drop into both eyes at bedtime.  5   leuprolide, 6 Month, (ELIGARD) 45 MG injection Inject 45 mg into the skin every 6 (six) months.     metolazone (ZAROXOLYN) 2.5 MG tablet Take 1 tablet (2.5 mg total) by mouth daily. 30 minutes prior to furosemide 90 tablet 1   Vitamin D, Cholecalciferol, 25 MCG (1000 UT) CAPS Take 1 capsule by mouth daily.     WIXELA INHUB 100-50 MCG/ACT AEPB INHALE 1 PUFF INTO THE LUNGS TWICE A DAY 180 each 1   carvedilol (COREG) 6.25 MG tablet Take 6.25 mg by mouth 2 (two) times daily. (Patient not taking: Reported on 09/22/2022)     omeprazole (PRILOSEC) 40 MG capsule TAKE 1 CAPSULE (40 MG  TOTAL) BY MOUTH DAILY. 90 capsule 1   No facility-administered medications prior to visit.    Review of Systems;  Patient denies headache, fevers, malaise, unintentional weight loss, skin rash, eye pain, sinus congestion and sinus pain, sore throat, dysphagia,  hemoptysis , cough, dyspnea, wheezing, chest pain, palpitations, orthopnea, edema, abdominal pain, nausea, melena, diarrhea, constipation, flank pain, dysuria, hematuria, urinary  Frequency, nocturia, numbness, tingling, seizures,  Focal weakness, Loss of consciousness,  Tremor, insomnia, depression, anxiety, and suicidal ideation.      Objective:  BP 126/72   Pulse (!) 59   Temp (!) 97.4 F (36.3 C) (Oral)   Ht 5\' 10"  (1.778 m)   Wt 162 lb 6.4 oz (73.7 kg)   SpO2 97%   BMI 23.30 kg/m   BP Readings from Last 3 Encounters:  09/22/22 126/72   08/18/22 (!) 142/62  07/21/22 122/80    Wt Readings from Last 3 Encounters:  09/22/22 162 lb 6.4 oz (73.7 kg)  08/18/22 163 lb 3.2 oz (74 kg)  07/21/22 162 lb 12.8 oz (73.8 kg)    Physical Exam Vitals reviewed.  Constitutional:      General: He is not in acute distress.    Appearance: Normal appearance. He is normal weight. He is not ill-appearing, toxic-appearing or diaphoretic.  HENT:     Head: Normocephalic.  Eyes:     General: No scleral icterus.       Right eye: No discharge.        Left eye: No discharge.     Conjunctiva/sclera: Conjunctivae normal.  Cardiovascular:     Rate and Rhythm: Normal rate and regular rhythm.     Heart sounds: Normal heart sounds.  Pulmonary:     Effort: Pulmonary effort is normal. No respiratory distress.     Breath sounds: Normal breath sounds.  Musculoskeletal:        General: Normal range of motion.     Cervical back: Normal range of motion.  Skin:    General: Skin is warm and dry.  Neurological:     General: No focal deficit present.     Mental Status: He is alert and oriented to person, place, and time. Mental status is at baseline.  Psychiatric:        Mood and Affect: Mood normal.        Behavior: Behavior normal.        Thought Content: Thought content normal.        Judgment: Judgment normal.    Lab Results  Component Value Date   HGBA1C 6.7 (H) 08/18/2022   HGBA1C 6.4 03/08/2022    Lab Results  Component Value Date   CREATININE 2.03 (H) 08/18/2022   CREATININE 1.50 05/05/2022   CREATININE 1.33 (H) 03/28/2022    Lab Results  Component Value Date   WBC 7.0 08/18/2022   HGB 12.6 (L) 08/18/2022   HCT 37.4 (L) 08/18/2022   PLT 178.0 08/18/2022   GLUCOSE 86 08/18/2022   CHOL 80 08/18/2022   TRIG 96.0 08/18/2022   HDL 38.10 (L) 08/18/2022   LDLDIRECT 25.0 08/18/2022   LDLCALC 22 08/18/2022   ALT 7 08/18/2022   AST 16 08/18/2022   NA 142 08/18/2022   K 4.1 08/18/2022   CL 103 08/18/2022   CREATININE 2.03 (H)  08/18/2022   BUN 44 (H) 08/18/2022   CO2 30 08/18/2022   TSH 3.68 05/05/2022   PSA 13.67 (H) 05/15/2014   INR 1.3 (H) 03/25/2022   HGBA1C 6.7 (H)  08/18/2022   MICROALBUR 11.2 (H) 09/22/2022    No results found.  Assessment & Plan:  .Controlled type 2 diabetes mellitus with stage 3 chronic kidney disease, without long-term current use of insulin (HCC) Assessment & Plan:  Historically well-controlled on diet alone .  hemoglobin A1c has been consistently at or  less than 7.0 . Patient is up-to-date on eye exams and foot exam is normal today.  Patient is tolerating statin therapy for or CAD risk reduction  but not taking and ARB due to hypotension  Lab Results  Component Value Date   HGBA1C 6.7 (H) 08/18/2022     Orders: -     Microalbumin / creatinine urine ratio  Acute blood loss as cause of postoperative anemia Assessment & Plan: Has been taking iron supplement since January. With improvement in hgb and resolution of iron deficiency   Lab Results  Component Value Date   WBC 7.0 08/18/2022   HGB 12.6 (L) 08/18/2022   HCT 37.4 (L) 08/18/2022   MCV 96.3 08/18/2022   PLT 178.0 08/18/2022   Lab Results  Component Value Date   IRON 76 08/18/2022   TIBC 264.6 08/18/2022   FERRITIN 63.4 08/18/2022      Aortic arch atherosclerosis (HCC) Assessment & Plan: Incidental finding on prior CT chest.  He is tolerating atorvastatin   Centrilobular emphysema (HCC) Assessment & Plan: He remains asymptomatic with ADLS,  Some dyspnea with exertion without desauration s      I provided 30 minutes of face-to-face time during this encounter reviewing patient's last visit with me, patient's  recent surgical and non surgical procedures, previous  labs an, d imaging studies, counseling on currently addressed issues,  and post visit ordering to diagnostics and therapeutics .   Follow-up: Return in about 3 months (around 12/23/2022) for follow up diabetes.   Sherlene Shams, MD

## 2022-09-22 NOTE — Patient Instructions (Addendum)
1)  Here is an alternative to Boost:  You might want to try a premixed protein drink called Premier Protein shake in the morning.  It is less $$$ and very low sugar.    160 cal  30 g protein  1 g sugar 50% calcium needs    4 packs and cases at [employer] and at [facility]   2)  You now have "type 2 Diabetes"  Because of this I am checking your urine for protein.  If this is present I will change your blood pressure medication

## 2022-09-24 DIAGNOSIS — N183 Chronic kidney disease, stage 3 unspecified: Secondary | ICD-10-CM | POA: Insufficient documentation

## 2022-09-24 NOTE — Assessment & Plan Note (Signed)
Has been taking iron supplement since January. With improvement in hgb and resolution of iron deficiency   Lab Results  Component Value Date   WBC 7.0 08/18/2022   HGB 12.6 (L) 08/18/2022   HCT 37.4 (L) 08/18/2022   MCV 96.3 08/18/2022   PLT 178.0 08/18/2022   Lab Results  Component Value Date   IRON 76 08/18/2022   TIBC 264.6 08/18/2022   FERRITIN 63.4 08/18/2022

## 2022-09-24 NOTE — Assessment & Plan Note (Addendum)
Historically well-controlled on diet alone .  hemoglobin A1c has been consistently at or  less than 7.0 . Patient is up-to-date on eye exams and foot exam is normal today.  Patient is tolerating statin therapy for or CAD risk reduction  but not taking and ARB due to hypotension  Lab Results  Component Value Date   HGBA1C 6.7 (H) 08/18/2022

## 2022-09-24 NOTE — Assessment & Plan Note (Signed)
Incidental finding on prior CT chest.  He is tolerating atorvastatin 

## 2022-09-24 NOTE — Assessment & Plan Note (Signed)
He remains asymptomatic with ADLS,  Some dyspnea with exertion without desauration s ?

## 2022-10-01 ENCOUNTER — Ambulatory Visit: Payer: Self-pay

## 2022-10-01 NOTE — Patient Instructions (Signed)
Visit Information  Thank you for taking time to visit with me today. Please don't hesitate to contact me if I can be of assistance to you.   Following are the goals we discussed today:  Continue to monitor blood pressure daily and record. Notify provider for lightheadedness or dizziness with low blood pressures Decrease sugar and increase protein in diet.  Review education information sent to you on diabetes management.  Continue to use can when ambulating to reduce risk of falls.   Our next appointment is by telephone on 11/18/22 at 10 am  Please call the care guide team at [phone] if you need to cancel or reschedule your appointment.   If you are experiencing a Mental Health or Behavioral Health Crisis or need someone to talk to, please call the Suicide and Crisis Lifeline: 988 call 1-800-273-TALK (toll free, 24 hour hotline)  Patient verbalizes understanding of instructions and care plan provided today and agrees to view in MyChart. Active MyChart status and patient understanding of how to access instructions and care plan via MyChart confirmed with patient.     George Ina RN,BSN,CCM [facility] Care Coordination [phone] direct line  Type 2 Diabetes Mellitus, Self-Care, Adult When you have type 2 diabetes (type 2 diabetes mellitus), you must make sure your blood sugar (glucose) stays in a healthy range. You can do this with: Nutrition. Exercise. Lifestyle changes. Medicines or insulin, if needed. Support from your doctors and others. What are the risks? Having type 2 diabetes can raise your risk for other long-term (chronic) health problems. You may get medicines to help prevent these problems. How to stay aware of your blood sugar  Check your blood sugar level every day, as often as told. Have your A1C (hemoglobin A1C) level checked two or more times a year. Have it checked more often if told. Your doctor will set personal treatment goals for you. In general, you should  have these blood sugar levels: Before meals: 80-130 mg/dL (2.9-5.6 mmol/L). After meals: below 180 mg/dL (10 mmol/L). A1C: less than 7%. How to manage high and low blood sugar Symptoms of high blood sugar High blood sugar is also called hyperglycemia. Know the symptoms of high blood sugar. These may include: More thirst. Hunger. Feeling very tired. Needing to pee (urinate) more often than normal. Seeing things blurry. Symptoms of low blood sugar Low blood sugar is also called hypoglycemia. This is when blood sugar is at or below 70 mg/dL (3.9 mmol/L). Symptoms may include: Hunger. Feeling worried or nervous (anxious). Feeling sweaty and cold to the touch (clammy). Being dizzy or light-headed. Feeling sleepy. A fast heartbeat. Feeling grouchy (irritable). Tingling or loss of feeling (numbness) around your mouth, lips, or tongue. Restless sleep. Diabetes medicines can cause low blood sugar. You are more at risk: While you exercise. After exercise. During sleep. When you are sick. When you skip meals or do not eat for a long time. Treating low blood sugar If you think you have low blood sugar, eat or drink something sugary right away. Keep 15 grams of a fast-acting carb (carbohydrate) with you all the time. Make sure your family and friends know how to treat you if you cannot treat yourself. Treating very low blood sugar Severe hypoglycemia is when your blood sugar is at or below 54 mg/dL (3 mmol/L). Severe hypoglycemia is an emergency. Get medical help right away. Call your local emergency services (911 in the [place] Do not wait to see if the symptoms will go away. Do  not drive yourself to the hospital. You may need a glucagon shot if you have very low blood sugar and you cannot eat or drink. Have a family member or friend learn how to check your blood sugar and how to give you a glucagon shot. Ask your doctor if you should have a kit for glucagon shots. Follow these instructions  at home: Medicines Take prescribed insulin or diabetes medicines as told by your health care provider. Do not run out of insulin or other medicines. Plan ahead. If you use insulin, change the amount you take based on how active you are and what foods you eat. Your doctor will tell you how to do this. Take over-the-counter and prescription medicines only as told by your doctor. Eating and drinking  Eat healthy foods. These include: Low-fat (lean) proteins. Complex carbs, such as whole grains. Fresh fruits and vegetables. Low-fat dairy products. Healthy fats. Meet with a food expert (dietitian) to make an eating plan. Follow instructions from your doctor about what you cannot eat or drink. Drink enough fluid to keep your pee (urine) pale yellow. Keep track of carbs that you eat. Read food labels and learn serving sizes of foods. Follow your sick-day plan when you cannot eat or drink as normal. Make this plan with your doctor so it is ready to use. Activity Exercise as told by your doctor. You may need to: Do stretching and strength exercises two or more times a week. Do 150 minutes or more of exercise each week that makes your heart beat faster and makes you sweat. Spread out your exercise over 3 or more days a week. Do not go more than 2 days in a row without exercise. Talk with your doctor before you start a new exercise. Your doctor may tell you to change: How much insulin or medicines you take. How much food you eat. Lifestyle Do not smoke or use any products that contain nicotine or tobacco. If you need help quitting, ask your doctor. If you drink alcohol and your doctor says that it is safe for you: Limit how much you have to: 0-1 drink a day for women who are not pregnant. 0-2 drinks a day for men. Know how much alcohol is in your drink. In the [place] one drink equals one 12 oz bottle of beer (355 mL), one 5 oz glass of wine (148 mL), or one 1 oz glass of hard liquor (44  mL). Learn to deal with stress. If you need help, ask your doctor. Body care  Stay up to date with your shots (immunizations). Have your eyes and feet checked by a doctor as often as told. Check your skin and feet every day. Check for cuts, bruises, redness, blisters, or sores. Brush your teeth and gums two times a day. Floss one or more times a day. Go to the dentist one or more times every 6 months. Stay at a healthy weight. General instructions Share your diabetes care plan with: Your work or school. People you live with. Carry a card or wear jewelry that says you have diabetes. Keep all follow-up visits. Questions to ask your doctor Do I need to meet with a certified expert in diabetes education and care? Where can I find a support group? Where to find more information For help and guidance and more information about diabetes, please go to: American Diabetes Association: www.diabetes.org American Association of Diabetes Care and Education Specialists: www.diabeteseducator.org International Diabetes Federation: DCOnly.dk Summary When you have type 2  diabetes, you must make sure your blood sugar (glucose) stays in a healthy range. You can do this with nutrition, exercise, medicines and insulin, and support from doctors and others. Check your blood sugar every day, or as often as told. Having diabetes can raise your risk for other long-term health problems. You may get medicines to help prevent these problems. Share your diabetes management plan with people at work, school, and home. Keep all follow-up visits. This information is not intended to replace advice given to you by your health care provider. Make sure you discuss any questions you have with your health care provider. Document Revised: 07/07/2020 Document Reviewed: 07/07/2020 Elsevier Patient Education  2024 [employer].

## 2022-10-01 NOTE — Patient Outreach (Signed)
  Care Coordination   Follow Up Visit Note   10/01/2022 Name: Eric Hicks MRN: [id number] DOB: Nov 07, 1926  Eric Hicks is a 89 y.o. year old male who sees Darrick Huntsman, Mar Daring, MD for primary care. I spoke with  Eric Hicks by phone today.  What matters to the patients health and wellness today?  New diagnosis of diabetes. DM:  Patient states he didn't know he had diabetes until recently.  He states he was not advised to check blood sugars at this time.  Patient states he purchased the suggested nutritional supplement drink recommended by his provider.  Patient reports eating waffle/ syrup, small amount of bacon and orange juice for breakfast.  He states he is eating more chicken, [place], or fish for dinner.  Patients recent Hgb A1c of 6.7 discussed. HTN:  Patient states his blood pressures were running low and provider decreased his coreg.  He reports recent blood pressures range from 110-120/60-70.  Patient denies any lightheadedness or dizziness. He states he continues to monitor his blood pressure daily and records. Falls/ Macular degeneration:  Patient denies any recent falls.  Ambulates with cane.  Patient reports having new eye drop added to treatment plan for macular degeneration.      Goals Addressed             This Visit's Progress    Health management/ education related to fall prevention/ macular degeneration       Interventions Today    Flowsheet Row Most Recent Value  Chronic Disease   Chronic disease during today's visit Diabetes, Hypertension (HTN), Other  [falls, macular degeneration]  General Interventions   General Interventions Discussed/Reviewed General Interventions Reviewed, Doctor Visits, Labs  [evaluation of current treatment plan  for DM, HTN, falls,macular degeneration and patients adherence to plan as established by provider. Assessed blood pressure readings, falls, patients understanding of DM.]  Labs Hgb A1c every 3 months  [Discussed current A1c level.  Explained what Hgb A1c testing means.]  Doctor Visits Discussed/Reviewed Doctor Visits Reviewed  Annabell Sabal scheduled/ upcoming provider visits.]  Education Interventions   Education Provided Provided Printed Education, Provided Education  [Discussed diabetes and diabetes management.  Mailed patient education article to patient on diabetes management.  Advised to continue to monitor BP daily and record.]  Provided Verbal Education On Blood Sugar Monitoring  [Inquired if patient was advised by provider to monitor blood sugar]  Nutrition Interventions   Nutrition Discussed/Reviewed Nutrition Discussed, Decreasing sugar intake, Increasing proteins  [Reviewed patients diet.  Advised importance of decreasing sugar /carbs.  Stressed eating more protein, increasing vegetables / fruit. Advised to drink nutritional supplements recommended by provider. Discussed protein examples/ meal examples.]  Pharmacy Interventions   Pharmacy Dicussed/Reviewed Pharmacy Topics Discussed  [medications reviewed and compliance discussed.]  Safety Interventions   Safety Discussed/Reviewed Fall Risk  [assessed for falls. Encouraged patient to use cane when ambulating. Assessed for lightheadedness/ dizziness with low blood sugars.]              SDOH assessments and interventions completed:  No     Care Coordination Interventions:  Yes, provided   Follow up plan: Follow up call scheduled for 11/18/22    Encounter Outcome:  Pt. Visit Completed   George Ina RN,BSN,CCM [facility] Care Coordination [phone] direct line

## 2022-10-04 ENCOUNTER — Ambulatory Visit: Payer: Medicare Other

## 2022-10-07 ENCOUNTER — Ambulatory Visit (INDEPENDENT_AMBULATORY_CARE_PROVIDER_SITE_OTHER): Payer: Medicare Other

## 2022-10-07 DIAGNOSIS — E538 Deficiency of other specified B group vitamins: Secondary | ICD-10-CM

## 2022-10-07 MED ORDER — CYANOCOBALAMIN 1000 MCG/ML IJ SOLN
1000.0000 ug | Freq: Once | INTRAMUSCULAR | Status: AC
Start: 2022-10-07 — End: 2022-10-07
  Administered 2022-10-07: 1000 ug via INTRAMUSCULAR

## 2022-10-07 NOTE — Progress Notes (Signed)
Patient arrived for a B12 injection and it was administered into his left deltoid. Patient tolerated the injection well and did not show any signs of distress or voice any concerns. 

## 2022-10-11 ENCOUNTER — Ambulatory Visit (INDEPENDENT_AMBULATORY_CARE_PROVIDER_SITE_OTHER): Payer: Medicare Other

## 2022-10-11 VITALS — Ht 70.0 in | Wt 158.0 lb

## 2022-10-11 DIAGNOSIS — Z0000 Encounter for general adult medical examination without abnormal findings: Secondary | ICD-10-CM | POA: Diagnosis not present
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
## 2022-10-19 DIAGNOSIS — H401112 Primary open-angle glaucoma, right eye, moderate stage: Secondary | ICD-10-CM | POA: Diagnosis not present

## 2022-10-19 DIAGNOSIS — H34812 Central retinal vein occlusion, left eye, with macular edema: Secondary | ICD-10-CM | POA: Diagnosis not present

## 2022-10-19 DIAGNOSIS — H401124 Primary open-angle glaucoma, left eye, indeterminate stage: Secondary | ICD-10-CM | POA: Diagnosis not present

## 2022-10-20 DIAGNOSIS — I482 Chronic atrial fibrillation, unspecified: Secondary | ICD-10-CM | POA: Diagnosis not present

## 2022-10-20 DIAGNOSIS — I480 Paroxysmal atrial fibrillation: Secondary | ICD-10-CM | POA: Diagnosis not present

## 2022-10-20 DIAGNOSIS — J439 Emphysema, unspecified: Secondary | ICD-10-CM | POA: Diagnosis not present

## 2022-10-20 DIAGNOSIS — I700 Atherosclerosis of aorta: Secondary | ICD-10-CM | POA: Diagnosis not present

## 2022-10-20 DIAGNOSIS — I420 Dilated cardiomyopathy: Secondary | ICD-10-CM | POA: Diagnosis not present

## 2022-10-20 DIAGNOSIS — I10 Essential (primary) hypertension: Secondary | ICD-10-CM | POA: Diagnosis not present

## 2022-10-20 DIAGNOSIS — R918 Other nonspecific abnormal finding of lung field: Secondary | ICD-10-CM | POA: Diagnosis not present

## 2022-10-20 DIAGNOSIS — I7121 Aneurysm of the ascending aorta, without rupture: Secondary | ICD-10-CM | POA: Diagnosis not present

## 2022-10-20 DIAGNOSIS — N182 Chronic kidney disease, stage 2 (mild): Secondary | ICD-10-CM | POA: Diagnosis not present

## 2022-10-20 DIAGNOSIS — R0602 Shortness of breath: Secondary | ICD-10-CM | POA: Diagnosis not present

## 2022-11-04 ENCOUNTER — Other Ambulatory Visit: Payer: Self-pay | Admitting: Internal Medicine

## 2022-11-04 DIAGNOSIS — H6983 Other specified disorders of Eustachian tube, bilateral: Secondary | ICD-10-CM | POA: Diagnosis not present

## 2022-11-04 DIAGNOSIS — H903 Sensorineural hearing loss, bilateral: Secondary | ICD-10-CM | POA: Diagnosis not present

## 2022-11-04 DIAGNOSIS — H6982 Other specified disorders of Eustachian tube, left ear: Secondary | ICD-10-CM | POA: Diagnosis not present

## 2022-11-08 ENCOUNTER — Ambulatory Visit (INDEPENDENT_AMBULATORY_CARE_PROVIDER_SITE_OTHER): Payer: Medicare Other | Admitting: *Deleted

## 2022-11-08 DIAGNOSIS — E538 Deficiency of other specified B group vitamins: Secondary | ICD-10-CM

## 2022-11-08 MED ORDER — CYANOCOBALAMIN 1000 MCG/ML IJ SOLN
1000.0000 ug | Freq: Once | INTRAMUSCULAR | Status: AC
Start: 2022-11-08 — End: 2022-11-08
  Administered 2022-11-08: 1000 ug via INTRAMUSCULAR

## 2022-11-08 NOTE — Progress Notes (Signed)
Patient arrived for a B12 injection and it was administered into his right deltoid. Patient tolerated the injection well and did not show any signs of distress or voice any concerns.

## 2022-11-11 DIAGNOSIS — H34812 Central retinal vein occlusion, left eye, with macular edema: Secondary | ICD-10-CM | POA: Diagnosis not present

## 2022-11-11 DIAGNOSIS — Z961 Presence of intraocular lens: Secondary | ICD-10-CM | POA: Diagnosis not present

## 2022-11-11 DIAGNOSIS — H401124 Primary open-angle glaucoma, left eye, indeterminate stage: Secondary | ICD-10-CM | POA: Diagnosis not present

## 2022-11-11 DIAGNOSIS — H401112 Primary open-angle glaucoma, right eye, moderate stage: Secondary | ICD-10-CM | POA: Diagnosis not present

## 2022-11-17 ENCOUNTER — Encounter: Payer: Self-pay | Admitting: Internal Medicine

## 2022-11-17 ENCOUNTER — Ambulatory Visit: Payer: Medicare Other | Admitting: Internal Medicine

## 2022-11-17 VITALS — BP 96/60 | HR 71 | Temp 97.5°F | Ht 70.0 in | Wt 158.8 lb

## 2022-11-17 DIAGNOSIS — N1831 Chronic kidney disease, stage 3a: Secondary | ICD-10-CM | POA: Diagnosis not present

## 2022-11-17 DIAGNOSIS — R296 Repeated falls: Secondary | ICD-10-CM | POA: Diagnosis not present

## 2022-11-17 DIAGNOSIS — E1122 Type 2 diabetes mellitus with diabetic chronic kidney disease: Secondary | ICD-10-CM

## 2022-11-17 DIAGNOSIS — E785 Hyperlipidemia, unspecified: Secondary | ICD-10-CM | POA: Diagnosis not present

## 2022-11-17 DIAGNOSIS — S51812A Laceration without foreign body of left forearm, initial encounter: Secondary | ICD-10-CM

## 2022-11-17 DIAGNOSIS — N179 Acute kidney failure, unspecified: Secondary | ICD-10-CM

## 2022-11-17 DIAGNOSIS — D62 Acute posthemorrhagic anemia: Secondary | ICD-10-CM

## 2022-11-17 DIAGNOSIS — N183 Chronic kidney disease, stage 3 unspecified: Secondary | ICD-10-CM | POA: Diagnosis not present

## 2022-11-17 MED ORDER — MIRTAZAPINE 30 MG PO TABS: 30.0000 mg | ORAL_TABLET | Freq: Every day | ORAL | 2 refills | Status: DC

## 2022-11-17 MED ORDER — FLUTICASONE-SALMETEROL 100-50 MCG/ACT IN AEPB: 1.0000 | INHALATION_SPRAY | Freq: Two times a day (BID) | RESPIRATORY_TRACT | 1 refills | Status: DC

## 2022-11-17 NOTE — Patient Instructions (Addendum)
sTOP THE CARDURA (doxazosin).  It is dropping your pressure  Start the mirtazpine 30 mg : take this at bedtime.  This is an appetite stimulator.      Keep the arm wrapped and protected  Return in one month     YOU NEED TO HAVE SOMEONE WITH YOU WHEN YOU ARE SHOWERING,  PREPARING FOOD ETC.
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
## 2022-11-18 ENCOUNTER — Encounter: Payer: Self-pay | Admitting: Internal Medicine

## 2022-11-18 ENCOUNTER — Observation Stay: Payer: Medicare Other

## 2022-11-18 ENCOUNTER — Inpatient Hospital Stay
Admission: EM | Admit: 2022-11-18 | Discharge: 2022-11-20 | DRG: 683 | Disposition: A | Discharge status: Home / Self Care | Payer: Medicare Other | Attending: Osteopathic Medicine | Admitting: Osteopathic Medicine

## 2022-11-18 ENCOUNTER — Ambulatory Visit: Payer: Self-pay

## 2022-11-18 ENCOUNTER — Other Ambulatory Visit: Payer: Self-pay

## 2022-11-18 ENCOUNTER — Telehealth: Payer: Self-pay | Admitting: *Deleted

## 2022-11-18 DIAGNOSIS — N400 Enlarged prostate without lower urinary tract symptoms: Secondary | ICD-10-CM | POA: Diagnosis present

## 2022-11-18 DIAGNOSIS — Z8249 Family history of ischemic heart disease and other diseases of the circulatory system: Secondary | ICD-10-CM | POA: Diagnosis not present

## 2022-11-18 DIAGNOSIS — Z807 Family history of other malignant neoplasms of lymphoid, hematopoietic and related tissues: Secondary | ICD-10-CM | POA: Diagnosis not present

## 2022-11-18 DIAGNOSIS — N179 Acute kidney failure, unspecified: Secondary | ICD-10-CM

## 2022-11-18 DIAGNOSIS — J439 Emphysema, unspecified: Secondary | ICD-10-CM | POA: Diagnosis present

## 2022-11-18 DIAGNOSIS — Z886 Allergy status to analgesic agent status: Secondary | ICD-10-CM

## 2022-11-18 DIAGNOSIS — Z66 Do not resuscitate: Secondary | ICD-10-CM | POA: Diagnosis not present

## 2022-11-18 DIAGNOSIS — N1831 Chronic kidney disease, stage 3a: Secondary | ICD-10-CM | POA: Diagnosis present

## 2022-11-18 DIAGNOSIS — I480 Paroxysmal atrial fibrillation: Secondary | ICD-10-CM | POA: Diagnosis not present

## 2022-11-18 DIAGNOSIS — E876 Hypokalemia: Secondary | ICD-10-CM | POA: Diagnosis not present

## 2022-11-18 DIAGNOSIS — R296 Repeated falls: Secondary | ICD-10-CM | POA: Diagnosis not present

## 2022-11-18 DIAGNOSIS — Z833 Family history of diabetes mellitus: Secondary | ICD-10-CM

## 2022-11-18 DIAGNOSIS — E785 Hyperlipidemia, unspecified: Secondary | ICD-10-CM | POA: Diagnosis present

## 2022-11-18 DIAGNOSIS — I10 Essential (primary) hypertension: Secondary | ICD-10-CM | POA: Diagnosis not present

## 2022-11-18 DIAGNOSIS — M810 Age-related osteoporosis without current pathological fracture: Secondary | ICD-10-CM | POA: Diagnosis present

## 2022-11-18 DIAGNOSIS — Z87891 Personal history of nicotine dependence: Secondary | ICD-10-CM | POA: Diagnosis not present

## 2022-11-18 DIAGNOSIS — Z96641 Presence of right artificial hip joint: Secondary | ICD-10-CM | POA: Diagnosis present

## 2022-11-18 DIAGNOSIS — I130 Hypertensive heart and chronic kidney disease with heart failure and stage 1 through stage 4 chronic kidney disease, or unspecified chronic kidney disease: Secondary | ICD-10-CM | POA: Diagnosis not present

## 2022-11-18 DIAGNOSIS — I5032 Chronic diastolic (congestive) heart failure: Secondary | ICD-10-CM | POA: Diagnosis not present

## 2022-11-18 DIAGNOSIS — N200 Calculus of kidney: Secondary | ICD-10-CM | POA: Diagnosis not present

## 2022-11-18 DIAGNOSIS — Z79899 Other long term (current) drug therapy: Secondary | ICD-10-CM

## 2022-11-18 DIAGNOSIS — Z8546 Personal history of malignant neoplasm of prostate: Secondary | ICD-10-CM

## 2022-11-18 DIAGNOSIS — Z139 Encounter for screening, unspecified: Secondary | ICD-10-CM

## 2022-11-18 DIAGNOSIS — N281 Cyst of kidney, acquired: Secondary | ICD-10-CM | POA: Diagnosis not present

## 2022-11-18 LAB — CBC
HCT: 34.3 % — ABNORMAL LOW (ref 39.0–52.0)
Hemoglobin: 11.5 g/dL — ABNORMAL LOW (ref 13.0–17.0)
MCH: 32.3 pg (ref 26.0–34.0)
MCHC: 33.5 g/dL (ref 30.0–36.0)
MCV: 96.3 fL (ref 80.0–100.0)
Platelets: 224 10*3/uL (ref 150–400)
RBC: 3.56 MIL/uL — ABNORMAL LOW (ref 4.22–5.81)
RDW: 13.4 % (ref 11.5–15.5)
WBC: 8.3 10*3/uL (ref 4.0–10.5)
nRBC: 0 % (ref 0.0–0.2)

## 2022-11-18 LAB — COMPREHENSIVE METABOLIC PANEL
ALT: 15 U/L (ref 0–53)
AST: 20 U/L (ref 0–37)
Albumin: 3.2 g/dL — ABNORMAL LOW (ref 3.5–5.2)
Alkaline Phosphatase: 51 U/L (ref 39–117)
CO2: 30 mEq/L (ref 19–32)
Calcium: 8.8 mg/dL (ref 8.4–10.5)
Chloride: 99 mEq/L (ref 96–112)
Glucose, Bld: 126 mg/dL — ABNORMAL HIGH (ref 70–99)
Potassium: 3.1 mEq/L — ABNORMAL LOW (ref 3.5–5.1)
Sodium: 139 mEq/L (ref 135–145)
Total Bilirubin: 0.7 mg/dL (ref 0.2–1.2)
Total Protein: 5.9 g/dL — ABNORMAL LOW (ref 6.0–8.3)

## 2022-11-18 LAB — BASIC METABOLIC PANEL
Anion gap: 11 (ref 5–15)
BUN: 86 mg/dL — ABNORMAL HIGH (ref 8–23)
CO2: 25 mmol/L (ref 22–32)
Calcium: 8.4 mg/dL — ABNORMAL LOW (ref 8.9–10.3)
Chloride: 103 mmol/L (ref 98–111)
Creatinine, Ser: 3.11 mg/dL — ABNORMAL HIGH (ref 0.61–1.24)
GFR, Estimated: 18 mL/min — ABNORMAL LOW (ref >60)
Glucose, Bld: 130 mg/dL — ABNORMAL HIGH (ref 70–99)
Potassium: 2.4 mmol/L — CL (ref 3.5–5.1)
Sodium: 139 mmol/L (ref 135–145)

## 2022-11-18 LAB — LIPID PANEL
Cholesterol: 76 mg/dL (ref 0–200)
LDL Cholesterol: 30 mg/dL (ref 0–99)
NonHDL: 49.84
Triglycerides: 100 mg/dL (ref 0.0–149.0)
VLDL: 20 mg/dL (ref 0.0–40.0)

## 2022-11-18 LAB — BRAIN NATRIURETIC PEPTIDE: B Natriuretic Peptide: 953.6 pg/mL — ABNORMAL HIGH (ref 0.0–100.0)

## 2022-11-18 LAB — MAGNESIUM: Magnesium: 2.3 mg/dL (ref 1.7–2.4)

## 2022-11-18 LAB — LDL CHOLESTEROL, DIRECT: Direct LDL: 28 mg/dL

## 2022-11-18 MED ORDER — DM-GUAIFENESIN ER 30-600 MG PO TB12: 1.0000 | ORAL_TABLET | Freq: Two times a day (BID) | ORAL | Status: DC | PRN

## 2022-11-18 MED ORDER — LATANOPROST 0.005 % OP SOLN
1.0000 [drp] | Freq: Every day | OPHTHALMIC | Status: DC
  Administered 2022-11-19: 1 [drp] via OPHTHALMIC
  Filled 2022-11-18 (×2): qty 2.5

## 2022-11-18 MED ORDER — POTASSIUM CHLORIDE CRYS ER 20 MEQ PO TBCR: 40.0000 meq | EXTENDED_RELEASE_TABLET | Freq: Once | ORAL | Status: DC

## 2022-11-18 MED ORDER — SODIUM CHLORIDE 0.9 % IV SOLN: Freq: Once | INTRAVENOUS | Status: DC

## 2022-11-18 MED ORDER — POTASSIUM CHLORIDE CRYS ER 20 MEQ PO TBCR
40.0000 meq | EXTENDED_RELEASE_TABLET | Freq: Once | ORAL | Status: AC
  Administered 2022-11-18: 40 meq via ORAL

## 2022-11-18 MED ORDER — VITAMIN D3 25 MCG (1000 UNIT) PO TABS
1000.0000 [IU] | ORAL_TABLET | Freq: Every day | ORAL | Status: DC
  Administered 2022-11-19 – 2022-11-20 (×2): 1000 [IU] via ORAL
  Filled 2022-11-18 (×4): qty 1

## 2022-11-18 MED ORDER — ALBUTEROL SULFATE (2.5 MG/3ML) 0.083% IN NEBU: 2.5000 mg | INHALATION_SOLUTION | RESPIRATORY_TRACT | Status: DC | PRN

## 2022-11-18 MED ORDER — ALBUTEROL SULFATE HFA 108 (90 BASE) MCG/ACT IN AERS: 2.0000 | INHALATION_SPRAY | RESPIRATORY_TRACT | Status: DC | PRN

## 2022-11-18 MED ORDER — PANTOPRAZOLE SODIUM 40 MG PO TBEC
40.0000 mg | DELAYED_RELEASE_TABLET | Freq: Every day | ORAL | Status: DC
  Administered 2022-11-19 – 2022-11-20 (×2): 40 mg via ORAL
  Filled 2022-11-18 (×2): qty 1

## 2022-11-18 MED ORDER — SODIUM CHLORIDE 0.9 % IV SOLN: INTRAVENOUS | Status: DC

## 2022-11-18 MED ORDER — HEPARIN SODIUM (PORCINE) 5000 UNIT/ML IJ SOLN
5000.0000 [IU] | Freq: Three times a day (TID) | INTRAMUSCULAR | Status: DC
  Administered 2022-11-18 – 2022-11-19 (×3): 5000 [IU] via SUBCUTANEOUS
  Filled 2022-11-18 (×4): qty 1

## 2022-11-18 MED ORDER — DORZOLAMIDE HCL-TIMOLOL MAL 2-0.5 % OP SOLN
1.0000 [drp] | Freq: Two times a day (BID) | OPHTHALMIC | Status: DC
  Administered 2022-11-19 – 2022-11-20 (×2): 1 [drp] via OPHTHALMIC
  Filled 2022-11-18 (×2): qty 10

## 2022-11-18 MED ORDER — CALCIUM CARBONATE 1250 (500 CA) MG PO TABS
625.0000 mg | ORAL_TABLET | Freq: Every day | ORAL | Status: DC
  Administered 2022-11-19 – 2022-11-20 (×2): 625 mg via ORAL
  Filled 2022-11-18 (×2): qty 1

## 2022-11-18 MED ORDER — MOMETASONE FURO-FORMOTEROL FUM 100-5 MCG/ACT IN AERO
2.0000 | INHALATION_SPRAY | Freq: Two times a day (BID) | RESPIRATORY_TRACT | Status: DC
  Administered 2022-11-19 – 2022-11-20 (×2): 2 via RESPIRATORY_TRACT
  Filled 2022-11-18 (×2): qty 8.8

## 2022-11-18 MED ORDER — ATORVASTATIN CALCIUM 20 MG PO TABS
20.0000 mg | ORAL_TABLET | ORAL | Status: DC
  Administered 2022-11-19: 20 mg via ORAL
  Filled 2022-11-18: qty 1

## 2022-11-18 MED ORDER — ONDANSETRON HCL 4 MG/2ML IJ SOLN: 4.0000 mg | Freq: Three times a day (TID) | INTRAMUSCULAR | Status: DC | PRN

## 2022-11-18 MED ORDER — POTASSIUM CHLORIDE CRYS ER 20 MEQ PO TBCR
40.0000 meq | EXTENDED_RELEASE_TABLET | ORAL | Status: DC
Start: 2022-11-18 — End: 2022-11-18

## 2022-11-18 MED ORDER — BRIMONIDINE TARTRATE 0.2 % OP SOLN
1.0000 [drp] | Freq: Two times a day (BID) | OPHTHALMIC | Status: DC
  Administered 2022-11-19 – 2022-11-20 (×2): 1 [drp] via OPHTHALMIC
  Filled 2022-11-18 (×2): qty 5

## 2022-11-18 MED ORDER — FERROUS SULFATE 325 (65 FE) MG PO TABS
325.0000 mg | ORAL_TABLET | Freq: Every day | ORAL | Status: DC
  Administered 2022-11-19 – 2022-11-20 (×2): 325 mg via ORAL
  Filled 2022-11-18 (×2): qty 1

## 2022-11-18 MED ORDER — HYDRALAZINE HCL 20 MG/ML IJ SOLN: 5.0000 mg | INTRAMUSCULAR | Status: DC | PRN

## 2022-11-18 NOTE — Telephone Encounter (Signed)
Have attempted to call both pt and pt's son. No answer. Left a voicemail with pt to turn our call. Son's mailbox was full.

## 2022-11-18 NOTE — Telephone Encounter (Signed)
CRITICAL VALUE STICKER  CRITICAL VALUE: BUN-103 (H)                      GFR-14.4 (L)  RECEIVER (on-site recipient of call): Charlean Merl, CMA  DATE & TIME NOTIFIED: 10:28am  MESSENGER (representative from lab): Hope  MD NOTIFIED: Dr. Patrice Paradise  TIME OF NOTIFICATION:10:28am  RESPONSE:  see note

## 2022-11-18 NOTE — H&P (Signed)
History and Physical    Eric Hicks KVQ:[id number] DOB: 1927/01/22 DOA: 11/18/2022  Referring MD/NP/PA:   PCP: Sherlene Shams, MD   Patient coming from:  The patient is coming from home.     Chief Complaint: Abnormal lab with worsening renal function  HPI: Eric Hicks is a 89 y.o. male with medical history significant of CKD-3a, HTN, HLD, COPD, dCHF, A fib not on AC, GIB, prostate cancer, BPH, anemia, who presents with abnormal lab with worsening renal function.  Pt had labs done yesterday. He was contacted by PCP today and referred to the ED for evaluation of worsening renal function.  His recent baseline creatinine 1.50 on 05/05/2022 and 2.03 on 08/18/2022.  His creatinine was 3.49 in clinic, and 3.11 in ED. patient is asymptomatic.  Denies symptoms of UTI.  No chest pain, cough, SOB.  No nausea, vomiting, diarrhea or abdominal pain.  Patient states that he is not taking Lasix intermittently.  Data reviewed independently and ED Course: pt was found to have GFR 18, potassium 2.4, magnesium 2.  3, WBC 8.3, temperature normal, blood pressure 111/79, heart rate 89, RR 17, oxygen saturation 98% on room air.  Patient is placed on telemetry bed for observation.   EKG: Not done in ED, will get one.    US-renal: There is no hydronephrosis. There is cortical thinning in the kidneys suggesting medical renal disease. Small bilateral renal stones. Small bilateral renal cysts.   Review of Systems:   General: no fevers, chills, no body weight gain, fatigue HEENT: no blurry vision, hearing changes or sore throat Respiratory: no dyspnea, coughing, wheezing CV: no chest pain, no palpitations GI: no nausea, vomiting, abdominal pain, diarrhea, constipation GU: no dysuria, burning on urination, increased urinary frequency, hematuria  Ext: no leg edema Neuro: no unilateral weakness, numbness, or tingling, no vision change or hearing loss Skin: no rash, no skin tear. MSK: No muscle spasm, no  deformity, no limitation of range of movement in spin Heme: No easy bruising.  Travel history: No recent long distant travel.   Allergy:  Allergies  Allergen Reactions   Tylenol [Acetaminophen] Rash    Past Medical History:  Diagnosis Date   BPH (benign prostatic hyperplasia)    managed by Garner Nash   COPD (chronic obstructive pulmonary disease) (HCC)    Elevated PSA, between 10 and less than 20 ng/ml    Garner Nash, watchful waiting   Hypertension    Osteoporosis    by DEXA    Past Surgical History:  Procedure Laterality Date   HIP ARTHROPLASTY Right 03/24/2022   Procedure: ARTHROPLASTY BIPOLAR HIP (HEMIARTHROPLASTY);  Surgeon: Reinaldo Berber, MD;  Location: [facility] ORS;  Service: Orthopedics;  Laterality: Right;   TYMPANOSTOMY TUBE PLACEMENT  2017    Social History:  reports that he has quit smoking. He has never used smokeless tobacco. He reports that he does not drink alcohol. No history on file for drug use.  Family History:  Family History  Problem Relation Age of Onset   Diabetes Mother    Hypertension Father    Cancer Brother 39       multiple myeloma     Prior to Admission medications   Medication Sig Start Date End Date Taking? Authorizing Provider  Aflibercept (EYLEA IO) Inject into the eye.    [provider]  AMBULATORY NON FORMULARY MEDICATION Joint Advantage Gold 2 tablets daily    [provider]  atorvastatin (LIPITOR) 20 MG tablet TAKE 1 TABLET BY MOUTH  EVERY OTHER DAY 08/23/22   Sherlene Shams, MD  brimonidine (ALPHAGAN) 0.2 % ophthalmic solution Place 1 drop into the left eye 2 (two) times daily. 09/21/22   [provider]  calcium carbonate (OS-CAL) 600 MG TABS Take 600 mg by mouth daily with breakfast.     [provider]  dorzolamide-timolol (COSOPT) 22.3-6.8 MG/ML ophthalmic solution TAKE 1 DROP(S) IN RIGHT EYE 2 TIMES A DAY 07/29/17   [provider]  ferrous sulfate 325 (65 FE) MG tablet Take 1 tablet by  mouth daily.    [provider]  fluticasone (FLONASE) 50 MCG/ACT nasal spray Place 1 spray into both nostrils daily.    [provider]  fluticasone-salmeterol (WIXELA INHUB) 100-50 MCG/ACT AEPB Inhale 1 puff into the lungs 2 (two) times daily. 11/17/22   Sherlene Shams, MD  furosemide (LASIX) 20 MG tablet TAKE 2 TABLETS BY MOUTH DAILY AS NEEDED Patient taking differently: Take 40 mg by mouth daily. 12/20/21   Eulis Foster, FNP  latanoprost (XALATAN) 0.005 % ophthalmic solution Place 1 drop into both eyes at bedtime. 03/21/18   [provider]  leuprolide, 6 Month, (ELIGARD) 45 MG injection Inject 45 mg into the skin every 6 (six) months.    [provider]  metolazone (ZAROXOLYN) 2.5 MG tablet TAKE 1 TABLET (2.5 MG TOTAL) BY MOUTH DAILY. 30 MINUTES PRIOR TO FUROSEMIDE 11/05/22   Sherlene Shams, MD  mirtazapine (REMERON) 30 MG tablet Take 1 tablet (30 mg total) by mouth at bedtime. 11/17/22   Sherlene Shams, MD  omeprazole (PRILOSEC) 40 MG capsule Take 40 mg by mouth daily. 10/29/22   [provider]  Vitamin D, Cholecalciferol, 25 MCG (1000 UT) CAPS Take 1 capsule by mouth daily.    [provider]    Physical Exam: Vitals:   11/18/22 1345 11/18/22 1400 11/18/22 1415 11/18/22 1556  BP: 108/79 111/68 111/79 135/74  Pulse: 89 69 89 84  Resp:    16  Temp:    97.6 F (36.4 C)  TempSrc:    Oral  SpO2: 95% 93% 98% 97%  Weight:      Height:       General: Not in acute distress.  Dry mucous membranes HEENT:       Eyes: PERRL, EOMI, no jaundice       ENT: No discharge from the ears and nose, no pharynx injection, no tonsillar enlargement.        Neck: No JVD, no bruit, no mass felt. Heme: No neck lymph node enlargement. Cardiac: S1/S2, RRR, No murmurs, No gallops or rubs. Respiratory: No rales, wheezing, rhonchi or rubs. GI: Soft, nondistended, nontender, no rebound pain, no organomegaly, BS present. GU: No hematuria Ext: No pitting  leg edema bilaterally. 1+DP/PT pulse bilaterally. Musculoskeletal: No joint deformities, No joint redness or warmth, no limitation of ROM in spin. Skin: No rashes.  Neuro: Alert, oriented X3, cranial nerves II-XII grossly intact, moves all extremities normally. Psych: Patient is not psychotic, no suicidal or hemocidal ideation.  Labs on Admission: I have personally reviewed following labs and imaging studies  CBC: Recent Labs  Lab 11/18/22 1332  WBC 8.3  HGB 11.5*  HCT 34.3*  MCV 96.3  PLT 224   Basic Metabolic Panel: Recent Labs  Lab 11/17/22 1557 11/18/22 1332  NA 139 139  K 3.1* 2.4*  CL 99 103  CO2 30 25  GLUCOSE 126* 130*  BUN 103* 86*  CREATININE 3.49* 3.11*  CALCIUM  8.8 8.4*  MG  --  2.3   GFR: Estimated Creatinine Clearance: 14.8 mL/min (A) (by C-G formula based on SCr of 3.11 mg/dL (H)). Liver Function Tests: Recent Labs  Lab 11/17/22 1557  AST 20  ALT 15  ALKPHOS 51  BILITOT 0.7  PROT 5.9*  ALBUMIN 3.2*   No results for input(s): "LIPASE", "AMYLASE" in the last 168 hours. No results for input(s): "AMMONIA" in the last 168 hours. Coagulation Profile: No results for input(s): "INR", "PROTIME" in the last 168 hours. Cardiac Enzymes: No results for input(s): "CKTOTAL", "CKMB", "CKMBINDEX", "TROPONINI" in the last 168 hours. BNP (last 3 results) No results for input(s): "PROBNP" in the last 8760 hours. HbA1C: Recent Labs    11/17/22 1557  HGBA1C 6.3   CBG: No results for input(s): "GLUCAP" in the last 168 hours. Lipid Profile: Recent Labs    11/17/22 1557  CHOL 76  HDL 26.40*  LDLCALC 30  TRIG 100.0  CHOLHDL 3  LDLDIRECT 28.0   Thyroid Function Tests: No results for input(s): "TSH", "T4TOTAL", "FREET4", "T3FREE", "THYROIDAB" in the last 72 hours. Anemia Panel: No results for input(s): "VITAMINB12", "FOLATE", "FERRITIN", "TIBC", "IRON", "RETICCTPCT" in the last 72 hours. Urine analysis: No results found for: "COLORURINE",  "APPEARANCEUR", "LABSPEC", "PHURINE", "GLUCOSEU", "HGBUR", "BILIRUBINUR", "KETONESUR", "PROTEINUR", "UROBILINOGEN", "NITRITE", "LEUKOCYTESUR" Sepsis Labs: @LABRCNTIP (procalcitonin:4,lacticidven:4) )No results found for this or any previous visit (from the past 240 hour(s)).   Radiological Exams on Admission: US RENAL  Result Date: 11/18/2022 CLINICAL DATA:  Renal dysfunction EXAM: RENAL / URINARY TRACT ULTRASOUND COMPLETE COMPARISON:  03/26/2022 FINDINGS: Right Kidney: Renal measurements: 10.2 x 4.9 x 4.5 cm = volume: 116.8 mL. There is no hydronephrosis. Right renal pelvis is slightly prominent without dilation of minor calices. There is 8 mm calculus in the upper pole. There is possible 7 mm cyst in the midportion. There is thinning of renal cortex. Left Kidney: Renal measurements: 10.4 x 5.2 x 3.9 cm = volume: 111.7 mL. There is no hydronephrosis. There is a 7 mm calculus in the midportion. There is a 1 cm left renal cyst. Bladder: Appears normal for degree of bladder distention. Other: Prostate is enlarged projecting into the base of the bladder. IMPRESSION: There is no hydronephrosis. There is cortical thinning in the kidneys suggesting medical renal disease. Small bilateral renal stones. Small bilateral renal cysts. Electronically Signed   By: Ernie Avena M.D.   On: 11/18/2022 18:07      Assessment/Plan Principal Problem:   Acute renal failure superimposed on stage 3a chronic kidney disease (HCC) Active Problems:   COPD (chronic obstructive pulmonary disease) with emphysema (HCC)   Chronic diastolic CHF (congestive heart failure) (HCC)   HTN (hypertension)   Paroxysmal atrial fibrillation (HCC)   Hypokalemia   Hyperlipidemia   Assessment and Plan:   Acute renal failure superimposed on stage 3a chronic kidney disease (HCC): His recent baseline creatinine 1.50 on 05/05/2022 and 2.03 on 08/18/2022.  His creatinine was 3.49 in clinic, and 3.11 in ED, BUN 86.  Patient is  asymptomatic.  No symptoms of UTI.  Possibly due to dehydration.  Renal ultrasound negative for hydronephrosis.  -Placed on telemetry bed for observation -Hold Lasix and metolazone (patient's not taking currently) -IV fluid: 1 L normal saline, then 75 cc/h  COPD (chronic obstructive pulmonary disease) with emphysema (HCC) -As needed bronchodilators  Chronic diastolic CHF (congestive heart failure) (HCC): 2D echo on 03/26/2022 showed EF of 55-60% with grade 1 diastolic dysfunction.  Patient is clinically dry. -Watch volume  status closely -Hold diuretics  HTN (hypertension): Blood pressure 111/79 -IV hydralazine as needed  Paroxysmal atrial fibrillation (HCC): Heart rate 89.  Patient is not taking anticoagulants. -Telemetry monitoring  Hypokalemia: Potassium 2.4.  Magnesium 2.3 -Give 40 mEq of potassium -Avoid overcorrection due to worsening renal function  Hyperlipidemia -Lipitor       DVT ppx: SQ Heparin    Code Status: DNR (I discussed with patient and explained the meaning of CODE STATUS. Patient wants to be DNR)  Family Communication: I have tried to call her son who did not pick up the phone.  I left a message to him.  Disposition Plan:  Anticipate discharge back to previous environment  Consults called:  none  Admission status and Level of care: Telemetry Medical:   for obs a   Dispo: The patient is from: Home              Anticipated d/c is to: Home              Anticipated d/c date is: 1 day              Patient currently is not medically stable to d/c.    Severity of Illness:  The appropriate patient status for this patient is OBSERVATION. Observation status is judged to be reasonable and necessary in order to provide the required intensity of service to ensure the patient's safety. The patient's presenting symptoms, physical exam findings, and initial radiographic and laboratory data in the context of their medical condition is felt to place them at  decreased risk for further clinical deterioration. Furthermore, it is anticipated that the patient will be medically stable for discharge from the hospital within 2 midnights of admission.        Date of Service 11/18/2022    Lorretta Harp [facility]   If 7PM-7AM, please contact night-coverage [email or web address] 11/18/2022, 7:06 PM

## 2022-11-18 NOTE — Patient Outreach (Signed)
Care Coordination   Follow Up Visit Note   11/18/2022 Name: Eric Hicks MRN: [id number] DOB: Nov 22, 1926  Eric Hicks is a 89 y.o. year old male who sees Eric Hicks, Eric Daring, MD for primary care. I spoke with  Eric Hicks by phone today.  What matters to the patients health and wellness today?  Patient reports recent loss of companion dog.  He states this has been very hard for him.  Patient reports having family support. He states his son lives next door and checks on him regularly and provides any assistance he needs.  Patient states he continued to have occasional low blood pressures/ lightheadedness and dizziness especially when waking up in the morning.  He states his provider discontinued his blood pressure medications. Patient reports most recent blood pressure readings have been:  123/80, 131/72, 95/60.  Patient reports having 2 falls within the past 2 weeks. He reports sustaining skin tear to left forearm. He states he discussed this with his primary care provider and she assessed skin tear to arm.   Patient states he is using his walker as recommended by his provider.  Patient states he has decrease appetite. He states his doctor prescribed him medication for this and he drinks a protein drink daily.  Patient states his doctor discussed need for him to consider additional help in the home. Patient agreeable to receiving call from [employer] guide and list of in home care agencies.  Patient states he has completed an Advanced directive and has a DNR order posted in his home.     Goals Addressed             This Visit's Progress    Health management/ education related to fall prevention/ macular degeneration       Interventions Today    Flowsheet Row Most Recent Value  Chronic Disease   Chronic disease during today's visit Diabetes, Hypertension (HTN), Other  [falls]  General Interventions   General Interventions Discussed/Reviewed General Interventions Reviewed, Doctor Visits,  [employer] of current treatment plan for DM, HTN, falls and patients adherence to plan as established by provider. Assessed for falls and ongoing hypotensive symptoms. Referred to community resource guide for in home care assistant list/ agencies]  Doctor Visits Discussed/Reviewed Doctor Visits Reviewed  Eric Hicks upcoming provider appointments.  Confirmed patient aware primary care provider request follow up in 1 month.]  Education Interventions   Education Provided Provided Education, Provided Printed Education  [Assessed blood pressure readings. Advised importance of ongoing monitoring and recording of blood pressures. Assess if left forearm skin tear healing. Discussed signs/ symptoms of infection. fall prevention article sent to patient.]  Provided Verbal Education On Nutrition  Mental Health Interventions   Mental Health Discussed/Reviewed Grief and Loss  [Active listening and support.]  Nutrition Interventions   Nutrition Discussed/Reviewed Nutrition Reviewed  [Encouraged patient to continue drinking nutritional protein supplements. Encouraged to stay hydrated.]  Pharmacy Interventions   Pharmacy Dicussed/Reviewed Pharmacy Topics Reviewed  [medications reviewed.  Discussed recent medication adjustment. Compliance encouraged. Confirmed patient discontinued blood pressure medications as recommended by provider.]  Safety Interventions   Safety Discussed/Reviewed Fall Risk, Safety Discussed  [Advised patient to use walker instead of cane as recommended by provider. Advised to make sure walkways in home free and clear of debris. Confirmed patient has family support.]  Advanced Directive Interventions   Advanced Directives Discussed/Reviewed Advanced Directives Discussed              SDOH assessments  and interventions completed:  No     Care Coordination Interventions:  Yes, provided   Follow up plan: Follow up call scheduled for 12/15/22    Encounter Outcome:   Pt. Visit Completed   George Ina RN,BSN,CCM [facility] Care Coordination [phone] direct line

## 2022-11-18 NOTE — ED Triage Notes (Signed)
Pt was told to come the ED, had labs drawn yesterday that showed that he was in kidney failure. Pt denies pain or any other symptoms.

## 2022-11-18 NOTE — ED Provider Notes (Signed)
[facility] Provider Note    Event Date/Time   First MD Initiated Contact with Patient 11/18/22 1310     (approximate)   History    Abnormal labs  HPI  Eric Hicks is a 89 y.o. male with a history of COPD hypertension who presents with abnormal lab value.  Patient had labs done yesterday, contacted by PCP today and referred to the ED for evaluation of acute kidney injury.  He has no pain     Physical Exam   Triage Vital Signs: ED Triage Vitals  Encounter Vitals Group     BP 11/18/22 1312 114/80     Systolic BP Percentile --      Diastolic BP Percentile --      Pulse Rate 11/18/22 1312 85     Resp 11/18/22 1312 17     Temp 11/18/22 1312 98.2 F (36.8 C)     Temp Source 11/18/22 1312 Oral     SpO2 11/18/22 1312 95 %     Weight 11/18/22 1310 72 kg (158 lb 11.7 oz)     Height 11/18/22 1310 1.778 m (5\' 10" )     Head Circumference --      Peak Flow --      Pain Score 11/18/22 1310 0     Pain Loc --      Pain Education --      Exclude from Growth Chart --     Most recent vital signs: Vitals:   11/18/22 1400 11/18/22 1415  BP: 111/68 111/79  Pulse: 69 89  Resp:    Temp:    SpO2: 93% 98%     General: Awake, no distress.  CV:  Good peripheral perfusion.  Resp:  Normal effort.  Abd:  No distention.  Other:     ED Results / Procedures / Treatments   Labs (all labs ordered are listed, but only abnormal results are displayed) Labs Reviewed  CBC - Abnormal; Notable for the following components:      Result Value   RBC 3.56 (*)    Hemoglobin 11.5 (*)    HCT 34.3 (*)    All other components within normal limits  BASIC METABOLIC PANEL - Abnormal; Notable for the following components:   Potassium 2.4 (*)    Glucose, Bld 130 (*)    BUN 86 (*)    Creatinine, Ser 3.11 (*)    Calcium 8.4 (*)    GFR, Estimated 18 (*)    All other components within normal limits     EKG     RADIOLOGY     PROCEDURES:  Critical Care  performed:   Procedures   MEDICATIONS ORDERED IN ED: Medications  0.9 %  sodium chloride infusion (has no administration in time range)  potassium chloride SA (KLOR-CON M) CR tablet 40 mEq (has no administration in time range)     IMPRESSION / MDM / ASSESSMENT AND PLAN / ED COURSE  I reviewed the triage vital signs and the nursing notes. Patient's presentation is most consistent with acute presentation with potential threat to life or bodily function.  Patient presents with reports of elevated creatinine.  Review of records from yesterday demonstrates a BUN of 103 and a creatinine of 3.49, this is contrasted with last creatinine on April 24 of 2.03  Will recheck creatinine today, will start IV fluids  Patient with potassium of 2.4, creatinine over 3, consistent with AKI, IV fluids infusing, p.o. K-Dur ordered  Have consulted  the hospitalist for admission      FINAL CLINICAL IMPRESSION(S) / ED DIAGNOSES   Final diagnoses:  Acute kidney injury (HCC)  Hypokalemia     Rx / DC Orders   ED Discharge Orders     None        Note:  This document was prepared using Dragon voice recognition software and may include unintentional dictation errors.   Jene Every, MD 11/18/22 [phone]

## 2022-11-18 NOTE — Telephone Encounter (Signed)
Pt is currently at the ED

## 2022-11-18 NOTE — Telephone Encounter (Signed)
Pt is aware and asked me to call his daughter [name]).  They are both aware of the results & daughter is out of town but handling it.

## 2022-11-18 NOTE — Patient Instructions (Addendum)
Visit Information  Thank you for taking time to visit with me today. Please don't hesitate to contact me if I can be of assistance to you.   Following are the goals we discussed today:   Goals Addressed             This Visit's Progress    Health management/ education related to fall prevention/ macular degeneration       Interventions Today    Flowsheet Row Most Recent Value  Chronic Disease   Chronic disease during today's visit Diabetes, Hypertension (HTN), Other  [falls]  General Interventions   General Interventions Discussed/Reviewed General Interventions Reviewed, Doctor Visits, [employer] of current treatment plan for DM, HTN, falls and patients adherence to plan as established by provider. Assessed for falls and ongoing hypotensive symptoms. Referred to community resource guide for in home care assistant list/ agencies]  Doctor Visits Discussed/Reviewed Doctor Visits Reviewed  Annabell Sabal upcoming provider appointments.  Confirmed patient aware primary care provider request follow up in 1 month.]  Education Interventions   Education Provided Provided Education  [Assessed blood pressure readings. Advised importance of ongoing monitoring and recording of blood pressures. Assess if left forearm skin tear healing. Discussed signs/ symptoms of infection]  Provided Verbal Education On Nutrition  Mental Health Interventions   Mental Health Discussed/Reviewed Grief and Loss  [Active listening and support.]  Nutrition Interventions   Nutrition Discussed/Reviewed Nutrition Reviewed  [Encouraged patient to continue drinking nutritional protein supplements. Encouraged to stay hydrated.]  Pharmacy Interventions   Pharmacy Dicussed/Reviewed Pharmacy Topics Reviewed  [medications reviewed.  Discussed recent medication adjustment. Compliance encouraged. Confirmed patient discontinued blood pressure medications as recommended by provider.]  Safety Interventions   Safety  Discussed/Reviewed Fall Risk, Safety Discussed  [Advised patient to use walker instead of cane as recommended by provider. Advised to make sure walkways in home free and clear of debris. Confirmed patient has family support.]  Advanced Directive Interventions   Advanced Directives Discussed/Reviewed Advanced Directives Discussed              Our next appointment is by telephone on 12/15/22 at 10 am  Please call the care guide team at [phone] if you need to cancel or reschedule your appointment.   If you are experiencing a Mental Health or Behavioral Health Crisis or need someone to talk to, please call the Suicide and Crisis Lifeline: 988 call 1-800-273-TALK (toll free, 24 hour hotline)  Patient verbalizes understanding of instructions and care plan provided today and agrees to view in MyChart. Active MyChart status and patient understanding of how to access instructions and care plan via MyChart confirmed with patient.     George Ina RN,BSN,CCM [facility] Care Coordination [phone] direct line  Fall Prevention in the Home, Adult Falls can cause injuries and can happen to people of all ages. There are many things you can do to make your home safer and to help prevent falls. What actions can I take to prevent falls? General information Use good lighting in all rooms. Make sure to: Replace any light bulbs that burn out. Turn on the lights in dark areas and use night-lights. Keep items that you use often in easy-to-reach places. Lower the shelves around your home if needed. Move furniture so that there are clear paths around it. Do not use throw rugs or other things on the floor that can make you trip. If any of your floors are uneven, fix them. Add color or contrast paint or tape to clearly  mark and help you see: Grab bars or handrails. First and last steps of staircases. Where the edge of each step is. If you use a ladder or stepladder: Make sure that it is fully opened.  Do not climb a closed ladder. Make sure the sides of the ladder are locked in place. Have someone hold the ladder while you use it. Know where your pets are as you move through your home. What can I do in the bathroom?     Keep the floor dry. Clean up any water on the floor right away. Remove soap buildup in the bathtub or shower. Buildup makes bathtubs and showers slippery. Use non-skid mats or decals on the floor of the bathtub or shower. Attach bath mats securely with double-sided, non-slip rug tape. If you need to sit down in the shower, use a non-slip stool. Install grab bars by the toilet and in the bathtub and shower. Do not use towel bars as grab bars. What can I do in the bedroom? Make sure that you have a light by your bed that is easy to reach. Do not use any sheets or blankets on your bed that hang to the floor. Have a firm chair or bench with side arms that you can use for support when you get dressed. What can I do in the kitchen? Clean up any spills right away. If you need to reach something above you, use a step stool with a grab bar. Keep electrical cords out of the way. Do not use floor polish or wax that makes floors slippery. What can I do with my stairs? Do not leave anything on the stairs. Make sure that you have a light switch at the top and the bottom of the stairs. Make sure that there are handrails on both sides of the stairs. Fix handrails that are broken or loose. Install non-slip stair treads on all your stairs if they do not have carpet. Avoid having throw rugs at the top or bottom of the stairs. Choose a carpet that does not hide the edge of the steps on the stairs. Make sure that the carpet is firmly attached to the stairs. Fix carpet that is loose or worn. What can I do on the outside of my home? Use bright outdoor lighting. Fix the edges of walkways and driveways and fix any cracks. Clear paths of anything that can make you trip, such as tools or  rocks. Add color or contrast paint or tape to clearly mark and help you see anything that might make you trip as you walk through a door, such as a raised step or threshold. Trim any bushes or trees on paths to your home. Check to see if handrails are loose or broken and that both sides of all steps have handrails. Install guardrails along the edges of any raised decks and porches. Have leaves, snow, or ice cleared regularly. Use sand, salt, or ice melter on paths if you live where there is ice and snow during the winter. Clean up any spills in your garage right away. This includes grease or oil spills. What other actions can I take? Review your medicines with your doctor. Some medicines can cause dizziness or changes in blood pressure, which increase your risk of falling. Wear shoes that: Have a low heel. Do not wear high heels. Have rubber bottoms and are closed at the toe. Feel good on your feet and fit well. Use tools that help you move around if needed. These  include: Canes. Walkers. Scooters. Crutches. Ask your doctor what else you can do to help prevent falls. This may include seeing a physical therapist to learn to do exercises to move better and get stronger. Where to find more information Centers for Disease Control and Prevention, STEAD[employer]: TonerPromos.no [employer] on Aging: BaseRingTones.pl National Institute on Aging: BaseRingTones.pl Contact a doctor if: You are afraid of falling at home. You feel weak, drowsy, or dizzy at home. You fall at home. Get help right away if you: Lose consciousness or have trouble moving after a fall. Have a fall that causes a head injury. These symptoms may be an emergency. Get help right away. Call 911. Do not wait to see if the symptoms will go away. Do not drive yourself to the hospital. This information is not intended to replace advice given to you by your health care provider. Make sure you discuss any questions you have with your health care  provider. Document Revised: 12/14/2021 Document Reviewed: 12/14/2021 Elsevier Patient Education  2024 [employer].

## 2022-11-19 DIAGNOSIS — R296 Repeated falls: Secondary | ICD-10-CM | POA: Diagnosis present

## 2022-11-19 DIAGNOSIS — I5032 Chronic diastolic (congestive) heart failure: Secondary | ICD-10-CM | POA: Diagnosis present

## 2022-11-19 DIAGNOSIS — Z8546 Personal history of malignant neoplasm of prostate: Secondary | ICD-10-CM | POA: Diagnosis not present

## 2022-11-19 DIAGNOSIS — Z79899 Other long term (current) drug therapy: Secondary | ICD-10-CM | POA: Diagnosis not present

## 2022-11-19 DIAGNOSIS — N400 Enlarged prostate without lower urinary tract symptoms: Secondary | ICD-10-CM | POA: Diagnosis present

## 2022-11-19 DIAGNOSIS — M810 Age-related osteoporosis without current pathological fracture: Secondary | ICD-10-CM | POA: Diagnosis present

## 2022-11-19 DIAGNOSIS — Z8249 Family history of ischemic heart disease and other diseases of the circulatory system: Secondary | ICD-10-CM | POA: Diagnosis not present

## 2022-11-19 DIAGNOSIS — E785 Hyperlipidemia, unspecified: Secondary | ICD-10-CM | POA: Diagnosis present

## 2022-11-19 DIAGNOSIS — Z807 Family history of other malignant neoplasms of lymphoid, hematopoietic and related tissues: Secondary | ICD-10-CM | POA: Diagnosis not present

## 2022-11-19 DIAGNOSIS — I130 Hypertensive heart and chronic kidney disease with heart failure and stage 1 through stage 4 chronic kidney disease, or unspecified chronic kidney disease: Secondary | ICD-10-CM | POA: Diagnosis present

## 2022-11-19 DIAGNOSIS — Z886 Allergy status to analgesic agent status: Secondary | ICD-10-CM | POA: Diagnosis not present

## 2022-11-19 DIAGNOSIS — Z833 Family history of diabetes mellitus: Secondary | ICD-10-CM | POA: Diagnosis not present

## 2022-11-19 DIAGNOSIS — Z87891 Personal history of nicotine dependence: Secondary | ICD-10-CM | POA: Diagnosis not present

## 2022-11-19 DIAGNOSIS — N179 Acute kidney failure, unspecified: Secondary | ICD-10-CM | POA: Diagnosis not present

## 2022-11-19 DIAGNOSIS — N1831 Chronic kidney disease, stage 3a: Secondary | ICD-10-CM | POA: Diagnosis not present

## 2022-11-19 DIAGNOSIS — Z96641 Presence of right artificial hip joint: Secondary | ICD-10-CM | POA: Diagnosis present

## 2022-11-19 DIAGNOSIS — S51812A Laceration without foreign body of left forearm, initial encounter: Secondary | ICD-10-CM | POA: Insufficient documentation

## 2022-11-19 DIAGNOSIS — I480 Paroxysmal atrial fibrillation: Secondary | ICD-10-CM | POA: Diagnosis present

## 2022-11-19 DIAGNOSIS — N200 Calculus of kidney: Secondary | ICD-10-CM | POA: Diagnosis present

## 2022-11-19 DIAGNOSIS — N281 Cyst of kidney, acquired: Secondary | ICD-10-CM | POA: Diagnosis present

## 2022-11-19 DIAGNOSIS — Z66 Do not resuscitate: Secondary | ICD-10-CM | POA: Diagnosis present

## 2022-11-19 DIAGNOSIS — J439 Emphysema, unspecified: Secondary | ICD-10-CM | POA: Diagnosis present

## 2022-11-19 DIAGNOSIS — E876 Hypokalemia: Secondary | ICD-10-CM | POA: Diagnosis present

## 2022-11-19 LAB — BASIC METABOLIC PANEL
Anion gap: 10 (ref 5–15)
BUN: 89 mg/dL — ABNORMAL HIGH (ref 8–23)
BUN: 83 mg/dL — ABNORMAL HIGH (ref 8–23)
CO2: 25 mmol/L (ref 22–32)
Calcium: 8.1 mg/dL — ABNORMAL LOW (ref 8.9–10.3)
Chloride: 105 mmol/L (ref 98–111)
Creatinine, Ser: 2.51 mg/dL — ABNORMAL HIGH (ref 0.61–1.24)
GFR, Estimated: 23 mL/min — ABNORMAL LOW (ref >60)
Glucose, Bld: 115 mg/dL — ABNORMAL HIGH (ref 70–99)
Potassium: 3 mmol/L — ABNORMAL LOW (ref 3.5–5.1)
Sodium: 140 mmol/L (ref 135–145)

## 2022-11-19 MED ORDER — POTASSIUM CHLORIDE 10 MEQ/100ML IV SOLN
10.0000 meq | INTRAVENOUS | Status: AC
  Administered 2022-11-19 (×3): 10 meq via INTRAVENOUS
  Filled 2022-11-19 (×2): qty 100

## 2022-11-19 MED ORDER — POTASSIUM CHLORIDE 10 MEQ/100ML IV SOLN
10.0000 meq | INTRAVENOUS | Status: AC
  Administered 2022-11-19 (×4): 10 meq via INTRAVENOUS
  Filled 2022-11-19 (×3): qty 100

## 2022-11-19 NOTE — Assessment & Plan Note (Signed)
Secondary to fall.  Not infected,  inadequate dressing however increases his risk of infection  .  Dressing change made return weekly until resolve.d

## 2022-11-19 NOTE — Assessment & Plan Note (Signed)
Secondary to failtre to thrive , weight loss and muscle wasting >  He prefers to remain at home ;  will need PT

## 2022-11-19 NOTE — Assessment & Plan Note (Signed)
well-controlled on diet alone .  hemoglobin A1c has been consistently at or  less than 7.0 . Patient is up-to-date on eye exams and foot exam is normal today.  Patient is tolerating statin therapy for or CAD risk reduction  but not taking and ARB due to hypotension  Lab Results  Component Value Date   HGBA1C 6.3 11/17/2022

## 2022-11-19 NOTE — Evaluation (Signed)
Occupational Therapy Evaluation Patient Details Name: Eric Hicks MRN: [id number] DOB: September 30, 1926 Today's Date: 11/19/2022   History of Present Illness 89 y/o male presented to ED on 11/18/22 for abnormal labs showing kidney failure. PMH: CKD-3a, HTN, HLD, COPD, dCHF, A fib not on AC, GIB, prostate cancer, BPH, anemia   Clinical Impression   Pt was seen for OT evaluation this date. Prior to hospital admission, pt was living by himself with son and daughter in law living next door. Pt/family endorse pt tends to hold onto furniture in the house for stability and has several recent falls 2/2 LOB. Pt takes a seated shower and is generally indep with dressing (velcro shoes). Pt sometimes uses a rollator versus SPC for community mobility and is independent with med mgt using a pill organizer. Pt presents to acute OT demonstrating impaired ADL performance and functional mobility 2/2 decreased strength, activity tolerance, and balance (See OT problem list for additional functional deficits). Pt currently requires CGA for ADL mobility with 2WW and PRN MIN A For LB ADL tasks. Pt/family educated in falls prevention and AE/DME for ADL tasks. Pt/family verbalized understanding. Son notes he plans to purchase a new 2WW promptly for pt to use inside the home. Pt would benefit from skilled OT services while hospitalized to address noted impairments and functional limitations (see below for any additional details) in order to maximize safety and independence while minimizing falls risk and caregiver burden.     Recommendations for follow up therapy are one component of a multi-disciplinary discharge planning process, led by the attending physician.  Recommendations may be updated based on patient status, additional functional criteria and insurance authorization.   Assistance Recommended at Discharge Intermittent Supervision/Assistance  Patient can return home with the following A little help with walking and/or  transfers;A little help with bathing/dressing/bathroom;Assistance with cooking/housework;Assist for transportation;Help with stairs or ramp for entrance    Functional Status Assessment  Patient has had a recent decline in their functional status and demonstrates the ability to make significant improvements in function in a reasonable and predictable amount of time.  Equipment Recommendations  Other (comment) (son plans to purchase 2WW himself)    Recommendations for Other Services       Precautions / Restrictions Precautions Precautions: Fall Restrictions Weight Bearing Restrictions: No      Mobility Bed Mobility Overal bed mobility: Needs Assistance Bed Mobility: Supine to Sit, Sit to Supine     Supine to sit: Min guard Sit to supine: Min guard        Transfers Overall transfer level: Needs assistance Equipment used: Rolling walker (2 wheels) Transfers: Sit to/from Stand Sit to Stand: Min guard                  Balance Overall balance assessment: Mild deficits observed, not formally tested, History of Falls                                         ADL either performed or assessed with clinical judgement   ADL                                         General ADL Comments: PRN MIN A For LB ADL tasks, CGA for ADL transfers with RW.     Vision  Perception     Praxis      Pertinent Vitals/Pain Pain Assessment Pain Assessment: No/denies pain     Hand Dominance     Extremity/Trunk Assessment Upper Extremity Assessment Upper Extremity Assessment: Generalized weakness   Lower Extremity Assessment Lower Extremity Assessment: Generalized weakness   Cervical / Trunk Assessment Cervical / Trunk Assessment: Kyphotic   Communication Communication Communication: HOH   Cognition Arousal/Alertness: Awake/alert Behavior During Therapy: WFL for tasks assessed/performed Overall Cognitive Status: Within Functional  Limits for tasks assessed                                       General Comments       Exercises Other Exercises Other Exercises: Pt/family educated in falls prevention and AE/DME for ADL tasks   Shoulder Instructions      Home Living Family/patient expects to be discharged to:: Private residence Living Arrangements: Alone Available Help at Discharge: Family;Available PRN/intermittently Type of Home: House Home Access: Stairs to enter [employer] of Steps: 4 Entrance Stairs-Rails: Right;Left;Can reach both Home Layout: Two level;Able to live on main level with bedroom/bathroom               Home Equipment: Rollator (4 wheels);Cane - single point;Grab bars - tub/shower;Hand held shower head;Shower seat;Adaptive equipment Adaptive Equipment: Long-handled shoe horn Additional Comments: son plans to purchase a 2WW from [facility] store today      Prior Functioning/Environment Prior Level of Function : Independent/Modified Independent;History of Falls (last six months)             Mobility Comments: uses rollator outdoors and furniture walks in the home. Reports ~3 falls in past 6 months ADLs Comments: pt mod indep with seated shower, light meal prep, pt mod indep with med mgt using pill box, takes his own vitals and weights daily and records in spreadsheet        OT Problem List: Decreased strength;Decreased activity tolerance;Decreased knowledge of use of DME or AE;Impaired balance (sitting and/or standing);Decreased safety awareness      OT Treatment/Interventions: Self-care/ADL training;Therapeutic exercise;Therapeutic activities;DME and/or AE instruction;Patient/family education;Balance training    OT Goals(Current goals can be found in the care plan section) Acute Rehab OT Goals Patient Stated Goal: go home and not fall OT Goal Formulation: With patient/family Time For Goal Achievement: 12/03/22 Potential to Achieve Goals:  Good ADL Goals Additional ADL Goal #1: Pt will complete all aspects of showering with modified independence, 1/1 opportunity. Additional ADL Goal #2: Pt will negotiate kitchen environment to complete light meal prep, LRAD, and no LOB, 5/5 opportunities. Additional ADL Goal #3: Pt will complete all aspects of dressing with modified independence, 2/2 opportunities.  OT Frequency: Min 1X/week    Co-evaluation              AM-PAC OT "6 Clicks" Daily Activity     Outcome Measure Help from another person eating meals?: None Help from another person taking care of personal grooming?: None Help from another person toileting, which includes using toliet, bedpan, or urinal?: A Little Help from another person bathing (including washing, rinsing, drying)?: A Little Help from another person to put on and taking off regular upper body clothing?: None Help from another person to put on and taking off regular lower body clothing?: A Little 6 Click Score: 21   End of Session    Activity Tolerance: Patient tolerated treatment well Patient  left: in bed;with call bell/phone within reach;with bed alarm set;with family/visitor present  OT Visit Diagnosis: Other abnormalities of gait and mobility (R26.89);Repeated falls (R29.6);Muscle weakness (generalized) (M62.81)                Time: 1610-9604 OT Time Calculation (min): 28 min Charges:  OT General Charges $OT Visit: 1 Visit OT Evaluation $OT Eval Low Complexity: 1 Low OT Treatments $Self Care/Home Management : 8-22 mins  Arman Filter., MPH, MS, OTR/L ascom [phone] 11/19/22, 4:20 PM

## 2022-11-19 NOTE — Assessment & Plan Note (Signed)
Secondary t opoor po intake and resultant weight loss.GFR is < 20 ml/min and BUN is > 100/    Sending to ER for admission

## 2022-11-19 NOTE — Progress Notes (Signed)
PROGRESS NOTE    Eric Hicks   WUJ:[id number] DOB: 02-16-1927  DOA: 11/18/2022 Date of Service: 11/19/22 PCP: Sherlene Shams, MD     Brief Narrative / Hospital Course:  Eric Hicks is a 89 y.o. male with medical history significant of CKD-3a, HTN, HLD, COPD, dCHF, A fib not on AC, GIB, prostate cancer, BPH, anemia, who presents to ED 11/18/22 at the instruction of PCP after labs (routine done at 3-mo f/u visit) revealed worsening renal function. PCP notes reviewed - also concern for weight loss 25 lbs x6 mos, frequent falls, weakness, grief and wt loss attributed somewhat to recent death of his dog few days ago.  07/25: admitted for AKI and hypokalemia. Cr 3.49, renal US obtained, medical renal disease.  07/26: Cr 2.71 and K 2.5 this morning --> Cr 2.5 and K 3.0 mid-day. Continue IV fluids   Consultants:  none  Procedures: none      ASSESSMENT & PLAN:   Principal Problem:   Acute renal failure superimposed on stage 3a chronic kidney disease (HCC) Active Problems:   COPD (chronic obstructive pulmonary disease) with emphysema (HCC)   Chronic diastolic CHF (congestive heart failure) (HCC)   HTN (hypertension)   Paroxysmal atrial fibrillation (HCC)   Hypokalemia   Hyperlipidemia   Acute renal failure superimposed on stage 3a chronic kidney disease (HCC):  recent baseline creatinine 1.50 on 05/05/2022 and 2.03 on 08/18/2022.  His creatinine was 3.49 in clinic, and 3.11 in ED, BUN 86.  Possibly due to dehydration.  Renal ultrasound negative for hydronephrosis. Hold Lasix and metolazone (patient's not taking currently) IV fluid normal saline 75 cc/h   COPD (chronic obstructive pulmonary disease) with emphysema (HCC) As needed bronchodilators   Chronic diastolic CHF (congestive heart failure) (HCC): 2D echo on 03/26/2022 showed EF of 55-60% with grade 1 diastolic dysfunction.  Patient is clinically dry. Watch volume status closely on IV fluids Hold diuretics   Essential  HTN (hypertension):  Blood pressure 111/79 IV hydralazine as needed   Paroxysmal atrial fibrillation (HCC):  Heart rate 89.  Patient is not taking anticoagulants. Telemetry monitoring   Hypokalemia:  Replace as needed Monitor BMP   Hyperlipidemia Lipitor     DVT prophylaxis: heparin Pertinent IV fluids/nutrition: IV fluids as above for AKI Central lines / invasive devices: none  Code Status: DNR ACP documentation reviewed: 11/19/22 - living will on file   Current Admission Status: observation - may need to transition to inpatient   TOC needs / Dispo plan: TBD, will have PT/OT assess given weakness Barriers to discharge / significant pending items: renal function, hypokalemia, expect will be ok to dischare tomorrow              Subjective / Brief ROS:  Patient reports no concerns Feeling better today Denies CP/SOB.  Pain controlled.  Denies new weakness.  Tolerating diet but low appetite.  Reports no concerns w/ urination/defecation.   Family Communication: spoke on phone w/ daughter on speakerphone at bedside rounds     Objective Findings:  Vitals:   11/18/22 2015 11/19/22 0538 11/19/22 0740 11/19/22 1520  BP: (!) 89/61 116/76 114/78 118/86  Pulse: 71 66 85 63  Resp: 18 20 16 18   Temp: 98.2 F (36.8 C) 97.9 F (36.6 C) 97.6 F (36.4 C) 97.7 F (36.5 C)  TempSrc: Oral Oral Oral Oral  SpO2: 96% 95% 95% 99%  Weight:      Height:        Intake/Output Summary (Last  24 hours) at 11/19/2022 1523 Last data filed at 11/19/2022 1312 Gross per 24 hour  Intake 240 ml  Output 775 ml  Net -535 ml   Filed Weights   11/18/22 1310  Weight: 72 kg    Examination:  Physical Exam Constitutional:      General: He is not in acute distress.    Appearance: Normal appearance.  Cardiovascular:     Rate and Rhythm: Normal rate and regular rhythm.     Heart sounds: Normal heart sounds.  Pulmonary:     Effort: Pulmonary effort is normal.     Breath sounds:  Normal breath sounds.  Abdominal:     General: Abdomen is flat.     Palpations: Abdomen is soft.  Musculoskeletal:     Right lower leg: No edema.     Left lower leg: No edema.  Neurological:     Mental Status: He is alert.          Scheduled Medications:   atorvastatin  20 mg Oral QODAY   brimonidine  1 drop Left Eye BID   calcium carbonate  625 mg Oral Q breakfast   cholecalciferol  1,000 Units Oral Daily   dorzolamide-timolol  1 drop Both Eyes BID   ferrous sulfate  325 mg Oral Q breakfast   heparin  5,000 Units Subcutaneous Q8H   latanoprost  1 drop Both Eyes QHS   mometasone-formoterol  2 puff Inhalation BID   pantoprazole  40 mg Oral Daily    Continuous Infusions:  sodium chloride     sodium chloride 75 mL/hr at 11/19/22 0541   potassium chloride      PRN Medications:  albuterol, dextromethorphan-guaiFENesin, hydrALAZINE, ondansetron (ZOFRAN) IV  Antimicrobials from admission:  Anti-infectives (From admission, onward)    None           Data Reviewed:  I have personally reviewed the following...  CBC: Recent Labs  Lab 11/18/22 1332 11/19/22 0456  WBC 8.3 8.8  HGB 11.5* 10.5*  HCT 34.3* 30.2*  MCV 96.3 95.9  PLT 224 216   Basic Metabolic Panel: Recent Labs  Lab 11/17/22 1557 11/18/22 1332 11/19/22 0456 11/19/22 1211  NA 139 139 140 140  K 3.1* 2.4* 2.5* 3.0*  CL 99 103 105 105  CO2 30 25 26 25   GLUCOSE 126* 130* 110* 115*  BUN 103* 86* 89* 83*  CREATININE 3.49* 3.11* 2.71* 2.51*  CALCIUM 8.8 8.4* 8.1* 8.1*  MG  --  2.3  --   --    GFR: Estimated Creatinine Clearance: 18.3 mL/min (A) (by C-G formula based on SCr of 2.51 mg/dL (H)). Liver Function Tests: Recent Labs  Lab 11/17/22 1557  AST 20  ALT 15  ALKPHOS 51  BILITOT 0.7  PROT 5.9*  ALBUMIN 3.2*   No results for input(s): "LIPASE", "AMYLASE" in the last 168 hours. No results for input(s): "AMMONIA" in the last 168 hours. Coagulation Profile: No results for  input(s): "INR", "PROTIME" in the last 168 hours. Cardiac Enzymes: No results for input(s): "CKTOTAL", "CKMB", "CKMBINDEX", "TROPONINI" in the last 168 hours. BNP (last 3 results) No results for input(s): "PROBNP" in the last 8760 hours. HbA1C: Recent Labs    11/17/22 1557  HGBA1C 6.3   CBG: No results for input(s): "GLUCAP" in the last 168 hours. Lipid Profile: Recent Labs    11/17/22 1557  CHOL 76  HDL 26.40*  LDLCALC 30  TRIG 100.0  CHOLHDL 3  LDLDIRECT 28.0   Thyroid Function Tests:  No results for input(s): "TSH", "T4TOTAL", "FREET4", "T3FREE", "THYROIDAB" in the last 72 hours. Anemia Panel: No results for input(s): "VITAMINB12", "FOLATE", "FERRITIN", "TIBC", "IRON", "RETICCTPCT" in the last 72 hours. Most Recent Urinalysis On File:  No results found for: "COLORURINE", "APPEARANCEUR", "LABSPEC", "PHURINE", "GLUCOSEU", "HGBUR", "BILIRUBINUR", "KETONESUR", "PROTEINUR", "UROBILINOGEN", "NITRITE", "LEUKOCYTESUR" Sepsis Labs: @LABRCNTIP (procalcitonin:4,lacticidven:4) Microbiology: No results found for this or any previous visit (from the past 240 hour(s)).    Radiology Studies last 3 days: US RENAL  Result Date: 11/18/2022 CLINICAL DATA:  Renal dysfunction EXAM: RENAL / URINARY TRACT ULTRASOUND COMPLETE COMPARISON:  03/26/2022 FINDINGS: Right Kidney: Renal measurements: 10.2 x 4.9 x 4.5 cm = volume: 116.8 mL. There is no hydronephrosis. Right renal pelvis is slightly prominent without dilation of minor calices. There is 8 mm calculus in the upper pole. There is possible 7 mm cyst in the midportion. There is thinning of renal cortex. Left Kidney: Renal measurements: 10.4 x 5.2 x 3.9 cm = volume: 111.7 mL. There is no hydronephrosis. There is a 7 mm calculus in the midportion. There is a 1 cm left renal cyst. Bladder: Appears normal for degree of bladder distention. Other: Prostate is enlarged projecting into the base of the bladder. IMPRESSION: There is no hydronephrosis. There  is cortical thinning in the kidneys suggesting medical renal disease. Small bilateral renal stones. Small bilateral renal cysts. Electronically Signed   By: Ernie Avena M.D.   On: 11/18/2022 18:07             LOS: 0 days       Sunnie Nielsen, DO [facility] 11/19/2022, 3:23 PM    Dictation software may have been used to generate the above note. Typos may occur and escape review in typed/dictated notes. Please contact Dr Lyn Hollingshead directly for clarity if needed.  Staff may message me via secure chat in Epic  but this may not receive an immediate response,  please page me for urgent matters!  If 7PM-7AM, please contact night coverage [email or web address]

## 2022-11-19 NOTE — Assessment & Plan Note (Signed)
Has been taking iron supplement since January. With improvement in hgb and resolution of iron deficiency   Lab Results  Component Value Date   WBC 8.8 11/19/2022   HGB 10.5 (L) 11/19/2022   HCT 30.2 (L) 11/19/2022   MCV 95.9 11/19/2022   PLT 216 11/19/2022   Lab Results  Component Value Date   IRON 76 08/18/2022   TIBC 264.6 08/18/2022   FERRITIN 63.4 08/18/2022

## 2022-11-19 NOTE — Hospital Course (Addendum)
Eric Hicks is a 89 y.o. male with medical history significant of CKD-3a, HTN, HLD, COPD, dCHF, A fib not on AC, GIB, prostate cancer, BPH, anemia, who presents to ED 11/18/22 at the instruction of PCP after labs (routine done at 3-mo f/u visit) revealed worsening renal function. PCP notes reviewed - also concern for weight loss 25 lbs x6 mos, frequent falls, weakness, grief and wt loss attributed somewhat to recent death of his dog few days ago.  07/25: admitted for AKI and hypokalemia. Cr 3.49, renal US obtained, medical renal disease.  07/26: Cr 2.71 and K 2.5 this morning --> Cr 2.5 and K 3.0 mid-day. Continue IV fluids   Consultants:  none  Procedures: none      ASSESSMENT & PLAN:   Principal Problem:   Acute renal failure superimposed on stage 3a chronic kidney disease (HCC) Active Problems:   COPD (chronic obstructive pulmonary disease) with emphysema (HCC)   Chronic diastolic CHF (congestive heart failure) (HCC)   HTN (hypertension)   Paroxysmal atrial fibrillation (HCC)   Hypokalemia   Hyperlipidemia   Acute renal failure superimposed on stage 3a chronic kidney disease (HCC):  recent baseline creatinine 1.50 on 05/05/2022 and 2.03 on 08/18/2022.  His creatinine was 3.49 in clinic, and 3.11 in ED, BUN 86.  Possibly due to dehydration.  Renal ultrasound negative for hydronephrosis. Hold Lasix and metolazone (patient's not taking currently) IV fluid normal saline 75 cc/h   COPD (chronic obstructive pulmonary disease) with emphysema (HCC) As needed bronchodilators   Chronic diastolic CHF (congestive heart failure) (HCC): 2D echo on 03/26/2022 showed EF of 55-60% with grade 1 diastolic dysfunction.  Patient is clinically dry. Watch volume status closely on IV fluids Hold diuretics   Essential HTN (hypertension):  Blood pressure 111/79 IV hydralazine as needed   Paroxysmal atrial fibrillation (HCC):  Heart rate 89.  Patient is not taking anticoagulants. Telemetry  monitoring   Hypokalemia:  Replace as needed Monitor BMP   Hyperlipidemia Lipitor     DVT prophylaxis: heparin Pertinent IV fluids/nutrition: IV fluids as above for AKI Central lines / invasive devices: none  Code Status: DNR ACP documentation reviewed: 11/19/22 - living will on file   Current Admission Status: observation - may need to transition to inpatient   TOC needs / Dispo plan: TBD, will have PT/OT assess given weakness Barriers to discharge / significant pending items: renal function, hypokalemia, expect will be ok to dischare tomorrow

## 2022-11-19 NOTE — Evaluation (Signed)
Physical Therapy Evaluation Patient Details Name: Eric Hicks MRN: [id number] DOB: 12/06/1926 Today's Date: 11/19/2022  History of Present Illness  89 y/o male presented to ED on 11/18/22 for abnormal labs showing kidney failure. PMH: CKD-3a, HTN, HLD, COPD, dCHF, A fib not on AC, GIB, prostate cancer, BPH, anemia  Clinical Impression  Patient admitted with the above. PTA, patient lives alone and reports independence with use of rollator outdoors and furniture walking in the home. Endorses ~3 falls in past 6 months, family endorses >3. Patient presents with weakness, impaired balance, and decreased activity tolerance. Able to ambulate in the hallway with use of RW and min guard progressing to supervision. Education provided to family and patient about use of rollator in/around the home for safety and to reduce potential fall risk, both verbalized understanding. Discussed HH vs OP therapy follow up. Patient will benefit from skilled PT services during acute stay to address listed deficits. Patient will benefit from ongoing therapy at discharge to maximize functional independence and safety.         Assistance Recommended at Discharge Set up Supervision/Assistance  If plan is discharge home, recommend the following:  Can travel by private vehicle  A little help with walking and/or transfers;A little help with bathing/dressing/bathroom;Assistance with cooking/housework;Help with stairs or ramp for entrance;Assist for transportation        Equipment Recommendations None recommended by PT  Recommendations for Other Services       Functional Status Assessment Patient has had a recent decline in their functional status and demonstrates the ability to make significant improvements in function in a reasonable and predictable amount of time.     Precautions / Restrictions Precautions Precautions: Fall Restrictions Weight Bearing Restrictions: No      Mobility  Bed Mobility Overal bed  mobility: Needs Assistance Bed Mobility: Supine to Sit     Supine to sit: Min guard     General bed mobility comments: use of therapist hand to pull self up into sitting.    Transfers Overall transfer level: Needs assistance Equipment used: Rolling Delaney Schnick (2 wheels) Transfers: Sit to/from Stand Sit to Stand: Min guard           General transfer comment: min guard for safety.    Ambulation/Gait Ambulation/Gait assistance: Min guard, Supervision Gait Distance (Feet): 200 Feet Assistive device: Rolling Odean Mcelwain (2 wheels) Gait Pattern/deviations: Step-through pattern, Decreased stride length, Trunk flexed Gait velocity: decreased     General Gait Details: initially min guard progressing to supervision for safety. No overt LOB but generally unsteady.  Stairs            Wheelchair Mobility     Tilt Bed    Modified Rankin (Stroke Patients Only)       Balance Overall balance assessment: Mild deficits observed, not formally tested, History of Falls                                           Pertinent Vitals/Pain Pain Assessment Pain Assessment: No/denies pain    Home Living Family/patient expects to be discharged to:: Private residence Living Arrangements: Alone Available Help at Discharge: Family Type of Home: House Home Access: Stairs to enter Entrance Stairs-Rails: Right;Left;Can reach both Entrance Stairs-Number of Steps: 4   Home Layout: Two level;Able to live on main level with bedroom/bathroom Home Equipment: Rollator (4 wheels);Cane - single point  Prior Function Prior Level of Function : Independent/Modified Independent;History of Falls (last six months)             Mobility Comments: uses rollator outdoors and furniture walks in the home. Reports ~3 falls in past 6 months       Hand Dominance        Extremity/Trunk Assessment   Upper Extremity Assessment Upper Extremity Assessment: Defer to OT evaluation     Lower Extremity Assessment Lower Extremity Assessment: Generalized weakness    Cervical / Trunk Assessment Cervical / Trunk Assessment: Kyphotic  Communication   Communication: HOH  Cognition Arousal/Alertness: Awake/alert Behavior During Therapy: WFL for tasks assessed/performed Overall Cognitive Status: Within Functional Limits for tasks assessed                                          General Comments      Exercises     Assessment/Plan    PT Assessment Patient needs continued PT services  PT Problem List Decreased strength;Decreased activity tolerance;Decreased balance;Decreased mobility;Decreased knowledge of use of DME;Decreased safety awareness       PT Treatment Interventions DME instruction;Gait training;Functional mobility training;Therapeutic activities;Therapeutic exercise;Balance training;Stair training;Patient/family education    PT Goals (Current goals can be found in the Care Plan section)  Acute Rehab PT Goals Patient Stated Goal: to go home PT Goal Formulation: With patient/family Time For Goal Achievement: 12/03/22 Potential to Achieve Goals: Good    Frequency Min 1X/week     Co-evaluation               AM-PAC PT "6 Clicks" Mobility  Outcome Measure Help needed turning from your back to your side while in a flat bed without using bedrails?: A Little Help needed moving from lying on your back to sitting on the side of a flat bed without using bedrails?: A Little Help needed moving to and from a bed to a chair (including a wheelchair)?: A Little Help needed standing up from a chair using your arms (e.g., wheelchair or bedside chair)?: A Little Help needed to walk in hospital room?: A Little Help needed climbing 3-5 steps with a railing? : A Little 6 Click Score: 18    End of Session Equipment Utilized During Treatment: Gait belt Activity Tolerance: Patient tolerated treatment well Patient left: in bed;with call  bell/phone within reach;with family/visitor present (seated EOB with son assisting clothing change) Nurse Communication: Mobility status PT Visit Diagnosis: Unsteadiness on feet (R26.81);History of falling (Z91.81);Muscle weakness (generalized) (M62.81)    Time: 1610-9604 PT Time Calculation (min) (ACUTE ONLY): 25 min   Charges:   PT Evaluation $PT Eval Low Complexity: 1 Low PT Treatments $Therapeutic Activity: 8-22 mins PT General Charges $$ ACUTE PT VISIT: 1 Visit         Maylon Peppers, PT, DPT Physical Therapist - [facility]  [facility]   Yiselle Babich A Makayia Duplessis 11/19/2022, 3:25 PM

## 2022-11-19 NOTE — Progress Notes (Signed)
       CROSS COVER NOTE  NAME: Eric Hicks MRN: [id number] DOB : Mar 06, 1927    Concern as stated by nurse / staff    [name] Pt in Rm 206 admit with AKI and abnormal labs. Pt has a critical Potassium of 2.5 this AM. Please advise. Thanks     Pertinent findings on chart review: VSS, creatinine with mild improvement 2.71 from 3.11  Assessment and  Interventions   Assessment:  Plan: Potassium 10 meq runs x 4 IV       Donnie Mesa NP [facility] Cross Cover 7pm-7am - check amion for availability Pager [phone]

## 2022-11-20 DIAGNOSIS — N1831 Chronic kidney disease, stage 3a: Secondary | ICD-10-CM | POA: Diagnosis not present

## 2022-11-20 DIAGNOSIS — N179 Acute kidney failure, unspecified: Secondary | ICD-10-CM | POA: Diagnosis not present

## 2022-11-20 LAB — BASIC METABOLIC PANEL
Anion gap: 9 (ref 5–15)
BUN: 66 mg/dL — ABNORMAL HIGH (ref 8–23)
CO2: 25 mmol/L (ref 22–32)
Calcium: 8.3 mg/dL — ABNORMAL LOW (ref 8.9–10.3)
Chloride: 106 mmol/L (ref 98–111)
Creatinine, Ser: 2.13 mg/dL — ABNORMAL HIGH (ref 0.61–1.24)
GFR, Estimated: 28 mL/min — ABNORMAL LOW (ref >60)
Glucose, Bld: 110 mg/dL — ABNORMAL HIGH (ref 70–99)
Potassium: 3.8 mmol/L (ref 3.5–5.1)
Sodium: 140 mmol/L (ref 135–145)

## 2022-11-20 LAB — GLUCOSE, CAPILLARY: Glucose-Capillary: 107 mg/dL — ABNORMAL HIGH (ref 70–99)

## 2022-11-20 MED ORDER — POTASSIUM CHLORIDE CRYS ER 20 MEQ PO TBCR
40.0000 meq | EXTENDED_RELEASE_TABLET | Freq: Once | ORAL | Status: AC
  Administered 2022-11-20: 40 meq via ORAL
  Filled 2022-11-20: qty 2

## 2022-11-20 MED ORDER — POTASSIUM CHLORIDE CRYS ER 10 MEQ PO TBCR
20.0000 meq | EXTENDED_RELEASE_TABLET | Freq: Every day | ORAL | 0 refills | Status: DC
Start: 2022-11-20 — End: 2022-12-22

## 2022-11-20 NOTE — Progress Notes (Signed)
Physical Therapy Treatment Patient Details Name: Eric Hicks MRN: [id number] DOB: 02-Nov-1926 Today's Date: 11/20/2022   History of Present Illness 89 y/o male presented to ED on 11/18/22 for abnormal labs showing kidney failure. PMH: CKD-3a, HTN, HLD, COPD, dCHF, A fib not on AC, GIB, prostate cancer, BPH, anemia    PT Comments  Pt was long sitting in bed upon arrival. He is A and agreeable. Easily and safely able to exit bed, stand, and ambulate with RW. Safely perform stairs to simulate home entry. Author discussed post acute care after DC. Son lives next door and feels safe for pt to return home and follow up with OPPT. He will benefit from continued PT to focus on balance and safety with all ADLs.    Assistance Recommended at Discharge PRN  If plan is discharge home, recommend the following:  Can travel by private vehicle    A little help with walking and/or transfers;A little help with bathing/dressing/bathroom;Assistance with cooking/housework;Help with stairs or ramp for entrance;Assist for transportation      Equipment Recommendations  None recommended by PT       Precautions / Restrictions Precautions Precautions: Fall Restrictions Weight Bearing Restrictions: No     Mobility  Bed Mobility Overal bed mobility: Needs Assistance Bed Mobility: Supine to Sit, Sit to Supine Supine to sit: Supervision   Transfers Overall transfer level: Needs assistance Equipment used: Rolling walker (2 wheels) Transfers: Sit to/from Stand Sit to Stand: Supervision   Ambulation/Gait Ambulation/Gait assistance: Supervision Gait Distance (Feet): 200 Feet Assistive device: Rolling walker (2 wheels) Gait Pattern/deviations: Step-through pattern Gait velocity: WNL  General Gait Details: pt demonstrates safe steady gait without LOB. Pt uses rollator at baseline   Stairs Stairs: Yes Stairs assistance: Supervision Stair Management: Two rails, Alternating pattern, Forwards Number of  Stairs: 4        Cognition Arousal/Alertness: Awake/alert Behavior During Therapy: WFL for tasks assessed/performed Overall Cognitive Status: Within Functional Limits for tasks assessed    General Comments: Pt A and agreeable        Exercises Other Exercises Other Exercises: called pt's son and clarified post acute care. Pt and family would liek to do OPPT to focus on balance and safety with ADLs    General Comments        Pertinent Vitals/Pain Pain Assessment Pain Assessment: No/denies pain     PT Goals (current goals can now be found in the care plan section) Acute Rehab PT Goals Patient Stated Goal: go home today Progress towards PT goals: Progressing toward goals    Frequency    Min 1X/week      PT Plan Current plan remains appropriate       AM-PAC PT "6 Clicks" Mobility   Outcome Measure  Help needed turning from your back to your side while in a flat bed without using bedrails?: A Little Help needed moving from lying on your back to sitting on the side of a flat bed without using bedrails?: A Little Help needed moving to and from a bed to a chair (including a wheelchair)?: A Little Help needed standing up from a chair using your arms (e.g., wheelchair or bedside chair)?: A Little Help needed to walk in hospital room?: A Little Help needed climbing 3-5 steps with a railing? : A Little 6 Click Score: 18    End of Session   Activity Tolerance: Patient tolerated treatment well Patient left: in chair;with call bell/phone within reach;with chair alarm set Nurse Communication: Mobility status  PT Visit Diagnosis: Unsteadiness on feet (R26.81);History of falling (Z91.81);Muscle weakness (generalized) (M62.81)     Time: 0981-1914 PT Time Calculation (min) (ACUTE ONLY): 24 min  Charges:    $Gait Training: 8-22 mins $Therapeutic Activity: 8-22 mins PT General Charges $$ ACUTE PT VISIT: 1 Visit                    Jetta Lout PTA 11/20/22, 10:09 AM

## 2022-11-20 NOTE — TOC Initial Note (Signed)
Transition of Care [facility]) - Initial/Assessment Note    Patient Details  Name: Eric Hicks MRN: [id number] Date of Birth: 11-Nov-1926  Transition of Care [facility]) CM/SW Contact:    Liliana Cline, LCSW Phone Number: 11/20/2022, 1:38 PM  Clinical Narrative:                 Spoke with daughter [name] who is at bedside. Patient is from home alone, his son lives next door. Patient was driving until recently, now his children provide transportation. PCP is Dr. Darrick Huntsman. [facility]. Patient has a rollator and a cane at home. Patient went to [facility] recently. He has also had HH in the past, unsure of agency used.  CSW explained the recommendation for OPPT at DC. [name] states she is not sure if patient wants PT or not. Debra requested list of OPPT Clinics and will review, she agreed to call CSW if they decide they do want OPPT arranged prior to DC. CSW call back number provided.   Expected Discharge Plan: Home/Self Care Barriers to Discharge: Continued Medical Work up   Patient Goals and CMS Choice   CMS Medicare.gov Compare Post Acute Care list provided to:: Patient Represenative (must comment) Choice offered to / list presented to : Adult Children      Expected Discharge Plan and Services       Living arrangements for the past 2 months: Single Family Home                                      Prior Living Arrangements/Services Living arrangements for the past 2 months: Single Family Home Lives with:: Self Patient language and need for interpreter reviewed:: Yes Do you feel safe going back to the place where you live?: Yes      Need for Family Participation in Patient Care: Yes (Comment) Care giver support system in place?: Yes (comment) Current home services: DME Criminal Activity/Legal Involvement Pertinent to Current Situation/Hospitalization: No - Comment as needed  Activities of Daily Living      Permission Sought/Granted Permission sought to share  information with : Oceanographer granted to share information with : Yes, Verbal Permission Granted (by daughter [name])     Permission granted to share info w AGENCY: if needed        Emotional Assessment         Alcohol / Substance Use: Not Applicable Psych Involvement: No (comment)  Admission diagnosis:  Hypokalemia [E87.6] Acute kidney injury (HCC) [N17.9] Acute renal failure superimposed on stage 3a chronic kidney disease (HCC) [N17.9, N18.31] Patient Active Problem List   Diagnosis Date Noted   Recurrent falls 11/19/2022   Skin tear of forearm without complication, left, initial encounter 11/19/2022   Acute renal failure superimposed on stage 3a chronic kidney disease (HCC) 11/18/2022   Chronic diastolic CHF (congestive heart failure) (HCC) 11/18/2022   HTN (hypertension) 11/18/2022   Hypokalemia 11/18/2022   Controlled type 2 diabetes mellitus with stage 3 chronic kidney disease, without long-term current use of insulin (HCC) 09/24/2022   History of GI bleed 08/18/2022   Acute blood loss as cause of postoperative anemia 08/18/2022   History of pulmonary embolism 03/26/2022   Closed right hip fracture (HCC) 03/23/2022   CKD (chronic kidney disease) stage 4, GFR 15-29 ml/min (HCC) 03/23/2022   Hyperlipidemia 03/23/2022   B12 deficiency 09/06/2021   Counseling regarding end  of life decision making 03/04/2021   Lung cancer, upper lobe (HCC) 09/01/2019   Thoracic ascending aortic aneurysm (HCC) 09/01/2019   Nonischemic dilated cardiomyopathy (HCC) 07/08/2018   Paroxysmal atrial fibrillation (HCC) 07/08/2018   Aortic arch atherosclerosis (HCC) 07/08/2018   Low back pain 12/03/2017   OA (osteoarthritis) of neck 09/29/2017   Prostate cancer (HCC) 03/30/2016   S/p bilateral myringotomy with tube placement 03/30/2016   Hearing aid worn 03/30/2016   Chronic hip pain 04/13/2014   White coat syndrome with diagnosis of hypertension 11/12/2012    Medicare annual wellness visit, subsequent 11/10/2012   Osteoporosis 01/03/2012   History of adenomatous polyp of colon 11/14/2011   COPD (chronic obstructive pulmonary disease) with emphysema (HCC) 11/14/2011   BPH (benign prostatic hyperplasia)    PCP:  Sherlene Shams, MD [phone] [place] [place] [facility] [address] [place] [address] Phone: [phone] Fax: [phone]     Social Determinants of Health (SDOH) Social History: SDOH Screenings   Food Insecurity: No Food Insecurity (10/11/2022)  Housing: Low Risk  (10/11/2022)  Transportation Needs: No Transportation Needs (10/11/2022)  Utilities: Not At Risk (10/11/2022)  Alcohol Screen: Low Risk  (10/11/2022)  Depression (PHQ2-9): Medium Risk (11/17/2022)  Financial Resource Strain: Low Risk  (10/11/2022)  Physical Activity: Insufficiently Active (10/11/2022)  Social Connections: Socially Integrated (10/11/2022)  Recent Concern: Social Connections - Moderately Isolated (08/15/2022)  Stress: No Stress Concern Present (10/11/2022)  Tobacco Use: Medium Risk (11/17/2022)   SDOH Interventions:     Readmission Risk Interventions    11/20/2022    1:37 PM  Readmission Risk Prevention Plan  Transportation Screening Complete  PCP or Specialist Appt within 3-5 Days Complete  HRI or Home Care Consult Complete  Social Work Consult for Recovery Care Planning/Counseling Complete  Palliative Care Screening Not Applicable  Medication Review Oceanographer) Complete

## 2022-11-20 NOTE — Progress Notes (Signed)
Lost IV access. Patient refused to allow nursing staff to stick him again thus far. MD aware.  Cornell Barman Aviana Shevlin

## 2022-11-20 NOTE — Discharge Summary (Addendum)
Physician Discharge Summary   Patient: Eric Hicks MRN: [id number]  DOB: 1926-09-16   Admit:     Date of Admission: 11/18/2022 Admitted from: home   Discharge: Date of discharge: 11/20/22 Disposition: Home Condition at discharge: good  CODE STATUS: DNR     Discharge Physician: Sunnie Nielsen, DO [facility]     PCP: Sherlene Shams, MD  Recommendations for Outpatient Follow-up:  Follow up with PCP Sherlene Shams, MD in 1-2 weeks Please obtain labs/tests: BMP in 1 week or sooner  Please follow up on the following pending results: none  Discharge Instructions     Diet general   Complete by: As directed    Increase activity slowly   Complete by: As directed          Discharge Diagnoses: Principal Problem:   Acute renal failure superimposed on stage 3a chronic kidney disease (HCC) Active Problems:   COPD (chronic obstructive pulmonary disease) with emphysema (HCC)   Chronic diastolic CHF (congestive heart failure) (HCC)   HTN (hypertension)   Paroxysmal atrial fibrillation (HCC)   Hypokalemia   Hyperlipidemia       Hospital Course: Eric Hicks is a 89 y.o. male with medical history significant of CKD-3a, HTN, HLD, COPD, dCHF, A fib not on AC, GIB, prostate cancer, BPH, anemia, who presents to ED 11/18/22 at the instruction of PCP after labs (routine done at 3-mo f/u visit) revealed worsening renal function. PCP notes reviewed - also concern for weight loss 25 lbs x6 mos, frequent falls, weakness, grief and wt loss attributed somewhat to recent death of his dog few days ago.  07/25: admitted for AKI and hypokalemia. Cr 3.49, renal US obtained, medical renal disease.  07/26: Cr 2.71 and K 2.5 this morning --> Cr 2.5 and K 3.0 mid-day. Continue IV fluids  07/27: lost IV but tolerating po hydration, AM and noon labs trending favorably, stable for discharge   Consultants:  none  Procedures: none      ASSESSMENT & PLAN:   Acute renal  failure superimposed on stage 3a chronic kidney disease (HCC):  recent baseline creatinine 1.50 on 05/05/2022 and 2.03 on 08/18/2022.  His creatinine was 3.49 in clinic, and 3.11 in ED, BUN 86.  Possibly due to dehydration.  Renal ultrasound negative for hydronephrosis. Hold Lasix and metolazone  Encourage po hydration  Close follow up w/ PCP    COPD (chronic obstructive pulmonary disease) with emphysema (HCC) As needed bronchodilators   Chronic diastolic CHF (congestive heart failure) (HCC): 2D echo on 03/26/2022 showed EF of 55-60% with grade 1 diastolic dysfunction.  Patient is clinically dry. Watch volume status closely on fluids Hold diuretics   Essential HTN (hypertension):  Blood pressure 111/79 IV hydralazine as needed   Paroxysmal atrial fibrillation (HCC):  Heart rate 89.  Patient is not taking anticoagulants. Telemetry monitoring   Hypokalemia:  Replace as needed Monitor BMP   Hyperlipidemia Lipitor              Discharge Instructions  Allergies as of 11/20/2022       Reactions   Tylenol [acetaminophen] Rash        Medication List     STOP taking these medications    EYLEA IO   furosemide 20 MG tablet Commonly known as: LASIX   metolazone 2.5 MG tablet Commonly known as: ZAROXOLYN   mirtazapine 30 MG tablet Commonly known as: REMERON       TAKE these medications  AMBULATORY NON FORMULARY MEDICATION Joint Advantage Gold 2 tablets daily   atorvastatin 20 MG tablet Commonly known as: LIPITOR TAKE 1 TABLET BY MOUTH EVERY OTHER DAY What changed: additional instructions   brimonidine 0.2 % ophthalmic solution Commonly known as: ALPHAGAN Place 1 drop into the left eye 2 (two) times daily.   calcium carbonate 600 MG Tabs tablet Commonly known as: OS-CAL Take 600 mg by mouth daily with breakfast.   dorzolamide-timolol 2-0.5 % ophthalmic solution Commonly known as: COSOPT TAKE 1 DROP(S) IN RIGHT EYE 2 TIMES A DAY   ferrous  sulfate 325 (65 FE) MG tablet Take 1 tablet by mouth daily.   fluticasone 50 MCG/ACT nasal spray Commonly known as: FLONASE Place 1 spray into both nostrils daily.   fluticasone-salmeterol 100-50 MCG/ACT Aepb Commonly known as: Wixela Inhub Inhale 1 puff into the lungs 2 (two) times daily.   latanoprost 0.005 % ophthalmic solution Commonly known as: XALATAN Place 1 drop into both eyes at bedtime.   leuprolide (6 Month) 45 MG injection Commonly known as: ELIGARD Inject 45 mg into the skin every 6 (six) months.   omeprazole 40 MG capsule Commonly known as: PRILOSEC Take 40 mg by mouth daily.   potassium chloride 10 MEQ tablet Commonly known as: KLOR-CON M Take 2 tablets (20 mEq total) by mouth daily for 5 days.   Vitamin D (Cholecalciferol) 25 MCG (1000 UT) Caps Take 1 capsule by mouth daily.         Follow-up Information     Sherlene Shams, MD. Schedule an appointment as soon as possible for a visit.   Specialty: Internal Medicine Why: hospital follow up ASAP within 1 week, ideally would at least like to get blood work (BMP) in the enxt week Contact information: [address] [place] [address] [phone]                 Allergies  Allergen Reactions   Tylenol [Acetaminophen] Rash     Subjective: pt feeling well this morning, eager for discharge home. No concerns, feels more energetic today, eating and drinking well    Discharge Exam: BP 125/78 (BP Location: Left Arm)   Pulse (!) 47   Temp 97.8 F (36.6 C)   Resp 16   Ht 5\' 10"  (1.778 m)   Wt 73 kg   SpO2 96%   BMI 23.09 kg/m  General: Pt is alert, awake, not in acute distress Cardiovascular: RRR, S1/S2 +, no rubs, no gallops Respiratory: CTA bilaterally, no wheezing, no rhonchi Abdominal: Soft, NT, ND, bowel sounds + Extremities: no edema, no cyanosis     The results of significant diagnostics from this hospitalization (including imaging, microbiology, ancillary  and laboratory) are listed below for reference.     Microbiology: No results found for this or any previous visit (from the past 240 hour(s)).   Labs: BNP (last 3 results) Recent Labs    03/25/22 1526 11/18/22 1332  BNP 1,960.8* 953.6*   Basic Metabolic Panel: Recent Labs  Lab 11/18/22 1332 11/19/22 0456 11/19/22 1211 11/20/22 0353 11/20/22 1143  NA 139 140 140 140 140  K 2.4* 2.5* 3.0* 3.4* 3.8  CL 103 105 105 107 106  CO2 25 26 25 25 25   GLUCOSE 130* 110* 115* 112* 110*  BUN 86* 89* 83* 72* 66*  CREATININE 3.11* 2.71* 2.51* 2.28* 2.13*  CALCIUM 8.4* 8.1* 8.1* 7.9* 8.3*  MG 2.3  --   --   --   --  Liver Function Tests: Recent Labs  Lab 11/17/22 1557  AST 20  ALT 15  ALKPHOS 51  BILITOT 0.7  PROT 5.9*  ALBUMIN 3.2*   No results for input(s): "LIPASE", "AMYLASE" in the last 168 hours. No results for input(s): "AMMONIA" in the last 168 hours. CBC: Recent Labs  Lab 11/18/22 1332 11/19/22 0456  WBC 8.3 8.8  HGB 11.5* 10.5*  HCT 34.3* 30.2*  MCV 96.3 95.9  PLT 224 216   Cardiac Enzymes: No results for input(s): "CKTOTAL", "CKMB", "CKMBINDEX", "TROPONINI" in the last 168 hours. BNP: Invalid input(s): "POCBNP" CBG: Recent Labs  Lab 11/20/22 0748  GLUCAP 107*   D-Dimer No results for input(s): "DDIMER" in the last 72 hours. Hgb A1c Recent Labs    11/17/22 1557  HGBA1C 6.3   Lipid Profile Recent Labs    11/17/22 1557  CHOL 76  HDL 26.40*  LDLCALC 30  TRIG 100.0  CHOLHDL 3  LDLDIRECT 28.0   Thyroid function studies No results for input(s): "TSH", "T4TOTAL", "T3FREE", "THYROIDAB" in the last 72 hours.  Invalid input(s): "FREET3" Anemia work up No results for input(s): "VITAMINB12", "FOLATE", "FERRITIN", "TIBC", "IRON", "RETICCTPCT" in the last 72 hours. Urinalysis No results found for: "COLORURINE", "APPEARANCEUR", "LABSPEC", "PHURINE", "GLUCOSEU", "HGBUR", "BILIRUBINUR", "KETONESUR", "PROTEINUR", "UROBILINOGEN", "NITRITE",  "LEUKOCYTESUR" Sepsis Labs Recent Labs  Lab 11/18/22 1332 11/19/22 0456  WBC 8.3 8.8   Microbiology No results found for this or any previous visit (from the past 240 hour(s)). Imaging US RENAL  Result Date: 11/18/2022 CLINICAL DATA:  Renal dysfunction EXAM: RENAL / URINARY TRACT ULTRASOUND COMPLETE COMPARISON:  03/26/2022 FINDINGS: Right Kidney: Renal measurements: 10.2 x 4.9 x 4.5 cm = volume: 116.8 mL. There is no hydronephrosis. Right renal pelvis is slightly prominent without dilation of minor calices. There is 8 mm calculus in the upper pole. There is possible 7 mm cyst in the midportion. There is thinning of renal cortex. Left Kidney: Renal measurements: 10.4 x 5.2 x 3.9 cm = volume: 111.7 mL. There is no hydronephrosis. There is a 7 mm calculus in the midportion. There is a 1 cm left renal cyst. Bladder: Appears normal for degree of bladder distention. Other: Prostate is enlarged projecting into the base of the bladder. IMPRESSION: There is no hydronephrosis. There is cortical thinning in the kidneys suggesting medical renal disease. Small bilateral renal stones. Small bilateral renal cysts. Electronically Signed   By: Ernie Avena M.D.   On: 11/18/2022 18:07      Time coordinating discharge: over 30 minutes  SIGNED:  Sunnie Nielsen DO [facility]

## 2022-11-20 NOTE — Progress Notes (Signed)
Patient wheeled to the medical mall exit by staff to be discharged to the care of son, in stable condition.  Eric Hicks

## 2022-11-20 NOTE — Progress Notes (Signed)
Discharge education completed. Labs reviewed. Patient is dressed. Belongings are gathered including his bilat hearing aids, dentures and clothing. Daughter at the bedside. Waiting for son to arrive for the ride home.  Cornell Barman Deane Wattenbarger

## 2022-11-20 NOTE — Plan of Care (Signed)
Patient is adequate for discharge, resolving plan of care.  Eric Hicks

## 2022-11-22 ENCOUNTER — Telehealth: Payer: Self-pay | Admitting: *Deleted

## 2022-11-22 NOTE — Telephone Encounter (Signed)
LMTCB

## 2022-11-22 NOTE — Telephone Encounter (Signed)
Lab appt scheduled and lab order placed

## 2022-11-22 NOTE — Telephone Encounter (Signed)
Pt son would like to be called concerning the pt

## 2022-11-22 NOTE — Progress Notes (Unsigned)
  Care Coordination  Outreach Note  11/22/2022 Name: Eric Hicks MRN: [id number] DOB: 01/18/1927   Care Coordination Outreach Attempts: An unsuccessful telephone outreach was attempted today to offer the patient information about available care coordination services.  Follow Up Plan:  Additional outreach attempts will be made to offer the patient care coordination information and services.   Encounter Outcome:  No Answer  Burman Nieves, CCMA Care Coordination Care Guide Direct Dial: [phone]

## 2022-11-22 NOTE — Transitions of Care (Post Inpatient/ED Visit) (Signed)
   11/22/2022  Name: Eric Hicks MRN: [id number] DOB: 1926-08-31  Today's TOC FU Call Status: Today's TOC FU Call Status:: Unsuccessul Call (1st Attempt) Unsuccessful Call (1st Attempt) Date: 11/22/22  Attempted to reach the patient regarding the most recent Inpatient/ED visit.  Follow Up Plan: Additional outreach attempts will be made to reach the patient to complete the Transitions of Care (Post Inpatient/ED visit) call.   Gean Maidens BSN RN [facility] [phone]

## 2022-11-23 ENCOUNTER — Telehealth: Payer: Self-pay | Admitting: *Deleted

## 2022-11-23 ENCOUNTER — Other Ambulatory Visit (INDEPENDENT_AMBULATORY_CARE_PROVIDER_SITE_OTHER): Payer: Medicare Other

## 2022-11-23 DIAGNOSIS — N179 Acute kidney failure, unspecified: Secondary | ICD-10-CM

## 2022-11-23 DIAGNOSIS — N1831 Chronic kidney disease, stage 3a: Secondary | ICD-10-CM | POA: Diagnosis not present

## 2022-11-23 NOTE — Transitions of Care (Post Inpatient/ED Visit) (Signed)
   11/23/2022  Name: Eric Hicks MRN: [id number] DOB: [birth year]  Today's TOC FU Call Status: Today's TOC FU Call Status:: Unsuccessful Call (2nd Attempt) Unsuccessful Call (2nd Attempt) Date: 11/23/22  Attempted to reach the patient regarding the most recent Inpatient/ED visit.  Follow Up Plan: Additional outreach attempts will be made to reach the patient to complete the Transitions of Care (Post Inpatient/ED visit) call.   Gean Maidens BSN RN [facility] [phone]

## 2022-11-23 NOTE — Progress Notes (Unsigned)
  Care Coordination  Outreach Note  11/23/2022 Name: [name] MRN: [id number] DOB: 10-15-1926   Care Coordination Outreach Attempts: A second unsuccessful outreach was attempted today to offer the patient with information about available care coordination services.  Follow Up Plan:  Additional outreach attempts will be made to offer the patient care coordination information and services.   Encounter Outcome:  No Answer  Burman Nieves, CCMA Care Coordination Care Guide Direct Dial: [phone]

## 2022-11-24 ENCOUNTER — Encounter (INDEPENDENT_AMBULATORY_CARE_PROVIDER_SITE_OTHER): Payer: Self-pay

## 2022-11-24 ENCOUNTER — Telehealth: Payer: Self-pay | Admitting: *Deleted

## 2022-11-24 NOTE — Transitions of Care (Post Inpatient/ED Visit) (Signed)
   11/24/2022  Name: Eric Hicks MRN: [id number] DOB: 06/05/1926  Today's TOC FU Call Status: Today's TOC FU Call Status:: Unsuccessful Call (3rd Attempt) Unsuccessful Call (3rd Attempt) Date: 11/24/22  Attempted to reach the patient regarding the most recent Inpatient/ED visit.  Follow Up Plan: No further outreach attempts will be made at this time. We have been unable to contact the patient.  Gean Maidens BSN RN [facility] [phone]

## 2022-11-25 ENCOUNTER — Telehealth: Payer: Self-pay | Admitting: *Deleted

## 2022-11-25 NOTE — Progress Notes (Signed)
  Care Coordination Note  11/25/2022 Name: Eric Hicks MRN: [id number] DOB: 10-24-1926  Eric Hicks is a 89 y.o. year old male who is a primary care patient of Darrick Huntsman, Mar Daring, MD and is actively engaged with the care management team. I reached out to Eric Hicks by phone today to assist with scheduling an initial visit with the BSW  Follow up plan: We have been unable to make contact with the patient for follow up.    Burman Nieves, CCMA Care Coordination Care Guide Direct Dial: [phone]

## 2022-11-25 NOTE — Transitions of Care (Post Inpatient/ED Visit) (Signed)
11/25/2022  Name: Eric Hicks MRN: [id number] DOB: August 01, 1926  Today's TOC FU Call Status: Today's TOC FU Call Status:: Successful TOC FU Call Completed TOC FU Call Complete Date: 11/25/22  Transition Care Management Follow-up Telephone Call Date of Discharge: 11/20/22 Discharge Facility[facility]) Type of Discharge: Inpatient Admission Primary Inpatient Discharge Diagnosis:: acute renal failure superimposed on stage 3 chronic kidney disease How have you been since you were released from the hospital?: Better Any questions or concerns?: No  Items Reviewed: Did you receive and understand the discharge instructions provided?: Yes Medications obtained,verified, and reconciled?: Yes (Medications Reviewed) Any new allergies since your discharge?: No Do you have support at home?: Yes People in Home: alone Name of Support/Comfort Primary Source: Debra  Medications Reviewed Today: Medications Reviewed Today     Reviewed by Luella Cook, RN (Case Manager) on 11/25/22 at 1609  Med List Status: <None>   Medication Order Taking? Sig Documenting Provider Last Dose Status Informant  AMBULATORY NON FORMULARY MEDICATION [id number] Yes Joint Advantage Gold 2 tablets daily [provider] Taking Active Self, Family Member  atorvastatin (LIPITOR) 20 MG tablet [id number] Yes TAKE 1 TABLET BY MOUTH EVERY OTHER DAY  Patient taking differently: Take 20 mg by mouth every other day. qhs   Sherlene Shams, MD Taking Active Self, Family Member  brimonidine [facility]) 0.2 % ophthalmic solution [id number] Yes Place 1 drop into the left eye 2 (two) times daily. [provider] Taking Active Self, Family Member  calcium carbonate (OS-CAL) 600 MG TABS [id number] Yes Take 600 mg by mouth daily with breakfast.  [provider] Taking Active Self, Family Member  dorzolamide-timolol (COSOPT) 22.3-6.8 MG/ML ophthalmic solution [id number] Yes TAKE 1 DROP(S) IN  RIGHT EYE 2 TIMES A DAY [provider] Taking Active Self, Family Member  ferrous sulfate 325 (65 FE) MG tablet [id number] Yes Take 1 tablet by mouth daily. [provider] Taking Active Self, Family Member  fluticasone (FLONASE) 50 MCG/ACT nasal spray [id number] Yes Place 1 spray into both nostrils daily. [provider] Taking Active Self, Family Member  fluticasone-salmeterol [facility]) 100-50 MCG/ACT AEPB [id number] Yes Inhale 1 puff into the lungs 2 (two) times daily. Sherlene Shams, MD Taking Active Self, Family Member  latanoprost (XALATAN) 0.005 % ophthalmic solution [id number] Yes Place 1 drop into both eyes at bedtime. [provider] Taking Active Self, Family Member  leuprolide, 6 Month, (ELIGARD) 45 MG injection [id number] Yes Inject 45 mg into the skin every 6 (six) months. [provider] Taking Active Self, Family Member  omeprazole (PRILOSEC) 40 MG capsule [id number] Yes Take 40 mg by mouth daily. [provider] Taking Active Self, Family Member  potassium chloride (KLOR-CON M) 10 MEQ tablet [id number] Yes Take 2 tablets (20 mEq total) by mouth daily for 5 days. Sunnie Nielsen, DO Taking Active   Vitamin D, Cholecalciferol, 25 MCG (1000 UT) CAPS [id number] Yes Take 1 capsule by mouth daily. [provider] Taking Active Self, Family Member            Home Care and Equipment/Supplies: Were Home Health Services Ordered?: NA Any new equipment or medical supplies ordered?: NA  Functional Questionnaire: Do you need assistance with bathing/showering or dressing?: No Do you need assistance with meal preparation?: No Do you have difficulty maintaining continence: No Do you need assistance with getting out of bed/getting out of a chair/moving?: No Do you have difficulty managing or taking your medications?: No  Follow  up appointments reviewed: PCP Follow-up appointment confirmed?: Yes Date of PCP follow-up  appointment?: 12/22/22 Follow-up Provider: [facility] Follow-up appointment confirmed?: NA Do you need transportation to your follow-up appointment?: No Do you understand care options if your condition(s) worsen?: Yes-patient verbalized understanding  SDOH Interventions Today    Flowsheet Row Most Recent Value  SDOH Interventions   Food Insecurity Interventions Intervention Not Indicated  Housing Interventions Intervention Not Indicated  Transportation Interventions Intervention Not Indicated, Patient Resources (Friends/Family)      Interventions Today    Flowsheet Row Most Recent Value  General Interventions   General Interventions Discussed/Reviewed General Interventions Discussed, General Interventions Reviewed, Doctor Visits  Doctor Visits Discussed/Reviewed Doctor Visits Reviewed, Doctor Visits Discussed  Pharmacy Interventions   Pharmacy Dicussed/Reviewed Pharmacy Topics Discussed      TOC Interventions Today    Flowsheet Row Most Recent Value  TOC Interventions   TOC Interventions Discussed/Reviewed TOC Interventions Discussed, TOC Interventions Reviewed        Gean Maidens BSN RN [facility] [phone]

## 2022-11-30 DIAGNOSIS — D649 Anemia, unspecified: Secondary | ICD-10-CM | POA: Diagnosis not present

## 2022-11-30 DIAGNOSIS — N184 Chronic kidney disease, stage 4 (severe): Secondary | ICD-10-CM | POA: Diagnosis not present

## 2022-11-30 DIAGNOSIS — R109 Unspecified abdominal pain: Secondary | ICD-10-CM | POA: Diagnosis not present

## 2022-11-30 DIAGNOSIS — I129 Hypertensive chronic kidney disease with stage 1 through stage 4 chronic kidney disease, or unspecified chronic kidney disease: Secondary | ICD-10-CM | POA: Diagnosis not present

## 2022-11-30 DIAGNOSIS — N3000 Acute cystitis without hematuria: Secondary | ICD-10-CM | POA: Diagnosis not present

## 2022-11-30 DIAGNOSIS — Z87891 Personal history of nicotine dependence: Secondary | ICD-10-CM | POA: Diagnosis not present

## 2022-11-30 DIAGNOSIS — Z888 Allergy status to other drugs, medicaments and biological substances status: Secondary | ICD-10-CM | POA: Diagnosis not present

## 2022-12-09 ENCOUNTER — Ambulatory Visit (INDEPENDENT_AMBULATORY_CARE_PROVIDER_SITE_OTHER): Payer: Medicare Other

## 2022-12-09 ENCOUNTER — Encounter (INDEPENDENT_AMBULATORY_CARE_PROVIDER_SITE_OTHER): Payer: Self-pay

## 2022-12-09 DIAGNOSIS — E538 Deficiency of other specified B group vitamins: Secondary | ICD-10-CM | POA: Diagnosis not present

## 2022-12-09 MED ORDER — CYANOCOBALAMIN 1000 MCG/ML IJ SOLN
1000.0000 ug | Freq: Once | INTRAMUSCULAR | Status: AC
Start: 2022-12-09 — End: 2022-12-09
  Administered 2022-12-09: 1000 ug via INTRAMUSCULAR

## 2022-12-09 NOTE — Progress Notes (Signed)
After obtaining consent, and per orders of Dr. Darrick Huntsman, injection of B12 given by Kristie Cowman in the right deltoid. Patient tolerated injection well.

## 2022-12-15 ENCOUNTER — Ambulatory Visit: Payer: Self-pay

## 2022-12-15 NOTE — Patient Outreach (Signed)
  Care Coordination   Follow Up Visit Note   12/15/2022 Name: TAHJAI AMARI MRN: [id number] DOB: [birth year]  Corliss Blacker Boudoin is a 89 y.o. year old male who sees Darrick Huntsman, Mar Daring, MD for primary care. I spoke with  Corliss Blacker Tsosie by phone today.  What matters to the patients health and wellness today?  Per chart review patient hospitalized from 11/18/22 to 11/20/22 for acute renal injury.  Patient reports he is doing better since discharge.  He reports having follow up visit with nephrologist on 11/30/22 and having repeat lab work which showed GFR increased to 30.  Patient states he is on his 2nd antibiotic Ciprofloxacin.  Patient states he feels his appetite is much better.  He reports eating 3 meals per day.  Patient states his most recent weight is 162.2 lbs.  Patient states provider discontinued his 2 blood pressure pills due to lower blood pressure readings. He reports his blood pressures are ranging from 100-110/60's.  Patient states he experiences occasional dizziness.  Per chart review patient has follow up visit with primary provider on 12/22/22   Goals Addressed             This Visit's Progress    Health management/ education related to fall prevention/ macular degeneration       Interventions Today    Flowsheet Row Most Recent Value  Chronic Disease   Chronic disease during today's visit Other  [status post acute kidney injury, hypotension]  General Interventions   General Interventions Discussed/Reviewed General Interventions Reviewed, Doctor Visits, Labs  [place] of current treatment plan status post acute kidney injury / hypotension and patients adherence to plan as established by provider.  Assessed for BP, weight, new or ongoing symptoms.]  Labs Kidney Function  [Discussed/ reviewed most recent GFR results from 8/6]  Doctor Visits Discussed/Reviewed Doctor Visits Reviewed  [facility] nephrology visit from 11/30/22.  Reviewed upcoming provider visits.  Advised to keep follow up visits  with providers as recommended.]  Mental Health Interventions   Mental Health Discussed/Reviewed Mental Health Reviewed, Coping Strategies, Grief and Loss  [Discussed grief and strategy for coping with loss of dog. Advised to consider getting another dog not necessarily a new puppy but consider rescuing a dog from shelter.]  Nutrition Interventions   Nutrition Discussed/Reviewed Nutrition Reviewed, Increasing proteins  [Discussed current appetite level.  Reviewed what patient eats in a day.  Advised to eat items higher in calorie and increase proteins.  Advised to stay hydrated.]  Pharmacy Interventions   Pharmacy Dicussed/Reviewed Pharmacy Topics Reviewed  [medications reviewed.  Discussed addition of antibiotic to medication regimen. Advised to take antibiotic to completion. Advised to contact provider for concerns related to medication.]              SDOH assessments and interventions completed:  No     Care Coordination Interventions:  Yes, provided   Follow up plan: Follow up call scheduled for 12/22/08    Encounter Outcome:  Pt. Visit Completed   George Ina RN,BSN,CCM [facility] Care Coordination [phone] direct line

## 2022-12-15 NOTE — Patient Instructions (Signed)
Visit Information  Thank you for taking time to visit with me today. Please don't hesitate to contact me if I can be of assistance to you.   Following are the goals we discussed today:   Goals Addressed             This Visit's Progress    Health management/ education related to fall prevention/ macular degeneration       Interventions Today    Flowsheet Row Most Recent Value  Chronic Disease   Chronic disease during today's visit Other  [status post acute kidney injury, hypotension]  General Interventions   General Interventions Discussed/Reviewed General Interventions Reviewed, Doctor Visits, Labs  [place] of current treatment plan status post acute kidney injury / hypotension and patients adherence to plan as established by provider.  Assessed for BP, weight, new or ongoing symptoms.]  Labs Kidney Function  [Discussed/ reviewed most recent GFR results from 8/6]  Doctor Visits Discussed/Reviewed Doctor Visits Reviewed  [facility] nephrology visit from 11/30/22.  Reviewed upcoming provider visits.  Advised to keep follow up visits with providers as recommended.]  Mental Health Interventions   Mental Health Discussed/Reviewed Mental Health Reviewed, Coping Strategies, Grief and Loss  [Discussed grief and strategy for coping with loss of dog. Advised to consider getting another dog not necessarily a new puppy but consider rescuing a dog from shelter.]  Nutrition Interventions   Nutrition Discussed/Reviewed Nutrition Reviewed, Increasing proteins  [Discussed current appetite level.  Reviewed what patient eats in a day.  Advised to eat items higher in calorie and increase proteins.  Advised to stay hydrated.]  Pharmacy Interventions   Pharmacy Dicussed/Reviewed Pharmacy Topics Reviewed  [medications reviewed.  Discussed addition of antibiotic to medication regimen. Advised to take antibiotic to completion. Advised to contact provider for concerns related to medication.]               Our next appointment is by telephone on 12/23/22 at 10 am  Please call the care guide team at [phone] if you need to cancel or reschedule your appointment.   If you are experiencing a Mental Health or Behavioral Health Crisis or need someone to talk to, please call the Suicide and Crisis Lifeline: 988 call 1-800-273-TALK (toll free, 24 hour hotline)  Patient verbalizes understanding of instructions and care plan provided today and agrees to view in MyChart. Active MyChart status and patient understanding of how to access instructions and care plan via MyChart confirmed with patient.     George Ina RN,BSN,CCM [facility] Care Coordination [phone] direct line

## 2022-12-22 ENCOUNTER — Ambulatory Visit (INDEPENDENT_AMBULATORY_CARE_PROVIDER_SITE_OTHER): Payer: Medicare Other | Admitting: Internal Medicine

## 2022-12-22 ENCOUNTER — Encounter: Payer: Self-pay | Admitting: Internal Medicine

## 2022-12-22 VITALS — BP 142/72 | HR 79 | Temp 97.8°F | Ht 70.0 in | Wt 166.4 lb

## 2022-12-22 DIAGNOSIS — D62 Acute posthemorrhagic anemia: Secondary | ICD-10-CM

## 2022-12-22 DIAGNOSIS — E876 Hypokalemia: Secondary | ICD-10-CM

## 2022-12-22 DIAGNOSIS — N3000 Acute cystitis without hematuria: Secondary | ICD-10-CM | POA: Diagnosis not present

## 2022-12-22 DIAGNOSIS — D509 Iron deficiency anemia, unspecified: Secondary | ICD-10-CM

## 2022-12-22 DIAGNOSIS — I420 Dilated cardiomyopathy: Secondary | ICD-10-CM | POA: Diagnosis not present

## 2022-12-22 DIAGNOSIS — N3090 Cystitis, unspecified without hematuria: Secondary | ICD-10-CM | POA: Diagnosis not present

## 2022-12-22 DIAGNOSIS — C61 Malignant neoplasm of prostate: Secondary | ICD-10-CM

## 2022-12-22 DIAGNOSIS — I5032 Chronic diastolic (congestive) heart failure: Secondary | ICD-10-CM

## 2022-12-22 MED ORDER — TRAMADOL HCL 50 MG PO TABS: 50.0000 mg | ORAL_TABLET | Freq: Four times a day (QID) | ORAL | 0 refills | Status: AC | PRN

## 2022-12-22 NOTE — Patient Instructions (Addendum)
I am prescribing tramadol for your pain. DO NOT TAKE IT MORE THAN EVERY 12 HOURS (IGNORE THE  INSTRUCTIONS)  TAKE  YOUR FIRST DOSE WHEN YOU ARE WITH FAMILY IN CASE IT MAKES YOU LOOPY    THE ALEVE AND THE FUROSEMIDE CAN CAUSE KIDNEY FAILURE SO AVOID THEM

## 2022-12-22 NOTE — Progress Notes (Addendum)
Subjective:  Patient ID: Eric Hicks, male    DOB: [birth year]  Age: 89 y.o. MRN: [id number]  CC: The primary encounter diagnosis was Iron deficiency anemia, unspecified iron deficiency anemia type. Diagnoses of Hypokalemia, Acute cystitis without hematuria, Nonischemic dilated cardiomyopathy (HCC), Prostate cancer (HCC), Acute blood loss as cause of postoperative anemia, Cystitis, and Chronic diastolic CHF (congestive heart failure) (HCC) were also pertinent to this visit.   HPI Eric Hicks presents for  Chief Complaint  Patient presents with   Medical Management of Chronic Issues    1 month follow up    Eric Hicks was recently briefly admitted to [facility] with acute on chronic renal failure secondary to dehydration.  He followed up with his nephrologist at [facility] on August 8 who reviewed hospitalization for AKI and hypokalemia. Furosemide,  metolazone and carvedilol  were suspended during hospitalization and remain  suspended . The office note from [facility]phrology discussed suspending his iron due to his persistently poor appetite, but this was not conveyed to patient and he has been taking it daily.  His nephrologist was also concerned about depression and recommended starting an antidepressnt; however,  patient was grieving the loss of his dog,  is taking remeron,  reports improved appetite and is quite interactive and upbeat today.  His son concurs that there are no signs of depression. .   He has gained several lbs,  4 of which he feels is due to fluid retention in his lower extremities since his diuretics were suspended.  He denies orthopnea and dyspnea with ADL's.  His mood has improved as well.    He is Averaging  30 to 40 ounces of water daily . Getting 60 to 80 ounces daily of protein   Cc: his right  hip pain  has returned , several months after he was released by orthopedics following surgical correction of his hip fracture inn  NOVEMBER 2023 .  The pain is aggravated by weight bearing,  He  has been cautious and has started using  walker   24/7  . Has not seen Eric Hicks since January Eric Hicks)     Outpatient Medications Prior to Visit  Medication Sig Dispense Refill   AMBULATORY NON FORMULARY MEDICATION Joint Advantage Gold 2 tablets daily     atorvastatin (LIPITOR) 20 MG tablet TAKE 1 TABLET BY MOUTH EVERY OTHER DAY (Patient taking differently: Take 20 mg by mouth every other day. qhs) 45 tablet 3   brimonidine (ALPHAGAN) 0.2 % ophthalmic solution 1 drop 3 (three) times daily.     calcium carbonate (OS-CAL) 600 MG TABS Take 600 mg by mouth daily with breakfast.      dorzolamide-timolol (COSOPT) 22.3-6.8 MG/ML ophthalmic solution TAKE 1 DROP(S) IN RIGHT EYE 2 TIMES A DAY  5   ferrous sulfate 325 (65 FE) MG tablet Take 1 tablet by mouth daily.     fluticasone (FLONASE) 50 MCG/ACT nasal spray Place 1 spray into both nostrils daily.     fluticasone-salmeterol (WIXELA INHUB) 100-50 MCG/ACT AEPB Inhale 1 puff into the lungs 2 (two) times daily. 180 each 1   latanoprost (XALATAN) 0.005 % ophthalmic solution Place 1 drop into both eyes at bedtime.  5   leuprolide, 6 Month, (ELIGARD) 45 MG injection Inject 45 mg into the skin every 6 (six) months.     mirtazapine (REMERON) 30 MG tablet Take 30 mg by mouth at bedtime.     Vitamin D, Cholecalciferol, 25 MCG (1000 UT) CAPS Take 1 capsule  by mouth daily.     brimonidine (ALPHAGAN) 0.2 % ophthalmic solution Place 1 drop into the left eye 2 (two) times daily.     omeprazole (PRILOSEC) 40 MG capsule Take 40 mg by mouth daily.     potassium chloride (KLOR-CON M) 10 MEQ tablet Take 2 tablets (20 mEq total) by mouth daily for 5 days. 10 tablet 0   No facility-administered medications prior to visit.    Review of Systems;  Patient denies headache, fevers, malaise, unintentional weight loss, skin rash, eye pain, sinus congestion and sinus pain, sore throat, dysphagia,  hemoptysis , cough, dyspnea, wheezing, chest pain, palpitations, orthopnea,  edema, abdominal pain, nausea, melena, diarrhea, constipation, flank pain, dysuria, hematuria, urinary  Frequency, nocturia, numbness, tingling, seizures,  Focal weakness, Loss of consciousness,  Tremor, insomnia, depression, anxiety, and suicidal ideation.      Objective:  BP (!) 142/72   Pulse 79   Temp 97.8 F (36.6 C) (Oral)   Ht 5\' 10"  (1.778 m)   Wt 166 lb 6.4 oz (75.5 kg)   SpO2 94%   BMI 23.88 kg/m   BP Readings from Last 3 Encounters:  12/22/22 (!) 142/72  11/20/22 125/78  11/17/22 96/60    Wt Readings from Last 3 Encounters:  12/22/22 166 lb 6.4 oz (75.5 kg)  11/20/22 160 lb 15 oz (73 kg)  11/17/22 158 lb 12.8 oz (72 kg)    Physical Exam Vitals reviewed.  Constitutional:      General: He is not in acute distress.    Appearance: Normal appearance. He is normal weight. He is not ill-appearing, toxic-appearing or diaphoretic.  HENT:     Head: Normocephalic.  Eyes:     General: No scleral icterus.       Right eye: No discharge.        Left eye: No discharge.     Conjunctiva/sclera: Conjunctivae normal.  Cardiovascular:     Rate and Rhythm: Normal rate and regular rhythm.     Heart sounds: Normal heart sounds.  Pulmonary:     Effort: Pulmonary effort is normal. No respiratory distress.     Breath sounds: Normal breath sounds.  Musculoskeletal:        General: Normal range of motion.     Cervical back: Normal range of motion.     Right lower leg: Edema present.     Left lower leg: Edema present.  Skin:    General: Skin is warm and dry.  Neurological:     General: No focal deficit present.     Mental Status: He is alert and oriented to person, place, and time. Mental status is at baseline.  Psychiatric:        Mood and Affect: Mood normal.        Behavior: Behavior normal.        Thought Content: Thought content normal.        Judgment: Judgment normal.     Lab Results  Component Value Date   HGBA1C 6.3 11/17/2022   HGBA1C 6.7 (H) 08/18/2022    HGBA1C 6.4 03/08/2022    Lab Results  Component Value Date   CREATININE 1.87 (H) 12/22/2022   CREATININE 2.06 (H) 11/23/2022   CREATININE 2.13 (H) 11/20/2022    Lab Results  Component Value Date   WBC 7.0 12/22/2022   HGB 10.8 (L) 12/22/2022   HCT 33.0 (L) 12/22/2022   PLT 214.0 12/22/2022   GLUCOSE 99 12/22/2022   CHOL 76 11/17/2022   TRIG  100.0 11/17/2022   HDL 26.40 (L) 11/17/2022   LDLDIRECT 28.0 11/17/2022   LDLCALC 30 11/17/2022   ALT 15 11/17/2022   AST 20 11/17/2022   NA 143 12/22/2022   K 3.8 12/22/2022   CL 108 12/22/2022   CREATININE 1.87 (H) 12/22/2022   BUN 46 (H) 12/22/2022   CO2 25 12/22/2022   TSH 3.68 05/05/2022   PSA 13.67 (H) 05/15/2014   INR 1.3 (H) 03/25/2022   HGBA1C 6.3 11/17/2022   MICROALBUR 11.2 (H) 09/22/2022    US RENAL  Result Date: 11/18/2022 CLINICAL DATA:  Renal dysfunction EXAM: RENAL / URINARY TRACT ULTRASOUND COMPLETE COMPARISON:  03/26/2022 FINDINGS: Right Kidney: Renal measurements: 10.2 x 4.9 x 4.5 cm = volume: 116.8 mL. There is no hydronephrosis. Right renal pelvis is slightly prominent without dilation of minor calices. There is 8 mm calculus in the upper pole. There is possible 7 mm cyst in the midportion. There is thinning of renal cortex. Left Kidney: Renal measurements: 10.4 x 5.2 x 3.9 cm = volume: 111.7 mL. There is no hydronephrosis. There is a 7 mm calculus in the midportion. There is a 1 cm left renal cyst. Bladder: Appears normal for degree of bladder distention. Other: Prostate is enlarged projecting into the base of the bladder. IMPRESSION: There is no hydronephrosis. There is cortical thinning in the kidneys suggesting medical renal disease. Small bilateral renal stones. Small bilateral renal cysts. Electronically Signed   By: Ernie Avena M.D.   On: 11/18/2022 18:07    Assessment & Plan:  .Iron deficiency anemia, unspecified iron deficiency anemia type -     CBC with Differential/Platelet -     Iron, TIBC and  Ferritin Panel  Hypokalemia -     Basic metabolic panel  Acute cystitis without hematuria -     Urinalysis, Routine w reflex microscopic -     Urine Culture  Nonischemic dilated cardiomyopathy (HCC) Assessment & Plan: Last known EF 30 to 35%.  Despite suspension of diuretics due to acute renal failure in late July, he has clinical signs of failure limited to lower extremity 1+ pitting edema .   Cr had not returned to baseline as of August 9  repeat analysis of GFR is pending   Lab Results  Component Value Date   CREATININE 2.06 (H) 11/23/2022   .   Orders: -     Brain natriuretic peptide  Prostate cancer [facility]) Assessment & Plan: Managed with hormone suppression.  Receiving Lupron.  He is followed by Francia Greaves w   Acute blood loss as cause of postoperative anemia Assessment & Plan: Complicated by CKD.  He has been taking iron supplements.  Repeat levels are pending  Lab Results  Component Value Date   WBC 8.8 11/19/2022   HGB 10.5 (L) 11/19/2022   HCT 30.2 (L) 11/19/2022   MCV 95.9 11/19/2022   PLT 216 11/19/2022   Lab Results  Component Value Date   IRON 34 (L) 12/22/2022   TIBC 232 (L) 12/22/2022   FERRITIN 105 12/22/2022      Cystitis Assessment & Plan: RX CIPRO 500 MG DAILY X 7 DAYS  FOR pyuria on UA.  Culture pending    Chronic diastolic CHF (congestive heart failure) (HCC) Assessment & Plan: GFR improving but not at prior baseline.  GFR 30 .  Resume furosemide at reduced dose of 20 mg every other day   Other orders -     traMADol HCl; Take 1 tablet (50 mg total)  by mouth every 6 (six) hours as needed for up to 7 days.  Dispense: 28 tablet; Refill: 0 -     Furosemide; Take 1 tablet (20 mg total) by mouth every other day.  Dispense: 45 tablet; Refill: 0 -     Ciprofloxacin HCl; Take 1 tablet (500 mg total) by mouth daily for 7 days.  Dispense: 7 tablet; Refill: 0     I provided 30 minutes of face-to-face time during this encounter reviewing  patient's last visit with me, patient's  most recent visit with nephrology,  cardiology,  hospitalization , orthopedics, previous surgical and non surgical procedures, previous  labs and imaging studies, counseling on currently addressed issues,  and post visit ordering to diagnostics and therapeutics .   Follow-up: No follow-ups on file.   Sherlene Shams, MD

## 2022-12-23 ENCOUNTER — Ambulatory Visit: Payer: Self-pay

## 2022-12-23 LAB — CBC WITH DIFFERENTIAL/PLATELET
Basophils Absolute: 0.1 10*3/uL (ref 0.0–0.1)
Basophils Relative: 1.2 % (ref 0.0–3.0)
Eosinophils Absolute: 0.1 10*3/uL (ref 0.0–0.7)
Eosinophils Relative: 2 % (ref 0.0–5.0)
HCT: 33 % — ABNORMAL LOW (ref 39.0–52.0)
Hemoglobin: 10.8 g/dL — ABNORMAL LOW (ref 13.0–17.0)
Lymphocytes Relative: 13.8 % (ref 12.0–46.0)
Lymphs Abs: 1 10*3/uL (ref 0.7–4.0)
MCHC: 32.8 g/dL (ref 30.0–36.0)
MCV: 99.9 fl (ref 78.0–100.0)
Monocytes Absolute: 0.6 10*3/uL (ref 0.1–1.0)
Monocytes Relative: 8.5 % (ref 3.0–12.0)
Neutro Abs: 5.2 10*3/uL (ref 1.4–7.7)
Neutrophils Relative %: 74.5 % (ref 43.0–77.0)
Platelets: 214 10*3/uL (ref 150.0–400.0)
RBC: 3.31 Mil/uL — ABNORMAL LOW (ref 4.22–5.81)
RDW: 14.6 % (ref 11.5–15.5)
WBC: 7 10*3/uL (ref 4.0–10.5)

## 2022-12-23 LAB — BASIC METABOLIC PANEL
BUN: 46 mg/dL — ABNORMAL HIGH (ref 6–23)
CO2: 25 meq/L (ref 19–32)
Calcium: 8.8 mg/dL (ref 8.4–10.5)
Chloride: 108 meq/L (ref 96–112)
Creatinine, Ser: 1.87 mg/dL — ABNORMAL HIGH (ref 0.40–1.50)
GFR: 30.44 mL/min — ABNORMAL LOW (ref >60.00)
Glucose, Bld: 99 mg/dL (ref 70–99)
Potassium: 3.8 meq/L (ref 3.5–5.1)
Sodium: 143 meq/L (ref 135–145)

## 2022-12-23 LAB — URINE CULTURE
MICRO NUMBER:: 15393882
SPECIMEN QUALITY:: ADEQUATE

## 2022-12-23 LAB — URINALYSIS, ROUTINE W REFLEX MICROSCOPIC
Bilirubin Urine: NEGATIVE
Hgb urine dipstick: NEGATIVE
Ketones, ur: NEGATIVE
Nitrite: NEGATIVE
Specific Gravity, Urine: 1.015 (ref 1.000–1.030)
Total Protein, Urine: 30 — AB
Urine Glucose: NEGATIVE
Urobilinogen, UA: 0.2 (ref 0.0–1.0)
pH: 6.5 (ref 5.0–8.0)

## 2022-12-23 LAB — IRON,TIBC AND FERRITIN PANEL
%SAT: 15 % — ABNORMAL LOW (ref 20–48)
Ferritin: 105 ng/mL (ref 24–380)
Iron: 34 ug/dL — ABNORMAL LOW (ref 50–180)
TIBC: 232 ug/dL — ABNORMAL LOW (ref 250–425)

## 2022-12-23 LAB — BRAIN NATRIURETIC PEPTIDE: Brain Natriuretic Peptide: 763 pg/mL — ABNORMAL HIGH (ref <100)

## 2022-12-23 NOTE — Patient Outreach (Signed)
  Care Coordination   Follow Up Visit Note   12/23/2022 Name: Eric Hicks MRN: [id number] DOB: 28-Jul-1926  Eric Hicks is a 89 y.o. year old male who sees Darrick Huntsman, Mar Daring, MD for primary care. I spoke with  Eric Hicks by phone today.  What matters to the patients health and wellness today?  Patient states he is doing pretty good.  Reports having right hip pain when ambulating.  He states he has not taken the tramadol yet prescribed by his primary provider. He states he will wait until he has someone with him when he takes it.  Patient denies any increase in fatigue/ weakness. He states his appetite is much better.  He states he is keeping track of his protein and water intake.  Reports swelling in lower extremities.  He states his provider is aware.  Patient states he wears his compression hose and occasionally will elevate his legs when sitting.  Patient reports blood pressures are ranging in the 130's/70's    Goals Addressed             This Visit's Progress    Health management/ education related to fall prevention/ macular degeneration and/ or chronic health conditions.       Interventions Today    Flowsheet Row Most Recent Value  Chronic Disease   Chronic disease during today's visit Other, Hypertension (HTN)  [iron deficiency anemia, right hip pain]  General Interventions   General Interventions Discussed/Reviewed General Interventions Reviewed, Doctor Visits  [evaluation of current treatment plan for iron deficiency anemia/ right hip pain and patients adherence to plan as established by provider. Assessed for fatigue/ weakness, pain level.  Assessed BP readings.]  Doctor Visits Discussed/Reviewed Doctor Visits Reviewed  Annabell Sabal upcoming provider visits. Discussed/ reviewed provider appointment from 12/22/22]  Education Interventions   Education Provided Provided Education  [advised to continue monitoring blood pressures daily and recording.  Advised patient to notify provider  for increasing / frequent BP readings 140/90 due to not being on BP medication at present.]  Provided Verbal Education On Other  [Advised to notify provider if right hip pain not relieved by newly prescribed pain medication tramadol. Advised to continue to wear compression hose and elevate legs when sitting/ laying down.]  Nutrition Interventions   Nutrition Discussed/Reviewed Nutrition Reviewed  [Assessed appetite.  Emphasized staying hydrated and eating fruits, vegetables and increasing protein in diet. Discussed current patients water and protein intake]  Pharmacy Interventions   Pharmacy Dicussed/Reviewed Pharmacy Topics Reviewed  [medications reviewed /discussed importance of compliance.  Emphasized importance of not taking medications lasix/ aleve ( anti inflammatories) that can effect health of kidneys. As provider recommendation take first dose tramadol with family present.]              SDOH assessments and interventions completed:  No     Care Coordination Interventions:  Yes, provided   Follow up plan: Follow up call scheduled for 02/07/23    Encounter Outcome:  Pt. Visit Completed   George Ina RN,BSN,CCM [facility] Care Coordination [phone] direct line

## 2022-12-23 NOTE — Patient Instructions (Signed)
Visit Information  Thank you for taking time to visit with me today. Please don't hesitate to contact me if I can be of assistance to you.   Following are the goals we discussed today:   Goals Addressed             This Visit's Progress    Health management/ education related to fall prevention/ macular degeneration and/ or chronic health conditions.       Interventions Today    Flowsheet Row Most Recent Value  Chronic Disease   Chronic disease during today's visit Other, Hypertension (HTN)  [iron deficiency anemia, right hip pain]  General Interventions   General Interventions Discussed/Reviewed General Interventions Reviewed, Doctor Visits  [evaluation of current treatment plan for iron deficiency anemia/ right hip pain and patients adherence to plan as established by provider. Assessed for fatigue/ weakness, pain level.  Assessed BP readings.]  Doctor Visits Discussed/Reviewed Doctor Visits Reviewed  Annabell Sabal upcoming provider visits. Discussed/ reviewed provider appointment from 12/22/22]  Education Interventions   Education Provided Provided Education  [advised to continue monitoring blood pressures daily and recording.  Advised patient to notify provider for increasing / frequent BP readings 140/90 due to not being on BP medication at present.]  Provided Verbal Education On Other  [Advised to notify provider if right hip pain not relieved by newly prescribed pain medication tramadol. Advised to continue to wear compression hose and elevate legs when sitting/ laying down.]  Nutrition Interventions   Nutrition Discussed/Reviewed Nutrition Reviewed  [Assessed appetite.  Emphasized staying hydrated and eating fruits, vegetables and increasing protein in diet. Discussed current patients water and protein intake]  Pharmacy Interventions   Pharmacy Dicussed/Reviewed Pharmacy Topics Reviewed  [medications reviewed /discussed importance of compliance.  Emphasized importance of not taking  medications lasix/ aleve ( anti inflammatories) that can effect health of kidneys. As provider recommendation take first dose tramadol with family present.]              Our next appointment is by telephone on 02/07/23 at 10 am  Please call the care guide team at [phone] if you need to cancel or reschedule your appointment.   If you are experiencing a Mental Health or Behavioral Health Crisis or need someone to talk to, please call the Suicide and Crisis Lifeline: 988 call 1-800-273-TALK (toll free, 24 hour hotline)  Patient verbalizes understanding of instructions and care plan provided today and agrees to view in MyChart. Active MyChart status and patient understanding of how to access instructions and care plan via MyChart confirmed with patient.     George Ina RN,BSN,CCM [facility] Care Coordination [phone] direct line

## 2022-12-23 NOTE — Assessment & Plan Note (Signed)
Last known EF 30 to 35%.  Despite suspension of diuretics due to acute renal failure in late July, he has clinical signs of failure limited to lower extremity 1+ pitting edema .   Cr had not returned to baseline as of August 9  repeat analysis of GFR is pending   Lab Results  Component Value Date   CREATININE 2.06 (H) 11/23/2022   .

## 2022-12-23 NOTE — Assessment & Plan Note (Signed)
Complicated by CKD.  He has been taking iron supplements.  Repeat levels are pending  Lab Results  Component Value Date   WBC 8.8 11/19/2022   HGB 10.5 (L) 11/19/2022   HCT 30.2 (L) 11/19/2022   MCV 95.9 11/19/2022   PLT 216 11/19/2022   Lab Results  Component Value Date   IRON 34 (L) 12/22/2022   TIBC 232 (L) 12/22/2022   FERRITIN 105 12/22/2022

## 2022-12-23 NOTE — Assessment & Plan Note (Signed)
Managed with hormone suppression.  Receiving Lupron.  He is followed by Francia Greaves w

## 2022-12-24 DIAGNOSIS — N3090 Cystitis, unspecified without hematuria: Secondary | ICD-10-CM | POA: Insufficient documentation

## 2022-12-24 MED ORDER — FUROSEMIDE 20 MG PO TABS: 20.0000 mg | ORAL_TABLET | ORAL | 0 refills | Status: DC

## 2022-12-24 MED ORDER — CIPROFLOXACIN HCL 500 MG PO TABS: 500.0000 mg | ORAL_TABLET | Freq: Every day | ORAL | 0 refills | Status: AC

## 2022-12-24 NOTE — Addendum Note (Signed)
Addended by: Sherlene Shams on: 12/24/2022 04:00 PM   Modules accepted: Orders

## 2022-12-24 NOTE — Assessment & Plan Note (Signed)
GFR improving but not at prior baseline.  GFR 30 .  Resume furosemide at reduced dose of 20 mg every other day

## 2022-12-24 NOTE — Assessment & Plan Note (Addendum)
RX CIPRO 500 MG DAILY X 7 DAYS  FOR pyuria on UA.  Culture pending

## 2023-01-10 ENCOUNTER — Ambulatory Visit (INDEPENDENT_AMBULATORY_CARE_PROVIDER_SITE_OTHER): Payer: Medicare Other

## 2023-01-10 DIAGNOSIS — E538 Deficiency of other specified B group vitamins: Secondary | ICD-10-CM

## 2023-01-10 DIAGNOSIS — H34812 Central retinal vein occlusion, left eye, with macular edema: Secondary | ICD-10-CM | POA: Diagnosis not present

## 2023-01-10 MED ORDER — CYANOCOBALAMIN 1000 MCG/ML IJ SOLN
1000.0000 ug | Freq: Once | INTRAMUSCULAR | Status: AC
Start: 2023-01-10 — End: 2023-01-10
  Administered 2023-01-10: 1000 ug via INTRAMUSCULAR

## 2023-01-10 NOTE — Progress Notes (Signed)
Pt presented for their vitamin B12 injection. Pt was identified through two identifiers. Pt tolerated shot well in their right deltoid.

## 2023-01-21 ENCOUNTER — Ambulatory Visit
Admission: RE | Admit: 2023-01-21 | Discharge: 2023-01-21 | Disposition: A | Discharge status: Home / Self Care | Payer: Medicare Other | Source: Ambulatory Visit | Attending: Oncology | Admitting: Oncology

## 2023-01-21 DIAGNOSIS — J90 Pleural effusion, not elsewhere classified: Secondary | ICD-10-CM | POA: Diagnosis not present

## 2023-01-21 DIAGNOSIS — C3411 Malignant neoplasm of upper lobe, right bronchus or lung: Secondary | ICD-10-CM | POA: Insufficient documentation

## 2023-01-21 DIAGNOSIS — I77810 Thoracic aortic ectasia: Secondary | ICD-10-CM | POA: Diagnosis not present

## 2023-01-21 LAB — POCT I-STAT CREATININE: Creatinine, Ser: 1.7 mg/dL — ABNORMAL HIGH (ref 0.61–1.24)

## 2023-01-21 MED ORDER — IOHEXOL 300 MG/ML  SOLN
60.0000 mL | Freq: Once | INTRAMUSCULAR | Status: AC | PRN
  Administered 2023-01-21: 60 mL via INTRAVENOUS

## 2023-01-26 ENCOUNTER — Other Ambulatory Visit: Payer: Self-pay | Admitting: Internal Medicine

## 2023-01-26 NOTE — Telephone Encounter (Signed)
Medication was discontinued on 09/22/2022. Is it okay to refuse?

## 2023-01-28 ENCOUNTER — Inpatient Hospital Stay: Payer: Medicare Other | Admitting: Oncology

## 2023-01-29 IMAGING — CT CT CHEST W/ CM
2 of 4 series · 14 of 36 positions shown, 17 images · IV contrast (APPLIED)
Comparison: 01/09/2021

CLINICAL DATA: Restaging non-small cell lung cancer.

EXAM:
CT CHEST WITH CONTRAST
TECHNIQUE: Multidetector CT imaging of the chest was performed during
intravenous contrast administration.

[Series 2: chest w · axial · 0.76mm/px · z∈[+1168,+1454]mm · 11 of 169 slices shown, 14 images]
[im 13/169  mediastinal]
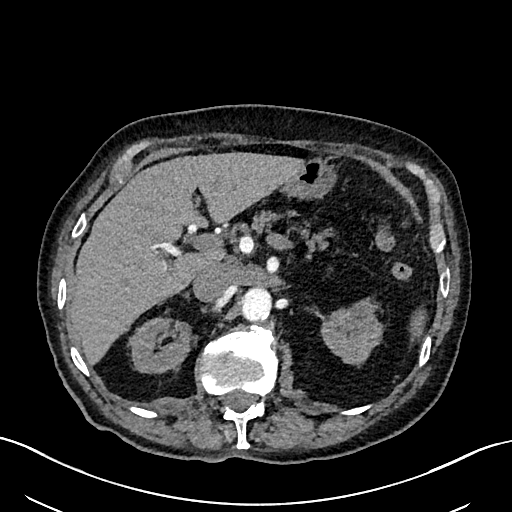
[im 13/169  lung]
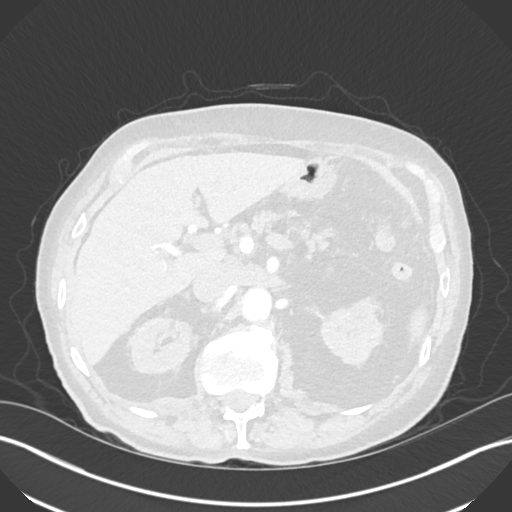
[im 26/169  lung]
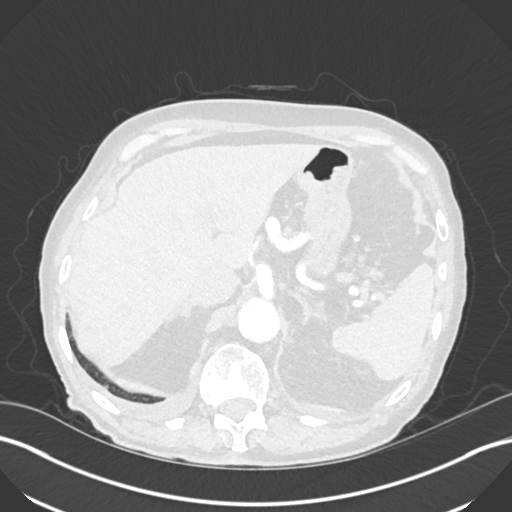
[im 39/169  lung]
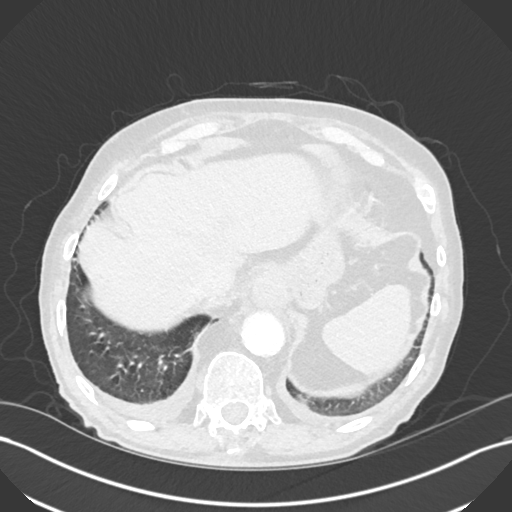
[im 52/169  lung]
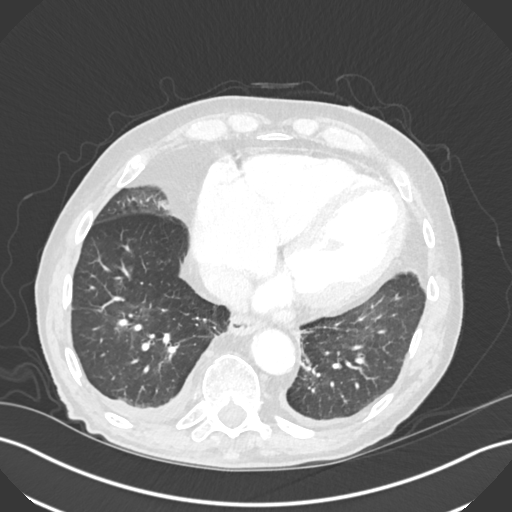
[im 65/169  mediastinal]
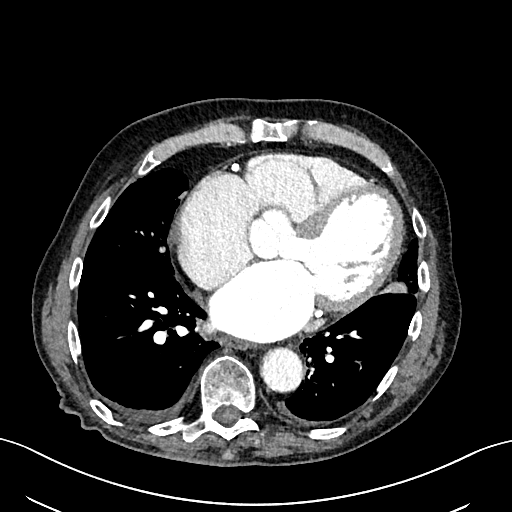
[im 65/169  lung]
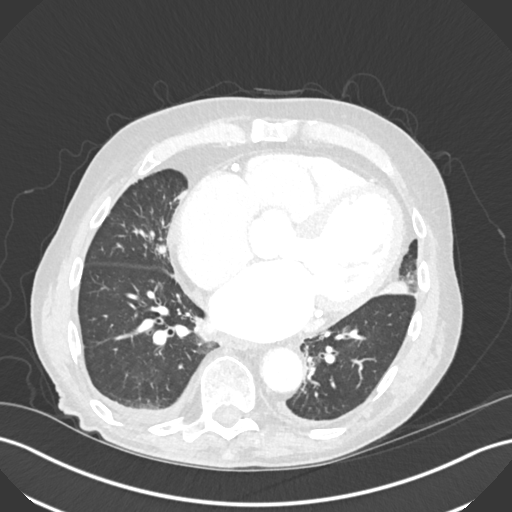
[im 91/169  lung]
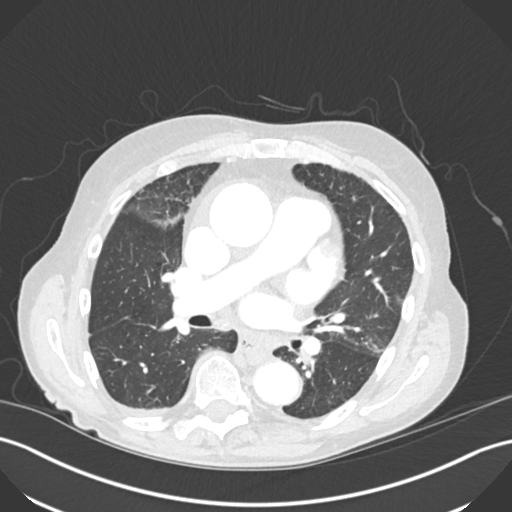
[im 104/169  lung]
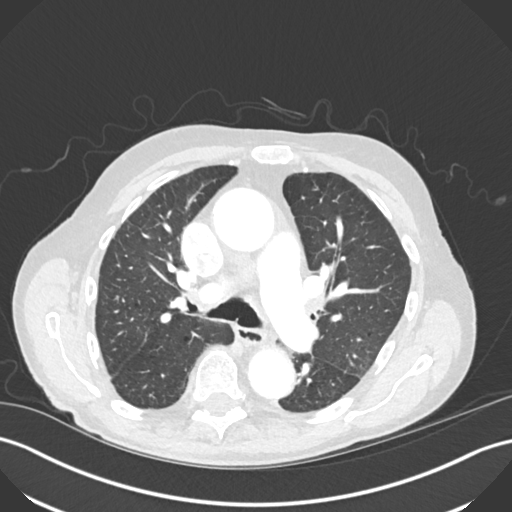
[im 117/169  lung]
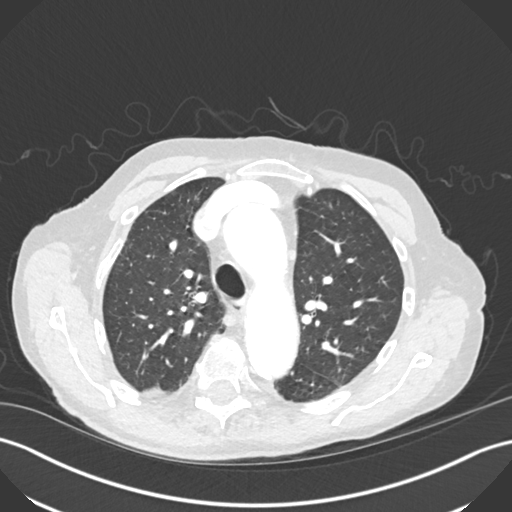
[im 130/169  mediastinal]
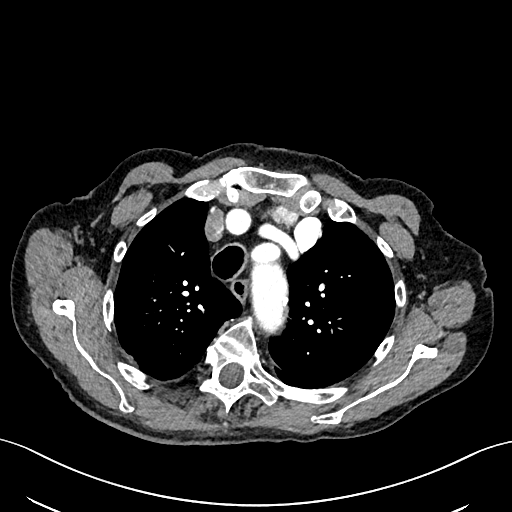
[im 130/169  lung]
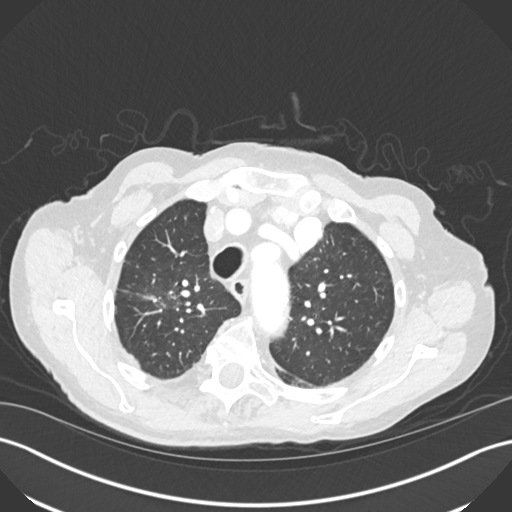
[im 143/169  lung]
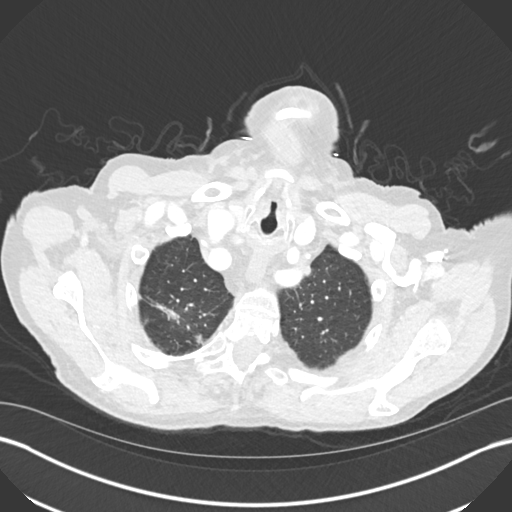
[im 156/169  lung]
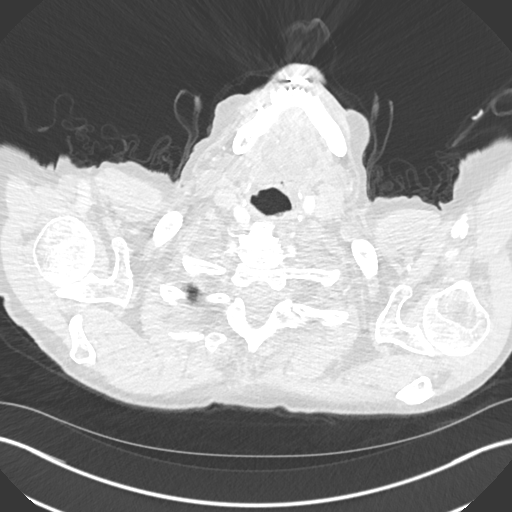

[Series 5: cor · coronal · 0.66mm/px · 3 of 130 slices shown]
[im 26/130  lung]
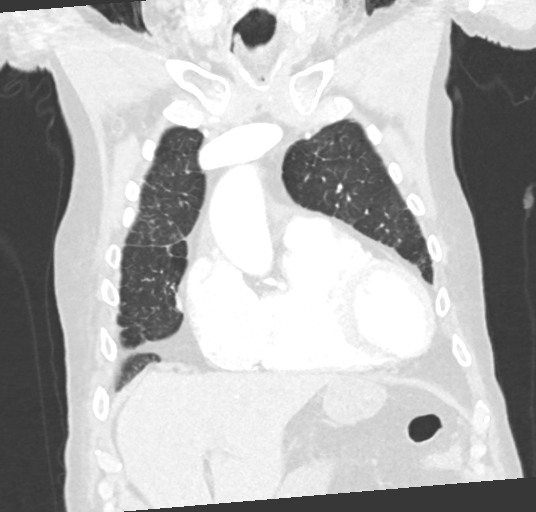
[im 52/130  lung]
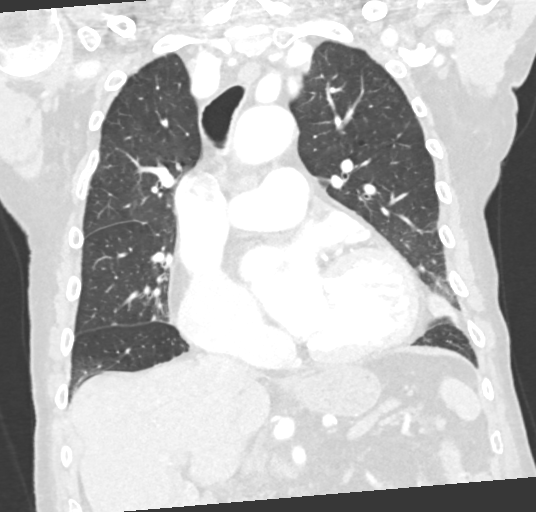
[im 78/130  lung]
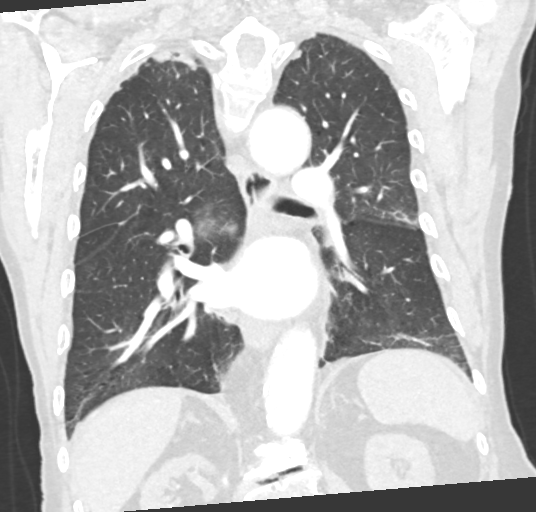

[14 of 36 positions shown; findings below may reference images not displayed]

RADIATION DOSE REDUCTION: This exam was performed according to the
departmental dose-optimization program which includes automated
exposure control, adjustment of the mA and/or kV according to
patient size and/or use of iterative reconstruction technique.

CONTRAST:  50mL OMNIPAQUE IOHEXOL 300 MG/ML  SOLN
FINDINGS: Cardiovascular: Cardiac enlargement. Aneurysmal dilatation of the
ascending thoracic aorta is stable at 4.7 cm, image 79/2. Aortic
atherosclerosis. Coronary artery calcifications.

Mediastinum/Nodes: No enlarged axillary, supraclavicular,
mediastinal, or hilar lymph nodes. The thyroid gland, trachea, and
esophagus demonstrate no significant findings.

Lungs/Pleura: Small bilateral pleural effusions noted. Bilateral
lower lobe ground-glass attenuation and mild interlobular septal
thickening is noted.

-subsolid index lesion within the right upper lobe measures 2.8 x
2.3 cm, image 70/5. This is compared with 2.6 x 1.6 cm previously.
Previous solid component measures 0.8 cm, image 69/2. Unchanged from
previous exam. New separate solid component is identified along the
lateral margin of this lesion measuring 0.7 cm, image 68/5.

-resolution of previous 5 mm right middle lobe lung nodule, likely
postinflammatory or infectious. Scar in volume loss is again seen
within the lingula and right middle lobe.

Upper Abdomen: Normal appearance of the adrenal glands. Bilateral
kidney stones are again noted. The largest is in the upper pole of
right kidney measuring 0.9 cm. Aortic atherosclerotic
calcifications.

Musculoskeletal: No acute or significant osseous findings. Unchanged
appearance of L2 compression deformity. Remote sternal fracture is
again noted and appears similar.
IMPRESSION: 1. Slight increase in size of part solid index lesion within the
right upper lobe. There is a new solid component along the lateral
margin of this lesion measuring 7 mm. The separate, previously
referenced solid component associated with this lesion is stable. As
mentioned previously these findings are worrisome for either
low-grade pulmonary adenocarcinoma.
2. Resolution of previous 5 mm right middle lobe lung nodule, likely
postinflammatory or infectious.
3. Mild congestive heart failure with small bilateral pleural
effusions and bilateral areas of ground-glass attenuation and lower
lobe interlobular septal thickening.
4. Stable L2 compression deformity.
5. Aortic Atherosclerosis (PR736-M1G.G).
6. Stable 4.7 cm ascending thoracic aortic aneurysm. Ascending
thoracic aortic aneurysm. Recommend semi-annual imaging followup by
CTA or MRA and referral to cardiothoracic surgery if not already
obtained. This recommendation follows 2989
ACCF/AHA/AATS/ACR/ASA/SCA/WENDEE/POLIN/DREY/MAKI Guidelines for the
Diagnosis and Management of Patients With Thoracic Aortic Disease.
Circulation. 2989; 121: E266-e369. Aortic aneurysm NOS (PR736-ITU.N)

## 2023-02-03 ENCOUNTER — Inpatient Hospital Stay: Payer: Medicare Other | Attending: Oncology | Admitting: Oncology

## 2023-02-03 ENCOUNTER — Encounter: Payer: Self-pay | Admitting: Oncology

## 2023-02-03 VITALS — BP 166/94 | HR 81 | Temp 97.6°F | Resp 16 | Ht 70.0 in | Wt 158.0 lb

## 2023-02-03 DIAGNOSIS — K449 Diaphragmatic hernia without obstruction or gangrene: Secondary | ICD-10-CM | POA: Insufficient documentation

## 2023-02-03 DIAGNOSIS — Z833 Family history of diabetes mellitus: Secondary | ICD-10-CM | POA: Insufficient documentation

## 2023-02-03 DIAGNOSIS — Z8249 Family history of ischemic heart disease and other diseases of the circulatory system: Secondary | ICD-10-CM | POA: Insufficient documentation

## 2023-02-03 DIAGNOSIS — N200 Calculus of kidney: Secondary | ICD-10-CM | POA: Diagnosis not present

## 2023-02-03 DIAGNOSIS — Z886 Allergy status to analgesic agent status: Secondary | ICD-10-CM | POA: Diagnosis not present

## 2023-02-03 DIAGNOSIS — J90 Pleural effusion, not elsewhere classified: Secondary | ICD-10-CM | POA: Insufficient documentation

## 2023-02-03 DIAGNOSIS — I119 Hypertensive heart disease without heart failure: Secondary | ICD-10-CM | POA: Insufficient documentation

## 2023-02-03 DIAGNOSIS — Z808 Family history of malignant neoplasm of other organs or systems: Secondary | ICD-10-CM | POA: Insufficient documentation

## 2023-02-03 DIAGNOSIS — D649 Anemia, unspecified: Secondary | ICD-10-CM | POA: Insufficient documentation

## 2023-02-03 DIAGNOSIS — Z7951 Long term (current) use of inhaled steroids: Secondary | ICD-10-CM | POA: Insufficient documentation

## 2023-02-03 DIAGNOSIS — I700 Atherosclerosis of aorta: Secondary | ICD-10-CM | POA: Insufficient documentation

## 2023-02-03 DIAGNOSIS — M954 Acquired deformity of chest and rib: Secondary | ICD-10-CM | POA: Diagnosis not present

## 2023-02-03 DIAGNOSIS — J449 Chronic obstructive pulmonary disease, unspecified: Secondary | ICD-10-CM | POA: Diagnosis not present

## 2023-02-03 DIAGNOSIS — K753 Granulomatous hepatitis, not elsewhere classified: Secondary | ICD-10-CM | POA: Insufficient documentation

## 2023-02-03 DIAGNOSIS — Z96641 Presence of right artificial hip joint: Secondary | ICD-10-CM | POA: Insufficient documentation

## 2023-02-03 DIAGNOSIS — Z79899 Other long term (current) drug therapy: Secondary | ICD-10-CM | POA: Insufficient documentation

## 2023-02-03 DIAGNOSIS — C3411 Malignant neoplasm of upper lobe, right bronchus or lung: Secondary | ICD-10-CM | POA: Diagnosis not present

## 2023-02-03 DIAGNOSIS — Z923 Personal history of irradiation: Secondary | ICD-10-CM | POA: Diagnosis not present

## 2023-02-03 NOTE — Progress Notes (Signed)
[facility]  Telephone:([phone]) [phone] Fax:(336) [phone]  ID: Eric Hicks OB: Mar 15, 1927  MR#: [id number]  NIO#:[id number]  Patient Care Team: Sherlene Shams, MD as PCP - General (Internal Medicine) Glory Buff, RN as Oncology Nurse Navigator Otho Ket, RN as [facility] Nicholaus Corolla, MD as Referring Physician (Ophthalmology)  CHIEF COMPLAINT: Right upper lobe lung mass.  INTERVAL HISTORY: Patient returns to clinic today for routine 28-month evaluation and discussion of his imaging results.  He continues to feel well and remains asymptomatic.  He continues to remain active and live on his own.  He has no neurologic complaints.  He denies any recent fevers or illnesses.  He has a good appetite and denies weight loss.  He has no chest pain, shortness of breath, cough, or hemoptysis.  He denies any nausea, vomiting, constipation, or diarrhea.  He has no urinary complaints.  Patient offers no specific complaints today.  REVIEW OF SYSTEMS:   Review of Systems  Constitutional: Negative.  Negative for fever, malaise/fatigue and weight loss.  Respiratory: Negative.  Negative for cough, hemoptysis and shortness of breath.   Cardiovascular: Negative.  Negative for chest pain and leg swelling.  Gastrointestinal: Negative.  Negative for abdominal pain.  Genitourinary: Negative.  Negative for dysuria.  Musculoskeletal: Negative.  Negative for back pain.  Skin: Negative.  Negative for rash.  Neurological: Negative.  Negative for dizziness, focal weakness, weakness and headaches.  Psychiatric/Behavioral: Negative.  The patient is not nervous/anxious.     As per HPI. Otherwise, a complete review of systems is negative.  PAST MEDICAL HISTORY: Past Medical History:  Diagnosis Date   BPH (benign prostatic hyperplasia)    managed by Garner Nash   COPD (chronic obstructive pulmonary disease) (HCC)    Elevated PSA, between 10 and less than 20 ng/ml     Garner Nash, watchful waiting   Hypertension    Osteoporosis    by DEXA    PAST SURGICAL HISTORY: Past Surgical History:  Procedure Laterality Date   HIP ARTHROPLASTY Right 03/24/2022   Procedure: ARTHROPLASTY BIPOLAR HIP (HEMIARTHROPLASTY);  Surgeon: Reinaldo Berber, MD;  Location: [facility] ORS;  Service: Orthopedics;  Laterality: Right;   TYMPANOSTOMY TUBE PLACEMENT  2017    FAMILY HISTORY: Family History  Problem Relation Age of Onset   Diabetes Mother    Hypertension Father    Cancer Brother 28       multiple myeloma    ADVANCED DIRECTIVES (Y/N):  N  HEALTH MAINTENANCE: Social History   Tobacco Use   Smoking status: Former   Smokeless tobacco: Never  Advertising account planner   Vaping status: Never Used  Substance Use Topics   Alcohol use: No     Colonoscopy:  PAP:  Bone density:  Lipid panel:  Allergies  Allergen Reactions   Tylenol [Acetaminophen] Rash    Current Outpatient Medications  Medication Sig Dispense Refill   AMBULATORY NON FORMULARY MEDICATION Joint Advantage Gold 2 tablets daily     atorvastatin (LIPITOR) 20 MG tablet TAKE 1 TABLET BY MOUTH EVERY OTHER DAY (Patient taking differently: Take 20 mg by mouth every other day. qhs) 45 tablet 3   brimonidine (ALPHAGAN) 0.2 % ophthalmic solution 1 drop 3 (three) times daily.     calcium carbonate (OS-CAL) 600 MG TABS Take 600 mg by mouth daily with breakfast.      dorzolamide-timolol (COSOPT) 22.3-6.8 MG/ML ophthalmic solution TAKE 1 DROP(S) IN RIGHT EYE 2 TIMES A DAY  5   ferrous sulfate  325 (65 FE) MG tablet Take 1 tablet by mouth daily.     fluticasone (FLONASE) 50 MCG/ACT nasal spray Place 1 spray into both nostrils daily.     fluticasone-salmeterol (WIXELA INHUB) 100-50 MCG/ACT AEPB Inhale 1 puff into the lungs 2 (two) times daily. 180 each 1   furosemide (LASIX) 20 MG tablet Take 1 tablet (20 mg total) by mouth every other day. 45 tablet 0   latanoprost (XALATAN) 0.005 % ophthalmic solution Place 1 drop into  both eyes at bedtime.  5   leuprolide, 6 Month, (ELIGARD) 45 MG injection Inject 45 mg into the skin every 6 (six) months.     mirtazapine (REMERON) 30 MG tablet Take 30 mg by mouth at bedtime.     Vitamin D, Cholecalciferol, 25 MCG (1000 UT) CAPS Take 1 capsule by mouth daily.     No current facility-administered medications for this visit.    OBJECTIVE: Vitals:   02/03/23 1041  BP: (!) 166/94  Pulse: 81  Resp: 16  Temp: 97.6 F (36.4 C)  SpO2: 99%     Body mass index is 22.67 kg/m.    ECOG FS:0 - Asymptomatic  General: Well-developed, well-nourished, no acute distress. Eyes: Pink conjunctiva, anicteric sclera. HEENT: Normocephalic, moist mucous membranes. Lungs: No audible wheezing or coughing. Heart: Regular rate and rhythm. Abdomen: Soft, nontender, no obvious distention. Musculoskeletal: No edema, cyanosis, or clubbing. Neuro: Alert, answering all questions appropriately. Cranial nerves grossly intact. Skin: No rashes or petechiae noted. Psych: Normal affect.  LAB RESULTS:  Lab Results  Component Value Date   NA 143 12/22/2022   K 3.8 12/22/2022   CL 108 12/22/2022   CO2 25 12/22/2022   GLUCOSE 99 12/22/2022   BUN 46 (H) 12/22/2022   CREATININE 1.70 (H) 01/21/2023   CALCIUM 8.8 12/22/2022   PROT 5.9 (L) 11/17/2022   ALBUMIN 3.2 (L) 11/17/2022   AST 20 11/17/2022   ALT 15 11/17/2022   ALKPHOS 51 11/17/2022   BILITOT 0.7 11/17/2022   GFRNONAA 28 (L) 11/20/2022   GFRAA 40 (L) 11/12/2011    Lab Results  Component Value Date   WBC 7.0 12/22/2022   NEUTROABS 5.2 12/22/2022   HGB 10.8 (L) 12/22/2022   HCT 33.0 (L) 12/22/2022   MCV 99.9 12/22/2022   PLT 214.0 12/22/2022     STUDIES: CT Chest W Contrast  Result Date: 02/02/2023 CLINICAL DATA:  Malignant neoplasm right upper lobe of the lung. History of radiation therapy. * Tracking Code: BO * EXAM: CT CHEST WITH CONTRAST TECHNIQUE: Multidetector CT imaging of the chest was performed during intravenous  contrast administration. RADIATION DOSE REDUCTION: This exam was performed according to the departmental dose-optimization program which includes automated exposure control, adjustment of the mA and/or kV according to patient size and/or use of iterative reconstruction technique. CONTRAST:  60mL OMNIPAQUE IOHEXOL 300 MG/ML  SOLN COMPARISON:  07/12/2022 CT scan and older FINDINGS: Cardiovascular: Heart is enlarged. Trace pericardial fluid and thickening. Coronary artery calcifications are identified. Atherosclerotic changes along the aorta. Diameter of the ascending aorta today approaches 4.7 by 4.7 cm. Unchanged from previous. Some ectasia of the main pulmonary artery as well. Please correlate for evidence of pulmonary artery hypertension. The distal branch of the pulmonary artery in the right upper lobe supplying the area of opacity is poorly enhancing could be thrombosed. This has somewhat new from previous when adjusting for technique. Mediastinum/Nodes: No specific abnormal lymph node enlargement identified in the axillary regions, hilum or mediastinum. There are a  few small less than 1 cm in size nodes identified in the mediastinum, nonpathologic by size criteria. Small hiatal hernia. Small thyroid gland. Lungs/Pleura: There are small bilateral pleural effusions, right-greater-than-left. The right effusion is slightly larger today than previous. There are some dependent areas of scarring atelectatic changes diffusely. There are some areas of bronchial wall thickening in the lower lobes. The masslike opacity in the right lung apex medially with lung distortion and pleural thickening has a similar distribution and appearance compared to the previous examination. The amount of nodular soft tissue overall appears to be increased however on series 3, image 28 measuring 4.0 by 2.3 cm. More confluence in overall appearance when adjusting for technique. There are several tiny nodules identified elsewhere in the middle  lobe and left lower lobe which are new. Example in the middle lobe on series 3, image 84 nodule measures 7 by 5 mm. Adjacent foci on image 84 as well, image 89, image 95 in the middle lobe. There are some similar areas in the left lower lobe which are new such as series 3, image 98 measuring 6 mm, 5 mm series 3, image 109. Right lower lobe new focus on series 3, image 103 medially measures 5 mm. Upper Abdomen: Along the upper abdomen the adrenal glands are preserved. Hepatic granuloma. Bilateral nonobstructing renal stones. Musculoskeletal: Anasarca. Curvature of the spine with some degenerative changes. Chronic deformity of the lower sternum is again seen. Old rib fractures are again seen. IMPRESSION: Slight increasing confluence of nodular area in the medial right lung base. In addition there are several new small lung nodules identified elsewhere such as the middle lobe, right lower lobe and left lower lobe. These are of uncertain etiology. A malignant process is possible. Recommend either short follow-up or PET-CT scan as clinically directed. Of note there is poor enhancement of the supplying pulmonary arterial distal branch vessel to the area of opacity in the right lung apex. This could be physiologic response/thrombus. Slight increase in the small right pleural effusion. Again noted is an enlarged, dilated ascending aorta of up to 4.7 cm. Recommend semi-annual imaging followup by CTA or MRA and referral to cardiothoracic surgery if not already obtained. This recommendation follows 2010 ACCF/AHA/AATS/ACR/ASA/SCA/SCAI/SIR/STS/SVM Guidelines for the Diagnosis and Management of Patients With Thoracic Aortic Disease. Circulation. 2010; 121: N829-F62. Aortic aneurysm NOS (ICD10-I71.9) Enlarged pulmonary arteries.  Enlarged heart. Renal stones.  Small hiatal hernia. Aortic Atherosclerosis (ICD10-I70.0). Electronically Signed   By: Karen Kays M.D.   On: 02/02/2023 16:52     ASSESSMENT: Right upper lobe lung  mass.  PLAN:    Right upper lobe lung mass: CT scan results from January 09, 2021 reviewed independently with essentially unchanged lesion of right upper lobe, although the lesion had been noted to increase in size since 2020.  Suspicion was a low-grade indolent bronchogenic carcinoma and patient elected to forego biopsy and undergo radiation treatments which completed in approximately May 2023.  His most recent CT scan on February 02, 2023 reviewed independently and reported as above with an increased confluence of a nodular area in the right lower lobe.  No intervention is needed will repeat CT scan in 3 months rather than 6 to assess for interval change.  Return to clinic 1 week after his imaging to discuss the results.    Renal insufficiency: Patient's most recent creatinine is 1.7.  This is improved from previous measurements.  Continue monitoring and treatment per primary care. Anemia: Mild, monitor.  Patient's most recent hemoglobin  is 10.8. Hypertension: Patient's blood pressure is moderately elevated today.  Continue monitoring and treatment per primary care.   Patient expressed understanding and was in agreement with this plan. He also understands that He can call clinic at any time with any questions, concerns, or complaints.    Cancer Staging  No matching staging information was found for the patient.   Jeralyn Ruths, MD   02/03/2023 12:03 PM

## 2023-02-07 ENCOUNTER — Ambulatory Visit: Payer: Self-pay

## 2023-02-07 NOTE — Patient Outreach (Signed)
Care Coordination   Follow Up Visit Note   02/07/2023 Name: Eric Hicks MRN: [id number] DOB: [birth year]  Eric Hicks is a 89 y.o. year old male who sees Darrick Huntsman, Mar Daring, MD for primary care. I spoke with  Eric Hicks by phone today.  What matters to the patients health and wellness today?  Patient states he is doing well. He reports having follow up visit with oncologist 02/03/23.  He states his blood pressure was a little elevated at that visit.  Patient reports blood pressure readings since that visit have been:  126/82, 147/88, 138/80, 144/58.  Patient states he is not on any blood pressure medicine at this time.  He reports restarting his lasix as instructed by his provider.   Patient states he took his blood pressure monitor to his primary provider office to be checked for accuracy.  Confirmed BP monitor working.  Patient states he continues to have mild swelling in feet and ankles.   He reports he is adhering to a low salt diet and wears his compression stockings during the day.    Goals Addressed             This Visit's Progress    Health management/ education related to fall prevention/ macular degeneration and/ or chronic health conditions.       Interventions Today    Flowsheet Row Most Recent Value  Chronic Disease   Chronic disease during today's visit Hypertension (HTN)  General Interventions   General Interventions Discussed/Reviewed General Interventions Reviewed, Doctor Visits  [evaluation of current treatment plan for HTN and patients adherence to plan as established by provider. Assessed bp readings and for any new or ongoing symptoms.]  Doctor Visits Discussed/Reviewed Doctor Visits Reviewed  [Discussed upcoming provider visits.]  Education Interventions   Education Provided --  [Advised to continue to monitor blood pressure reporting blood pressures to provider that are outside of established parameters.  Advised to continue wearing compression stockings and  elevate legs when sitting/ laying down to help decrease swelling.]  Nutrition Interventions   Nutrition Discussed/Reviewed Decreasing salt, Nutrition Reviewed  Pharmacy Interventions   Pharmacy Dicussed/Reviewed Pharmacy Topics Reviewed  [Confirmed patient restarted lasix as advised by provider.]              SDOH assessments and interventions completed:  No     Care Coordination Interventions:  Yes, provided   Follow up plan: Follow up call scheduled for 04/05/23    Encounter Outcome:  Patient Visit Completed   George Ina RN,BSN,CCM [facility] Care Coordination [phone] direct line

## 2023-02-07 NOTE — Patient Instructions (Signed)
Visit Information  Thank you for taking time to visit with me today. Please don't hesitate to contact me if I can be of assistance to you.   Following are the goals we discussed today:   Goals Addressed             This Visit's Progress    Health management/ education related to fall prevention/ macular degeneration and/ or chronic health conditions.       Interventions Today    Flowsheet Row Most Recent Value  Chronic Disease   Chronic disease during today's visit Hypertension (HTN)  General Interventions   General Interventions Discussed/Reviewed General Interventions Reviewed, Doctor Visits  [evaluation of current treatment plan for HTN and patients adherence to plan as established by provider. Assessed bp readings and for any new or ongoing symptoms.]  Doctor Visits Discussed/Reviewed Doctor Visits Reviewed  [Discussed upcoming provider visits.]  Education Interventions   Education Provided --  [Advised to continue to monitor blood pressure reporting blood pressures to provider that are outside of established parameters.  Advised to continue wearing compression stockings and elevate legs when sitting/ laying down to help decrease swelling.]  Nutrition Interventions   Nutrition Discussed/Reviewed Decreasing salt, Nutrition Reviewed  Pharmacy Interventions   Pharmacy Dicussed/Reviewed Pharmacy Topics Reviewed  [Confirmed patient restarted lasix as advised by provider.]              Our next appointment is by telephone on 04/05/23 at 10 am  Please call the care guide team at [phone] if you need to cancel or reschedule your appointment.   If you are experiencing a Mental Health or Behavioral Health Crisis or need someone to talk to, please call the Suicide and Crisis Lifeline: 988 call 1-800-273-TALK (toll free, 24 hour hotline)  Patient verbalizes understanding of instructions and care plan provided today and agrees to view in MyChart. Active MyChart status and  patient understanding of how to access instructions and care plan via MyChart confirmed with patient.     George Ina RN,BSN,CCM [facility] Care Coordination [phone] direct line

## 2023-02-13 ENCOUNTER — Other Ambulatory Visit: Payer: Self-pay | Admitting: Internal Medicine

## 2023-02-14 ENCOUNTER — Ambulatory Visit (INDEPENDENT_AMBULATORY_CARE_PROVIDER_SITE_OTHER): Payer: Medicare Other

## 2023-02-14 DIAGNOSIS — E538 Deficiency of other specified B group vitamins: Secondary | ICD-10-CM | POA: Diagnosis not present

## 2023-02-14 MED ORDER — CYANOCOBALAMIN 1000 MCG/ML IJ SOLN
1000.0000 ug | Freq: Once | INTRAMUSCULAR | Status: AC
  Administered 2023-02-14: 1000 ug via INTRAMUSCULAR

## 2023-02-14 NOTE — Progress Notes (Signed)
Pt presented for their vitamin B12 injection. Pt was identified through two identifiers. Pt tolerated shot well in their left  deltoid.  

## 2023-02-21 DIAGNOSIS — H401124 Primary open-angle glaucoma, left eye, indeterminate stage: Secondary | ICD-10-CM | POA: Diagnosis not present

## 2023-02-21 DIAGNOSIS — H401112 Primary open-angle glaucoma, right eye, moderate stage: Secondary | ICD-10-CM | POA: Diagnosis not present

## 2023-02-21 DIAGNOSIS — Z961 Presence of intraocular lens: Secondary | ICD-10-CM | POA: Diagnosis not present

## 2023-02-21 DIAGNOSIS — H34812 Central retinal vein occlusion, left eye, with macular edema: Secondary | ICD-10-CM | POA: Diagnosis not present

## 2023-02-23 DIAGNOSIS — R0602 Shortness of breath: Secondary | ICD-10-CM | POA: Diagnosis not present

## 2023-02-23 DIAGNOSIS — N182 Chronic kidney disease, stage 2 (mild): Secondary | ICD-10-CM | POA: Diagnosis not present

## 2023-02-23 DIAGNOSIS — J439 Emphysema, unspecified: Secondary | ICD-10-CM | POA: Diagnosis not present

## 2023-02-23 DIAGNOSIS — I420 Dilated cardiomyopathy: Secondary | ICD-10-CM | POA: Diagnosis not present

## 2023-02-23 DIAGNOSIS — N184 Chronic kidney disease, stage 4 (severe): Secondary | ICD-10-CM | POA: Diagnosis not present

## 2023-02-23 DIAGNOSIS — I482 Chronic atrial fibrillation, unspecified: Secondary | ICD-10-CM | POA: Diagnosis not present

## 2023-02-23 DIAGNOSIS — I7121 Aneurysm of the ascending aorta, without rupture: Secondary | ICD-10-CM | POA: Diagnosis not present

## 2023-02-23 DIAGNOSIS — I10 Essential (primary) hypertension: Secondary | ICD-10-CM | POA: Diagnosis not present

## 2023-03-01 DIAGNOSIS — I10 Essential (primary) hypertension: Secondary | ICD-10-CM | POA: Diagnosis not present

## 2023-03-01 DIAGNOSIS — D649 Anemia, unspecified: Secondary | ICD-10-CM | POA: Diagnosis not present

## 2023-03-01 DIAGNOSIS — N183 Chronic kidney disease, stage 3 unspecified: Secondary | ICD-10-CM | POA: Diagnosis not present

## 2023-03-14 DIAGNOSIS — H401112 Primary open-angle glaucoma, right eye, moderate stage: Secondary | ICD-10-CM | POA: Diagnosis not present

## 2023-03-14 DIAGNOSIS — H401124 Primary open-angle glaucoma, left eye, indeterminate stage: Secondary | ICD-10-CM | POA: Diagnosis not present

## 2023-03-17 ENCOUNTER — Emergency Department: Payer: Medicare Other

## 2023-03-17 ENCOUNTER — Other Ambulatory Visit: Payer: Self-pay

## 2023-03-17 ENCOUNTER — Inpatient Hospital Stay
Admission: EM | Admit: 2023-03-17 | Discharge: 2023-03-27 | DRG: 542 | Disposition: E | Payer: Medicare Other | Attending: Internal Medicine | Admitting: Internal Medicine

## 2023-03-17 DIAGNOSIS — R296 Repeated falls: Secondary | ICD-10-CM | POA: Diagnosis present

## 2023-03-17 DIAGNOSIS — S0101XA Laceration without foreign body of scalp, initial encounter: Secondary | ICD-10-CM | POA: Diagnosis present

## 2023-03-17 DIAGNOSIS — S32474A Nondisplaced fracture of medial wall of right acetabulum, initial encounter for closed fracture: Secondary | ICD-10-CM | POA: Diagnosis not present

## 2023-03-17 DIAGNOSIS — Z96641 Presence of right artificial hip joint: Secondary | ICD-10-CM | POA: Diagnosis not present

## 2023-03-17 DIAGNOSIS — I10 Essential (primary) hypertension: Secondary | ICD-10-CM | POA: Diagnosis present

## 2023-03-17 DIAGNOSIS — J90 Pleural effusion, not elsewhere classified: Secondary | ICD-10-CM | POA: Diagnosis not present

## 2023-03-17 DIAGNOSIS — R000 Tachycardia, unspecified: Secondary | ICD-10-CM | POA: Diagnosis not present

## 2023-03-17 DIAGNOSIS — N400 Enlarged prostate without lower urinary tract symptoms: Secondary | ICD-10-CM | POA: Diagnosis present

## 2023-03-17 DIAGNOSIS — J440 Chronic obstructive pulmonary disease with acute lower respiratory infection: Secondary | ICD-10-CM | POA: Diagnosis not present

## 2023-03-17 DIAGNOSIS — N183 Chronic kidney disease, stage 3 unspecified: Secondary | ICD-10-CM | POA: Diagnosis present

## 2023-03-17 DIAGNOSIS — Z7189 Other specified counseling: Secondary | ICD-10-CM | POA: Diagnosis not present

## 2023-03-17 DIAGNOSIS — W19XXXA Unspecified fall, initial encounter: Secondary | ICD-10-CM | POA: Diagnosis not present

## 2023-03-17 DIAGNOSIS — Z751 Person awaiting admission to adequate facility elsewhere: Secondary | ICD-10-CM

## 2023-03-17 DIAGNOSIS — S32471A Displaced fracture of medial wall of right acetabulum, initial encounter for closed fracture: Secondary | ICD-10-CM | POA: Diagnosis not present

## 2023-03-17 DIAGNOSIS — Z886 Allergy status to analgesic agent status: Secondary | ICD-10-CM

## 2023-03-17 DIAGNOSIS — Z6822 Body mass index (BMI) 22.0-22.9, adult: Secondary | ICD-10-CM

## 2023-03-17 DIAGNOSIS — G311 Senile degeneration of brain, not elsewhere classified: Secondary | ICD-10-CM | POA: Diagnosis present

## 2023-03-17 DIAGNOSIS — Y92009 Unspecified place in unspecified non-institutional (private) residence as the place of occurrence of the external cause: Secondary | ICD-10-CM | POA: Diagnosis not present

## 2023-03-17 DIAGNOSIS — M25551 Pain in right hip: Secondary | ICD-10-CM | POA: Diagnosis not present

## 2023-03-17 DIAGNOSIS — C3410 Malignant neoplasm of upper lobe, unspecified bronchus or lung: Secondary | ICD-10-CM | POA: Diagnosis present

## 2023-03-17 DIAGNOSIS — R0902 Hypoxemia: Secondary | ICD-10-CM | POA: Diagnosis not present

## 2023-03-17 DIAGNOSIS — M8008XA Age-related osteoporosis with current pathological fracture, vertebra(e), initial encounter for fracture: Secondary | ICD-10-CM | POA: Diagnosis not present

## 2023-03-17 DIAGNOSIS — S12100A Unspecified displaced fracture of second cervical vertebra, initial encounter for closed fracture: Secondary | ICD-10-CM | POA: Diagnosis not present

## 2023-03-17 DIAGNOSIS — J439 Emphysema, unspecified: Secondary | ICD-10-CM | POA: Diagnosis present

## 2023-03-17 DIAGNOSIS — M4802 Spinal stenosis, cervical region: Secondary | ICD-10-CM | POA: Diagnosis not present

## 2023-03-17 DIAGNOSIS — Z8249 Family history of ischemic heart disease and other diseases of the circulatory system: Secondary | ICD-10-CM

## 2023-03-17 DIAGNOSIS — Z79899 Other long term (current) drug therapy: Secondary | ICD-10-CM | POA: Diagnosis not present

## 2023-03-17 DIAGNOSIS — R410 Disorientation, unspecified: Secondary | ICD-10-CM | POA: Diagnosis not present

## 2023-03-17 DIAGNOSIS — Z85118 Personal history of other malignant neoplasm of bronchus and lung: Secondary | ICD-10-CM

## 2023-03-17 DIAGNOSIS — Z807 Family history of other malignant neoplasms of lymphoid, hematopoietic and related tissues: Secondary | ICD-10-CM | POA: Diagnosis not present

## 2023-03-17 DIAGNOSIS — R0682 Tachypnea, not elsewhere classified: Secondary | ICD-10-CM | POA: Diagnosis present

## 2023-03-17 DIAGNOSIS — I672 Cerebral atherosclerosis: Secondary | ICD-10-CM | POA: Diagnosis not present

## 2023-03-17 DIAGNOSIS — R441 Visual hallucinations: Secondary | ICD-10-CM | POA: Diagnosis not present

## 2023-03-17 DIAGNOSIS — W1830XA Fall on same level, unspecified, initial encounter: Secondary | ICD-10-CM | POA: Diagnosis present

## 2023-03-17 DIAGNOSIS — E785 Hyperlipidemia, unspecified: Secondary | ICD-10-CM | POA: Diagnosis not present

## 2023-03-17 DIAGNOSIS — G8929 Other chronic pain: Secondary | ICD-10-CM | POA: Diagnosis present

## 2023-03-17 DIAGNOSIS — E43 Unspecified severe protein-calorie malnutrition: Secondary | ICD-10-CM | POA: Diagnosis present

## 2023-03-17 DIAGNOSIS — J9811 Atelectasis: Secondary | ICD-10-CM | POA: Diagnosis not present

## 2023-03-17 DIAGNOSIS — S12110A Anterior displaced Type II dens fracture, initial encounter for closed fracture: Principal | ICD-10-CM

## 2023-03-17 DIAGNOSIS — Z515 Encounter for palliative care: Secondary | ICD-10-CM

## 2023-03-17 DIAGNOSIS — M80051A Age-related osteoporosis with current pathological fracture, right femur, initial encounter for fracture: Secondary | ICD-10-CM | POA: Diagnosis present

## 2023-03-17 DIAGNOSIS — S72001S Fracture of unspecified part of neck of right femur, sequela: Secondary | ICD-10-CM | POA: Diagnosis not present

## 2023-03-17 DIAGNOSIS — I130 Hypertensive heart and chronic kidney disease with heart failure and stage 1 through stage 4 chronic kidney disease, or unspecified chronic kidney disease: Secondary | ICD-10-CM | POA: Diagnosis not present

## 2023-03-17 DIAGNOSIS — R451 Restlessness and agitation: Secondary | ICD-10-CM | POA: Diagnosis not present

## 2023-03-17 DIAGNOSIS — N184 Chronic kidney disease, stage 4 (severe): Secondary | ICD-10-CM | POA: Diagnosis present

## 2023-03-17 DIAGNOSIS — Z86711 Personal history of pulmonary embolism: Secondary | ICD-10-CM | POA: Diagnosis not present

## 2023-03-17 DIAGNOSIS — S0990XA Unspecified injury of head, initial encounter: Secondary | ICD-10-CM | POA: Diagnosis not present

## 2023-03-17 DIAGNOSIS — R0689 Other abnormalities of breathing: Secondary | ICD-10-CM | POA: Diagnosis not present

## 2023-03-17 DIAGNOSIS — Z87891 Personal history of nicotine dependence: Secondary | ICD-10-CM | POA: Diagnosis not present

## 2023-03-17 DIAGNOSIS — M47811 Spondylosis without myelopathy or radiculopathy, occipito-atlanto-axial region: Secondary | ICD-10-CM | POA: Diagnosis not present

## 2023-03-17 DIAGNOSIS — S12090A Other displaced fracture of first cervical vertebra, initial encounter for closed fracture: Secondary | ICD-10-CM | POA: Diagnosis not present

## 2023-03-17 DIAGNOSIS — E1122 Type 2 diabetes mellitus with diabetic chronic kidney disease: Secondary | ICD-10-CM | POA: Diagnosis not present

## 2023-03-17 DIAGNOSIS — M47812 Spondylosis without myelopathy or radiculopathy, cervical region: Secondary | ICD-10-CM | POA: Diagnosis not present

## 2023-03-17 DIAGNOSIS — S72001A Fracture of unspecified part of neck of right femur, initial encounter for closed fracture: Secondary | ICD-10-CM | POA: Diagnosis present

## 2023-03-17 DIAGNOSIS — I5032 Chronic diastolic (congestive) heart failure: Secondary | ICD-10-CM | POA: Diagnosis present

## 2023-03-17 DIAGNOSIS — Z833 Family history of diabetes mellitus: Secondary | ICD-10-CM | POA: Diagnosis not present

## 2023-03-17 DIAGNOSIS — Z66 Do not resuscitate: Secondary | ICD-10-CM | POA: Diagnosis present

## 2023-03-17 DIAGNOSIS — R918 Other nonspecific abnormal finding of lung field: Secondary | ICD-10-CM | POA: Diagnosis not present

## 2023-03-17 DIAGNOSIS — Z8546 Personal history of malignant neoplasm of prostate: Secondary | ICD-10-CM

## 2023-03-17 DIAGNOSIS — J984 Other disorders of lung: Secondary | ICD-10-CM | POA: Diagnosis not present

## 2023-03-17 DIAGNOSIS — I480 Paroxysmal atrial fibrillation: Secondary | ICD-10-CM | POA: Diagnosis not present

## 2023-03-17 LAB — COMPREHENSIVE METABOLIC PANEL
ALT: 46 U/L — ABNORMAL HIGH (ref 0–44)
AST: 47 U/L — ABNORMAL HIGH (ref 15–41)
Albumin: 3.3 g/dL — ABNORMAL LOW (ref 3.5–5.0)
Alkaline Phosphatase: 90 U/L (ref 38–126)
Anion gap: 10 (ref 5–15)
BUN: 40 mg/dL — ABNORMAL HIGH (ref 8–23)
CO2: 23 mmol/L (ref 22–32)
Calcium: 8.8 mg/dL — ABNORMAL LOW (ref 8.9–10.3)
Chloride: 111 mmol/L (ref 98–111)
Creatinine, Ser: 1.63 mg/dL — ABNORMAL HIGH (ref 0.61–1.24)
GFR, Estimated: 39 mL/min — ABNORMAL LOW (ref >60)
Glucose, Bld: 125 mg/dL — ABNORMAL HIGH (ref 70–99)
Potassium: 4.1 mmol/L (ref 3.5–5.1)
Sodium: 144 mmol/L (ref 135–145)
Total Bilirubin: 1.1 mg/dL (ref <1.2)
Total Protein: 5.7 g/dL — ABNORMAL LOW (ref 6.5–8.1)

## 2023-03-17 LAB — TROPONIN I (HIGH SENSITIVITY)
Troponin I (High Sensitivity): 33 ng/L — ABNORMAL HIGH (ref <18)
Troponin I (High Sensitivity): 42 ng/L — ABNORMAL HIGH (ref <18)

## 2023-03-17 LAB — CBC WITH DIFFERENTIAL/PLATELET
Abs Immature Granulocytes: 0.23 10*3/uL — ABNORMAL HIGH (ref 0.00–0.07)
Basophils Absolute: 0.1 10*3/uL (ref 0.0–0.1)
Basophils Relative: 1 %
Eosinophils Absolute: 0.1 10*3/uL (ref 0.0–0.5)
Eosinophils Relative: 1 %
HCT: 37.6 % — ABNORMAL LOW (ref 39.0–52.0)
Hemoglobin: 12.1 g/dL — ABNORMAL LOW (ref 13.0–17.0)
Immature Granulocytes: 2 %
Lymphocytes Relative: 7 %
Lymphs Abs: 0.8 10*3/uL (ref 0.7–4.0)
MCH: 32.4 pg (ref 26.0–34.0)
MCHC: 32.2 g/dL (ref 30.0–36.0)
MCV: 100.5 fL — ABNORMAL HIGH (ref 80.0–100.0)
Monocytes Absolute: 0.4 10*3/uL (ref 0.1–1.0)
Monocytes Relative: 4 %
Neutro Abs: 9.6 10*3/uL — ABNORMAL HIGH (ref 1.7–7.7)
Neutrophils Relative %: 85 %
Platelets: 171 10*3/uL (ref 150–400)
RBC: 3.74 MIL/uL — ABNORMAL LOW (ref 4.22–5.81)
RDW: 14.5 % (ref 11.5–15.5)
WBC: 11.2 10*3/uL — ABNORMAL HIGH (ref 4.0–10.5)
nRBC: 0 % (ref 0.0–0.2)

## 2023-03-17 MED ORDER — HYDRALAZINE HCL 20 MG/ML IJ SOLN
10.0000 mg | Freq: Once | INTRAMUSCULAR | Status: AC
Start: 2023-03-17 — End: 2023-03-17
  Administered 2023-03-17: 10 mg via INTRAVENOUS
  Filled 2023-03-17: qty 1

## 2023-03-17 MED ORDER — LACTATED RINGERS IV SOLN: INTRAVENOUS | Status: DC

## 2023-03-17 MED ORDER — HYDROMORPHONE HCL 1 MG/ML IJ SOLN
0.5000 mg | INTRAMUSCULAR | Status: DC | PRN
  Administered 2023-03-17 – 2023-03-20 (×4): 0.5 mg via INTRAVENOUS
  Filled 2023-03-17 (×4): qty 0.5

## 2023-03-17 MED ORDER — ONDANSETRON HCL 4 MG/2ML IJ SOLN
4.0000 mg | Freq: Once | INTRAMUSCULAR | Status: AC
  Administered 2023-03-17: 4 mg via INTRAVENOUS
  Filled 2023-03-17: qty 2

## 2023-03-17 MED ORDER — SODIUM CHLORIDE 0.9 % IV SOLN
2.0000 g | INTRAVENOUS | Status: DC
  Administered 2023-03-18 – 2023-03-19 (×2): 2 g via INTRAVENOUS
  Filled 2023-03-17 (×2): qty 20

## 2023-03-17 NOTE — H&P (Incomplete)
History and Physical    Patient: Eric Hicks:[id number] DOB: 1926/09/02 DOA: 03/17/2023 DOS: the patient was seen and examined on 03/17/2023 PCP: Sherlene Shams, MD  Patient coming from: Home  Chief Complaint:  Chief Complaint  Patient presents with  . Fall    Pt attempts to get up from chair @ home and falls from standing resulting in crown of scalp laceration and possible rt hip fx rt hip noted to be turned and shortened laceration appears to have stopped bleeding @ this time pt is aox4     HPI: Eric Hicks is a 89 y.o. male with medical history significant for paroxysmal A-fib, hypertension, COPD coming for fall while he Tried to get up out of wheelchair and fell forward and NS is planning to see for odontoid fracture.  Pt has also sustained a right acetabular fracture that is nonoperative.  At bedside his son is there and patient is somnolent oriented as he just got Dilaudid is in a c-collar was said to be observed until neurosurgery can see him. Patient also has a small laceration on the scalp and his right hand opening   In emergency room vitals trend shows: Vitals:   03/17/23 1920 03/17/23 2100 03/17/23 2326 03/17/23 2345  BP:   (!) 164/104 (!) 145/103  Pulse:  94 93 (!) 104  Temp:      Resp:  12 12   Weight: 72.6 kg     SpO2:  (!) 86% 92%   TempSrc:      EKG shows a.fib 92 no st elevation.  Troponin 33  Labs are notable for metabolic panel shows CKD stage IIIb-4 with creatinine of 1.63, EGFR of 39 glucose 125 albumin 3.3 AST and ALT both elevated at 47 and 46 CBC shows leukocytosis of 11.2 hemoglobin of 12.1 MCV of 100.5. Patient had CT head scalp, laceration noted no acute intracranial process otherwise.IMPRESSION: 1. Type 2 odontoid fracture, with minimal distraction of the fracture site. Otherwise near anatomic alignment. 2. Comminuted right C1 facet fracture and transverse right C1 lamina fracture, with near anatomic alignment. 3. Extensive multilevel  cervical degenerative changes as above. 4. Continued masslike consolidation within the right upper lobe, incompletely evaluated on this study.  In the ED pt received: Medications  HYDROmorphone (DILAUDID) injection 0.5 mg (0.5 mg Intravenous Given 03/17/23 2253)  lactated ringers infusion (has no administration in time range)  cefTRIAXone (ROCEPHIN) 2 g in sodium chloride 0.9 % 100 mL IVPB (has no administration in time range)  ondansetron (ZOFRAN) injection 4 mg (4 mg Intravenous Given 03/17/23 2109)  hydrALAZINE (APRESOLINE) injection 10 mg (10 mg Intravenous Given 03/17/23 2335)   Review of Systems  Unable to perform ROS: Age   Past Medical History:  Diagnosis Date  . BPH (benign prostatic hyperplasia)    managed by [facility]  . COPD (chronic obstructive pulmonary disease) (HCC)   . Elevated PSA, between 10 and less than 20 ng/ml    [facility] Nash, watchful waiting  . Hypertension   . Osteoporosis    by DEXA   Past Surgical History:  Procedure Laterality Date  . HIP ARTHROPLASTY Right 03/24/2022   Procedure: ARTHROPLASTY BIPOLAR HIP (HEMIARTHROPLASTY);  Surgeon: Reinaldo Berber, MD;  Location: [facility] ORS;  Service: Orthopedics;  Laterality: Right;  . TYMPANOSTOMY TUBE PLACEMENT  2017    reports that he has quit smoking. He has never used smokeless tobacco. He reports that he does not drink alcohol. No history on file for drug use.  Allergies  Allergen Reactions  . Tylenol [Acetaminophen] Rash    Family History  Problem Relation Age of Onset  . Diabetes Mother   . Hypertension Father   . Cancer Brother 105       multiple myeloma    Prior to Admission medications   Medication Sig Start Date End Date Taking? Authorizing Provider  AMBULATORY NON FORMULARY MEDICATION Joint Advantage Gold 2 tablets daily    [provider]  atorvastatin (LIPITOR) 20 MG tablet TAKE 1 TABLET BY MOUTH EVERY OTHER DAY Patient taking differently: Take 20 mg by mouth every other day. qhs  08/23/22   Sherlene Shams, MD  brimonidine (ALPHAGAN) 0.2 % ophthalmic solution 1 drop 3 (three) times daily.    [provider]  calcium carbonate (OS-CAL) 600 MG TABS Take 600 mg by mouth daily with breakfast.     [provider]  dorzolamide-timolol (COSOPT) 22.3-6.8 MG/ML ophthalmic solution TAKE 1 DROP(S) IN RIGHT EYE 2 TIMES A DAY 07/29/17   [provider]  ferrous sulfate 325 (65 FE) MG tablet Take 1 tablet by mouth daily.    [provider]  fluticasone (FLONASE) 50 MCG/ACT nasal spray Place 1 spray into both nostrils daily.    [provider]  fluticasone-salmeterol (WIXELA INHUB) 100-50 MCG/ACT AEPB Inhale 1 puff into the lungs 2 (two) times daily. 11/17/22   Sherlene Shams, MD  furosemide (LASIX) 20 MG tablet Take 1 tablet (20 mg total) by mouth every other day. 12/24/22   Sherlene Shams, MD  latanoprost (XALATAN) 0.005 % ophthalmic solution Place 1 drop into both eyes at bedtime. 03/21/18   [provider]  leuprolide, 6 Month, (ELIGARD) 45 MG injection Inject 45 mg into the skin every 6 (six) months.    [provider]  mirtazapine (REMERON) 30 MG tablet TAKE 1 TABLET BY MOUTH AT BEDTIME. 02/14/23   Sherlene Shams, MD  Vitamin D, Cholecalciferol, 25 MCG (1000 UT) CAPS Take 1 capsule by mouth daily.    [provider]     Vitals:   03/17/23 1920 03/17/23 2100 03/17/23 2326 03/17/23 2345  BP:   (!) 164/104 (!) 145/103  Pulse:  94 93 (!) 104  Resp:  12 12   Temp:      TempSrc:      SpO2:  (!) 86% 92%   Weight: 72.6 kg      Physical Exam Vitals and nursing note reviewed.  Constitutional:      General: He is not in acute distress.    Appearance: He is ill-appearing.  HENT:     Head: Normocephalic.     Right Ear: Hearing normal.     Left Ear: Hearing normal.     Nose: Nose normal. No nasal deformity.     Mouth/Throat:     Lips: Pink.     Tongue: No lesions.  Eyes:     General: Lids are normal.      Extraocular Movements: Extraocular movements intact.  Cardiovascular:     Rate and Rhythm: Normal rate. Rhythm irregular.     Pulses:          Dorsalis pedis pulses are 2+ on the right side and 2+ on the left side.       Posterior tibial pulses are 2+ on the right side and 2+ on the left side.     Heart sounds: Normal heart sounds.  Pulmonary:     Effort: Pulmonary effort is normal.     Breath  sounds: Normal breath sounds.  Abdominal:     General: Bowel sounds are normal. There is no distension.     Palpations: Abdomen is soft. There is no mass.     Tenderness: There is no abdominal tenderness.  Musculoskeletal:     Right lower leg: 2+ Edema present.     Left lower leg: 2+ Edema present.  Skin:    General: Skin is warm.  Neurological:     General: No focal deficit present.     Mental Status: He is oriented to person, place, and time.     Cranial Nerves: Cranial nerves 2-12 are intact.     Comments: Pt is somnolent from his dilaudid.    Psychiatric:        Attention and Perception: He is inattentive.        Speech: Speech normal.        Behavior: Behavior is cooperative.     Labs on Admission: I have personally reviewed following labs and imaging studies Results for orders placed or performed during the hospital encounter of 03/17/23 (from the past 24 hour(s))  Comprehensive metabolic panel     Status: Abnormal   Collection Time: 03/17/23  8:36 PM  Result Value Ref Range   Sodium 144 135 - 145 mmol/L   Potassium 4.1 3.5 - 5.1 mmol/L   Chloride 111 98 - 111 mmol/L   CO2 23 22 - 32 mmol/L   Glucose, Bld 125 (H) 70 - 99 mg/dL   BUN 40 (H) 8 - 23 mg/dL   Creatinine, Ser 4.23 (H) 0.61 - 1.24 mg/dL   Calcium 8.8 (L) 8.9 - 10.3 mg/dL   Total Protein 5.7 (L) 6.5 - 8.1 g/dL   Albumin 3.3 (L) 3.5 - 5.0 g/dL   AST 47 (H) 15 - 41 U/L   ALT 46 (H) 0 - 44 U/L   Alkaline Phosphatase 90 38 - 126 U/L   Total Bilirubin 1.1 <1.2 mg/dL   GFR, Estimated 39 (L) >60 mL/min   Anion gap 10 5  - 15  Troponin I (High Sensitivity)     Status: Abnormal   Collection Time: 03/17/23  8:36 PM  Result Value Ref Range   Troponin I (High Sensitivity) 33 (H) <18 ng/L  CBC with Differential     Status: Abnormal   Collection Time: 03/17/23  8:36 PM  Result Value Ref Range   WBC 11.2 (H) 4.0 - 10.5 K/uL   RBC 3.74 (L) 4.22 - 5.81 MIL/uL   Hemoglobin 12.1 (L) 13.0 - 17.0 g/dL   HCT 53.6 (L) 14.4 - 31.5 %   MCV 100.5 (H) 80.0 - 100.0 fL   MCH 32.4 26.0 - 34.0 pg   MCHC 32.2 30.0 - 36.0 g/dL   RDW 40.0 86.7 - 61.9 %   Platelets 171 150 - 400 K/uL   nRBC 0.0 0.0 - 0.2 %   Neutrophils Relative % 85 %   Neutro Abs 9.6 (H) 1.7 - 7.7 K/uL   Lymphocytes Relative 7 %   Lymphs Abs 0.8 0.7 - 4.0 K/uL   Monocytes Relative 4 %   Monocytes Absolute 0.4 0.1 - 1.0 K/uL   Eosinophils Relative 1 %   Eosinophils Absolute 0.1 0.0 - 0.5 K/uL   Basophils Relative 1 %   Basophils Absolute 0.1 0.0 - 0.1 K/uL   Immature Granulocytes 2 %   Abs Immature Granulocytes 0.23 (H) 0.00 - 0.07 K/uL  Troponin I (High Sensitivity)     Status: Abnormal  Collection Time: 03/17/23 11:02 PM  Result Value Ref Range   Troponin I (High Sensitivity) 42 (H) <18 ng/L   CBC: Recent Labs  Lab 03/17/23 2036  WBC 11.2*  NEUTROABS 9.6*  HGB 12.1*  HCT 37.6*  MCV 100.5*  PLT 171   Basic Metabolic Panel: Recent Labs  Lab 03/17/23 2036  NA 144  K 4.1  CL 111  CO2 23  GLUCOSE 125*  BUN 40*  CREATININE 1.63*  CALCIUM 8.8*   GFR: Estimated Creatinine Clearance: 28.5 mL/min (A) (by C-G formula based on SCr of 1.63 mg/dL (H)). Liver Function Tests: Recent Labs  Lab 03/17/23 2036  AST 47*  ALT 46*  ALKPHOS 90  BILITOT 1.1  PROT 5.7*  ALBUMIN 3.3*   No results for input(s): "LIPASE", "AMYLASE" in the last 168 hours. No results for input(s): "AMMONIA" in the last 168 hours. Coagulation Profile: No results for input(s): "INR", "PROTIME" in the last 168 hours. Cardiac Enzymes: No results for input(s):  "CKTOTAL", "CKMB", "CKMBINDEX", "TROPONINI" in the last 168 hours. BNP (last 3 results) No results for input(s): "PROBNP" in the last 8760 hours. HbA1C: No results for input(s): "HGBA1C" in the last 72 hours. CBG: No results for input(s): "GLUCAP" in the last 168 hours. Lipid Profile: No results for input(s): "CHOL", "HDL", "LDLCALC", "TRIG", "CHOLHDL", "LDLDIRECT" in the last 72 hours. Thyroid Function Tests: No results for input(s): "TSH", "T4TOTAL", "FREET4", "T3FREE", "THYROIDAB" in the last 72 hours. Anemia Panel: No results for input(s): "VITAMINB12", "FOLATE", "FERRITIN", "TIBC", "IRON", "RETICCTPCT" in the last 72 hours. Urinalysis    Component Value Date/Time   COLORURINE YELLOW 12/22/2022 1513   APPEARANCEUR CLEAR 12/22/2022 1513   LABSPEC 1.015 12/22/2022 1513   PHURINE 6.5 12/22/2022 1513   GLUCOSEU NEGATIVE 12/22/2022 1513   HGBUR NEGATIVE 12/22/2022 1513   BILIRUBINUR NEGATIVE 12/22/2022 1513   KETONESUR NEGATIVE 12/22/2022 1513   UROBILINOGEN 0.2 12/22/2022 1513   NITRITE NEGATIVE 12/22/2022 1513   LEUKOCYTESUR TRACE (A) 12/22/2022 1513    Unresulted Labs (From admission, onward)     Start     Ordered   03/17/23 2356  Blood gas, venous  Once,   R        03/17/23 2359   03/17/23 2257  Procalcitonin  Add-on,   AD       References:    Procalcitonin Lower Respiratory Tract Infection AND Sepsis Procalcitonin Algorithm   03/17/23 2256            Medications  HYDROmorphone (DILAUDID) injection 0.5 mg (0.5 mg Intravenous Given 03/17/23 2253)  lactated ringers infusion (has no administration in time range)  cefTRIAXone (ROCEPHIN) 2 g in sodium chloride 0.9 % 100 mL IVPB (has no administration in time range)  ondansetron (ZOFRAN) injection 4 mg (4 mg Intravenous Given 03/17/23 2109)  hydrALAZINE (APRESOLINE) injection 10 mg (10 mg Intravenous Given 03/17/23 2335)    Radiological Exams on Admission: MR Cervical Spine Wo Contrast  Result Date:  03/17/2023 CLINICAL DATA:  Initial evaluation for acute trauma. EXAM: MRI CERVICAL SPINE WITHOUT CONTRAST TECHNIQUE: Multiplanar, multisequence MR imaging of the cervical spine was performed. No intravenous contrast was administered. COMPARISON:  Prior CT from earlier the same day. FINDINGS: Alignment: Straightening of the normal cervical lordosis. Trace retrolisthesis of C3 on C4, with trace anterolisthesis of C4 on C5, C7 on T1, and T1 on T2. Findings likely chronic and degenerative. Vertebrae: Previously identified oblique type 2 odontoid fracture with minimal distraction again noted, stable from prior CT. Concomitant C1  fractures noted as well, also grossly stable, better seen on prior exam. Associated reactive marrow edema, consistent with acute fractures. No significant displacement or malalignment. No other acute or recent fracture. Mild chronic height loss present at the superior endplates of T3 and T4. Underlying bone marrow signal intensity within normal limits. No worrisome osseous lesions. Cord: Normal signal and morphology. No evidence for traumatic cord injury. Posterior Fossa, vertebral arteries, paraspinal tissues: Age-related atrophy noted within the visualized brain. Craniocervical junction within normal limits. Mild edema within the upper prevertebral soft tissues related to the adjacent odontoid fracture. The major ligamentous structures appear intact. Normal flow voids seen within the vertebral arteries bilaterally. Disc levels: C1-2: Degenerative osteoarthritic changes about the atlantodental articulation with underlying C1 and C2 fractures. No stenosis. C2-C3: Minimal disc bulge. Moderate left facet hypertrophy. No significant stenosis. C3-C4: Mild disc bulge with uncovertebral spurring. Mild bilateral facet hypertrophy. No spinal stenosis. Mild to moderate left C4 foraminal narrowing. Right neural foramen remains patent. C4-C5: Mild disc bulge with left-sided uncovertebral spurring. Mild  left-sided facet hypertrophy. No spinal stenosis. Mild left C5 foraminal narrowing. Right neural foramen remains patent. C5-C6: Degenerate intervertebral disc space narrowing with circumferential disc osteophyte complex. No significant spinal stenosis. Moderate right C6 foraminal narrowing. Left neural foramen remains patent. C6-C7: Degenerate intervertebral disc space narrowing with circumferential disc osteophyte complex. No spinal stenosis. Moderate right C7 foraminal narrowing. Left neural foramen remains patent. C7-T1: Anterolisthesis without significant disc bulge. Moderate right facet hypertrophy. No spinal stenosis. Foramina remain IMPRESSION: 1. Acute type 2 odontoid fracture with minimal distraction, with concomitant C1 fractures. Findings are stable and better visualized on prior CT. 2. No other acute traumatic injury within the cervical spine. No evidence for traumatic cord injury. Major ligamentous structures intact. 3. Underlying multilevel cervical spondylosis without significant spinal stenosis. Mild to moderate left C4 and C5 foraminal narrowing, with moderate right C6 and C7 foraminal stenosis. Electronically Signed   By: Rise Mu M.D.   On: 03/17/2023 23:51   DG Hip Unilat With Pelvis 2-3 Views Right  Result Date: 03/17/2023 CLINICAL DATA:  Fall, right hip pain EXAM: DG HIP (WITH OR WITHOUT PELVIS) 2-3V RIGHT COMPARISON:  03/24/2022 FINDINGS: Interval right hip replacement. There is a fracture through the medial wall of the acetabulum with mild protrusio. No subluxation or dislocation. IMPRESSION: Right hip replacement. Fracture through the medial wall of the right acetabulum. Electronically Signed   By: Charlett Nose M.D.   On: 03/17/2023 20:31   CT Cervical Spine Wo Contrast  Result Date: 03/17/2023 CLINICAL DATA:  Larey Seat, head trauma EXAM: CT CERVICAL SPINE WITHOUT CONTRAST TECHNIQUE: Multidetector CT imaging of the cervical spine was performed without intravenous contrast.  Multiplanar CT image reconstructions were also generated. RADIATION DOSE REDUCTION: This exam was performed according to the departmental dose-optimization program which includes automated exposure control, adjustment of the mA and/or kV according to patient size and/or use of iterative reconstruction technique. COMPARISON:  03/23/2022 FINDINGS: Alignment: Stable mild right convex scoliosis. Skull base and vertebrae: There is a minimally distracted type 2 odontoid fracture, with 2 mm of separation at the fracture site. Otherwise alignment is anatomic. There is also a nondisplaced fracture through the right lamina at C1, with fracture extending through the right C1 facet. Mild comminution at the fracture site. Alignment is anatomic. No other acute displaced cervical spine fractures. Soft tissues and spinal canal: No evidence of canal hematoma. No prevertebral soft tissue swelling. Disc levels: There are severe degenerative changes at the C1-C2  interface, right much greater than left. There is diffuse facet hypertrophy and spondylosis, most pronounced at the C5-6 and C6-7 levels. Upper chest: Airway is patent. Continue masslike consolidation within the right upper lobe, incompletely evaluated on this study. Other: Reconstructed images confirm the above findings. IMPRESSION: 1. Type 2 odontoid fracture, with minimal distraction of the fracture site. Otherwise near anatomic alignment. 2. Comminuted right C1 facet fracture and transverse right C1 lamina fracture, with near anatomic alignment. 3. Extensive multilevel cervical degenerative changes as above. 4. Continued masslike consolidation within the right upper lobe, incompletely evaluated on this study. Critical Value/emergent results were called by telephone at the time of interpretation on 03/17/2023 at 8:30 pm to provider [facility] , who verbally acknowledged these results. Electronically Signed   By: Sharlet Salina M.D.   On: 03/17/2023 20:30   CT Head Wo  Contrast  Result Date: 03/17/2023 CLINICAL DATA:  Larey Seat today, head trauma EXAM: CT HEAD WITHOUT CONTRAST TECHNIQUE: Contiguous axial images were obtained from the base of the skull through the vertex without intravenous contrast. RADIATION DOSE REDUCTION: This exam was performed according to the departmental dose-optimization program which includes automated exposure control, adjustment of the mA and/or kV according to patient size and/or use of iterative reconstruction technique. COMPARISON:  03/23/2022 FINDINGS: Brain: Diffuse cerebral atrophy, likely age appropriate. No acute infarct or hemorrhage. Lateral ventricles and midline structures are unremarkable. No acute extra-axial fluid collections. No mass effect. Vascular: No hyperdense vessel or unexpected calcification. Stable atherosclerosis. Skull: Scalp hematoma laceration along the midline parietal convexity. No underlying fracture. The remainder of the calvarium is unremarkable. Sinuses/Orbits: No acute finding. Other: None. IMPRESSION: 1. Scalp hematoma laceration along the midline parietal convexity. No underlying fracture. 2. No acute intracranial process. Electronically Signed   By: Sharlet Salina M.D.   On: 03/17/2023 20:23     Data Reviewed: Relevant notes from primary care and specialist visits, past discharge summaries as available in EHR, including Care Everywhere. Prior diagnostic testing as pertinent to current admission diagnoses Updated medications and problem lists for reconciliation ED course, including vitals, labs, imaging, treatment and response to treatment Triage notes, nursing and pharmacy notes and ED provider's notes Notable results as noted in HPI  Assessment and Plan: No notes have been filed under this hospital service. Service: Hospitalist       Prognosis: ***  DVT prophylaxis:  ***  Consults:  ***  Advance Care Planning:    Code Status: Prior   Family Communication:  ***  Disposition Plan:   ***  Severity of Illness: {Observation/Inpatient:21159}  Author: Gertha Calkin, MD 03/17/2023 11:59 PM  For on call review [email or web address].

## 2023-03-17 NOTE — H&P (Addendum)
History and Physical    Patient: Eric Hicks:[id number] DOB: 1927/03/05 DOA: 03/20/2023 DOS: the patient was seen and examined on 03/18/2023 PCP: Sherlene Shams, MD  Patient coming from: Home  Chief Complaint:  Chief Complaint  Patient presents with   Fall    Pt attempts to get up from chair @ home and falls from standing resulting in crown of scalp laceration and possible rt hip fx rt hip noted to be turned and shortened laceration appears to have stopped bleeding @ this time pt is aox4     HPI: Eric Hicks is a 89 y.o. male with h/o CKD-3a, HTN, HLD, COPD, dCHF, A fib not on AC, GIB, prostate cancer, BPH, anemia, who presents coming for fall while he Tried to get up out of wheelchair and fell forward and NS is planning to see for odontoid fracture.  Pt has also sustained a right acetabular fracture that is nonoperative.  At bedside his son is there and patient is somnolent oriented as he just got Dilaudid is in a c-collar was said to be observed until neurosurgery can see him. Patient also has a small laceration on the scalp and his right hip is short and has pain patient also has had hip surgery in the past in the same hip.  HPI is otherwise limited.  In emergency room vitals trend shows: Tachycardia 104, hypoxia with O2 sats at 86%, lactic is ordered and pending.  Suspect sepsis. Vitals:   03/10/2023 2100 02/25/2023 2326 03/20/2023 2345 03/18/23 0000  BP:  (!) 164/104 (!) 145/103 (!) 170/103  Pulse: 94 93 (!) 104 (!) 52  Temp:      Resp: 12 12    Weight:      SpO2: (!) 86% 92%    TempSrc:      EKG shows a.fib 92 no st elevation.  Troponin 33.  Labs are notable for metabolic panel shows CKD stage IIIb-4 with creatinine of 1.63, EGFR of 39 glucose 125 albumin 3.3 AST and ALT both elevated at 47 and 46 CBC shows leukocytosis of 11.2 hemoglobin of 12.1 MCV of 100.5. Patient had CT head scalp, laceration noted no acute intracranial process otherwise:  1. Type 2 odontoid fracture,  with minimal distraction of the fracture site. Otherwise near anatomic alignment. 2. Comminuted right C1 facet fracture and transverse right C1 lamina fracture, with near anatomic alignment. 3. Extensive multilevel cervical degenerative changes as above. 4. Continued masslike consolidation within the right upper lobe, incompletely evaluated on this study.  In the ED pt received: Medications  HYDROmorphone (DILAUDID) injection 0.5 mg (0.5 mg Intravenous Given 02/25/2023 2253)  cefTRIAXone (ROCEPHIN) 2 g in sodium chloride 0.9 % 100 mL IVPB (2 g Intravenous New Bag/Given 03/18/23 0022)  atorvastatin (LIPITOR) tablet 20 mg (has no administration in time range)  fluticasone furoate-vilanterol (BREO ELLIPTA) 100-25 MCG/ACT 1 puff (has no administration in time range)  brimonidine (ALPHAGAN) 0.2 % ophthalmic solution 1 drop (has no administration in time range)  dextrose 5 %-0.9 % sodium chloride infusion (has no administration in time range)  sodium chloride flush (NS) 0.9 % injection 3 mL (has no administration in time range)  nicotine (NICODERM CQ - dosed in mg/24 hours) patch 14 mg (has no administration in time range)  hydrALAZINE (APRESOLINE) injection 5 mg (has no administration in time range)  insulin aspart (novoLOG) injection 0-9 Units (has no administration in time range)  ondansetron (ZOFRAN) injection 4 mg (4 mg Intravenous Given 03/13/2023 2109)  hydrALAZINE (  APRESOLINE) injection 10 mg (10 mg Intravenous Given 02/25/2023 2335)   Review of Systems  Unable to perform ROS: Age  Musculoskeletal:  Positive for falls.   Past Medical History:  Diagnosis Date   BPH (benign prostatic hyperplasia)    managed by [facility]   COPD (chronic obstructive pulmonary disease) (HCC)    Elevated PSA, between 10 and less than 20 ng/ml    [facility]ner Nash, watchful waiting   Hypertension    Osteoporosis    by DEXA   Past Surgical History:  Procedure Laterality Date   HIP ARTHROPLASTY Right 03/24/2022    Procedure: ARTHROPLASTY BIPOLAR HIP (HEMIARTHROPLASTY);  Surgeon: Reinaldo Berber, MD;  Location: [facility] ORS;  Service: Orthopedics;  Laterality: Right;   TYMPANOSTOMY TUBE PLACEMENT  2017    reports that he has quit smoking. He has never used smokeless tobacco. He reports that he does not drink alcohol. No history on file for drug use.  Allergies  Allergen Reactions   Tylenol [Acetaminophen] Rash   Family History  Problem Relation Age of Onset   Diabetes Mother    Hypertension Father    Cancer Brother 91       multiple myeloma    Prior to Admission medications   Medication Sig Start Date End Date Taking? Authorizing Provider  AMBULATORY NON FORMULARY MEDICATION Joint Advantage Gold 2 tablets daily    [provider]  atorvastatin (LIPITOR) 20 MG tablet TAKE 1 TABLET BY MOUTH EVERY OTHER DAY Patient taking differently: Take 20 mg by mouth every other day. qhs 08/23/22   Sherlene Shams, MD  brimonidine (ALPHAGAN) 0.2 % ophthalmic solution 1 drop 3 (three) times daily.    [provider]  calcium carbonate (OS-CAL) 600 MG TABS Take 600 mg by mouth daily with breakfast.     [provider]  dorzolamide-timolol (COSOPT) 22.3-6.8 MG/ML ophthalmic solution TAKE 1 DROP(S) IN RIGHT EYE 2 TIMES A DAY 07/29/17   [provider]  ferrous sulfate 325 (65 FE) MG tablet Take 1 tablet by mouth daily.    [provider]  fluticasone (FLONASE) 50 MCG/ACT nasal spray Place 1 spray into both nostrils daily.    [provider]  fluticasone-salmeterol (WIXELA INHUB) 100-50 MCG/ACT AEPB Inhale 1 puff into the lungs 2 (two) times daily. 11/17/22   Sherlene Shams, MD  furosemide (LASIX) 20 MG tablet Take 1 tablet (20 mg total) by mouth every other day. 12/24/22   Sherlene Shams, MD  latanoprost (XALATAN) 0.005 % ophthalmic solution Place 1 drop into both eyes at bedtime. 03/21/18   [provider]  leuprolide, 6 Month, (ELIGARD) 45 MG injection  Inject 45 mg into the skin every 6 (six) months.    [provider]  mirtazapine (REMERON) 30 MG tablet TAKE 1 TABLET BY MOUTH AT BEDTIME. 02/14/23   Sherlene Shams, MD  Vitamin D, Cholecalciferol, 25 MCG (1000 UT) CAPS Take 1 capsule by mouth daily.    [provider]     Vitals:   03/02/2023 2100 02/27/2023 2326 03/19/2023 2345 03/18/23 0000  BP:  (!) 164/104 (!) 145/103 (!) 170/103  Pulse: 94 93 (!) 104 (!) 52  Resp: 12 12    Temp:      TempSrc:      SpO2: (!) 86% 92%    Weight:       Physical Exam Vitals and nursing note reviewed.  Constitutional:      General: He is not in acute distress.    Appearance:  He is ill-appearing.  HENT:     Head: Normocephalic.     Right Ear: Hearing normal.     Left Ear: Hearing normal.     Nose: Nose normal. No nasal deformity.     Mouth/Throat:     Lips: Pink.     Tongue: No lesions.  Eyes:     General: Lids are normal.     Extraocular Movements: Extraocular movements intact.  Cardiovascular:     Rate and Rhythm: Normal rate. Rhythm irregular.     Pulses:          Dorsalis pedis pulses are 2+ on the right side and 2+ on the left side.       Posterior tibial pulses are 2+ on the right side and 2+ on the left side.     Heart sounds: Normal heart sounds.  Pulmonary:     Effort: Pulmonary effort is normal.     Breath sounds: Rales present.  Abdominal:     General: Bowel sounds are normal. There is no distension.     Palpations: Abdomen is soft. There is no mass.     Tenderness: There is no abdominal tenderness.  Musculoskeletal:     Right lower leg: 2+ Edema present.     Left lower leg: 2+ Edema present.  Skin:    General: Skin is warm.  Neurological:     General: No focal deficit present.     Mental Status: He is oriented to person, place, and time.     Cranial Nerves: Cranial nerves 2-12 are intact.     Comments: Pt is somnolent from his dilaudid.    Psychiatric:        Attention and Perception: He is inattentive.         Speech: Speech normal.        Behavior: Behavior is cooperative.     Labs on Admission: I have personally reviewed following labs and imaging studies Results for orders placed or performed during the hospital encounter of 03/02/2023 (from the past 24 hour(s))  Comprehensive metabolic panel     Status: Abnormal   Collection Time: 03/23/2023  8:36 PM  Result Value Ref Range   Sodium 144 135 - 145 mmol/L   Potassium 4.1 3.5 - 5.1 mmol/L   Chloride 111 98 - 111 mmol/L   CO2 23 22 - 32 mmol/L   Glucose, Bld 125 (H) 70 - 99 mg/dL   BUN 40 (H) 8 - 23 mg/dL   Creatinine, Ser 6.96 (H) 0.61 - 1.24 mg/dL   Calcium 8.8 (L) 8.9 - 10.3 mg/dL   Total Protein 5.7 (L) 6.5 - 8.1 g/dL   Albumin 3.3 (L) 3.5 - 5.0 g/dL   AST 47 (H) 15 - 41 U/L   ALT 46 (H) 0 - 44 U/L   Alkaline Phosphatase 90 38 - 126 U/L   Total Bilirubin 1.1 <1.2 mg/dL   GFR, Estimated 39 (L) >60 mL/min   Anion gap 10 5 - 15  Troponin I (High Sensitivity)     Status: Abnormal   Collection Time: 03/07/2023  8:36 PM  Result Value Ref Range   Troponin I (High Sensitivity) 33 (H) <18 ng/L  CBC with Differential     Status: Abnormal   Collection Time: 03/19/2023  8:36 PM  Result Value Ref Range   WBC 11.2 (H) 4.0 - 10.5 K/uL   RBC 3.74 (L) 4.22 - 5.81 MIL/uL   Hemoglobin 12.1 (L) 13.0 - 17.0 g/dL  HCT 37.6 (L) 39.0 - 52.0 %   MCV 100.5 (H) 80.0 - 100.0 fL   MCH 32.4 26.0 - 34.0 pg   MCHC 32.2 30.0 - 36.0 g/dL   RDW 08.6 57.8 - 46.9 %   Platelets 171 150 - 400 K/uL   nRBC 0.0 0.0 - 0.2 %   Neutrophils Relative % 85 %   Neutro Abs 9.6 (H) 1.7 - 7.7 K/uL   Lymphocytes Relative 7 %   Lymphs Abs 0.8 0.7 - 4.0 K/uL   Monocytes Relative 4 %   Monocytes Absolute 0.4 0.1 - 1.0 K/uL   Eosinophils Relative 1 %   Eosinophils Absolute 0.1 0.0 - 0.5 K/uL   Basophils Relative 1 %   Basophils Absolute 0.1 0.0 - 0.1 K/uL   Immature Granulocytes 2 %   Abs Immature Granulocytes 0.23 (H) 0.00 - 0.07 K/uL  Troponin I (High Sensitivity)      Status: Abnormal   Collection Time: 03/02/2023 11:02 PM  Result Value Ref Range   Troponin I (High Sensitivity) 42 (H) <18 ng/L   CBC: Recent Labs  Lab 03/26/2023 2036  WBC 11.2*  NEUTROABS 9.6*  HGB 12.1*  HCT 37.6*  MCV 100.5*  PLT 171   Basic Metabolic Panel: Recent Labs  Lab 03/12/2023 2036  NA 144  K 4.1  CL 111  CO2 23  GLUCOSE 125*  BUN 40*  CREATININE 1.63*  CALCIUM 8.8*   GFR: Estimated Creatinine Clearance: 28.5 mL/min (A) (by C-G formula based on SCr of 1.63 mg/dL (H)). Liver Function Tests: Recent Labs  Lab 03/20/2023 2036  AST 47*  ALT 46*  ALKPHOS 90  BILITOT 1.1  PROT 5.7*  ALBUMIN 3.3*   No results for input(s): "LIPASE", "AMYLASE" in the last 168 hours. No results for input(s): "AMMONIA" in the last 168 hours. Coagulation Profile: No results for input(s): "INR", "PROTIME" in the last 168 hours. Cardiac Enzymes: No results for input(s): "CKTOTAL", "CKMB", "CKMBINDEX", "TROPONINI" in the last 168 hours. BNP (last 3 results) No results for input(s): "PROBNP" in the last 8760 hours. HbA1C: No results for input(s): "HGBA1C" in the last 72 hours. CBG: No results for input(s): "GLUCAP" in the last 168 hours. Lipid Profile: No results for input(s): "CHOL", "HDL", "LDLCALC", "TRIG", "CHOLHDL", "LDLDIRECT" in the last 72 hours. Thyroid Function Tests: No results for input(s): "TSH", "T4TOTAL", "FREET4", "T3FREE", "THYROIDAB" in the last 72 hours. Anemia Panel: No results for input(s): "VITAMINB12", "FOLATE", "FERRITIN", "TIBC", "IRON", "RETICCTPCT" in the last 72 hours. Urinalysis    Component Value Date/Time   COLORURINE YELLOW 12/22/2022 1513   APPEARANCEUR CLEAR 12/22/2022 1513   LABSPEC 1.015 12/22/2022 1513   PHURINE 6.5 12/22/2022 1513   GLUCOSEU NEGATIVE 12/22/2022 1513   HGBUR NEGATIVE 12/22/2022 1513   BILIRUBINUR NEGATIVE 12/22/2022 1513   KETONESUR NEGATIVE 12/22/2022 1513   UROBILINOGEN 0.2 12/22/2022 1513   NITRITE NEGATIVE  12/22/2022 1513   LEUKOCYTESUR TRACE (A) 12/22/2022 1513    Unresulted Labs (From admission, onward)     Start     Ordered   03/18/23 0007  Culture, blood (Routine X 2) w Reflex to ID Panel  BLOOD CULTURE X 2,   STAT      03/18/23 0006   03/18/23 0006  Lactic acid, plasma  (Lactic Acid)  STAT Now then every 3 hours,   STAT      03/18/23 0005   03/22/2023 2356  Blood gas, venous  Once,   R  03/24/2023 2359   03/13/2023 2257  Procalcitonin  Add-on,   AD       References:    Procalcitonin Lower Respiratory Tract Infection AND Sepsis Procalcitonin Algorithm   03/02/2023 2256            Medications  HYDROmorphone (DILAUDID) injection 0.5 mg (0.5 mg Intravenous Given 03/18/2023 2253)  cefTRIAXone (ROCEPHIN) 2 g in sodium chloride 0.9 % 100 mL IVPB (2 g Intravenous New Bag/Given 03/18/23 0022)  atorvastatin (LIPITOR) tablet 20 mg (has no administration in time range)  fluticasone furoate-vilanterol (BREO ELLIPTA) 100-25 MCG/ACT 1 puff (has no administration in time range)  brimonidine (ALPHAGAN) 0.2 % ophthalmic solution 1 drop (has no administration in time range)  dextrose 5 %-0.9 % sodium chloride infusion (has no administration in time range)  sodium chloride flush (NS) 0.9 % injection 3 mL (has no administration in time range)  nicotine (NICODERM CQ - dosed in mg/24 hours) patch 14 mg (has no administration in time range)  hydrALAZINE (APRESOLINE) injection 5 mg (has no administration in time range)  insulin aspart (novoLOG) injection 0-9 Units (has no administration in time range)  ondansetron (ZOFRAN) injection 4 mg (4 mg Intravenous Given 03/08/2023 2109)  hydrALAZINE (APRESOLINE) injection 10 mg (10 mg Intravenous Given 03/14/2023 2335)    Radiological Exams on Admission: MR Cervical Spine Wo Contrast  Result Date: 03/25/2023 CLINICAL DATA:  Initial evaluation for acute trauma. EXAM: MRI CERVICAL SPINE WITHOUT CONTRAST TECHNIQUE: Multiplanar, multisequence MR imaging of the  cervical spine was performed. No intravenous contrast was administered. COMPARISON:  Prior CT from earlier the same day. FINDINGS: Alignment: Straightening of the normal cervical lordosis. Trace retrolisthesis of C3 on C4, with trace anterolisthesis of C4 on C5, C7 on T1, and T1 on T2. Findings likely chronic and degenerative. Vertebrae: Previously identified oblique type 2 odontoid fracture with minimal distraction again noted, stable from prior CT. Concomitant C1 fractures noted as well, also grossly stable, better seen on prior exam. Associated reactive marrow edema, consistent with acute fractures. No significant displacement or malalignment. No other acute or recent fracture. Mild chronic height loss present at the superior endplates of T3 and T4. Underlying bone marrow signal intensity within normal limits. No worrisome osseous lesions. Cord: Normal signal and morphology. No evidence for traumatic cord injury. Posterior Fossa, vertebral arteries, paraspinal tissues: Age-related atrophy noted within the visualized brain. Craniocervical junction within normal limits. Mild edema within the upper prevertebral soft tissues related to the adjacent odontoid fracture. The major ligamentous structures appear intact. Normal flow voids seen within the vertebral arteries bilaterally. Disc levels: C1-2: Degenerative osteoarthritic changes about the atlantodental articulation with underlying C1 and C2 fractures. No stenosis. C2-C3: Minimal disc bulge. Moderate left facet hypertrophy. No significant stenosis. C3-C4: Mild disc bulge with uncovertebral spurring. Mild bilateral facet hypertrophy. No spinal stenosis. Mild to moderate left C4 foraminal narrowing. Right neural foramen remains patent. C4-C5: Mild disc bulge with left-sided uncovertebral spurring. Mild left-sided facet hypertrophy. No spinal stenosis. Mild left C5 foraminal narrowing. Right neural foramen remains patent. C5-C6: Degenerate intervertebral disc space  narrowing with circumferential disc osteophyte complex. No significant spinal stenosis. Moderate right C6 foraminal narrowing. Left neural foramen remains patent. C6-C7: Degenerate intervertebral disc space narrowing with circumferential disc osteophyte complex. No spinal stenosis. Moderate right C7 foraminal narrowing. Left neural foramen remains patent. C7-T1: Anterolisthesis without significant disc bulge. Moderate right facet hypertrophy. No spinal stenosis. Foramina remain IMPRESSION: 1. Acute type 2 odontoid fracture with minimal distraction, with concomitant C1 fractures.  Findings are stable and better visualized on prior CT. 2. No other acute traumatic injury within the cervical spine. No evidence for traumatic cord injury. Major ligamentous structures intact. 3. Underlying multilevel cervical spondylosis without significant spinal stenosis. Mild to moderate left C4 and C5 foraminal narrowing, with moderate right C6 and C7 foraminal stenosis. Electronically Signed   By: Rise Mu M.D.   On: 03/01/2023 23:51   DG Hip Unilat With Pelvis 2-3 Views Right  Result Date: 03/25/2023 CLINICAL DATA:  Fall, right hip pain EXAM: DG HIP (WITH OR WITHOUT PELVIS) 2-3V RIGHT COMPARISON:  03/24/2022 FINDINGS: Interval right hip replacement. There is a fracture through the medial wall of the acetabulum with mild protrusio. No subluxation or dislocation. IMPRESSION: Right hip replacement. Fracture through the medial wall of the right acetabulum. Electronically Signed   By: Charlett Nose M.D.   On: 03/06/2023 20:31   CT Cervical Spine Wo Contrast  Result Date: 03/12/2023 CLINICAL DATA:  Larey Seat, head trauma EXAM: CT CERVICAL SPINE WITHOUT CONTRAST TECHNIQUE: Multidetector CT imaging of the cervical spine was performed without intravenous contrast. Multiplanar CT image reconstructions were also generated. RADIATION DOSE REDUCTION: This exam was performed according to the departmental dose-optimization program  which includes automated exposure control, adjustment of the mA and/or kV according to patient size and/or use of iterative reconstruction technique. COMPARISON:  03/23/2022 FINDINGS: Alignment: Stable mild right convex scoliosis. Skull base and vertebrae: There is a minimally distracted type 2 odontoid fracture, with 2 mm of separation at the fracture site. Otherwise alignment is anatomic. There is also a nondisplaced fracture through the right lamina at C1, with fracture extending through the right C1 facet. Mild comminution at the fracture site. Alignment is anatomic. No other acute displaced cervical spine fractures. Soft tissues and spinal canal: No evidence of canal hematoma. No prevertebral soft tissue swelling. Disc levels: There are severe degenerative changes at the C1-C2 interface, right much greater than left. There is diffuse facet hypertrophy and spondylosis, most pronounced at the C5-6 and C6-7 levels. Upper chest: Airway is patent. Continue masslike consolidation within the right upper lobe, incompletely evaluated on this study. Other: Reconstructed images confirm the above findings. IMPRESSION: 1. Type 2 odontoid fracture, with minimal distraction of the fracture site. Otherwise near anatomic alignment. 2. Comminuted right C1 facet fracture and transverse right C1 lamina fracture, with near anatomic alignment. 3. Extensive multilevel cervical degenerative changes as above. 4. Continued masslike consolidation within the right upper lobe, incompletely evaluated on this study. Critical Value/emergent results were called by telephone at the time of interpretation on 02/27/2023 at 8:30 pm to provider [facility] who verbally acknowledged these results. Electronically Signed   By: Sharlet Salina M.D.   On: 03/19/2023 20:30   CT Head Wo Contrast  Result Date: 03/22/2023 CLINICAL DATA:  Larey Seat today, head trauma EXAM: CT HEAD WITHOUT CONTRAST TECHNIQUE: Contiguous axial images were obtained from the  base of the skull through the vertex without intravenous contrast. RADIATION DOSE REDUCTION: This exam was performed according to the departmental dose-optimization program which includes automated exposure control, adjustment of the mA and/or kV according to patient size and/or use of iterative reconstruction technique. COMPARISON:  03/23/2022 FINDINGS: Brain: Diffuse cerebral atrophy, likely age appropriate. No acute infarct or hemorrhage. Lateral ventricles and midline structures are unremarkable. No acute extra-axial fluid collections. No mass effect. Vascular: No hyperdense vessel or unexpected calcification. Stable atherosclerosis. Skull: Scalp hematoma laceration along the midline parietal convexity. No underlying fracture. The remainder of the  calvarium is unremarkable. Sinuses/Orbits: No acute finding. Other: None. IMPRESSION: 1. Scalp hematoma laceration along the midline parietal convexity. No underlying fracture. 2. No acute intracranial process. Electronically Signed   By: Sharlet Salina M.D.   On: 03/25/2023 20:23     Data Reviewed: Relevant notes from primary care and specialist visits, past discharge summaries as available in EHR, including Care Everywhere. Prior diagnostic testing as pertinent to current admission diagnoses Updated medications and problem lists for reconciliation ED course, including vitals, labs, imaging, treatment and response to treatment Triage notes, nursing and pharmacy notes and ED provider's notes Notable results as noted in HPI  Assessment and Plan: History of pulmonary embolism Will give patient heparin 5000 tonight subcu single dose. A.m. team to reorder once patient has been seen by neurosurgery. I have also ordered venous Dopplers, CT chest noncontrast.   At bedside patient has been hypoxic intermittently is on 2 L initially I thought this was due to the pain medicine that after chart review patient does have a history of PE and order a VQ scan as  well. Patient also has need for anticoagulation with a history of atrial fibrillation however unclear as to why patient is not already on it may have to be discussed with family member for goals of care discussion and further management.    Closed right hip fracture (HCC) Right hip replacement. Fracture through the medial wall of the right acetabulum. Evaluate with Mri once cervical spine management plan is decided per NS.  I will proceed with xray for now. PRN pain medication.    CKD (chronic kidney disease) stage 4, GFR 15-29 ml/min (HCC) Lab Results  Component Value Date   CREATININE 1.63 (H) 03/08/2023   CREATININE 1.70 (H) 01/21/2023   CREATININE 1.87 (H) 12/22/2022  Stable, renally dose medications, avoid contrast studies.   COPD (chronic obstructive pulmonary disease) with emphysema (HCC) Prn albuterol.  Cont with advair.   Chronic diastolic CHF (congestive heart failure) (HCC) Stable, last echo was last year ;   1. Left ventricular ejection fraction, by estimation, is 55 to 60% . The left ventricle has normal function. The left ventricle has no regional wall motion abnormalities. There is mild concentric left ventricular hypertrophy. Left ventricular diastolic parameters are consistent with Grade I diastolic dysfunction ( impaired relaxation) . 2. Right ventricular systolic function is normal. The right ventricular size is normal. 3. Left atrial size was mildly dilated. 4. Right atrial size was mildly dilated. 5. The mitral valve is grossly normal. Mild mitral valve regurgitation. 6. The aortic valve is calcified. Aortic valve regurgitation is mild to moderate. Strict I's and O's, daily weights. Waiting for CT chest noncontrast to evaluate if patient's hypoxia is due to possibly any pleural effusion and also to get better delineation of upper lobe mass which is documented on the CT scan.   HTN (hypertension) Home regimen once med rec is available. As needed hydralazine as  tp is receiving meds for pain control to avoid hypotension.  Vitals:   03/07/2023 1919 03/04/2023 2326 03/14/2023 2345  BP: (!) 167/104 (!) 164/104 (!) 145/103     Paroxysmal atrial fibrillation (HCC) Will cont pt on heparin single dose no anticoagulation.  CHA2DS2/VAS Stroke Risk Points  Current as of 6 minutes ago     7 >= 2 Points: High Risk  1 to 1.99 Points: Medium Risk  0 Points: Low Risk    Last Change:       Details    This score  determines the patient's risk of having a stroke if the  patient has atrial fibrillation.       Points Metrics  1 Has Congestive Heart Failure:  Yes    Current as of 6 minutes ago  0 Has Vascular Disease:  No    Current as of 6 minutes ago  1 Has Hypertension:  Yes    Current as of 6 minutes ago  2 Age:  46    Current as of 6 minutes ago  1 Has Diabetes:  Yes    Current as of 6 minutes ago  2 Had Stroke:  No  Had TIA:  No  Had Thromboembolism:  Yes    Current as of 6 minutes ago  0 Male:  No    Current as of 6 minutes ago        Recurrent falls Attribute to multifactorial etiology possibly varying from blood pressure and also ambulatory dysfunction and falls from aging and deconditioning.  Physical therapy evaluation prior to discharge and possible placement.  Controlled type 2 diabetes mellitus with stage 3 chronic kidney disease, without long-term current use of insulin (HCC) Pt is not on diabetic meds and we will check glycemic protocol.  Diet one pt has swallow.  Until then d5ns at 42 .    Lung cancer, upper lobe (HCC) Unclear if this is Malignancy or aspiration pneumonia.  We will get procalcitonin and start rocephin.  Follow culture and ct chest .     Prognosis: Fair   DVT prophylaxis:  SCD's  Consults:  Neurosurgery.   Advance Care Planning:    Code Status: Limited: Do not attempt resuscitation (DNR) -DNR-LIMITED -Do Not Intubate/DNI    Family Communication:  Son   Disposition Plan:  TBD   Severity of  Illness: The appropriate patient status for this patient is INPATIENT. Inpatient status is judged to be reasonable and necessary in order to provide the required intensity of service to ensure the patient's safety. The patient's presenting symptoms, physical exam findings, and initial radiographic and laboratory data in the context of their chronic comorbidities is felt to place them at high risk for further clinical deterioration. Furthermore, it is not anticipated that the patient will be medically stable for discharge from the hospital within 2 midnights of admission.   * I certify that at the point of admission it is my clinical judgment that the patient will require inpatient hospital care spanning beyond 2 midnights from the point of admission due to high intensity of service, high risk for further deterioration and high frequency of surveillance required.*  Author: Gertha Calkin, MD 03/18/2023 12:42 AM  For on call review [email or web address].  Orders Placed This Encounter  Procedures   Culture, blood (Routine X 2) w Reflex to ID Panel    Standing Status:   Standing    Number of Occurrences:   2   CT Head Wo Contrast    Standing Status:   Standing    Number of Occurrences:   1   CT Cervical Spine Wo Contrast    Standing Status:   Standing    Number of Occurrences:   1   DG Hip Unilat With Pelvis 2-3 Views Right    Standing Status:   Standing    Number of Occurrences:   1    Order Specific Question:   Reason for Exam (SYMPTOM  OR DIAGNOSIS REQUIRED)    Answer:   fall w/ pain and shortening   MR Cervical Spine  Wo Contrast    Standing Status:   Standing    Number of Occurrences:   1    Order Specific Question:   What is the patient's sedation requirement?    Answer:   No Sedation    Order Specific Question:   Does the patient have a pacemaker or implanted devices?    Answer:   No   CT CHEST WO CONTRAST    Standing Status:   Standing    Number of Occurrences:   1   US Venous Img Lower  Bilateral (DVT)    Standing Status:   Standing    Number of Occurrences:   1    Order Specific Question:   Symptom/Reason for Exam    Answer:   Edema leg [[id number]]   Comprehensive metabolic panel    Standing Status:   Standing    Number of Occurrences:   1   CBC with Differential    Standing Status:   Standing    Number of Occurrences:   1   Procalcitonin    Standing Status:   Standing    Number of Occurrences:   1   Blood gas, venous    Standing Status:   Standing    Number of Occurrences:   1   Lactic acid, plasma    Standing Status:   Standing    Number of Occurrences:   2   Diet Carb Modified Fluid consistency: Thin; Room service appropriate? Yes    Standing Status:   Standing    Number of Occurrences:   1    Order Specific Question:   Diet-HS Snack?    Answer:   Nothing    Order Specific Question:   Calorie Level    Answer:   Medium 1600-2000    Order Specific Question:   Fluid consistency:    Answer:   Thin    Order Specific Question:   Room service appropriate?    Answer:   Yes   Cardiac Monitoring Continuous x 24 hours Indications for use: Sub-acute heart failure    Standing Status:   Standing    Number of Occurrences:   1    Order Specific Question:   Indications for use:    Answer:   Sub-acute heart failure   Maintain IV access    Standing Status:   Standing    Number of Occurrences:   1   Vital signs    Standing Status:   Standing    Number of Occurrences:   1   Notify physician (specify)    Standing Status:   Standing    Number of Occurrences:   20    Order Specific Question:   Notify Physician    Answer:   for pulse less than 55 or greater than 120    Order Specific Question:   Notify Physician    Answer:   for respiratory rate less than 12 or greater than 25    Order Specific Question:   Notify Physician    Answer:   for temperature greater than 100.5 F    Order Specific Question:   Notify Physician    Answer:   for urinary output less than 30 mL/hr  for four hours    Order Specific Question:   Notify Physician    Answer:   for systolic BP less than 90 or greater than 160, diastolic BP less than 60 or greater than 100    Order Specific Question:  Notify Physician    Answer:   for new hypoxia w/ oxygen saturations < 88%   Mobility Protocol: No Restrictions    RN to initiate protocols based on patient's level of care    Standing Status:   Standing    Number of Occurrences:   1   Refer to Sidebar Report Refer to ICU, Med-Surg, Progressive, and Step-Down Mobility Protocol Sidebars    Refer to ICU, Med-Surg, Progressive, and Step-Down Mobility Protocol Sidebars    Standing Status:   Standing    Number of Occurrences:   1   If patient diabetic or glucose greater than 140 notify physician for Sliding Scale Insulin Orders    Standing Status:   Standing    Number of Occurrences:   20   Do not place and if present remove PureWick    Standing Status:   Standing    Number of Occurrences:   1   Initiate Oral Care Protocol    Standing Status:   Standing    Number of Occurrences:   1   Initiate Carrier Fluid Protocol    Standing Status:   Standing    Number of Occurrences:   1   RN may order General Admission PRN Orders utilizing "General Admission PRN medications" (through manage orders) for the following patient needs: allergy symptoms (Claritin), cold sores (Carmex), cough (Robitussin DM), eye irritation (Liquifilm Tears), hemorrhoids (Tucks), indigestion (Maalox), minor skin irritation (Hydrocortisone Cream), muscle pain (Ben Gay), nose irritation (saline nasal spray) and sore throat (Chloraseptic spray).    Standing Status:   Standing    Number of Occurrences:   [phone]   Notify physician (specify)    Standing Status:   Standing    Number of Occurrences:   20    Order Specific Question:   Notify Physician    Answer:   for pulse less than 60 and patient is symptomatic    Order Specific Question:   Notify Physician    Answer:   urine output  less than 250 ml 4 hours after first diuretic dose OR less than 1000 ml 24 hours after admission    Order Specific Question:   Notify Physician    Answer:   SBP less than 85    Order Specific Question:   Notify Physician    Answer:   for Glycemic Control (SSI) Order Set if diabetic or glucose >140 mg/dl    Order Specific Question:   Notify Physician    Answer:   for O2 requirements exceeding 6 L/min   Initiate Heart Failure Care Plan    Standing Status:   Standing    Number of Occurrences:   1   Daily weights    Teach patient/caregiver to weigh daily and record on weight chart    Standing Status:   Standing    Number of Occurrences:   1   Strict intake and output    Standing Status:   Standing    Number of Occurrences:   1   In and Out Cath    If patient is unable to collect urine after other methods have failed (i.e., frequent toileting, condom caths), may perform In and Out cath. Follow [facility] Guidelines    Standing Status:   Standing    Number of Occurrences:   20   Patient Education:    Review HF Booklet and HF video with patient and caregivers, and document completion. If patient already has HF booklet, give HF Care  Note and weight chart only.    Standing Status:   Standing    Number of Occurrences:   1   Apply Heart Failure Care Plan    Standing Status:   Standing    Number of Occurrences:   1   Notify physician (specify)    Standing Status:   Standing    Number of Occurrences:   1    Order Specific Question:   Notify Physician    Answer:   HR<60 or >130    Order Specific Question:   Notify Physician    Answer:   SBP <90    Order Specific Question:   Notify Physician    Answer:   Pauses >2 sec    Order Specific Question:   Notify Physician    Answer:   new arrhythmia    Order Specific Question:   Notify Physician    Answer:   return to SR    Order Specific Question:   Notify Physician    Answer:   signs & symptoms of MI   Target HR: 65-105 bpm  with return to baseline rhythm and hemodynamic stability    with return to baseline rhythm and hemodynamic stability    Standing Status:   Standing    Number of Occurrences:   1    Order Specific Question:   Target HR    Answer:   65-105 bpm   Apply Atrial Arrhythmia Care Plan    Standing Status:   Standing    Number of Occurrences:   1   Strict intake and output    Standing Status:   Standing    Number of Occurrences:   1   Swallow screen    Standing Status:   Standing    Number of Occurrences:   1   SCDs    Standing Status:   Standing    Number of Occurrences:   1    Order Specific Question:   Laterality    Answer:   Bilateral   Apply Diabetes Mellitus Care Plan    Standing Status:   Standing    Number of Occurrences:   1   STAT CBG when hypoglycemia is suspected. If treated, recheck every 15 minutes after each treatment until CBG >/= 70 mg/dl    Standing Status:   Standing    Number of Occurrences:   1   No HS correction Insulin    Standing Status:   Standing    Number of Occurrences:   1   Do not attempt resuscitation (DNR)- Limited -Do Not Intubate (DNI)    Standing Status:   Standing    Number of Occurrences:   1    Order Specific Question:   If pulseless and not breathing    Answer:   No CPR or chest compressions.    Order Specific Question:   In Pre-Arrest Conditions (Patient Is Breathing and Has A Pulse)    Answer:   Do not intubate. Provide all appropriate non-invasive medical interventions. Avoid ICU transfer unless indicated or required.    Order Specific Question:   Consent:    Answer:   Discussion documented in EHR or advanced directives reviewed   Consult to hospitalist    Standing Status:   Standing    Number of Occurrences:   1    Order Specific Question:   Place call to:    Answer:   [phone]    Order Specific Question:   Reason for Consult  Answer:   Admit   Consult to neurosurgery Consult Timeframe: ROUTINE - requires response within 24 hours; Reason  for Consult? fall and odontoid fracture Type 2 odontoid fracture, with minimal distraction of the fracture site. Otherwise near anatomic alignment. 2. C...    Type 2 odontoid fracture, with minimal distraction of the fracture site. Otherwise near anatomic alignment. 2. Comminuted right C1 facet fracture and transverse right C1 lamina fracture, with near anatomic alignment. 3. Extensive multilevel cervical degenerative changes as above.    Standing Status:   Standing    Number of Occurrences:   1    Order Specific Question:   Consult Timeframe    Answer:   ROUTINE - requires response within 24 hours    Order Specific Question:   Reason for Consult?    Answer:   fall and odontoid fracture   Nutritional services consult    Standing Status:   Standing    Number of Occurrences:   1    Order Specific Question:   Reason for Consult?    Answer:   nutritional goals   OT eval and treat    Standing Status:   Standing    Number of Occurrences:   1   PT eval and treat    Standing Status:   Standing    Number of Occurrences:   1   Pulse oximetry check with vital signs    Standing Status:   Standing    Number of Occurrences:   1   Oxygen therapy Mode or (Route): Nasal cannula; Liters Per Minute: 2; Keep O2 saturation between: greater than 92 %    Standing Status:   Standing    Number of Occurrences:   1    Order Specific Question:   Mode or (Route)    Answer:   Nasal cannula    Order Specific Question:   Liters Per Minute    Answer:   2    Order Specific Question:   Keep O2 saturation between    Answer:   greater than 92 %   Incentive spirometry    Standing Status:   Standing    Number of Occurrences:   1   ED EKG    Standing Status:   Standing    Number of Occurrences:   1    Order Specific Question:   Reason for Exam    Answer:   Weakness   EKG 12-Lead    Standing Status:   Standing    Number of Occurrences:   1   EKG 12-lead    Standing Status:   Standing    Number of Occurrences:    20    Order Specific Question:   Notes    Answer:   atrial fibrillation / atrial flutter   Insert peripheral IV    Standing Status:   Standing    Number of Occurrences:   1   Admit to Inpatient (patient's expected length of stay will be greater than 2 midnights or inpatient only procedure)    Standing Status:   Standing    Number of Occurrences:   1    Order Specific Question:   Hospital Area    Answer:   [facility] [[id number]]    Order Specific Question:   Level of Care    Answer:   Telemetry Cardiac [103]    Order Specific Question:   Covid Evaluation    Answer:   Asymptomatic - no recent exposure (last  10 days) testing not required    Order Specific Question:   Diagnosis    Answer:   Fall at home, initial encounter [phone]    Order Specific Question:   Admitting Physician    Answer:   Darrold Junker    Order Specific Question:   Attending Physician    Answer:   Darrold Junker    Order Specific Question:   Certification:    Answer:   I certify this patient will need inpatient services for at least 2 midnights    Order Specific Question:   Expected Medical Readiness    Answer:   03/20/2023   Aspiration precautions    Standing Status:   Standing    Number of Occurrences:   1   Fall precautions    Standing Status:   Standing    Number of Occurrences:   1

## 2023-03-17 NOTE — ED Provider Notes (Signed)
[facility] Provider Note   Event Date/Time   First MD Initiated Contact with Patient 03/17/23 1936     (approximate) History  Fall (Pt attempts to get up from chair @ home and falls from standing resulting in crown of scalp laceration and possible rt hip fx rt hip noted to be turned and shortened laceration appears to have stopped bleeding @ this time pt is aox4 )  HPI Eric Hicks is a 89 y.o. male with a past medical history of COPD, hypertension, paroxysmal atrial fibrillation, and osteoporosis who presents after a mechanical fall while attempting to stand up from his chair at home.  Patient has a laceration to the crown of the scalp, right hip shortening and significant pain, and upper posterior neck pain. ROS: Patient currently denies any vision changes, tinnitus, difficulty speaking, facial droop, sore throat, chest pain, shortness of breath, abdominal pain, nausea/vomiting/diarrhea, dysuria, or weakness/numbness/paresthesias in any extremity   Physical Exam  Triage Vital Signs: ED Triage Vitals  Encounter Vitals Group     BP 03/17/23 1919 (!) 167/104     Systolic BP Percentile --      Diastolic BP Percentile --      Pulse --      Resp 03/17/23 1919 16     Temp 03/17/23 1919 97.7 F (36.5 C)     Temp Source 03/17/23 1919 Oral     SpO2 03/17/23 1907 94 %     Weight 03/17/23 1920 160 lb (72.6 kg)     Height --      Head Circumference --      Peak Flow --      Pain Score --      Pain Loc --      Pain Education --      Exclude from Growth Chart --    Most recent vital signs: Vitals:   03/17/23 1907 03/17/23 1919  BP:  (!) 167/104  Resp:  16  Temp:  97.7 F (36.5 C)  SpO2: 94% 94%   General: Awake, oriented x4. CV:  Good peripheral perfusion.  Resp:  Normal effort.  Abd:  No distention.  Other:  Elderly well-developed, well-nourished Caucasian male resting comfortably in no acute distress.  Tenderness to palpation over right lateral hip.   Tenderness to palpation over posterior cervical spine.  3 cm superficial laceration to the crown of the scalp that is hemostatic ED Results / Procedures / Treatments  Labs (all labs ordered are listed, but only abnormal results are displayed) Labs Reviewed  CBC WITH DIFFERENTIAL/PLATELET - Abnormal; Notable for the following components:      Result Value   WBC 11.2 (*)    RBC 3.74 (*)    Hemoglobin 12.1 (*)    HCT 37.6 (*)    MCV 100.5 (*)    Neutro Abs 9.6 (*)    Abs Immature Granulocytes 0.23 (*)    All other components within normal limits  COMPREHENSIVE METABOLIC PANEL  TROPONIN I (HIGH SENSITIVITY)   EKG ED ECG REPORT I, Merwyn Katos, the attending physician, personally viewed and interpreted this ECG. Date: 03/17/2023 EKG Time: 2049 Rate: 92 Rhythm: Atrial fibrillation QRS Axis: normal Intervals: normal ST/T Wave abnormalities: normal Narrative Interpretation: Atrial fibrillation.  No evidence of acute ischemia RADIOLOGY ED MD interpretation: X-ray of the right hip shows a fracture through the medial wall of the right acetabulum  CT of the cervical spine independently interpreted shows a type II odontoid fracture with  minimal distraction of the fracture site as well as a comminuted right C1 facet fracture and traverse right C1 lamina fracture  CT of the head without contrast interpreted by me shows no evidence of acute abnormalities including no intracerebral hemorrhage, obvious masses, or significant edema -Agree with radiology assessment Official radiology report(s): DG Hip Unilat With Pelvis 2-3 Views Right  Result Date: 03/17/2023 CLINICAL DATA:  Fall, right hip pain EXAM: DG HIP (WITH OR WITHOUT PELVIS) 2-3V RIGHT COMPARISON:  03/24/2022 FINDINGS: Interval right hip replacement. There is a fracture through the medial wall of the acetabulum with mild protrusio. No subluxation or dislocation. IMPRESSION: Right hip replacement. Fracture through the medial wall of the  right acetabulum. Electronically Signed   By: Charlett Nose M.D.   On: 03/17/2023 20:31   CT Cervical Spine Wo Contrast  Result Date: 03/17/2023 CLINICAL DATA:  Larey Seat, head trauma EXAM: CT CERVICAL SPINE WITHOUT CONTRAST TECHNIQUE: Multidetector CT imaging of the cervical spine was performed without intravenous contrast. Multiplanar CT image reconstructions were also generated. RADIATION DOSE REDUCTION: This exam was performed according to the departmental dose-optimization program which includes automated exposure control, adjustment of the mA and/or kV according to patient size and/or use of iterative reconstruction technique. COMPARISON:  03/23/2022 FINDINGS: Alignment: Stable mild right convex scoliosis. Skull base and vertebrae: There is a minimally distracted type 2 odontoid fracture, with 2 mm of separation at the fracture site. Otherwise alignment is anatomic. There is also a nondisplaced fracture through the right lamina at C1, with fracture extending through the right C1 facet. Mild comminution at the fracture site. Alignment is anatomic. No other acute displaced cervical spine fractures. Soft tissues and spinal canal: No evidence of canal hematoma. No prevertebral soft tissue swelling. Disc levels: There are severe degenerative changes at the C1-C2 interface, right much greater than left. There is diffuse facet hypertrophy and spondylosis, most pronounced at the C5-6 and C6-7 levels. Upper chest: Airway is patent. Continue masslike consolidation within the right upper lobe, incompletely evaluated on this study. Other: Reconstructed images confirm the above findings. IMPRESSION: 1. Type 2 odontoid fracture, with minimal distraction of the fracture site. Otherwise near anatomic alignment. 2. Comminuted right C1 facet fracture and transverse right C1 lamina fracture, with near anatomic alignment. 3. Extensive multilevel cervical degenerative changes as above. 4. Continued masslike consolidation within the  right upper lobe, incompletely evaluated on this study. Critical Value/emergent results were called by telephone at the time of interpretation on 03/17/2023 at 8:30 pm to provider [facility] , who verbally acknowledged these results. Electronically Signed   By: Sharlet Salina M.D.   On: 03/17/2023 20:30   CT Head Wo Contrast  Result Date: 03/17/2023 CLINICAL DATA:  Larey Seat today, head trauma EXAM: CT HEAD WITHOUT CONTRAST TECHNIQUE: Contiguous axial images were obtained from the base of the skull through the vertex without intravenous contrast. RADIATION DOSE REDUCTION: This exam was performed according to the departmental dose-optimization program which includes automated exposure control, adjustment of the mA and/or kV according to patient size and/or use of iterative reconstruction technique. COMPARISON:  03/23/2022 FINDINGS: Brain: Diffuse cerebral atrophy, likely age appropriate. No acute infarct or hemorrhage. Lateral ventricles and midline structures are unremarkable. No acute extra-axial fluid collections. No mass effect. Vascular: No hyperdense vessel or unexpected calcification. Stable atherosclerosis. Skull: Scalp hematoma laceration along the midline parietal convexity. No underlying fracture. The remainder of the calvarium is unremarkable. Sinuses/Orbits: No acute finding. Other: None. IMPRESSION: 1. Scalp hematoma laceration along the midline parietal  convexity. No underlying fracture. 2. No acute intracranial process. Electronically Signed   By: Sharlet Salina M.D.   On: 03/17/2023 20:23   PROCEDURES: Critical Care performed: Yes, see critical care procedure note(s) Procedures MEDICATIONS ORDERED IN ED: Medications  HYDROmorphone (DILAUDID) injection 0.5 mg (has no administration in time range)  ondansetron (ZOFRAN) injection 4 mg (has no administration in time range)   IMPRESSION / MDM / ASSESSMENT AND PLAN / ED COURSE  I reviewed the triage vital signs and the nursing notes.                              The patient is on the cardiac monitor to evaluate for evidence of arrhythmia and/or significant heart rate changes. Patient's presentation is most consistent with acute presentation with potential threat to life or bodily function. Presenting after a fall that occurred just prior to arrival, resulting in injury to the neck and right hip. The mechanism of injury was a mechanical ground level fall with syncope or near-syncope. The current level of pain is moderate. There was no loss of consciousness, confusion, seizure, or memory impairment. There is a laceration associated with the injury. Denies neck pain. The patient does not take blood thinner medications. Denies vomiting, numbness/weakness, fever CT C-spine shows type II odontoid fracture X-ray of the right hip shows single nondisplaced acetabular fracture I spoke to Dr. Katrinka Blazing in neurosurgery who requested an MRI and will review films to assess for surgical intervention  Dispo: Admit to medicine       FINAL CLINICAL IMPRESSION(S) / ED DIAGNOSES   Final diagnoses:  Closed odontoid fracture with type II morphology and anterior displacement, initial encounter (HCC)  Closed nondisplaced fracture of medial wall of right acetabulum, initial encounter (HCC)  Fall, initial encounter   Rx / DC Orders   ED Discharge Orders     None      Note:  This document was prepared using Dragon voice recognition software and may include unintentional dictation errors.   Merwyn Katos, MD 03/17/23 2322

## 2023-03-17 NOTE — ED Notes (Signed)
C collar placement per ED physician order pt tolerated well

## 2023-03-17 NOTE — Progress Notes (Signed)
Reviewed his imaging, prior CT scan was noted from November 2023 without the type II dens fracture.  However some of the appearance of this fracture appears to be corticated and may indicate that it is somewhat chronic.  An MRI without contrast of the cervical spine will help to understand its chronicity and help [place] understand whether or not there is a fibrous pannus growing.  At this point continue to keep in a cervical collar, we will perform a full formal evaluation on rounds in the morning.

## 2023-03-18 ENCOUNTER — Emergency Department: Payer: Medicare Other

## 2023-03-18 ENCOUNTER — Encounter: Payer: Self-pay | Admitting: Internal Medicine

## 2023-03-18 DIAGNOSIS — J9811 Atelectasis: Secondary | ICD-10-CM | POA: Diagnosis not present

## 2023-03-18 DIAGNOSIS — E43 Unspecified severe protein-calorie malnutrition: Secondary | ICD-10-CM | POA: Diagnosis not present

## 2023-03-18 DIAGNOSIS — Z86711 Personal history of pulmonary embolism: Secondary | ICD-10-CM | POA: Diagnosis not present

## 2023-03-18 DIAGNOSIS — J984 Other disorders of lung: Secondary | ICD-10-CM | POA: Diagnosis not present

## 2023-03-18 DIAGNOSIS — Z833 Family history of diabetes mellitus: Secondary | ICD-10-CM | POA: Diagnosis not present

## 2023-03-18 DIAGNOSIS — Y92009 Unspecified place in unspecified non-institutional (private) residence as the place of occurrence of the external cause: Secondary | ICD-10-CM

## 2023-03-18 DIAGNOSIS — S72001S Fracture of unspecified part of neck of right femur, sequela: Secondary | ICD-10-CM

## 2023-03-18 DIAGNOSIS — I82442 Acute embolism and thrombosis of left tibial vein: Secondary | ICD-10-CM | POA: Diagnosis not present

## 2023-03-18 DIAGNOSIS — Z7189 Other specified counseling: Secondary | ICD-10-CM | POA: Diagnosis not present

## 2023-03-18 DIAGNOSIS — E1122 Type 2 diabetes mellitus with diabetic chronic kidney disease: Secondary | ICD-10-CM | POA: Diagnosis present

## 2023-03-18 DIAGNOSIS — R918 Other nonspecific abnormal finding of lung field: Secondary | ICD-10-CM | POA: Diagnosis not present

## 2023-03-18 DIAGNOSIS — Z85118 Personal history of other malignant neoplasm of bronchus and lung: Secondary | ICD-10-CM | POA: Diagnosis not present

## 2023-03-18 DIAGNOSIS — Z807 Family history of other malignant neoplasms of lymphoid, hematopoietic and related tissues: Secondary | ICD-10-CM | POA: Diagnosis not present

## 2023-03-18 DIAGNOSIS — N184 Chronic kidney disease, stage 4 (severe): Secondary | ICD-10-CM | POA: Diagnosis not present

## 2023-03-18 DIAGNOSIS — Z8249 Family history of ischemic heart disease and other diseases of the circulatory system: Secondary | ICD-10-CM | POA: Diagnosis not present

## 2023-03-18 DIAGNOSIS — J90 Pleural effusion, not elsewhere classified: Secondary | ICD-10-CM | POA: Diagnosis not present

## 2023-03-18 DIAGNOSIS — R296 Repeated falls: Secondary | ICD-10-CM | POA: Diagnosis present

## 2023-03-18 DIAGNOSIS — M80051A Age-related osteoporosis with current pathological fracture, right femur, initial encounter for fracture: Secondary | ICD-10-CM | POA: Diagnosis present

## 2023-03-18 DIAGNOSIS — I5032 Chronic diastolic (congestive) heart failure: Secondary | ICD-10-CM

## 2023-03-18 DIAGNOSIS — G311 Senile degeneration of brain, not elsewhere classified: Secondary | ICD-10-CM | POA: Diagnosis present

## 2023-03-18 DIAGNOSIS — S199XXA Unspecified injury of neck, initial encounter: Secondary | ICD-10-CM | POA: Diagnosis not present

## 2023-03-18 DIAGNOSIS — N400 Enlarged prostate without lower urinary tract symptoms: Secondary | ICD-10-CM | POA: Diagnosis present

## 2023-03-18 DIAGNOSIS — R0902 Hypoxemia: Secondary | ICD-10-CM | POA: Diagnosis present

## 2023-03-18 DIAGNOSIS — I130 Hypertensive heart and chronic kidney disease with heart failure and stage 1 through stage 4 chronic kidney disease, or unspecified chronic kidney disease: Secondary | ICD-10-CM | POA: Diagnosis present

## 2023-03-18 DIAGNOSIS — W19XXXA Unspecified fall, initial encounter: Secondary | ICD-10-CM

## 2023-03-18 DIAGNOSIS — Z79899 Other long term (current) drug therapy: Secondary | ICD-10-CM | POA: Diagnosis not present

## 2023-03-18 DIAGNOSIS — E785 Hyperlipidemia, unspecified: Secondary | ICD-10-CM | POA: Diagnosis present

## 2023-03-18 DIAGNOSIS — R600 Localized edema: Secondary | ICD-10-CM | POA: Diagnosis not present

## 2023-03-18 DIAGNOSIS — J440 Chronic obstructive pulmonary disease with acute lower respiratory infection: Secondary | ICD-10-CM | POA: Diagnosis present

## 2023-03-18 DIAGNOSIS — Z87891 Personal history of nicotine dependence: Secondary | ICD-10-CM | POA: Diagnosis not present

## 2023-03-18 DIAGNOSIS — S72001A Fracture of unspecified part of neck of right femur, initial encounter for closed fracture: Secondary | ICD-10-CM | POA: Diagnosis not present

## 2023-03-18 DIAGNOSIS — S12000A Unspecified displaced fracture of first cervical vertebra, initial encounter for closed fracture: Secondary | ICD-10-CM | POA: Diagnosis not present

## 2023-03-18 DIAGNOSIS — M8008XA Age-related osteoporosis with current pathological fracture, vertebra(e), initial encounter for fracture: Secondary | ICD-10-CM | POA: Diagnosis present

## 2023-03-18 DIAGNOSIS — Z66 Do not resuscitate: Secondary | ICD-10-CM | POA: Diagnosis present

## 2023-03-18 DIAGNOSIS — S12110A Anterior displaced Type II dens fracture, initial encounter for closed fracture: Secondary | ICD-10-CM | POA: Diagnosis not present

## 2023-03-18 DIAGNOSIS — J439 Emphysema, unspecified: Secondary | ICD-10-CM | POA: Diagnosis present

## 2023-03-18 DIAGNOSIS — S32474A Nondisplaced fracture of medial wall of right acetabulum, initial encounter for closed fracture: Secondary | ICD-10-CM | POA: Diagnosis not present

## 2023-03-18 DIAGNOSIS — S0101XA Laceration without foreign body of scalp, initial encounter: Secondary | ICD-10-CM | POA: Diagnosis not present

## 2023-03-18 DIAGNOSIS — Z515 Encounter for palliative care: Secondary | ICD-10-CM | POA: Diagnosis not present

## 2023-03-18 DIAGNOSIS — I480 Paroxysmal atrial fibrillation: Secondary | ICD-10-CM | POA: Diagnosis present

## 2023-03-18 DIAGNOSIS — W1830XA Fall on same level, unspecified, initial encounter: Secondary | ICD-10-CM | POA: Diagnosis present

## 2023-03-18 DIAGNOSIS — S32471A Displaced fracture of medial wall of right acetabulum, initial encounter for closed fracture: Secondary | ICD-10-CM | POA: Diagnosis not present

## 2023-03-18 LAB — BLOOD GAS, ARTERIAL
Acid-base deficit: 5.1 mmol/L — ABNORMAL HIGH (ref 0.0–2.0)
Bicarbonate: 21.2 mmol/L (ref 20.0–28.0)
FIO2: 21 %
O2 Saturation: 91.6 %
Patient temperature: 37
pCO2 arterial: 43 mm[Hg] (ref 32–48)
pH, Arterial: 7.3 — ABNORMAL LOW (ref 7.35–7.45)
pO2, Arterial: 62 mm[Hg] — ABNORMAL LOW (ref 83–108)

## 2023-03-18 LAB — CBC WITH DIFFERENTIAL/PLATELET
Abs Immature Granulocytes: 0.19 10*3/uL — ABNORMAL HIGH (ref 0.00–0.07)
Basophils Absolute: 0.1 10*3/uL (ref 0.0–0.1)
Basophils Relative: 0 %
Eosinophils Absolute: 0 10*3/uL (ref 0.0–0.5)
Eosinophils Relative: 0 %
HCT: 38.5 % — ABNORMAL LOW (ref 39.0–52.0)
Hemoglobin: 12.5 g/dL — ABNORMAL LOW (ref 13.0–17.0)
Immature Granulocytes: 1 %
Lymphocytes Relative: 2 %
Lymphs Abs: 0.4 10*3/uL — ABNORMAL LOW (ref 0.7–4.0)
MCH: 32.8 pg (ref 26.0–34.0)
MCHC: 32.5 g/dL (ref 30.0–36.0)
MCV: 101 fL — ABNORMAL HIGH (ref 80.0–100.0)
Monocytes Absolute: 0.7 10*3/uL (ref 0.1–1.0)
Monocytes Relative: 5 %
Neutro Abs: 13.8 10*3/uL — ABNORMAL HIGH (ref 1.7–7.7)
Neutrophils Relative %: 92 %
Platelets: 162 10*3/uL (ref 150–400)
RBC: 3.81 MIL/uL — ABNORMAL LOW (ref 4.22–5.81)
RDW: 14.6 % (ref 11.5–15.5)
WBC: 15.1 10*3/uL — ABNORMAL HIGH (ref 4.0–10.5)
nRBC: 0 % (ref 0.0–0.2)

## 2023-03-18 LAB — CBG MONITORING, ED
Glucose-Capillary: 130 mg/dL — ABNORMAL HIGH (ref 70–99)
Glucose-Capillary: 199 mg/dL — ABNORMAL HIGH (ref 70–99)

## 2023-03-18 LAB — BLOOD GAS, VENOUS
Acid-base deficit: 6.5 mmol/L — ABNORMAL HIGH (ref 0.0–2.0)
Bicarbonate: 21.7 mmol/L (ref 20.0–28.0)
O2 Saturation: 98.5 %
Patient temperature: 37
pCO2, Ven: 53 mm[Hg] (ref 44–60)
pH, Ven: 7.22 — ABNORMAL LOW (ref 7.25–7.43)
pO2, Ven: 92 mm[Hg] — ABNORMAL HIGH (ref 32–45)

## 2023-03-18 LAB — COMPREHENSIVE METABOLIC PANEL
ALT: 43 U/L (ref 0–44)
AST: 36 U/L (ref 15–41)
Albumin: 3.4 g/dL — ABNORMAL LOW (ref 3.5–5.0)
Alkaline Phosphatase: 72 U/L (ref 38–126)
Anion gap: 11 (ref 5–15)
BUN: 44 mg/dL — ABNORMAL HIGH (ref 8–23)
CO2: 23 mmol/L (ref 22–32)
Calcium: 8.8 mg/dL — ABNORMAL LOW (ref 8.9–10.3)
Chloride: 110 mmol/L (ref 98–111)
Creatinine, Ser: 1.5 mg/dL — ABNORMAL HIGH (ref 0.61–1.24)
GFR, Estimated: 43 mL/min — ABNORMAL LOW (ref >60)
Glucose, Bld: 160 mg/dL — ABNORMAL HIGH (ref 70–99)
Potassium: 4 mmol/L (ref 3.5–5.1)
Sodium: 144 mmol/L (ref 135–145)
Total Bilirubin: 0.9 mg/dL (ref <1.2)
Total Protein: 6.3 g/dL — ABNORMAL LOW (ref 6.5–8.1)

## 2023-03-18 LAB — GLUCOSE, CAPILLARY
Glucose-Capillary: 102 mg/dL — ABNORMAL HIGH (ref 70–99)
Glucose-Capillary: 190 mg/dL — ABNORMAL HIGH (ref 70–99)

## 2023-03-18 LAB — PROCALCITONIN: Procalcitonin: <0.1 ng/mL

## 2023-03-18 LAB — MAGNESIUM: Magnesium: 2.1 mg/dL (ref 1.7–2.4)

## 2023-03-18 LAB — LACTIC ACID, PLASMA
Lactic Acid, Venous: 1 mmol/L (ref 0.5–1.9)
Lactic Acid, Venous: 0.9 mmol/L (ref 0.5–1.9)

## 2023-03-18 MED ORDER — ADULT MULTIVITAMIN W/MINERALS CH
1.0000 | ORAL_TABLET | Freq: Every day | ORAL | Status: DC
  Administered 2023-03-18 – 2023-03-19 (×2): 1 via ORAL
  Filled 2023-03-18 (×3): qty 1

## 2023-03-18 MED ORDER — NICOTINE 14 MG/24HR TD PT24: 14.0000 mg | MEDICATED_PATCH | Freq: Every day | TRANSDERMAL | Status: DC

## 2023-03-18 MED ORDER — ENSURE ENLIVE PO LIQD
237.0000 mL | Freq: Three times a day (TID) | ORAL | Status: DC
  Administered 2023-03-18 – 2023-03-20 (×6): 237 mL via ORAL

## 2023-03-18 MED ORDER — DORZOLAMIDE HCL-TIMOLOL MAL 2-0.5 % OP SOLN
1.0000 [drp] | Freq: Two times a day (BID) | OPHTHALMIC | Status: DC
  Administered 2023-03-18 – 2023-03-20 (×5): 1 [drp] via OPHTHALMIC
  Filled 2023-03-18: qty 10

## 2023-03-18 MED ORDER — FLUTICASONE FUROATE-VILANTEROL 100-25 MCG/ACT IN AEPB
1.0000 | INHALATION_SPRAY | Freq: Every day | RESPIRATORY_TRACT | Status: DC
  Administered 2023-03-18 – 2023-03-20 (×3): 1 via RESPIRATORY_TRACT
  Filled 2023-03-18: qty 28

## 2023-03-18 MED ORDER — HYDRALAZINE HCL 20 MG/ML IJ SOLN
5.0000 mg | INTRAMUSCULAR | Status: DC | PRN
  Administered 2023-03-19 (×2): 5 mg via INTRAVENOUS
  Filled 2023-03-18 (×3): qty 1

## 2023-03-18 MED ORDER — DEXTROSE 5 % IV SOLN
250.0000 mg | INTRAVENOUS | Status: DC
  Filled 2023-03-18: qty 2.5

## 2023-03-18 MED ORDER — IPRATROPIUM-ALBUTEROL 0.5-2.5 (3) MG/3ML IN SOLN: 3.0000 mL | RESPIRATORY_TRACT | Status: DC | PRN

## 2023-03-18 MED ORDER — SODIUM CHLORIDE 0.9% FLUSH
3.0000 mL | Freq: Two times a day (BID) | INTRAVENOUS | Status: DC
  Administered 2023-03-18 – 2023-03-20 (×6): 3 mL via INTRAVENOUS

## 2023-03-18 MED ORDER — BRIMONIDINE TARTRATE 0.2 % OP SOLN
1.0000 [drp] | Freq: Three times a day (TID) | OPHTHALMIC | Status: DC
  Administered 2023-03-18 – 2023-03-20 (×7): 1 [drp] via OPHTHALMIC
  Filled 2023-03-18: qty 5

## 2023-03-18 MED ORDER — IPRATROPIUM-ALBUTEROL 0.5-2.5 (3) MG/3ML IN SOLN
3.0000 mL | Freq: Four times a day (QID) | RESPIRATORY_TRACT | Status: DC | PRN
Start: 2023-03-18 — End: 2023-03-18

## 2023-03-18 MED ORDER — INSULIN ASPART 100 UNIT/ML IJ SOLN
0.0000 [IU] | INTRAMUSCULAR | Status: DC | PRN
  Administered 2023-03-18: 2 [IU] via SUBCUTANEOUS
  Filled 2023-03-18: qty 1

## 2023-03-18 MED ORDER — INSULIN ASPART 100 UNIT/ML IJ SOLN
0.0000 [IU] | Freq: Every day | INTRAMUSCULAR | Status: DC
Start: 2023-03-19 — End: 2023-03-20

## 2023-03-18 MED ORDER — DEXTROSE-SODIUM CHLORIDE 5-0.9 % IV SOLN: INTRAVENOUS | Status: AC

## 2023-03-18 MED ORDER — ATORVASTATIN CALCIUM 20 MG PO TABS
20.0000 mg | ORAL_TABLET | ORAL | Status: DC
  Administered 2023-03-19: 20 mg via ORAL
  Filled 2023-03-18: qty 1

## 2023-03-18 MED ORDER — INSULIN ASPART 100 UNIT/ML IJ SOLN
0.0000 [IU] | Freq: Three times a day (TID) | INTRAMUSCULAR | Status: DC
Start: 2023-03-19 — End: 2023-03-20
  Administered 2023-03-19 (×2): 2 [IU] via SUBCUTANEOUS
  Administered 2023-03-20: 1 [IU] via SUBCUTANEOUS
  Filled 2023-03-18 (×2): qty 1

## 2023-03-18 MED ORDER — LATANOPROST 0.005 % OP SOLN
1.0000 [drp] | Freq: Every day | OPHTHALMIC | Status: DC
  Administered 2023-03-18 – 2023-03-19 (×2): 1 [drp] via OPHTHALMIC
  Filled 2023-03-18 (×2): qty 2.5

## 2023-03-18 NOTE — Consult Note (Signed)
Consulting Department:  Inpatient medicine  Primary Physician:  Sherlene Shams, MD  Chief Complaint: Cervical spine fracture  History of Present Illness: 03/18/2023 Eric Hicks is a 89 y.o. male who presents with the chief complaint of cervical spine fracture.  He has had multiple recent falls.  He states that his worst fall was actually back in April which she has had chronic neck pain ever since.  This fall he felt like was minor in comparison to that.  However on evaluation he was found to have a C2/C1 combination fracture.  He has not had any new neurologic deficits.  He does have neck pain, states it is not any worse than it was previously.  Not having any bowel or bladder symptoms.  The symptoms are causing a significant impact on the patient's life.   Review of Systems:  A 10 point review of systems is negative, except for the pertinent positives and negatives detailed in the HPI.  Past Medical History: Past Medical History:  Diagnosis Date   BPH (benign prostatic hyperplasia)    managed by Garner Nash   COPD (chronic obstructive pulmonary disease) (HCC)    Elevated PSA, between 10 and less than 20 ng/ml    Garner Nash, watchful waiting   Hypertension    Osteoporosis    by DEXA    Past Surgical History: Past Surgical History:  Procedure Laterality Date   HIP ARTHROPLASTY Right 03/24/2022   Procedure: ARTHROPLASTY BIPOLAR HIP (HEMIARTHROPLASTY);  Surgeon: Reinaldo Berber, MD;  Location: [facility] ORS;  Service: Orthopedics;  Laterality: Right;   TYMPANOSTOMY TUBE PLACEMENT  2017    Allergies: Allergies as of 03/11/2023 - Review Complete 03/05/2023  Allergen Reaction Noted   Tylenol [acetaminophen] Rash 11/10/2012    Medications:  Current Facility-Administered Medications:    [START ON 03/19/2023] atorvastatin (LIPITOR) tablet 20 mg, 20 mg, Oral, QODAY, Patel, Ekta V, MD   brimonidine (ALPHAGAN) 0.2 % ophthalmic solution 1 drop, 1 drop, Both Eyes, TID, Irena Cords V, MD, 1  drop at 03/18/23 1516   cefTRIAXone (ROCEPHIN) 2 g in sodium chloride 0.9 % 100 mL IVPB, 2 g, Intravenous, Q24H, Gertha Calkin, MD, Stopped at 03/18/23 0044   dextrose 5 %-0.9 % sodium chloride infusion, , Intravenous, Continuous, Irena Cords V, MD, Last Rate: 30 mL/hr at 03/18/23 1544, Infusion Verify at 03/18/23 1544   dorzolamide-timolol (COSOPT) 2-0.5 % ophthalmic solution 1 drop, 1 drop, Both Eyes, BID, Irena Cords V, MD, 1 drop at 03/18/23 0953   feeding supplement (ENSURE ENLIVE / ENSURE PLUS) liquid 237 mL, 237 mL, Oral, TID BM, Sherryll Burger, Vipul, MD, 237 mL at 03/18/23 1515   fluticasone furoate-vilanterol (BREO ELLIPTA) 100-25 MCG/ACT 1 puff, 1 puff, Inhalation, Daily, Irena Cords V, MD, 1 puff at 03/18/23 1007   hydrALAZINE (APRESOLINE) injection 5 mg, 5 mg, Intravenous, Q4H PRN, Irena Cords V, MD   HYDROmorphone (DILAUDID) injection 0.5 mg, 0.5 mg, Intravenous, Q2H PRN, Merwyn Katos, MD, 0.5 mg at 02/25/2023 2253   insulin aspart (novoLOG) injection 0-9 Units, 0-9 Units, Subcutaneous, Q4H PRN, Irena Cords V, MD, 2 Units at 03/18/23 0525   ipratropium-albuterol (DUONEB) 0.5-2.5 (3) MG/3ML nebulizer solution 3 mL, 3 mL, Nebulization, Q4H PRN, Allena Katz, Ekta V, MD   latanoprost (XALATAN) 0.005 % ophthalmic solution 1 drop, 1 drop, Both Eyes, QHS, Patel, Ekta V, MD   multivitamin with minerals tablet 1 tablet, 1 tablet, Oral, Daily, Delfino Lovett, MD, 1 tablet at 03/18/23 0952   sodium chloride flush (NS) 0.9 %  injection 3 mL, 3 mL, Intravenous, Q12H, Gertha Calkin, MD, 3 mL at 03/18/23 0957   Social History: Social History   Tobacco Use   Smoking status: Former   Smokeless tobacco: Never  Advertising account planner   Vaping status: Never Used  Substance Use Topics   Alcohol use: No    Family Medical History: Family History  Problem Relation Age of Onset   Diabetes Mother    Hypertension Father    Cancer Brother 29       multiple myeloma    Physical Examination: Vitals:   03/18/23 0849 03/18/23  1252  BP: (!) 153/99 (!) 128/90  Pulse: 77 80  Resp:    Temp:    SpO2: (!) 88% 94%     General: Patient is well developed, well nourished, calm, collected, and in no apparent distress.  NEUROLOGICAL:  General: In no acute distress.   Awake, alert, oriented to person, place, and time.  Pupils equal round and reactive to light.  Facial tone is symmetric.  Tongue protrusion is midline.  There is no pronator drift.  Strength:No major gross deficits noted  Bilateral upper and lower extremity sensation is intact to light touch.  Clonus is not present.  Toes are down-going.    Imaging: Narrative & Impression  CLINICAL DATA:  Larey Seat, head trauma   EXAM: CT CERVICAL SPINE WITHOUT CONTRAST   TECHNIQUE: Multidetector CT imaging of the cervical spine was performed without intravenous contrast. Multiplanar CT image reconstructions were also generated.   RADIATION DOSE REDUCTION: This exam was performed according to the departmental dose-optimization program which includes automated exposure control, adjustment of the mA and/or kV according to patient size and/or use of iterative reconstruction technique.   COMPARISON:  03/23/2022   FINDINGS: Alignment: Stable mild right convex scoliosis.   Skull base and vertebrae: There is a minimally distracted type 2 odontoid fracture, with 2 mm of separation at the fracture site. Otherwise alignment is anatomic.   There is also a nondisplaced fracture through the right lamina at C1, with fracture extending through the right C1 facet. Mild comminution at the fracture site. Alignment is anatomic.   No other acute displaced cervical spine fractures.   Soft tissues and spinal canal: No evidence of canal hematoma. No prevertebral soft tissue swelling.   Disc levels: There are severe degenerative changes at the C1-C2 interface, right much greater than left. There is diffuse facet hypertrophy and spondylosis, most pronounced at the C5-6 and  C6-7 levels.   Upper chest: Airway is patent. Continue masslike consolidation within the right upper lobe, incompletely evaluated on this study.   Other: Reconstructed images confirm the above findings.   IMPRESSION: 1. Type 2 odontoid fracture, with minimal distraction of the fracture site. Otherwise near anatomic alignment. 2. Comminuted right C1 facet fracture and transverse right C1 lamina fracture, with near anatomic alignment. 3. Extensive multilevel cervical degenerative changes as above. 4. Continued masslike consolidation within the right upper lobe, incompletely evaluated on this study.   Critical Value/emergent results were called by telephone at the time of interpretation on 03/18/2023 at 8:30 pm to provider [facility] , who verbally acknowledged these results.     Electronically Signed   By: Sharlet Salina M.D.   On: 02/28/2023 20:30     Narrative & Impression  CLINICAL DATA:  Initial evaluation for acute trauma.   EXAM: MRI CERVICAL SPINE WITHOUT CONTRAST   TECHNIQUE: Multiplanar, multisequence MR imaging of the cervical spine was performed. No intravenous contrast  was administered.   COMPARISON:  Prior CT from earlier the same day.   FINDINGS: Alignment: Straightening of the normal cervical lordosis. Trace retrolisthesis of C3 on C4, with trace anterolisthesis of C4 on C5, C7 on T1, and T1 on T2. Findings likely chronic and degenerative.   Vertebrae: Previously identified oblique type 2 odontoid fracture with minimal distraction again noted, stable from prior CT. Concomitant C1 fractures noted as well, also grossly stable, better seen on prior exam. Associated reactive marrow edema, consistent with acute fractures. No significant displacement or malalignment.   No other acute or recent fracture. Mild chronic height loss present at the superior endplates of T3 and T4. Underlying bone marrow signal intensity within normal limits. No worrisome osseous  lesions.   Cord: Normal signal and morphology. No evidence for traumatic cord injury.   Posterior Fossa, vertebral arteries, paraspinal tissues: Age-related atrophy noted within the visualized brain. Craniocervical junction within normal limits. Mild edema within the upper prevertebral soft tissues related to the adjacent odontoid fracture. The major ligamentous structures appear intact. Normal flow voids seen within the vertebral arteries bilaterally.   Disc levels:   C1-2: Degenerative osteoarthritic changes about the atlantodental articulation with underlying C1 and C2 fractures. No stenosis.   C2-C3: Minimal disc bulge. Moderate left facet hypertrophy. No significant stenosis.   C3-C4: Mild disc bulge with uncovertebral spurring. Mild bilateral facet hypertrophy. No spinal stenosis. Mild to moderate left C4 foraminal narrowing. Right neural foramen remains patent.   C4-C5: Mild disc bulge with left-sided uncovertebral spurring. Mild left-sided facet hypertrophy. No spinal stenosis. Mild left C5 foraminal narrowing. Right neural foramen remains patent.   C5-C6: Degenerate intervertebral disc space narrowing with circumferential disc osteophyte complex. No significant spinal stenosis. Moderate right C6 foraminal narrowing. Left neural foramen remains patent.   C6-C7: Degenerate intervertebral disc space narrowing with circumferential disc osteophyte complex. No spinal stenosis. Moderate right C7 foraminal narrowing. Left neural foramen remains patent.   C7-T1: Anterolisthesis without significant disc bulge. Moderate right facet hypertrophy. No spinal stenosis. Foramina remain   IMPRESSION: 1. Acute type 2 odontoid fracture with minimal distraction, with concomitant C1 fractures. Findings are stable and better visualized on prior CT. 2. No other acute traumatic injury within the cervical spine. No evidence for traumatic cord injury. Major ligamentous  structures intact. 3. Underlying multilevel cervical spondylosis without significant spinal stenosis. Mild to moderate left C4 and C5 foraminal narrowing, with moderate right C6 and C7 foraminal stenosis.     Electronically Signed   By: Rise Mu M.D.   On: 03/16/2023 23:51      I have personally reviewed the images and agree with the above interpretation.  Labs:    Latest Ref Rng & Units 03/18/2023    4:58 AM 02/27/2023    8:36 PM 12/22/2022    3:02 PM  CBC  WBC 4.0 - 10.5 K/uL 15.1  11.2  7.0   Hemoglobin 13.0 - 17.0 g/dL 29.5  62.1  30.8   Hematocrit 39.0 - 52.0 % 38.5  37.6  33.0   Platelets 150 - 400 K/uL 162  171  214.0        Assessment and Plan: Eric Hicks is a pleasant 89 y.o. male with multiple recent falls who was found to have a C1/C2 fracture noted on CT imaging.  MRI was done in follow-up to evaluate for chronicity of changes as he has had a recent serious fall and April of this year which she has had continuous neck pain  ever since.  He has no new deficits on physical examination.  His CT scan demonstrates a type II odontoid fracture as well as a C1 fracture.  MRI verifies that its likely acute at this level.  He is not having any compressive issues.  We discussed with the family multiple options including surgical fixation versus external fixation with the cervical spinal collar.  Given his age and medical comorbidities family would like to opt for conservative management with a cervical brace.  Will plan on seeing him again in approximately 6 weeks with flexion-extension x-rays to evaluate for any excess motion.  Should he still continue to have excess motion at that point we will plan for another 6 weeks in the collar.  We do feel that he would benefit from an occupational and physical therapy evaluation for placement we will.  Coordinate outpatient follow-up.   Lovenia Kim, MD/MSCR Dept. of Neurosurgery

## 2023-03-18 NOTE — Progress Notes (Signed)
OT Cancellation Note  Patient Details Name: Eric Hicks MRN: [id number] DOB: 1926-05-06   Cancelled Treatment:    Reason Eval/Treat Not Completed: Other (comment). OT order received and chart review. Per neurosurgery note, plans to formally evaluate further this morning. OT will awaiting further instruction in order to safely mobilize pt for evaluation.   Jackquline Denmark, MS, OTR/L , CBIS ascom [phone]  03/18/23, 11:18 AM

## 2023-03-18 NOTE — ED Notes (Signed)
Pt uses urinal approx yellow urine output noted

## 2023-03-18 NOTE — Assessment & Plan Note (Addendum)
Stable, last echo was last year ;   1. Left ventricular ejection fraction, by estimation, is 55 to 60% . The left ventricle has normal function. The left ventricle has no regional wall motion abnormalities. There is mild concentric left ventricular hypertrophy. Left ventricular diastolic parameters are consistent with Grade I diastolic dysfunction ( impaired relaxation) . 2. Right ventricular systolic function is normal. The right ventricular size is normal. 3. Left atrial size was mildly dilated. 4. Right atrial size was mildly dilated. 5. The mitral valve is grossly normal. Mild mitral valve regurgitation. 6. The aortic valve is calcified. Aortic valve regurgitation is mild to moderate. Strict I's and O's, daily weights. Waiting for CT chest noncontrast to evaluate if patient's hypoxia is due to possibly any pleural effusion and also to get better delineation of upper lobe mass which is documented on the CT scan.

## 2023-03-18 NOTE — Progress Notes (Signed)
PT Cancellation Note  Patient Details Name: Eric Hicks MRN: [id number] DOB: 1926-10-21   Cancelled Treatment:    Reason Eval/Treat Not Completed: Medical issues which prohibited therapy. Orders received and chart reviewed. Per EMR pt awaiting neurosurgery consult and possible orthopedic consult for R hip fracture. PT to hold until consults complete for possible weightbearing precautions/status and possible C-spine precautions prior to initiate safe mobility.    Delphia Grates. Fairly IV, PT, DPT Physical Therapist- [place]  [facility]  03/18/2023, 12:43 PM

## 2023-03-18 NOTE — ED Notes (Signed)
Pt uses urinal has approx yellow urine output specimen collected labeled and sent to lab

## 2023-03-18 NOTE — Plan of Care (Signed)
  Problem: Education: Goal: Ability to describe self-care measures that may prevent or decrease complications (Diabetes Survival Skills Education) will improve Outcome: Progressing Goal: Individualized Educational Video(s) Outcome: Progressing   Problem: Coping: Goal: Ability to adjust to condition or change in health will improve Outcome: Progressing   Problem: Fluid Volume: Goal: Ability to maintain a balanced intake and output will improve Outcome: Progressing   Problem: Health Behavior/Discharge Planning: Goal: Ability to identify and utilize available resources and services will improve Outcome: Progressing Goal: Ability to manage health-related needs will improve Outcome: Progressing   Problem: Metabolic: Goal: Ability to maintain appropriate glucose levels will improve Outcome: Progressing   Problem: Nutritional: Goal: Maintenance of adequate nutrition will improve Outcome: Progressing Goal: Progress toward achieving an optimal weight will improve Outcome: Progressing   Problem: Skin Integrity: Goal: Risk for impaired skin integrity will decrease Outcome: Progressing   Problem: Tissue Perfusion: Goal: Adequacy of tissue perfusion will improve Outcome: Progressing   Problem: Education: Goal: Knowledge of General Education information will improve Description: Including pain rating scale, medication(s)/side effects and non-pharmacologic comfort measures Outcome: Progressing   Problem: Health Behavior/Discharge Planning: Goal: Ability to manage health-related needs will improve Outcome: Progressing   Problem: Clinical Measurements: Goal: Ability to maintain clinical measurements within normal limits will improve Outcome: Progressing Goal: Will remain free from infection Outcome: Progressing Goal: Diagnostic test results will improve Outcome: Progressing Goal: Respiratory complications will improve Outcome: Progressing Goal: Cardiovascular complication will  be avoided Outcome: Progressing   Problem: Activity: Goal: Risk for activity intolerance will decrease Outcome: Progressing   Problem: Nutrition: Goal: Adequate nutrition will be maintained Outcome: Progressing   Problem: Coping: Goal: Level of anxiety will decrease Outcome: Progressing   Problem: Elimination: Goal: Will not experience complications related to bowel motility Outcome: Progressing Goal: Will not experience complications related to urinary retention Outcome: Progressing   Problem: Pain Management: Goal: General experience of comfort will improve Outcome: Progressing   Problem: Education: Goal: Ability to demonstrate management of disease process will improve Outcome: Progressing Goal: Ability to verbalize understanding of medication therapies will improve Outcome: Progressing Goal: Individualized Educational Video(s) Outcome: Progressing   Problem: Activity: Goal: Capacity to carry out activities will improve Outcome: Progressing   Problem: Education: Goal: Knowledge of disease or condition will improve Outcome: Progressing Goal: Understanding of medication regimen will improve Outcome: Progressing Goal: Individualized Educational Video(s) Outcome: Progressing   Problem: Activity: Goal: Ability to tolerate increased activity will improve Outcome: Progressing   Problem: Cardiac: Goal: Ability to achieve and maintain adequate cardiopulmonary perfusion will improve Outcome: Progressing   Problem: Health Behavior/Discharge Planning: Goal: Ability to safely manage health-related needs after discharge will improve Outcome: Progressing

## 2023-03-18 NOTE — Assessment & Plan Note (Signed)
Attribute to multifactorial etiology possibly varying from blood pressure and also ambulatory dysfunction and falls from aging and deconditioning.  Physical therapy evaluation prior to discharge and possible placement.

## 2023-03-18 NOTE — Assessment & Plan Note (Signed)
Unclear if this is Malignancy or aspiration pneumonia.  We will get procalcitonin and start rocephin.  Follow culture and ct chest .

## 2023-03-18 NOTE — Assessment & Plan Note (Signed)
Pt is not on diabetic meds and we will check glycemic protocol.  Diet one pt has swallow.  Until then d5ns at 40 .

## 2023-03-18 NOTE — Assessment & Plan Note (Signed)
Will cont pt on heparin single dose no anticoagulation.  CHA2DS2/VAS Stroke Risk Points  Current as of 6 minutes ago     7 >= 2 Points: High Risk  1 to 1.99 Points: Medium Risk  0 Points: Low Risk    Last Change:       Details    This score determines the patient's risk of having a stroke if the  patient has atrial fibrillation.       Points Metrics  1 Has Congestive Heart Failure:  Yes    Current as of 6 minutes ago  0 Has Vascular Disease:  No    Current as of 6 minutes ago  1 Has Hypertension:  Yes    Current as of 6 minutes ago  2 Age:  89    Current as of 6 minutes ago  1 Has Diabetes:  Yes    Current as of 6 minutes ago  2 Had Stroke:  No  Had TIA:  No  Had Thromboembolism:  Yes    Current as of 6 minutes ago  0 Male:  No    Current as of 6 minutes ago

## 2023-03-18 NOTE — Assessment & Plan Note (Signed)
Lab Results  Component Value Date   CREATININE 1.63 (H) 03/05/2023   CREATININE 1.70 (H) 01/21/2023   CREATININE 1.87 (H) 12/22/2022  Stable, renally dose medications, avoid contrast studies.

## 2023-03-18 NOTE — ED Notes (Signed)
Writer rn @ bs for repositioning pt in c collar pt remains oriented although drowsy 18g piv to rac remains cdi not infiltrated infusing D5 / .9 NS 75ml/hr per order room 12 monitor is not sending O2 results to chart - pt O2 sats decrease to 83% while in hallway writer RN request to move pt to room prior to admission to monitor VS pt continues to desat [place] cannula initially applied with 3L O2 pt O2 sats appear initially to increase to 92% pt noted to have significant consistent apneic events when resting per family pt suffers from sleep apnea pt and copd however pt o2 noted to decrease with [place] 3l requiring assistance via nrb 6-8l for short periods of time the Liters are decreased with pt O2 increase and while pt is awake and apnea is not occurring this cycle continues in room 12 where pt is transferred from hallway until this time pt remains on O2 requiring occasional titration pt lower extremity  bilateral edema concern for possible dvt US previously @ bs for [place] of bilateral lower extremities pt remains drowsy attempts to remove c collar often states "I want to go home" pt requiring c collar for acute cervical fx post fall from standing this date  - pt hx afib with paperwork for DNR/DNI on hand as well as verbally confirming DNR/DNI with Ed physician and son @ bs upon admit to hospitalist  - pt however is placed on cardiac monitoring per admission physician order pt hx prostate CA per admission physician Dr Allena Katz ok to place male pure wick instead of ordered foley cath due to previous painful experiences with foley cath placement ultimately being unsuccessful pt on 2L O2 via mask with  tubing attached continues to flucuate between O2 sat 83-94% depending on wake status

## 2023-03-18 NOTE — Assessment & Plan Note (Signed)
Prn albuterol.  Cont with advair.

## 2023-03-18 NOTE — ED Notes (Signed)
Verbal report provided to 2A RN pt and family updated on room assignment as double room pt LOC remains unchanged no bleeding noted from posterior head laceration c collar remains in place

## 2023-03-18 NOTE — Progress Notes (Signed)
1      PROGRESS NOTE    Eric Hicks  HQI:[id number] DOB: Dec 18, 1926 DOA: 03/22/2023 PCP: Sherlene Shams, MD    Brief Narrative:   89 y.o. male with h/o CKD-3a, HTN, HLD, COPD, dCHF, A fib not on AC, GIB, prostate cancer, BPH, anemia admitted s/p fall and found to have C2/C1 combination fracture and also fracture through the medial wall of the right acetabulum.   11/22: Neurosurgery and Ortho c/s   Assessment & Plan:   Principal Problem:   Fall at home, initial encounter Active Problems:   History of pulmonary embolism   Closed right hip fracture (HCC)   CKD (chronic kidney disease) stage 4, GFR 15-29 ml/min (HCC)   COPD (chronic obstructive pulmonary disease) with emphysema (HCC)   Chronic diastolic CHF (congestive heart failure) (HCC)   HTN (hypertension)   Paroxysmal atrial fibrillation (HCC)   Lung cancer, upper lobe (HCC)   Controlled type 2 diabetes mellitus with stage 3 chronic kidney disease, without long-term current use of insulin (HCC)   Recurrent falls  Acute type II odontoid fracture as well as a C1 fracture. S/p fall - Neurosurgery seen - conservative mgmt planned per d/w family by neurosurgery - cervical brace for now  - outpt neurosurgery f/up   Fracture through the medial wall of the right acetabulum.  Ortho c/s - Dr Robbie Lis aware - likely conservative mgmt  CKD 3b Stable and at baseline  COPD (chronic obstructive pulmonary disease) with emphysema (HCC) Prn albuterol.  Cont with advair.    Chronic diastolic CHF (congestive heart failure) (HCC) Stable, last echo was last year ;  Left ventricular ejection fraction, by estimation, is 55 to 60% .      HTN (hypertension) Paroxysmal atrial fibrillation (HCC) BP & HR under control.  Recurrent falls Multifactorial, PT, OT eval   Controlled type 2 diabetes mellitus with stage 3 chronic kidney disease, without long-term current use of insulin (HCC) SSI for now.    H/o Lung cancer, upper lobe  (HCC) Mass seen on CT, outpt f/up with Onco Empiric Tx with Rocephin & Zithromax for pna   DVT prophylaxis: SCD SCDs Start: 03/18/23 0034     Code Status: DNR Family Communication: (NO "discussed with patient"). Called family but no one picked up Disposition Plan: (possible D/C in next 1-2 days   Consultants:  Neurosurgery Ortho    Antimicrobials:  Rocephin Zithromax    Subjective:  Denies any pain. Hoping to go home instead of SNF  Objective: Vitals:   03/18/23 0654 03/18/23 0849 03/18/23 1252 03/18/23 1643  BP: 122/85 (!) 153/99 (!) 128/90 (!) 140/81  Pulse: 96 77 80 78  Resp: 19   18  Temp: 98.6 F (37 C)     TempSrc:      SpO2: 91% (!) 88% 94% 90%  Weight:      Height:        Intake/Output Summary (Last 24 hours) at 03/18/2023 1833 Last data filed at 03/18/2023 1544 Gross per 24 hour  Intake 510 ml  Output --  Net 510 ml   Filed Weights   03/23/2023 1920  Weight: 72.6 kg    Examination:  General exam: Appears calm and comfortable, c-collar in place Respiratory system: Clear to auscultation. Respiratory effort normal. Cardiovascular system: S1 & S2 heard, RRR. No JVD, murmurs, rubs, gallops or clicks. No pedal edema. Gastrointestinal system: Abdomen is nondistended, soft and nontender. No organomegaly or masses felt. Normal bowel sounds heard. Central nervous  system: Alert and oriented. No focal neurological deficits. Extremities: Symmetric 5 x 5 power. Skin: No rashes, lesions or ulcers Psychiatry: Judgement and insight appear normal. Mood & affect appropriate.     Data Reviewed: I have personally reviewed following labs and imaging studies  CBC: Recent Labs  Lab 03/15/2023 2036 03/18/23 0458  WBC 11.2* 15.1*  NEUTROABS 9.6* 13.8*  HGB 12.1* 12.5*  HCT 37.6* 38.5*  MCV 100.5* 101.0*  PLT 171 162   Basic Metabolic Panel: Recent Labs  Lab 03/13/2023 2036 03/18/23 0458  NA 144 144  K 4.1 4.0  CL 111 110  CO2 23 23  GLUCOSE 125*  160*  BUN 40* 44*  CREATININE 1.63* 1.50*  CALCIUM 8.8* 8.8*  MG  --  2.1   GFR: Estimated Creatinine Clearance: 30.9 mL/min (A) (by C-G formula based on SCr of 1.5 mg/dL (H)). Liver Function Tests: Recent Labs  Lab 03/12/2023 2036 03/18/23 0458  AST 47* 36  ALT 46* 43  ALKPHOS 90 72  BILITOT 1.1 0.9  PROT 5.7* 6.3*  ALBUMIN 3.3* 3.4*   No results for input(s): "LIPASE", "AMYLASE" in the last 168 hours. No results for input(s): "AMMONIA" in the last 168 hours. Coagulation Profile: No results for input(s): "INR", "PROTIME" in the last 168 hours. Cardiac Enzymes: No results for input(s): "CKTOTAL", "CKMB", "CKMBINDEX", "TROPONINI" in the last 168 hours. BNP (last 3 results) No results for input(s): "PROBNP" in the last 8760 hours. HbA1C: No results for input(s): "HGBA1C" in the last 72 hours. CBG: Recent Labs  Lab 03/18/23 0226 03/18/23 0442 03/18/23 0851 03/18/23 1253  GLUCAP 130* 199* 102* 190*   Lipid Profile: No results for input(s): "CHOL", "HDL", "LDLCALC", "TRIG", "CHOLHDL", "LDLDIRECT" in the last 72 hours. Thyroid Function Tests: No results for input(s): "TSH", "T4TOTAL", "FREET4", "T3FREE", "THYROIDAB" in the last 72 hours. Anemia Panel: No results for input(s): "VITAMINB12", "FOLATE", "FERRITIN", "TIBC", "IRON", "RETICCTPCT" in the last 72 hours. Sepsis Labs: Recent Labs  Lab 03/05/2023 2302 03/18/23 0148 03/18/23 0248  PROCALCITON <0.10  --   --   LATICACIDVEN  --  1.0 0.9    Recent Results (from the past 240 hour(s))  Culture, blood (Routine X 2) w Reflex to ID Panel     Status: None (Preliminary result)   Collection Time: 03/18/23  1:20 AM   Specimen: BLOOD  Result Value Ref Range Status   Specimen Description BLOOD LEFT ANTECUBITAL  Final   Special Requests   Final    BOTTLES DRAWN AEROBIC AND ANAEROBIC Blood Culture adequate volume   Culture   Final    NO GROWTH <12 HOURS Performed at [facility], [address] [place],  [place] [address]    Report Status PENDING  Incomplete  Culture, blood (Routine X 2) w Reflex to ID Panel     Status: None (Preliminary result)   Collection Time: 03/18/23  1:48 AM   Specimen: BLOOD  Result Value Ref Range Status   Specimen Description BLOOD BLOOD RIGHT WRIST  Final   Special Requests   Final    BOTTLES DRAWN AEROBIC AND ANAEROBIC Blood Culture adequate volume   Culture   Final    NO GROWTH <12 HOURS Performed at [facility], [address] [place], [place] [address]    Report Status PENDING  Incomplete         Radiology Studies: US Venous Img Lower Bilateral (DVT)  Result Date: 03/18/2023 CLINICAL DATA:  Leg edema EXAM: BILATERAL LOWER EXTREMITY VENOUS DOPPLER  ULTRASOUND TECHNIQUE: Gray-scale sonography with graded compression, as well as color Doppler and duplex ultrasound were performed to evaluate the lower extremity deep venous systems from the level of the common femoral vein and including the common femoral, femoral, profunda femoral, popliteal and calf veins including the posterior tibial, peroneal and gastrocnemius veins when visible. The superficial great saphenous vein was also interrogated. Spectral Doppler was utilized to evaluate flow at rest and with distal augmentation maneuvers in the common femoral, femoral and popliteal veins. COMPARISON:  None Available. FINDINGS: RIGHT LOWER EXTREMITY Common Femoral Vein: No evidence of thrombus. Normal compressibility, respiratory phasicity and response to augmentation. Saphenofemoral Junction: No evidence of thrombus. Normal compressibility and flow on color Doppler imaging. Profunda Femoral Vein: No evidence of thrombus. Normal compressibility and flow on color Doppler imaging. Femoral Vein: No evidence of thrombus. Normal compressibility, respiratory phasicity and response to augmentation. Popliteal Vein: No evidence of thrombus. Normal compressibility, respiratory phasicity and response to augmentation. Calf  Veins: No evidence of thrombus. Normal compressibility and flow on color Doppler imaging. Superficial Great Saphenous Vein: No evidence of thrombus. Normal compressibility. Venous Reflux:  None. Other Findings:  None. LEFT LOWER EXTREMITY Common Femoral Vein: No evidence of thrombus. Normal compressibility, respiratory phasicity and response to augmentation. Saphenofemoral Junction: No evidence of thrombus. Normal compressibility and flow on color Doppler imaging. Profunda Femoral Vein: No evidence of thrombus. Normal compressibility and flow on color Doppler imaging. Femoral Vein: No evidence of thrombus. Normal compressibility, respiratory phasicity and response to augmentation. Popliteal Vein: No evidence of thrombus. Normal compressibility, respiratory phasicity and response to augmentation. Calf Veins: Nonocclusive thrombus seen in the posterior tibial veins of the left calf. Superficial Great Saphenous Vein: No evidence of thrombus. Normal compressibility. Venous Reflux:  None. Other Findings:  None. IMPRESSION: No evidence of right lower extremity DVT. Nonocclusive clot within the posterior tibial veins of the left calf. Electronically Signed   By: Charlett Nose M.D.   On: 03/18/2023 02:18   CT CHEST WO CONTRAST  Result Date: 03/18/2023 CLINICAL DATA:  Respiratory failure EXAM: CT CHEST WITHOUT CONTRAST TECHNIQUE: Multidetector CT imaging of the chest was performed following the standard protocol without IV contrast. RADIATION DOSE REDUCTION: This exam was performed according to the departmental dose-optimization program which includes automated exposure control, adjustment of the mA and/or kV according to patient size and/or use of iterative reconstruction technique. COMPARISON:  01/21/2023 FINDINGS: Cardiovascular: Cardiomegaly. Diffuse coronary artery and moderate aortic atherosclerosis. Aneurysmal dilatation of the ascending thoracic aorta measuring 4.6 cm, stable Mediastinum/Nodes: No mediastinal,  hilar, or axillary adenopathy. Layering secretions within the trachea. Thyroid and esophagus unremarkable. Lungs/Pleura: Moderate right pleural effusion and small left pleural effusion. Masslike area in the right upper lobe/apex measures up to 4.3 cm and is similar to prior study. Ground-glass airspace disease in the left upper lobe, new since prior study. Compressive atelectasis in the lower lobes. Nodular areas previously described in the lower lobes and right middle lobe are difficult to visualize due to surrounding airspace disease currently. Upper Abdomen: No acute findings Musculoskeletal: Chest wall soft tissues are unremarkable. No acute bony abnormality. IMPRESSION: Stable right apical masslike density. Moderate right pleural effusion and small left pleural effusion. Compressive atelectasis in the lower lobes. Ground-glass airspace disease in the posterior left upper lobe concerning for pneumonia. Cardiomegaly, diffuse coronary artery disease. 4.6 cm ascending thoracic aortic aneurysm, stable since prior study. Aortic Atherosclerosis (ICD10-I70.0). Electronically Signed   By: Charlett Nose M.D.   On: 03/18/2023 00:27  MR Cervical Spine Wo Contrast  Result Date: 02/27/2023 CLINICAL DATA:  Initial evaluation for acute trauma. EXAM: MRI CERVICAL SPINE WITHOUT CONTRAST TECHNIQUE: Multiplanar, multisequence MR imaging of the cervical spine was performed. No intravenous contrast was administered. COMPARISON:  Prior CT from earlier the same day. FINDINGS: Alignment: Straightening of the normal cervical lordosis. Trace retrolisthesis of C3 on C4, with trace anterolisthesis of C4 on C5, C7 on T1, and T1 on T2. Findings likely chronic and degenerative. Vertebrae: Previously identified oblique type 2 odontoid fracture with minimal distraction again noted, stable from prior CT. Concomitant C1 fractures noted as well, also grossly stable, better seen on prior exam. Associated reactive marrow edema, consistent with  acute fractures. No significant displacement or malalignment. No other acute or recent fracture. Mild chronic height loss present at the superior endplates of T3 and T4. Underlying bone marrow signal intensity within normal limits. No worrisome osseous lesions. Cord: Normal signal and morphology. No evidence for traumatic cord injury. Posterior Fossa, vertebral arteries, paraspinal tissues: Age-related atrophy noted within the visualized brain. Craniocervical junction within normal limits. Mild edema within the upper prevertebral soft tissues related to the adjacent odontoid fracture. The major ligamentous structures appear intact. Normal flow voids seen within the vertebral arteries bilaterally. Disc levels: C1-2: Degenerative osteoarthritic changes about the atlantodental articulation with underlying C1 and C2 fractures. No stenosis. C2-C3: Minimal disc bulge. Moderate left facet hypertrophy. No significant stenosis. C3-C4: Mild disc bulge with uncovertebral spurring. Mild bilateral facet hypertrophy. No spinal stenosis. Mild to moderate left C4 foraminal narrowing. Right neural foramen remains patent. C4-C5: Mild disc bulge with left-sided uncovertebral spurring. Mild left-sided facet hypertrophy. No spinal stenosis. Mild left C5 foraminal narrowing. Right neural foramen remains patent. C5-C6: Degenerate intervertebral disc space narrowing with circumferential disc osteophyte complex. No significant spinal stenosis. Moderate right C6 foraminal narrowing. Left neural foramen remains patent. C6-C7: Degenerate intervertebral disc space narrowing with circumferential disc osteophyte complex. No spinal stenosis. Moderate right C7 foraminal narrowing. Left neural foramen remains patent. C7-T1: Anterolisthesis without significant disc bulge. Moderate right facet hypertrophy. No spinal stenosis. Foramina remain IMPRESSION: 1. Acute type 2 odontoid fracture with minimal distraction, with concomitant C1 fractures. Findings  are stable and better visualized on prior CT. 2. No other acute traumatic injury within the cervical spine. No evidence for traumatic cord injury. Major ligamentous structures intact. 3. Underlying multilevel cervical spondylosis without significant spinal stenosis. Mild to moderate left C4 and C5 foraminal narrowing, with moderate right C6 and C7 foraminal stenosis. Electronically Signed   By: Rise Mu M.D.   On: 03/14/2023 23:51   DG Hip Unilat With Pelvis 2-3 Views Right  Result Date: 02/25/2023 CLINICAL DATA:  Fall, right hip pain EXAM: DG HIP (WITH OR WITHOUT PELVIS) 2-3V RIGHT COMPARISON:  03/24/2022 FINDINGS: Interval right hip replacement. There is a fracture through the medial wall of the acetabulum with mild protrusio. No subluxation or dislocation. IMPRESSION: Right hip replacement. Fracture through the medial wall of the right acetabulum. Electronically Signed   By: Charlett Nose M.D.   On: 03/12/2023 20:31   CT Cervical Spine Wo Contrast  Result Date: 03/25/2023 CLINICAL DATA:  Larey Seat, head trauma EXAM: CT CERVICAL SPINE WITHOUT CONTRAST TECHNIQUE: Multidetector CT imaging of the cervical spine was performed without intravenous contrast. Multiplanar CT image reconstructions were also generated. RADIATION DOSE REDUCTION: This exam was performed according to the departmental dose-optimization program which includes automated exposure control, adjustment of the mA and/or kV according to patient size and/or use of  iterative reconstruction technique. COMPARISON:  03/23/2022 FINDINGS: Alignment: Stable mild right convex scoliosis. Skull base and vertebrae: There is a minimally distracted type 2 odontoid fracture, with 2 mm of separation at the fracture site. Otherwise alignment is anatomic. There is also a nondisplaced fracture through the right lamina at C1, with fracture extending through the right C1 facet. Mild comminution at the fracture site. Alignment is anatomic. No other acute  displaced cervical spine fractures. Soft tissues and spinal canal: No evidence of canal hematoma. No prevertebral soft tissue swelling. Disc levels: There are severe degenerative changes at the C1-C2 interface, right much greater than left. There is diffuse facet hypertrophy and spondylosis, most pronounced at the C5-6 and C6-7 levels. Upper chest: Airway is patent. Continue masslike consolidation within the right upper lobe, incompletely evaluated on this study. Other: Reconstructed images confirm the above findings. IMPRESSION: 1. Type 2 odontoid fracture, with minimal distraction of the fracture site. Otherwise near anatomic alignment. 2. Comminuted right C1 facet fracture and transverse right C1 lamina fracture, with near anatomic alignment. 3. Extensive multilevel cervical degenerative changes as above. 4. Continued masslike consolidation within the right upper lobe, incompletely evaluated on this study. Critical Value/emergent results were called by telephone at the time of interpretation on 03/04/2023 at 8:30 pm to provider [facility] , who verbally acknowledged these results. Electronically Signed   By: Sharlet Salina M.D.   On: 02/28/2023 20:30   CT Head Wo Contrast  Result Date: 03/03/2023 CLINICAL DATA:  Larey Seat today, head trauma EXAM: CT HEAD WITHOUT CONTRAST TECHNIQUE: Contiguous axial images were obtained from the base of the skull through the vertex without intravenous contrast. RADIATION DOSE REDUCTION: This exam was performed according to the departmental dose-optimization program which includes automated exposure control, adjustment of the mA and/or kV according to patient size and/or use of iterative reconstruction technique. COMPARISON:  03/23/2022 FINDINGS: Brain: Diffuse cerebral atrophy, likely age appropriate. No acute infarct or hemorrhage. Lateral ventricles and midline structures are unremarkable. No acute extra-axial fluid collections. No mass effect. Vascular: No hyperdense vessel  or unexpected calcification. Stable atherosclerosis. Skull: Scalp hematoma laceration along the midline parietal convexity. No underlying fracture. The remainder of the calvarium is unremarkable. Sinuses/Orbits: No acute finding. Other: None. IMPRESSION: 1. Scalp hematoma laceration along the midline parietal convexity. No underlying fracture. 2. No acute intracranial process. Electronically Signed   By: Sharlet Salina M.D.   On: 03/02/2023 20:23        Scheduled Meds:  [START ON 03/19/2023] atorvastatin  20 mg Oral QODAY   brimonidine  1 drop Both Eyes TID   dorzolamide-timolol  1 drop Both Eyes BID   feeding supplement  237 mL Oral TID BM   fluticasone furoate-vilanterol  1 puff Inhalation Daily   latanoprost  1 drop Both Eyes QHS   multivitamin with minerals  1 tablet Oral Daily   sodium chloride flush  3 mL Intravenous Q12H   Continuous Infusions:  cefTRIAXone (ROCEPHIN)  IV Stopped (03/18/23 0044)   dextrose 5 % and 0.9 % NaCl 30 mL/hr at 03/18/23 1544     LOS: 0 days    Time spent: 35 mins    Giuliana Handyside Sherryll Burger, MD [facility] Pager [phone]  If 7PM-7AM, please contact night-coverage [email or web address] Password [facility] 03/18/2023, 6:33 PM

## 2023-03-18 NOTE — Assessment & Plan Note (Addendum)
Right hip replacement. Fracture through the medial wall of the right acetabulum. Evaluate with Mri once cervical spine management plan is decided per NS.  I will proceed with xray for now. PRN pain medication.

## 2023-03-18 NOTE — Assessment & Plan Note (Signed)
Home regimen once med rec is available. As needed hydralazine as tp is receiving meds for pain control to avoid hypotension.  Vitals:   03/13/2023 1919 02/28/2023 2326 03/05/2023 2345  BP: (!) 167/104 (!) 164/104 (!) 145/103

## 2023-03-18 NOTE — Assessment & Plan Note (Signed)
Will give patient heparin 5000 tonight subcu single dose. A.m. team to reorder once patient has been seen by neurosurgery. I have also ordered venous Dopplers, CT chest noncontrast.   At bedside patient has been hypoxic intermittently is on 2 L initially I thought this was due to the pain medicine that after chart review patient does have a history of PE and order a VQ scan as well. Patient also has need for anticoagulation with a history of atrial fibrillation however unclear as to why patient is not already on it may have to be discussed with family member for goals of care discussion and further management.

## 2023-03-18 NOTE — Progress Notes (Signed)
Initial Nutrition Assessment  DOCUMENTATION CODES:   Severe malnutrition in context of chronic illness  INTERVENTION:   -MVI with minerals daily -Liberalize diet to regular for widest variety of meal selections -Ensure Enlive po TID, each supplement provides 350 kcal and 20 grams of protein.   NUTRITION DIAGNOSIS:   Severe Malnutrition related to chronic illness (COPD) as evidenced by moderate fat depletion, severe fat depletion, moderate muscle depletion, severe muscle depletion.  GOAL:   Patient will meet greater than or equal to 90% of their needs  MONITOR:   PO intake, Supplement acceptance  REASON FOR ASSESSMENT:   Consult Assessment of nutrition requirement/status  ASSESSMENT:   Pt with h/o CKD-3a, HTN, HLD, COPD, dCHF, A fib not on AC, GIB, prostate cancer, BPH, anemia, who presents coming for fall (pt tried to get up out of wheelchair and fell forward). Neurosurgery to see for odontoid fracture.  Pt has also sustained a right acetabular fracture that is nonoperative.  Pt admitted with closed rt hip fracture and pulmonary embolism.   Reviewed I/O's: +100 ml x 24 hours  Per neurosurgery notes, pt with type II dens fracture, which is likely corticated and chronic. Plan for c-collar and further evaluation.   Spoke with pt at bedside, who reports he is "hanging in there". Pt is hard of hearing, but hears best when spoken closely near his rt ear. Pt reports fair appetite at baseline. He does not follow a structured meal pattern, but generally consumes 3 meals per day. Pt reports that he recently started having kidney trouble and has been making sure get drinks 40 ounces of water and 60 grams of protein daily. Per pt, he drinks a Premier Protein shake and a banana each morning.   Pt does not think he has lost weight. He reports he weighs himself daily to assess for fluctuations. Reviewed wt hx; pt has experienced a 3.8% wt loss over the past 3 months, which is not  significant for time frame.   Discussed importance of good meal and supplement intake to promote healing. Pt amenable to continue supplements- discussed importance of having supplement that provide adequate calories and protein while hospitalized secondary to increased nutritional needs. Will also liberalize diet for wider variety of meal selections. Given pt's advanced age and malnutrition, no need for restricted diet.   Medications reviewed and include rocephin and dextrose 5%-0.9% sodium chloride infusion @ 30 ml/hr.   Lab Results  Component Value Date   HGBA1C 6.3 11/17/2022   PTA DM medications are none.   Labs reviewed: CBGS: 130-199 (inpatient orders for glycemic control are none).    NUTRITION - FOCUSED PHYSICAL EXAM:  Flowsheet Row Most Recent Value  Orbital Region Moderate depletion  Upper Arm Region Severe depletion  Thoracic and Lumbar Region Severe depletion  Buccal Region Moderate depletion  Temple Region Severe depletion  Clavicle Bone Region Severe depletion  Clavicle and Acromion Bone Region Severe depletion  Scapular Bone Region Severe depletion  Dorsal Hand Severe depletion  Patellar Region Severe depletion  Anterior Thigh Region Severe depletion  Posterior Calf Region Severe depletion  Edema (RD Assessment) None  Hair Reviewed  Eyes Reviewed  Mouth Reviewed  Skin Reviewed  Nails Reviewed       Diet Order:   Diet Order             Diet Carb Modified Fluid consistency: Thin; Room service appropriate? Yes  Diet effective now  EDUCATION NEEDS:   Education needs have been addressed  Skin:  Skin Assessment: Reviewed RN Assessment  Last BM:  Unknown  Height:   Ht Readings from Last 1 Encounters:  03/18/23 5\' 10"  (1.778 m)    Weight:   Wt Readings from Last 1 Encounters:  03/26/2023 72.6 kg    Ideal Body Weight:  75.5 kg  BMI:  Body mass index is 22.96 kg/m.  Estimated Nutritional Needs:   Kcal:   1950-2150  Protein:  105-120 grams  Fluid:  > 1.9 L    Levada Schilling, RD, LDN, CDCES Registered Dietitian III Certified Diabetes Care and Education Specialist Please refer to [facility] for RD and/or RD on-call/weekend/after hours pager

## 2023-03-18 NOTE — ED Notes (Signed)
Writer RN attempt to call ccmd for cardiac monitoring and is transferred x3 placed on hold x3 unable to remain on hold as pt care is required by Presenter, broadcasting bs

## 2023-03-18 NOTE — TOC Initial Note (Signed)
Transition of Care [facility]) - Initial/Assessment Note    Patient Details  Name: Eric Hicks MRN: [id number] Date of Birth: Jan 22, 1927  Transition of Care [facility]) CM/SW Contact:    Truddie Hidden, RN Phone Number: 03/18/2023, 3:39 PM  Clinical Narrative:                 Attempt to complete RA at bedside. Patient was eating.          Patient Goals and CMS Choice            Expected Discharge Plan and Services                                              Prior Living Arrangements/Services                       Activities of Daily Living   ADL Screening (condition at time of admission) Independently performs ADLs?: No Does the patient have a NEW difficulty with bathing/dressing/toileting/self-feeding that is expected to last >3 days?: Yes (Initiates electronic notice to provider for possible OT consult) Does the patient have a NEW difficulty with getting in/out of bed, walking, or climbing stairs that is expected to last >3 days?: Yes (Initiates electronic notice to provider for possible PT consult) Does the patient have a NEW difficulty with communication that is expected to last >3 days?: No Is the patient deaf or have difficulty hearing?: No Does the patient have difficulty seeing, even when wearing glasses/contacts?: Yes Does the patient have difficulty concentrating, remembering, or making decisions?: No  Permission Sought/Granted                  Emotional Assessment              Admission diagnosis:  Closed nondisplaced fracture of medial wall of right acetabulum, initial encounter (HCC) [S32.474A] Fall, initial encounter [W19.XXXA] Fall at home, initial encounter [phone], Y92.009] Closed odontoid fracture with type II morphology and anterior displacement, initial encounter (HCC) [S12.110A] Patient Active Problem List   Diagnosis Date Noted   Fall at home, initial encounter 03/18/2023   Cystitis 12/24/2022   Recurrent falls  11/19/2022   Acute renal failure superimposed on stage 3a chronic kidney disease (HCC) 11/18/2022   Chronic diastolic CHF (congestive heart failure) (HCC) 11/18/2022   HTN (hypertension) 11/18/2022   Hypokalemia 11/18/2022   Controlled type 2 diabetes mellitus with stage 3 chronic kidney disease, without long-term current use of insulin (HCC) 09/24/2022   History of GI bleed 08/18/2022   Acute blood loss as cause of postoperative anemia 08/18/2022   History of pulmonary embolism 03/26/2022   Closed right hip fracture (HCC) 03/23/2022   CKD (chronic kidney disease) stage 4, GFR 15-29 ml/min (HCC) 03/23/2022   Hyperlipidemia 03/23/2022   B12 deficiency 09/06/2021   Counseling regarding end of life decision making 03/04/2021   Lung cancer, upper lobe (HCC) 09/01/2019   Thoracic ascending aortic aneurysm (HCC) 09/01/2019   Nonischemic dilated cardiomyopathy (HCC) 07/08/2018   Paroxysmal atrial fibrillation (HCC) 07/08/2018   Aortic arch atherosclerosis (HCC) 07/08/2018   Low back pain 12/03/2017   OA (osteoarthritis) of neck 09/29/2017   Prostate cancer (HCC) 03/30/2016   S/p bilateral myringotomy with tube placement 03/30/2016   Hearing aid worn 03/30/2016   Chronic hip pain 04/13/2014   White coat syndrome with diagnosis  of hypertension 11/12/2012   Medicare annual wellness visit, subsequent 11/10/2012   Osteoporosis 01/03/2012   History of adenomatous polyp of colon 11/14/2011   COPD (chronic obstructive pulmonary disease) with emphysema (HCC) 11/14/2011   BPH (benign prostatic hyperplasia)    PCP:  Sherlene Shams, MD Pharmacy:   [facility] [phone] - [place], [place] - [address] [place] [address] Phone: [phone] Fax: [phone]     Social Determinants of Health (SDOH) Social History: SDOH Screenings   Food Insecurity: No Food Insecurity (03/18/2023)  Housing: Low Risk  (03/18/2023)  Transportation Needs: No Transportation Needs (03/18/2023)   Utilities: Not At Risk (03/18/2023)  Alcohol Screen: Low Risk  (10/11/2022)  Depression (PHQ2-9): Low Risk  (12/22/2022)  Recent Concern: Depression (PHQ2-9) - Medium Risk (11/17/2022)  Financial Resource Strain: Low Risk  (10/11/2022)  Physical Activity: Insufficiently Active (10/11/2022)  Social Connections: Socially Integrated (10/11/2022)  Recent Concern: Social Connections - Moderately Isolated (08/15/2022)  Stress: No Stress Concern Present (10/11/2022)  Tobacco Use: Medium Risk (03/18/2023)   SDOH Interventions:     Readmission Risk Interventions    11/20/2022    1:37 PM  Readmission Risk Prevention Plan  Transportation Screening Complete  PCP or Specialist Appt within 3-5 Days Complete  HRI or Home Care Consult Complete  Social Work Consult for Recovery Care Planning/Counseling Complete  Palliative Care Screening Not Applicable  Medication Review Oceanographer) Complete

## 2023-03-18 NOTE — Progress Notes (Signed)
Pt is received to Progressive Unit. A/ox3 and vitals are stable. Pt remains drowsy but awakens easily. No pain is verbally endorsed by the patient. Pt is in bed at this time in c-collar protocol ongoing. Bed is in the lowest position, call light is within reach.

## 2023-03-19 DIAGNOSIS — W19XXXA Unspecified fall, initial encounter: Secondary | ICD-10-CM

## 2023-03-19 DIAGNOSIS — Y92009 Unspecified place in unspecified non-institutional (private) residence as the place of occurrence of the external cause: Secondary | ICD-10-CM

## 2023-03-19 DIAGNOSIS — E43 Unspecified severe protein-calorie malnutrition: Secondary | ICD-10-CM

## 2023-03-19 DIAGNOSIS — N184 Chronic kidney disease, stage 4 (severe): Secondary | ICD-10-CM | POA: Diagnosis not present

## 2023-03-19 DIAGNOSIS — S72001S Fracture of unspecified part of neck of right femur, sequela: Secondary | ICD-10-CM | POA: Diagnosis not present

## 2023-03-19 DIAGNOSIS — Z86711 Personal history of pulmonary embolism: Secondary | ICD-10-CM | POA: Diagnosis not present

## 2023-03-19 LAB — BASIC METABOLIC PANEL
Anion gap: 8 (ref 5–15)
BUN: 50 mg/dL — ABNORMAL HIGH (ref 8–23)
CO2: 23 mmol/L (ref 22–32)
Calcium: 8.7 mg/dL — ABNORMAL LOW (ref 8.9–10.3)
Chloride: 111 mmol/L (ref 98–111)
Creatinine, Ser: 1.77 mg/dL — ABNORMAL HIGH (ref 0.61–1.24)
GFR, Estimated: 35 mL/min — ABNORMAL LOW (ref >60)
Glucose, Bld: 148 mg/dL — ABNORMAL HIGH (ref 70–99)
Potassium: 4.3 mmol/L (ref 3.5–5.1)
Sodium: 142 mmol/L (ref 135–145)

## 2023-03-19 LAB — GLUCOSE, CAPILLARY
Glucose-Capillary: 114 mg/dL — ABNORMAL HIGH (ref 70–99)
Glucose-Capillary: 130 mg/dL — ABNORMAL HIGH (ref 70–99)
Glucose-Capillary: 137 mg/dL — ABNORMAL HIGH (ref 70–99)
Glucose-Capillary: 158 mg/dL — ABNORMAL HIGH (ref 70–99)
Glucose-Capillary: 163 mg/dL — ABNORMAL HIGH (ref 70–99)

## 2023-03-19 LAB — CBC
HCT: 38.4 % — ABNORMAL LOW (ref 39.0–52.0)
Hemoglobin: 12.7 g/dL — ABNORMAL LOW (ref 13.0–17.0)
MCH: 32.6 pg (ref 26.0–34.0)
MCHC: 33.1 g/dL (ref 30.0–36.0)
MCV: 98.7 fL (ref 80.0–100.0)
Platelets: 154 10*3/uL (ref 150–400)
RBC: 3.89 MIL/uL — ABNORMAL LOW (ref 4.22–5.81)
RDW: 14.6 % (ref 11.5–15.5)
WBC: 15.5 10*3/uL — ABNORMAL HIGH (ref 4.0–10.5)
nRBC: 0 % (ref 0.0–0.2)

## 2023-03-19 MED ORDER — DEXTROSE 5 % IV SOLN
500.0000 mg | INTRAVENOUS | Status: DC
  Administered 2023-03-19: 500 mg via INTRAVENOUS
  Filled 2023-03-19: qty 510

## 2023-03-19 MED ORDER — TRAZODONE HCL 50 MG PO TABS
25.0000 mg | ORAL_TABLET | Freq: Once | ORAL | Status: AC
  Administered 2023-03-19: 25 mg via ORAL
  Filled 2023-03-19: qty 1

## 2023-03-19 MED ORDER — TRAMADOL HCL 50 MG PO TABS: 50.0000 mg | ORAL_TABLET | Freq: Four times a day (QID) | ORAL | Status: DC | PRN

## 2023-03-19 NOTE — Evaluation (Addendum)
Occupational Therapy Evaluation Patient Details Name: Eric Hicks MRN: [id number] DOB: 18-Dec-1926 Today's Date: 03/19/2023   History of Present Illness Per chart review: Eric Hicks is a 89 y.o. male with h/o CKD-3a, HTN, HLD, COPD, dCHF, A fib not on AC, GIB, prostate cancer, BPH, anemia, who presents coming for fall while he Tried to get up out of wheelchair and fell forward and NS is planning to see for odontoid fracture.  Pt has also sustained a right acetabular fracture that is nonoperative.   Patient also has a small laceration on the scalp and his right hip is short and has pain patient also has had hip surgery in the past in the same hip.   Clinical Impression   Pt seen this date for OT evaluation, co eval with PT secondary to TTWB status and need for +2.  Pt with miami J collar and has a weight bearing status of TTWB, right acetabular fracture is nonoperative. Pt presents with muscle weakness, decreased transfers, mobility, balance and decreased ability to perform basic self care tasks.  He would benefit from skilled OT services to maximize safety and independence with necessary daily tasks.   Pt has a history of multiple falls in recent months.       If plan is discharge home, recommend the following: A lot of help with walking and/or transfers;A lot of help with bathing/dressing/bathroom;Assistance with cooking/housework;Assist for transportation    Functional Status Assessment  Patient has had a recent decline in their functional status and demonstrates the ability to make significant improvements in function in a reasonable and predictable amount of time.  Equipment Recommendations       Recommendations for Other Services       Precautions / Restrictions Precautions Precautions: Cervical;Fall Precaution Comments: has a miami j collar Required Braces or Orthoses: Cervical Brace Restrictions Weight Bearing Restrictions: Yes RLE Weight Bearing: Touchdown weight bearing       Mobility Bed Mobility Overal bed mobility: Needs Assistance Bed Mobility: Rolling Rolling: Mod assist         General bed mobility comments: Mod assist for rolling    Transfers   Equipment used: Rolling walker (2 wheels)               General transfer comment: Occasional min assist for sitting edge of bed, min assist of 2 for sit to stand and stand to sit.  Increased difficulty with TTWB in standing      Balance Overall balance assessment: Needs assistance   Sitting balance-Leahy Scale: Fair       Standing balance-Leahy Scale: Poor                             ADL either performed or assessed with clinical judgement   ADL Overall ADL's : Needs assistance/impaired Eating/Feeding: Set up Eating/Feeding Details (indicate cue type and reason): Prefers a straw when drinking from a cup due to cervical brace Grooming: Set up;Minimal assistance   Upper Body Bathing: Minimal assistance   Lower Body Bathing: Maximal assistance   Upper Body Dressing : Minimal assistance   Lower Body Dressing: Maximal assistance     Toilet Transfer Details (indicate cue type and reason): Anticipate min assist of 2 with use of a RW and heavy cueing for TTWB status.         Functional mobility during ADLs: +2 for physical assistance;Minimal assistance General ADL Comments: Pt able to stand for 2 trials  this date with min assist of 2, heavy cueing for TTWB status and demonstrates difficulty with adherence.  He will require increased assistance with toileting, transfers, bathing and dressing skills for lower extremity.     Vision Baseline Vision/History: 1 Wears glasses       Perception         Praxis         Pertinent Vitals/Pain Pain Assessment Pain Assessment: 0-10 Pain Score: 6  Pain Location: Reports pain in right LE, initially a burning sensation down the leg upon standing, upon 2nd round of standing he denied the burning sensation. Pain Descriptors  / Indicators: Aching, Burning, Tender Pain Intervention(s): Limited activity within patient's tolerance, Monitored during session, Repositioned     Extremity/Trunk Assessment Upper Extremity Assessment Upper Extremity Assessment: Generalized weakness   Lower Extremity Assessment Lower Extremity Assessment: Defer to PT evaluation       Communication Communication Communication: Other (comment) (HOH) Cueing Techniques: Verbal cues;Tactile cues;Visual cues   Cognition Arousal: Alert Behavior During Therapy: WFL for tasks assessed/performed Overall Cognitive Status: Within Functional Limits for tasks assessed                                 General Comments: Pt is hard of hearing, therefore many commands were repeated during session.  He was able to demonstrate understanding of the need for short term rehab prior to returning home and was in agreement.     General Comments       Exercises     Shoulder Instructions      Home Living Family/patient expects to be discharged to:: Skilled nursing facility Living Arrangements: Children                               Additional Comments: history of multiple falls in recent months.      Prior Functioning/Environment Prior Level of Function : Independent/Modified Independent;History of Falls (last six months)               ADLs Comments: Pt reports he is typically independent with basic self care tasks, light homemaking and meal prep.  He requires assist for heavier household chores and maintenance for the home.        OT Problem List: Decreased strength;Decreased activity tolerance;Decreased knowledge of use of DME or AE;Impaired balance (sitting and/or standing);Pain      OT Treatment/Interventions: Self-care/ADL training;Balance training;Therapeutic exercise;DME and/or AE instruction;Therapeutic activities;Patient/family education    OT Goals(Current goals can be found in the care plan  section) Acute Rehab OT Goals Patient Stated Goal: Pt would like to return home and be independent as possible, agrees he will need STR OT Goal Formulation: With patient/family Time For Goal Achievement: 04/02/23 Potential to Achieve Goals: Good ADL Goals Pt Will Perform Lower Body Bathing: (P) with min assist Pt Will Perform Lower Body Dressing: (P) with min assist Pt Will Transfer to Toilet: (P) with min assist  OT Frequency: Min 2X/week    Co-evaluation PT/OT/SLP Co-Evaluation/Treatment: Yes Reason for Co-Treatment: Complexity of the patient's impairments (multi-system involvement);To address functional/ADL transfers          AM-PAC OT "6 Clicks" Daily Activity     Outcome Measure Help from another person eating meals?: A Little Help from another person taking care of personal grooming?: A Little Help from another person toileting, which includes using toliet, bedpan, or urinal?: A  Lot Help from another person bathing (including washing, rinsing, drying)?: A Lot Help from another person to put on and taking off regular upper body clothing?: A Lot Help from another person to put on and taking off regular lower body clothing?: Total 6 Click Score: 13   End of Session Equipment Utilized During Treatment: Gait belt;Rolling walker (2 wheels);Cervical collar  Activity Tolerance: Patient tolerated treatment well;Patient limited by pain Patient left: in bed;with call bell/phone within reach;with family/visitor present  OT Visit Diagnosis: Unsteadiness on feet (R26.81);Repeated falls (R29.6);Muscle weakness (generalized) (M62.81);History of falling (Z91.81);Pain Pain - Right/Left: Right Pain - part of body: Leg                Time: 0981-1914 OT Time Calculation (min): 40 min Charges:  OT General Charges $OT Visit: 1 Visit OT Evaluation $OT Eval Moderate Complexity: 1 Mod  Darletta Noblett T Codi Folkerts, OTR/L, CLT Macaiah Mangal 03/19/2023, 3:18 PM

## 2023-03-19 NOTE — Progress Notes (Signed)
PT Cancellation Note  Patient Details Name: Eric Hicks MRN: [id number] DOB: 10/03/1926   Cancelled Treatment:    Reason Eval/Treat Not Completed: Medical issues which prohibited therapy. PT orders received and pt chart reviewed. Per chart review, pt is still pending orthopedic consult for R acetabular fracture. Neurosurgery consult recommending conservative management with c-collar for C1-2 fracture; no specific c-spine precautions noted. Further clarification regarding c-spine precautions requested. Will follow up with PT evaluation once medically cleared for participation.    Vira Blanco, PT, DPT 7:47 AM,03/19/23 Physical Therapist - [facility]

## 2023-03-19 NOTE — Progress Notes (Signed)
1      PROGRESS NOTE    Eric Hicks  WNU:[id number] DOB: [birth year] DOA: 03/23/2023 PCP: Sherlene Shams, MD    Brief Narrative:   89 y.o. male with h/o CKD-3a, HTN, HLD, COPD, dCHF, A fib not on AC, GIB, prostate cancer, BPH, anemia admitted s/p fall and found to have C2/C1 combination fracture and also fracture through the medial wall of the right acetabulum.   11/22: Neurosurgery and Ortho c/s 11/23: Conservative management per neurosurgery and Ortho.  PT and OT eval.  Will likely need SNF placement.  Added tramadol for better pain control   Assessment & Plan:   Principal Problem:   Fall at home, initial encounter Active Problems:   History of pulmonary embolism   Closed right hip fracture (HCC)   CKD (chronic kidney disease) stage 4, GFR 15-29 ml/min (HCC)   COPD (chronic obstructive pulmonary disease) with emphysema (HCC)   Chronic diastolic CHF (congestive heart failure) (HCC)   HTN (hypertension)   Paroxysmal atrial fibrillation (HCC)   Lung cancer, upper lobe (HCC)   Controlled type 2 diabetes mellitus with stage 3 chronic kidney disease, without long-term current use of insulin (HCC)   Recurrent falls   Protein-calorie malnutrition, severe  Acute type II odontoid fracture as well as a C1 fracture. S/p fall - Neurosurgery seen - conservative mgmt planned per d/w family by neurosurgery - cervical brace for now  - outpt neurosurgery f/up   Minimally displaced medial wall fracture right acetabulum with minimal protrusion Ortho c/s - Dr Robbie Lis seen - conservative mgmt -PT and OT eval-progressive ambulation to-touch walking on the right only. Outpatient Ortho follow-up with Dr. Wynonia Sours. Hyacinth Meeker in 2 weeks postdischarge Added tramadol for pain control  CKD 3b Stable and at baseline  COPD (chronic obstructive pulmonary disease) with emphysema (HCC) Prn albuterol.  Cont with advair.    Chronic diastolic CHF (congestive heart failure) (HCC) Stable, last echo  was last year ;  Left ventricular ejection fraction, by estimation, is 55 to 60% .      HTN (hypertension) Paroxysmal atrial fibrillation (HCC) BP & HR under control.  Recurrent falls Multifactorial, PT, OT eval   Controlled type 2 diabetes mellitus with stage 3 chronic kidney disease, without long-term current use of insulin (HCC) SSI for now.    H/o Lung cancer, upper lobe (HCC) Mass seen on CT, outpt f/up with Onco Pneumonia ruled out.  Will stop antibiotics.  He denies any cough shortness of breath.  He has no fever   DVT prophylaxis: SCD SCDs Start: 03/18/23 0034     Code Status: DNR Family Communication: (NO "discussed with patient").  Updated son over phone Disposition Plan: possible D/C in next 2-3 days   Consultants:  Neurosurgery Ortho    Antimicrobials:  Rocephin-now stopped Zithromax-now stopped   Subjective:  Denies any pain.  Willing to work with therapy  Objective: Vitals:   03/18/23 1643 03/19/23 0019 03/19/23 0021 03/19/23 0724  BP: (!) 140/81 (!) 138/107  (!) 140/106  Pulse: 78 81  (!) 41  Resp: 18 18  17   Temp: 97.8 F (36.6 C) 97.8 F (36.6 C)  (!) 97.5 F (36.4 C)  TempSrc: Oral Oral    SpO2: 90% 90% 92% 91%  Weight:      Height:        Intake/Output Summary (Last 24 hours) at 03/19/2023 1159 Last data filed at 03/19/2023 0250 Gross per 24 hour  Intake 665 ml  Output 200 ml  Net 465 ml   Filed Weights   03/02/2023 1920  Weight: 72.6 kg    Examination:  General exam: Appears calm and comfortable, [place] collar on his neck in place Respiratory system: Clear to auscultation. Respiratory effort normal. Cardiovascular system: S1 & S2 heard, RRR. No JVD, murmurs, rubs, gallops or clicks. No pedal edema. Gastrointestinal system: Abdomen is nondistended, soft and nontender. No organomegaly or masses felt. Normal bowel sounds heard. Central nervous system: Alert and oriented. No focal neurological deficits. Extremities: Right leg  is externally rotated slightly compared to left.  No tenderness Skin: No rashes, lesions or ulcers Psychiatry: Judgement and insight appear normal. Mood & affect appropriate.     Data Reviewed: I have personally reviewed following labs and imaging studies  CBC: Recent Labs  Lab 03/08/2023 2036 03/18/23 0458 03/19/23 0308  WBC 11.2* 15.1* 15.5*  NEUTROABS 9.6* 13.8*  --   HGB 12.1* 12.5* 12.7*  HCT 37.6* 38.5* 38.4*  MCV 100.5* 101.0* 98.7  PLT 171 162 154   Basic Metabolic Panel: Recent Labs  Lab 03/24/2023 2036 03/18/23 0458 03/19/23 0308  NA 144 144 142  K 4.1 4.0 4.3  CL 111 110 111  CO2 23 23 23   GLUCOSE 125* 160* 148*  BUN 40* 44* 50*  CREATININE 1.63* 1.50* 1.77*  CALCIUM 8.8* 8.8* 8.7*  MG  --  2.1  --    GFR: Estimated Creatinine Clearance: 26.2 mL/min (A) (by C-G formula based on SCr of 1.77 mg/dL (H)). Liver Function Tests: Recent Labs  Lab 03/23/2023 2036 03/18/23 0458  AST 47* 36  ALT 46* 43  ALKPHOS 90 72  BILITOT 1.1 0.9  PROT 5.7* 6.3*  ALBUMIN 3.3* 3.4*    CBG: Recent Labs  Lab 03/18/23 0851 03/18/23 1253 03/19/23 0040 03/19/23 0726 03/19/23 1138  GLUCAP 102* 190* 137* 114* 158*    Sepsis Labs: Recent Labs  Lab 03/24/2023 2302 03/18/23 0148 03/18/23 0248  PROCALCITON <0.10  --   --   LATICACIDVEN  --  1.0 0.9    Recent Results (from the past 240 hour(s))  Culture, blood (Routine X 2) w Reflex to ID Panel     Status: None (Preliminary result)   Collection Time: 03/18/23  1:20 AM   Specimen: BLOOD  Result Value Ref Range Status   Specimen Description BLOOD LEFT ANTECUBITAL  Final   Special Requests   Final    BOTTLES DRAWN AEROBIC AND ANAEROBIC Blood Culture adequate volume   Culture   Final    NO GROWTH 1 DAY Performed at [facility], [address], [place], [place] [address]    Report Status PENDING  Incomplete  Culture, blood (Routine X 2) w Reflex to ID Panel     Status: None (Preliminary result)   Collection  Time: 03/18/23  1:48 AM   Specimen: BLOOD  Result Value Ref Range Status   Specimen Description BLOOD BLOOD RIGHT WRIST  Final   Special Requests   Final    BOTTLES DRAWN AEROBIC AND ANAEROBIC Blood Culture adequate volume   Culture   Final    NO GROWTH 1 DAY Performed at [facility], [address] [place], [place] [address]    Report Status PENDING  Incomplete         Radiology Studies: US Venous Img Lower Bilateral (DVT)  Result Date: 03/18/2023 CLINICAL DATA:  Leg edema EXAM: BILATERAL LOWER EXTREMITY VENOUS DOPPLER ULTRASOUND TECHNIQUE: Gray-scale sonography with graded compression, as well as color Doppler and  duplex ultrasound were performed to evaluate the lower extremity deep venous systems from the level of the common femoral vein and including the common femoral, femoral, profunda femoral, popliteal and calf veins including the posterior tibial, peroneal and gastrocnemius veins when visible. The superficial great saphenous vein was also interrogated. Spectral Doppler was utilized to evaluate flow at rest and with distal augmentation maneuvers in the common femoral, femoral and popliteal veins. COMPARISON:  None Available. FINDINGS: RIGHT LOWER EXTREMITY Common Femoral Vein: No evidence of thrombus. Normal compressibility, respiratory phasicity and response to augmentation. Saphenofemoral Junction: No evidence of thrombus. Normal compressibility and flow on color Doppler imaging. Profunda Femoral Vein: No evidence of thrombus. Normal compressibility and flow on color Doppler imaging. Femoral Vein: No evidence of thrombus. Normal compressibility, respiratory phasicity and response to augmentation. Popliteal Vein: No evidence of thrombus. Normal compressibility, respiratory phasicity and response to augmentation. Calf Veins: No evidence of thrombus. Normal compressibility and flow on color Doppler imaging. Superficial Great Saphenous Vein: No evidence of thrombus. Normal  compressibility. Venous Reflux:  None. Other Findings:  None. LEFT LOWER EXTREMITY Common Femoral Vein: No evidence of thrombus. Normal compressibility, respiratory phasicity and response to augmentation. Saphenofemoral Junction: No evidence of thrombus. Normal compressibility and flow on color Doppler imaging. Profunda Femoral Vein: No evidence of thrombus. Normal compressibility and flow on color Doppler imaging. Femoral Vein: No evidence of thrombus. Normal compressibility, respiratory phasicity and response to augmentation. Popliteal Vein: No evidence of thrombus. Normal compressibility, respiratory phasicity and response to augmentation. Calf Veins: Nonocclusive thrombus seen in the posterior tibial veins of the left calf. Superficial Great Saphenous Vein: No evidence of thrombus. Normal compressibility. Venous Reflux:  None. Other Findings:  None. IMPRESSION: No evidence of right lower extremity DVT. Nonocclusive clot within the posterior tibial veins of the left calf. Electronically Signed   By: Charlett Nose M.D.   On: 03/18/2023 02:18   CT CHEST WO CONTRAST  Result Date: 03/18/2023 CLINICAL DATA:  Respiratory failure EXAM: CT CHEST WITHOUT CONTRAST TECHNIQUE: Multidetector CT imaging of the chest was performed following the standard protocol without IV contrast. RADIATION DOSE REDUCTION: This exam was performed according to the departmental dose-optimization program which includes automated exposure control, adjustment of the mA and/or kV according to patient size and/or use of iterative reconstruction technique. COMPARISON:  01/21/2023 FINDINGS: Cardiovascular: Cardiomegaly. Diffuse coronary artery and moderate aortic atherosclerosis. Aneurysmal dilatation of the ascending thoracic aorta measuring 4.6 cm, stable Mediastinum/Nodes: No mediastinal, hilar, or axillary adenopathy. Layering secretions within the trachea. Thyroid and esophagus unremarkable. Lungs/Pleura: Moderate right pleural effusion and  small left pleural effusion. Masslike area in the right upper lobe/apex measures up to 4.3 cm and is similar to prior study. Ground-glass airspace disease in the left upper lobe, new since prior study. Compressive atelectasis in the lower lobes. Nodular areas previously described in the lower lobes and right middle lobe are difficult to visualize due to surrounding airspace disease currently. Upper Abdomen: No acute findings Musculoskeletal: Chest wall soft tissues are unremarkable. No acute bony abnormality. IMPRESSION: Stable right apical masslike density. Moderate right pleural effusion and small left pleural effusion. Compressive atelectasis in the lower lobes. Ground-glass airspace disease in the posterior left upper lobe concerning for pneumonia. Cardiomegaly, diffuse coronary artery disease. 4.6 cm ascending thoracic aortic aneurysm, stable since prior study. Aortic Atherosclerosis (ICD10-I70.0). Electronically Signed   By: Charlett Nose M.D.   On: 03/18/2023 00:27   MR Cervical Spine Wo Contrast  Result Date: 03/18/2023 CLINICAL DATA:  Initial  evaluation for acute trauma. EXAM: MRI CERVICAL SPINE WITHOUT CONTRAST TECHNIQUE: Multiplanar, multisequence MR imaging of the cervical spine was performed. No intravenous contrast was administered. COMPARISON:  Prior CT from earlier the same day. FINDINGS: Alignment: Straightening of the normal cervical lordosis. Trace retrolisthesis of C3 on C4, with trace anterolisthesis of C4 on C5, C7 on T1, and T1 on T2. Findings likely chronic and degenerative. Vertebrae: Previously identified oblique type 2 odontoid fracture with minimal distraction again noted, stable from prior CT. Concomitant C1 fractures noted as well, also grossly stable, better seen on prior exam. Associated reactive marrow edema, consistent with acute fractures. No significant displacement or malalignment. No other acute or recent fracture. Mild chronic height loss present at the superior endplates  of T3 and T4. Underlying bone marrow signal intensity within normal limits. No worrisome osseous lesions. Cord: Normal signal and morphology. No evidence for traumatic cord injury. Posterior Fossa, vertebral arteries, paraspinal tissues: Age-related atrophy noted within the visualized brain. Craniocervical junction within normal limits. Mild edema within the upper prevertebral soft tissues related to the adjacent odontoid fracture. The major ligamentous structures appear intact. Normal flow voids seen within the vertebral arteries bilaterally. Disc levels: C1-2: Degenerative osteoarthritic changes about the atlantodental articulation with underlying C1 and C2 fractures. No stenosis. C2-C3: Minimal disc bulge. Moderate left facet hypertrophy. No significant stenosis. C3-C4: Mild disc bulge with uncovertebral spurring. Mild bilateral facet hypertrophy. No spinal stenosis. Mild to moderate left C4 foraminal narrowing. Right neural foramen remains patent. C4-C5: Mild disc bulge with left-sided uncovertebral spurring. Mild left-sided facet hypertrophy. No spinal stenosis. Mild left C5 foraminal narrowing. Right neural foramen remains patent. C5-C6: Degenerate intervertebral disc space narrowing with circumferential disc osteophyte complex. No significant spinal stenosis. Moderate right C6 foraminal narrowing. Left neural foramen remains patent. C6-C7: Degenerate intervertebral disc space narrowing with circumferential disc osteophyte complex. No spinal stenosis. Moderate right C7 foraminal narrowing. Left neural foramen remains patent. C7-T1: Anterolisthesis without significant disc bulge. Moderate right facet hypertrophy. No spinal stenosis. Foramina remain IMPRESSION: 1. Acute type 2 odontoid fracture with minimal distraction, with concomitant C1 fractures. Findings are stable and better visualized on prior CT. 2. No other acute traumatic injury within the cervical spine. No evidence for traumatic cord injury. Major  ligamentous structures intact. 3. Underlying multilevel cervical spondylosis without significant spinal stenosis. Mild to moderate left C4 and C5 foraminal narrowing, with moderate right C6 and C7 foraminal stenosis. Electronically Signed   By: Rise Mu M.D.   On: 02/27/2023 23:51   DG Hip Unilat With Pelvis 2-3 Views Right  Result Date: 03/05/2023 CLINICAL DATA:  Fall, right hip pain EXAM: DG HIP (WITH OR WITHOUT PELVIS) 2-3V RIGHT COMPARISON:  03/24/2022 FINDINGS: Interval right hip replacement. There is a fracture through the medial wall of the acetabulum with mild protrusio. No subluxation or dislocation. IMPRESSION: Right hip replacement. Fracture through the medial wall of the right acetabulum. Electronically Signed   By: Charlett Nose M.D.   On: 03/03/2023 20:31   CT Cervical Spine Wo Contrast  Result Date: 03/12/2023 CLINICAL DATA:  Larey Seat, head trauma EXAM: CT CERVICAL SPINE WITHOUT CONTRAST TECHNIQUE: Multidetector CT imaging of the cervical spine was performed without intravenous contrast. Multiplanar CT image reconstructions were also generated. RADIATION DOSE REDUCTION: This exam was performed according to the departmental dose-optimization program which includes automated exposure control, adjustment of the mA and/or kV according to patient size and/or use of iterative reconstruction technique. COMPARISON:  03/23/2022 FINDINGS: Alignment: Stable mild right convex scoliosis.  Skull base and vertebrae: There is a minimally distracted type 2 odontoid fracture, with 2 mm of separation at the fracture site. Otherwise alignment is anatomic. There is also a nondisplaced fracture through the right lamina at C1, with fracture extending through the right C1 facet. Mild comminution at the fracture site. Alignment is anatomic. No other acute displaced cervical spine fractures. Soft tissues and spinal canal: No evidence of canal hematoma. No prevertebral soft tissue swelling. Disc levels: There  are severe degenerative changes at the C1-C2 interface, right much greater than left. There is diffuse facet hypertrophy and spondylosis, most pronounced at the C5-6 and C6-7 levels. Upper chest: Airway is patent. Continue masslike consolidation within the right upper lobe, incompletely evaluated on this study. Other: Reconstructed images confirm the above findings. IMPRESSION: 1. Type 2 odontoid fracture, with minimal distraction of the fracture site. Otherwise near anatomic alignment. 2. Comminuted right C1 facet fracture and transverse right C1 lamina fracture, with near anatomic alignment. 3. Extensive multilevel cervical degenerative changes as above. 4. Continued masslike consolidation within the right upper lobe, incompletely evaluated on this study. Critical Value/emergent results were called by telephone at the time of interpretation on 03/06/2023 at 8:30 pm to provider [facility] , who verbally acknowledged these results. Electronically Signed   By: Sharlet Salina M.D.   On: 02/28/2023 20:30   CT Head Wo Contrast  Result Date: 03/06/2023 CLINICAL DATA:  Larey Seat today, head trauma EXAM: CT HEAD WITHOUT CONTRAST TECHNIQUE: Contiguous axial images were obtained from the base of the skull through the vertex without intravenous contrast. RADIATION DOSE REDUCTION: This exam was performed according to the departmental dose-optimization program which includes automated exposure control, adjustment of the mA and/or kV according to patient size and/or use of iterative reconstruction technique. COMPARISON:  03/23/2022 FINDINGS: Brain: Diffuse cerebral atrophy, likely age appropriate. No acute infarct or hemorrhage. Lateral ventricles and midline structures are unremarkable. No acute extra-axial fluid collections. No mass effect. Vascular: No hyperdense vessel or unexpected calcification. Stable atherosclerosis. Skull: Scalp hematoma laceration along the midline parietal convexity. No underlying fracture. The  remainder of the calvarium is unremarkable. Sinuses/Orbits: No acute finding. Other: None. IMPRESSION: 1. Scalp hematoma laceration along the midline parietal convexity. No underlying fracture. 2. No acute intracranial process. Electronically Signed   By: Sharlet Salina M.D.   On: 03/22/2023 20:23        Scheduled Meds:  atorvastatin  20 mg Oral QODAY   brimonidine  1 drop Both Eyes TID   dorzolamide-timolol  1 drop Both Eyes BID   feeding supplement  237 mL Oral TID BM   fluticasone furoate-vilanterol  1 puff Inhalation Daily   insulin aspart  0-5 Units Subcutaneous QHS   insulin aspart  0-9 Units Subcutaneous TID WC   latanoprost  1 drop Both Eyes QHS   multivitamin with minerals  1 tablet Oral Daily   sodium chloride flush  3 mL Intravenous Q12H   Continuous Infusions:  azithromycin Stopped (03/19/23 0250)   cefTRIAXone (ROCEPHIN)  IV Stopped (03/19/23 0130)     LOS: 1 day    Time spent: 35 mins    Honor Frison Sherryll Burger, MD [facility] Pager [phone]  If 7PM-7AM, please contact night-coverage [email or web address] Password [facility] 03/19/2023, 11:59 AM

## 2023-03-19 NOTE — Plan of Care (Signed)
  Problem: Education: Goal: Ability to describe self-care measures that may prevent or decrease complications (Diabetes Survival Skills Education) will improve Outcome: Progressing Goal: Individualized Educational Video(s) Outcome: Progressing   Problem: Coping: Goal: Ability to adjust to condition or change in health will improve Outcome: Progressing   Problem: Fluid Volume: Goal: Ability to maintain a balanced intake and output will improve Outcome: Progressing   Problem: Health Behavior/Discharge Planning: Goal: Ability to identify and utilize available resources and services will improve Outcome: Progressing Goal: Ability to manage health-related needs will improve Outcome: Progressing   Problem: Metabolic: Goal: Ability to maintain appropriate glucose levels will improve Outcome: Progressing   Problem: Nutritional: Goal: Maintenance of adequate nutrition will improve Outcome: Progressing Goal: Progress toward achieving an optimal weight will improve Outcome: Progressing   Problem: Skin Integrity: Goal: Risk for impaired skin integrity will decrease Outcome: Progressing   Problem: Tissue Perfusion: Goal: Adequacy of tissue perfusion will improve Outcome: Progressing   Problem: Education: Goal: Knowledge of General Education information will improve Description: Including pain rating scale, medication(s)/side effects and non-pharmacologic comfort measures Outcome: Progressing   Problem: Health Behavior/Discharge Planning: Goal: Ability to manage health-related needs will improve Outcome: Progressing   Problem: Clinical Measurements: Goal: Ability to maintain clinical measurements within normal limits will improve Outcome: Progressing Goal: Will remain free from infection Outcome: Progressing Goal: Diagnostic test results will improve Outcome: Progressing Goal: Respiratory complications will improve Outcome: Progressing Goal: Cardiovascular complication will  be avoided Outcome: Progressing   Problem: Activity: Goal: Risk for activity intolerance will decrease Outcome: Progressing   Problem: Nutrition: Goal: Adequate nutrition will be maintained Outcome: Progressing   Problem: Coping: Goal: Level of anxiety will decrease Outcome: Progressing   Problem: Elimination: Goal: Will not experience complications related to bowel motility Outcome: Progressing Goal: Will not experience complications related to urinary retention Outcome: Progressing   Problem: Pain Management: Goal: General experience of comfort will improve Outcome: Progressing   Problem: Safety: Goal: Ability to remain free from injury will improve Outcome: Progressing   Problem: Skin Integrity: Goal: Risk for impaired skin integrity will decrease Outcome: Progressing   Problem: Education: Goal: Ability to demonstrate management of disease process will improve Outcome: Progressing Goal: Ability to verbalize understanding of medication therapies will improve Outcome: Progressing Goal: Individualized Educational Video(s) Outcome: Progressing   Problem: Activity: Goal: Capacity to carry out activities will improve Outcome: Progressing   Problem: Cardiac: Goal: Ability to achieve and maintain adequate cardiopulmonary perfusion will improve Outcome: Progressing   Problem: Education: Goal: Knowledge of disease or condition will improve Outcome: Progressing Goal: Understanding of medication regimen will improve Outcome: Progressing Goal: Individualized Educational Video(s) Outcome: Progressing   Problem: Activity: Goal: Ability to tolerate increased activity will improve Outcome: Progressing   Problem: Cardiac: Goal: Ability to achieve and maintain adequate cardiopulmonary perfusion will improve Outcome: Progressing   Problem: Health Behavior/Discharge Planning: Goal: Ability to safely manage health-related needs after discharge will improve Outcome:  Progressing

## 2023-03-19 NOTE — Progress Notes (Signed)
TED hose applied and Allevyn placed under [place] J collar as requested by MD.

## 2023-03-19 NOTE — Consult Note (Signed)
ORTHOPAEDIC CONSULTATION  REQUESTING PHYSICIAN: Delfino Lovett, MD  Chief Complaint: Pain right hip  HPI: Eric Hicks is a 89 y.o. male who complains of pain in the right hip after a fall at home 2 nights ago.  Patient got up from his chair and fell.  He had a posterior scalp laceration and neck injury.  He also complained of right hip pain.  Dr. Audelia Acton did a right hip hemiarthroplasty 1 year ago and he had recovered well enough to go home and live independently.  He was brought to the emergency room where exam and x-rays revealed a possible odontoid fracture.  In addition it appears that there is a medial wall fracture of the right acetabulum with minimal displacement.  There has been some erosion of the inner aspect of the acetabulum since pictures a year ago.  He has been admitted for neurosurgical evaluation and stabilization.  He complains of some right hip pain especially when the leg is moved aggressively.  He denies any numbness or tingling in the legs.  His son is present and we discussed his case thoroughly.  Past Medical History:  Diagnosis Date   BPH (benign prostatic hyperplasia)    managed by [facility]   COPD (chronic obstructive pulmonary disease) (HCC)    Elevated PSA, between 10 and less than 20 ng/ml    [facility] Nash, watchful waiting   Hypertension    Osteoporosis    by DEXA   Past Surgical History:  Procedure Laterality Date   HIP ARTHROPLASTY Right 03/24/2022   Procedure: ARTHROPLASTY BIPOLAR HIP (HEMIARTHROPLASTY);  Surgeon: Reinaldo Berber, MD;  Location: [facility] ORS;  Service: Orthopedics;  Laterality: Right;   TYMPANOSTOMY TUBE PLACEMENT  2017   Social History   Socioeconomic History   Marital status: Widowed    Spouse name: Not on file   Number of children: Not on file   Years of education: Not on file   Highest education level: Bachelor's degree (e.g., BA, AB, BS)  Occupational History   Not on file  Tobacco Use   Smoking status: Former   Smokeless tobacco:  Never  Vaping Use   Vaping status: Never Used  Substance and Sexual Activity   Alcohol use: No   Drug use: Not on file   Sexual activity: Never  Other Topics Concern   Not on file  Social History Narrative   Regular exercise-yew   Social Determinants of Health   Financial Resource Strain: Low Risk  (10/11/2022)   Overall Financial Resource Strain (CARDIA)    Difficulty of Paying Living Expenses: Not hard at all  Food Insecurity: No Food Insecurity (03/18/2023)   Hunger Vital Sign    Worried About Running Out of Food in the Last Year: Never true    Ran Out of Food in the Last Year: Never true  Transportation Needs: No Transportation Needs (03/18/2023)   PRAPARE - Administrator, Civil Service (Medical): No    Lack of Transportation (Non-Medical): No  Physical Activity: Insufficiently Active (10/11/2022)   Exercise Vital Sign    Days of Exercise per Week: 7 days    Minutes of Exercise per Session: 20 min  Stress: No Stress Concern Present (10/11/2022)   [employer] of Occupational Health - Occupational Stress Questionnaire    Feeling of Stress : Not at all  Social Connections: Socially Integrated (10/11/2022)   Social Connection and Isolation Panel [NHANES]    Frequency of Communication with Friends and Family: More than three times a week  Frequency of Social Gatherings with Friends and Family: More than three times a week    Attends Religious Services: More than 4 times per year    Active Member of [employer] or Organizations: Yes    Attends Engineer, structural: More than 4 times per year    Marital Status: Married  Recent Concern: Social Connections - Moderately Isolated (08/15/2022)   Social Connection and Isolation Panel [NHANES]    Frequency of Communication with Friends and Family: Three times a week    Frequency of Social Gatherings with Friends and Family: Twice a week    Attends Religious Services: More than 4 times per year    Active Member of  [employer] or Organizations: No    Attends Banker Meetings: Not on file    Marital Status: Widowed   Family History  Problem Relation Age of Onset   Diabetes Mother    Hypertension Father    Cancer Brother 68       multiple myeloma   Allergies  Allergen Reactions   Tylenol [Acetaminophen] Rash   Prior to Admission medications   Medication Sig Start Date End Date Taking? Authorizing Provider  AMBULATORY NON FORMULARY MEDICATION Joint Ad[employer]age Gold 2 tablets daily    [provider]  atorvastatin (LIPITOR) 20 MG tablet TAKE 1 TABLET BY MOUTH EVERY OTHER DAY Patient taking differently: Take 20 mg by mouth every other day. qhs 08/23/22   Sherlene Shams, MD  brimonidine (ALPHAGAN) 0.2 % ophthalmic solution 1 drop 3 (three) times daily.    [provider]  calcium carbonate (OS-CAL) 600 MG TABS Take 600 mg by mouth daily with breakfast.     [provider]  dorzolamide-timolol (COSOPT) 22.3-6.8 MG/ML ophthalmic solution TAKE 1 DROP(S) IN RIGHT EYE 2 TIMES A DAY 07/29/17   [provider]  ferrous sulfate 325 (65 FE) MG tablet Take 1 tablet by mouth daily.    [provider]  fluticasone (FLONASE) 50 MCG/ACT nasal spray Place 1 spray into both nostrils daily.    [provider]  fluticasone-salmeterol (WIXELA INHUB) 100-50 MCG/ACT AEPB Inhale 1 puff into the lungs 2 (two) times daily. 11/17/22   Sherlene Shams, MD  furosemide (LASIX) 20 MG tablet Take 1 tablet (20 mg total) by mouth every other day. 12/24/22   Sherlene Shams, MD  latanoprost (XALATAN) 0.005 % ophthalmic solution Place 1 drop into both eyes at bedtime. 03/21/18   [provider]  leuprolide, 6 Month, (ELIGARD) 45 MG injection Inject 45 mg into the skin every 6 (six) months.    [provider]  mirtazapine (REMERON) 30 MG tablet TAKE 1 TABLET BY MOUTH AT BEDTIME. 02/14/23   Sherlene Shams, MD  Vitamin D, Cholecalciferol, 25 MCG (1000 UT) CAPS Take  1 capsule by mouth daily.    [provider]   US Venous Img Lower Bilateral (DVT)  Result Date: 03/18/2023 CLINICAL DATA:  Leg edema EXAM: BILATERAL LOWER EXTREMITY VENOUS DOPPLER ULTRASOUND TECHNIQUE: Gray-scale sonography with graded compression, as well as color Doppler and duplex ultrasound were performed to evaluate the lower extremity deep venous systems from the level of the common femoral vein and including the common femoral, femoral, profunda femoral, popliteal and calf veins including the posterior tibial, peroneal and gastrocnemius veins when visible. The superficial great saphenous vein was also interrogated. Spectral Doppler was utilized to evaluate flow at rest and with distal augmentation maneuvers in the common femoral, femoral and popliteal veins. COMPARISON:  None Available. FINDINGS: RIGHT LOWER EXTREMITY Common Femoral Vein: No evidence of thrombus. Normal compressibility, respiratory phasicity and response to augmentation. Saphenofemoral Junction: No evidence of thrombus. Normal compressibility and flow on color Doppler imaging. Profunda Femoral Vein: No evidence of thrombus. Normal compressibility and flow on color Doppler imaging. Femoral Vein: No evidence of thrombus. Normal compressibility, respiratory phasicity and response to augmentation. Popliteal Vein: No evidence of thrombus. Normal compressibility, respiratory phasicity and response to augmentation. Calf Veins: No evidence of thrombus. Normal compressibility and flow on color Doppler imaging. Superficial Great Saphenous Vein: No evidence of thrombus. Normal compressibility. Venous Reflux:  None. Other Findings:  None. LEFT LOWER EXTREMITY Common Femoral Vein: No evidence of thrombus. Normal compressibility, respiratory phasicity and response to augmentation. Saphenofemoral Junction: No evidence of thrombus. Normal compressibility and flow on color Doppler imaging. Profunda Femoral Vein: No evidence of thrombus. Normal  compressibility and flow on color Doppler imaging. Femoral Vein: No evidence of thrombus. Normal compressibility, respiratory phasicity and response to augmentation. Popliteal Vein: No evidence of thrombus. Normal compressibility, respiratory phasicity and response to augmentation. Calf Veins: Nonocclusive thrombus seen in the posterior tibial veins of the left calf. Superficial Great Saphenous Vein: No evidence of thrombus. Normal compressibility. Venous Reflux:  None. Other Findings:  None. IMPRESSION: No evidence of right lower extremity DVT. Nonocclusive clot within the posterior tibial veins of the left calf. Electronically Signed   By: Charlett Nose M.D.   On: 03/18/2023 02:18   CT CHEST WO CONTRAST  Result Date: 03/18/2023 CLINICAL DATA:  Respiratory failure EXAM: CT CHEST WITHOUT CONTRAST TECHNIQUE: Multidetector CT imaging of the chest was performed following the standard protocol without IV contrast. RADIATION DOSE REDUCTION: This exam was performed according to the departmental dose-optimization program which includes automated exposure control, adjustment of the mA and/or kV according to patient size and/or use of iterative reconstruction technique. COMPARISON:  01/21/2023 FINDINGS: Cardiovascular: Cardiomegaly. Diffuse coronary artery and moderate aortic atherosclerosis. Aneurysmal dilatation of the ascending thoracic aorta measuring 4.6 cm, stable Mediastinum/Nodes: No mediastinal, hilar, or axillary adenopathy. Layering secretions within the trachea. Thyroid and esophagus unremarkable. Lungs/Pleura: Moderate right pleural effusion and small left pleural effusion. Masslike area in the right upper lobe/apex measures up to 4.3 cm and is similar to prior study. Ground-glass airspace disease in the left upper lobe, new since prior study. Compressive atelectasis in the lower lobes. Nodular areas previously described in the lower lobes and right middle lobe are difficult to visualize due to surrounding  airspace disease currently. Upper Abdomen: No acute findings Musculoskeletal: Chest wall soft tissues are unremarkable. No acute bony abnormality. IMPRESSION: Stable right apical masslike density. Moderate right pleural effusion and small left pleural effusion. Compressive atelectasis in the lower lobes. Ground-glass airspace disease in the posterior left upper lobe concerning for pneumonia. Cardiomegaly, diffuse coronary artery disease. 4.6 cm ascending thoracic aortic aneurysm, stable since prior study. Aortic Atherosclerosis (ICD10-I70.0). Electronically Signed   By: Charlett Nose M.D.   On: 03/18/2023 00:27   MR Cervical Spine Wo Contrast  Result Date: 03/20/2023 CLINICAL DATA:  Initial evaluation for acute trauma. EXAM: MRI CERVICAL SPINE WITHOUT CONTRAST TECHNIQUE: Multiplanar, multisequence MR imaging of the cervical spine was performed. No intravenous contrast was administered. COMPARISON:  Prior CT from earlier the same day. FINDINGS: Alignment: Straightening of the normal cervical lordosis. Trace retrolisthesis of C3 on C4, with trace anterolisthesis of C4 on C5, C7 on T1, and T1 on T2. Findings likely chronic and degenerative. Vertebrae: Previously identified oblique type 2  odontoid fracture with minimal distraction again noted, stable from prior CT. Concomitant C1 fractures noted as well, also grossly stable, better seen on prior exam. Associated reactive marrow edema, consistent with acute fractures. No significant displacement or malalignment. No other acute or recent fracture. Mild chronic height loss present at the superior endplates of T3 and T4. Underlying bone marrow signal intensity within normal limits. No worrisome osseous lesions. Cord: Normal signal and morphology. No evidence for traumatic cord injury. Posterior Fossa, vertebral arteries, paraspinal tissues: Age-related atrophy noted within the visualized brain. Craniocervical junction within normal limits. Mild edema within the upper  prevertebral soft tissues related to the adjacent odontoid fracture. The major ligamentous structures appear intact. Normal flow voids seen within the vertebral arteries bilaterally. Disc levels: C1-2: Degenerative osteoarthritic changes about the atlantodental articulation with underlying C1 and C2 fractures. No stenosis. C2-C3: Minimal disc bulge. Moderate left facet hypertrophy. No significant stenosis. C3-C4: Mild disc bulge with uncovertebral spurring. Mild bilateral facet hypertrophy. No spinal stenosis. Mild to moderate left C4 foraminal narrowing. Right neural foramen remains patent. C4-C5: Mild disc bulge with left-sided uncovertebral spurring. Mild left-sided facet hypertrophy. No spinal stenosis. Mild left C5 foraminal narrowing. Right neural foramen remains patent. C5-C6: Degenerate intervertebral disc space narrowing with circumferential disc osteophyte complex. No significant spinal stenosis. Moderate right C6 foraminal narrowing. Left neural foramen remains patent. C6-C7: Degenerate intervertebral disc space narrowing with circumferential disc osteophyte complex. No spinal stenosis. Moderate right C7 foraminal narrowing. Left neural foramen remains patent. C7-T1: Anterolisthesis without significant disc bulge. Moderate right facet hypertrophy. No spinal stenosis. Foramina remain IMPRESSION: 1. Acute type 2 odontoid fracture with minimal distraction, with concomitant C1 fractures. Findings are stable and better visualized on prior CT. 2. No other acute traumatic injury within the cervical spine. No evidence for traumatic cord injury. Major ligamentous structures intact. 3. Underlying multilevel cervical spondylosis without significant spinal stenosis. Mild to moderate left C4 and C5 foraminal narrowing, with moderate right C6 and C7 foraminal stenosis. Electronically Signed   By: Rise Mu M.D.   On: 03/08/2023 23:51   DG Hip Unilat With Pelvis 2-3 Views Right  Result Date:  03/18/2023 CLINICAL DATA:  Fall, right hip pain EXAM: DG HIP (WITH OR WITHOUT PELVIS) 2-3V RIGHT COMPARISON:  03/24/2022 FINDINGS: Interval right hip replacement. There is a fracture through the medial wall of the acetabulum with mild protrusio. No subluxation or dislocation. IMPRESSION: Right hip replacement. Fracture through the medial wall of the right acetabulum. Electronically Signed   By: Charlett Nose M.D.   On: 03/11/2023 20:31   CT Cervical Spine Wo Contrast  Result Date: 03/12/2023 CLINICAL DATA:  Larey Seat, head trauma EXAM: CT CERVICAL SPINE WITHOUT CONTRAST TECHNIQUE: Multidetector CT imaging of the cervical spine was performed without intravenous contrast. Multiplanar CT image reconstructions were also generated. RADIATION DOSE REDUCTION: This exam was performed according to the departmental dose-optimization program which includes automated exposure control, adjustment of the mA and/or kV according to patient size and/or use of iterative reconstruction technique. COMPARISON:  03/23/2022 FINDINGS: Alignment: Stable mild right convex scoliosis. Skull base and vertebrae: There is a minimally distracted type 2 odontoid fracture, with 2 mm of separation at the fracture site. Otherwise alignment is anatomic. There is also a nondisplaced fracture through the right lamina at C1, with fracture extending through the right C1 facet. Mild comminution at the fracture site. Alignment is anatomic. No other acute displaced cervical spine fractures. Soft tissues and spinal canal: No evidence of canal hematoma. No prevertebral  soft tissue swelling. Disc levels: There are severe degenerative changes at the C1-C2 interface, right much greater than left. There is diffuse facet hypertrophy and spondylosis, most pronounced at the C5-6 and C6-7 levels. Upper chest: Airway is patent. Continue masslike consolidation within the right upper lobe, incompletely evaluated on this study. Other: Reconstructed images confirm the above  findings. IMPRESSION: 1. Type 2 odontoid fracture, with minimal distraction of the fracture site. Otherwise near anatomic alignment. 2. Comminuted right C1 facet fracture and transverse right C1 lamina fracture, with near anatomic alignment. 3. Extensive multilevel cervical degenerative changes as above. 4. Continued masslike consolidation within the right upper lobe, incompletely evaluated on this study. Critical Value/emergent results were called by telephone at the time of interpretation on 03/02/2023 at 8:30 pm to provider [facility] , who verbally acknowledged these results. Electronically Signed   By: Sharlet Salina M.D.   On: 03/02/2023 20:30   CT Head Wo Contrast  Result Date: 03/20/2023 CLINICAL DATA:  Larey Seat today, head trauma EXAM: CT HEAD WITHOUT CONTRAST TECHNIQUE: Contiguous axial images were obtained from the base of the skull through the vertex without intravenous contrast. RADIATION DOSE REDUCTION: This exam was performed according to the departmental dose-optimization program which includes automated exposure control, adjustment of the mA and/or kV according to patient size and/or use of iterative reconstruction technique. COMPARISON:  03/23/2022 FINDINGS: Brain: Diffuse cerebral atrophy, likely age appropriate. No acute infarct or hemorrhage. Lateral ventricles and midline structures are unremarkable. No acute extra-axial fluid collections. No mass effect. Vascular: No hyperdense vessel or unexpected calcification. Stable atherosclerosis. Skull: Scalp hematoma laceration along the midline parietal convexity. No underlying fracture. The remainder of the calvarium is unremarkable. Sinuses/Orbits: No acute finding. Other: None. IMPRESSION: 1. Scalp hematoma laceration along the midline parietal convexity. No underlying fracture. 2. No acute intracranial process. Electronically Signed   By: Sharlet Salina M.D.   On: 03/12/2023 20:23    Positive ROS: All other systems have been reviewed and were  otherwise negative with the exception of those mentioned in the HPI and as above.  Physical Exam: General: Alert, no acute distress Cardiovascular: No pedal edema Respiratory: No cyanosis, no use of accessory musculature GI: No organomegaly, abdomen is soft and non-tender Skin: No lesions in the area of chief complaint Neurologic: Sensation intact distally Psychiatric: Patient is competent for consent with normal mood and affect Lymphatic: No axillary or cervical lymphadenopathy  MUSCULOSKELETAL: Patient is a slender male lying quietly in his hospital bed.  He is alert and oriented.  He has a a [place] collar on his neck.  Leg lengths are equal.  He is right leg is slightly externally rotated compared to the left.  Gentle range of motion of the hip does not produce any significant pain.  Neurovascular status good distally.  Previous incisions well-healed.  Assessment: Minimally displaced medial wall fracture right acetabulum with minimal protrusio  Plan: Nonoperative treatment is indicated here. PT with progressive ambulation toe-touch walking on the right only. Follow-up in my office or with Dr. Audelia Acton in 2 weeks after discharge. He will require skilled nursing facility.    Valinda Hoar, MD [phone]   03/19/2023 11:35 AM

## 2023-03-19 NOTE — Evaluation (Addendum)
Physical Therapy Evaluation Patient Details Name: Eric Hicks MRN: [id number] DOB: [birth year] Today's Date: 03/19/2023  History of Present Illness  Pt admitted to [facility] on 03/03/2023 for c/o mechanical fall resulting in head laceration and R acetabular fracture. Imaging significant for type II odontoid fracture. Neurosurgery consulted and recommending conservative management with c-collar. Ortho consult recommending conservative management of acetabular fracture with TDWB. Significant PMH includes:  CKD-3a, HTN, HLD, COPD, dCHF, A fib not on AC, GIB, prostate cancer, BPH, anemia.   Clinical Impression  Pt is a 89 year old M admitted to hospital on 03/02/2023 for fall at home. At baseline, pt is mod I for ambulation with RW, ADL's, IADL's, and medication management; family assists with transportation. Pt received supine in bed with cervical collar doffed; PT assisted with donning of cervical collar and education regarding precautions and wear time.  Pt presents with BLE weakness (RLE>LLE), limited RLE AROM secondary to hip pain, cervical collar, impaired processing, decreased balance, decreased activity tolerance, decreased knowledge of precautions, impaired skin integrity, and difficulty adhering to RLE WB precautions, resulting in impaired functional mobility from baseline. Due to deficits, pt required min-mod assist for bed mobility via log roll technique, min assist +2 for STS transfers with RW, and min assist +2 to take a few lateral steps towards HOB with RW.   Due to impaired processing and decreased knowledge of precautions, pt required increased multimodal cues for safety, sequencing, RW management, and adherence to RLE precautions during gait. Impaired skin integrity noted to thoracic spine; RN notified with request of mepilex to prevent further breakdown from cervical collar. Increased edema in BLE also noted with request for compression stockings for edema management; RN notified.  Deficits  limit the pt's ability to safely and independently perform ADL's, transfer, and ambulate. Pt will benefit from acute skilled PT services to address deficits for return to baseline function. He will benefit from post acute therapy services.         If plan is discharge home, recommend the following: A little help with walking and/or transfers;A little help with bathing/dressing/bathroom;Assistance with cooking/housework;Assist for transportation;Help with stairs or ramp for entrance   Can travel by private vehicle   Yes    Equipment Recommendations None recommended by PT     Functional Status Assessment Patient has had a recent decline in their functional status and demonstrates the ability to make significant improvements in function in a reasonable and predictable amount of time.     Precautions / Restrictions Precautions Precautions: Cervical;Fall Precaution Comments: has a [place] j collar (donned at all times) without lifting restrictions Required Braces or Orthoses: Cervical Brace Restrictions Weight Bearing Restrictions: Yes RLE Weight Bearing: Touchdown weight bearing      Mobility  Bed Mobility Overal bed mobility: Needs Assistance Bed Mobility: Rolling Rolling: Mod assist         General bed mobility comments: Mod assist for rolling to the LEFT in preparation for log roll technique. Mod assist for trunk facilitation to sit EOB. Multimodal cues for safety, sequencing, and hand placement. Increased time/effort.    Transfers   Equipment used: Rolling walker (2 wheels)               General transfer comment: Min assist +2 for power to stand from EOB with RW. Multimodal cues for safety, sequencing, and hand placement. PT foot placed under pt's RLE for maintenance of WB precautions due to pt difficulty maintaining on his own    Ambulation/Gait Ambulation/Gait assistance: Min  assist, +2 safety/equipment Gait Distance (Feet): 2 Feet (3-4 lateral steps towards  HOB) Assistive device: Rolling walker (2 wheels)         General Gait Details: Min assist +2 for balance, adherence to WB precautions, and for RW management to take a few lateral steps towards HOB with RW. Pt required step by step cues for sequencing due to confusion. PT placed foot under pt's RLE for maintenance of WB precautions; pt able to adhere to RLE WB precautions <25% of the time requiring frequent cues.    Balance Overall balance assessment: Needs assistance   Sitting balance-Leahy Scale: Fair     Standing balance support: During functional activity, Reliant on assistive device for balance, Bilateral upper extremity supported Standing balance-Leahy Scale: Poor Standing balance comment: min assist +2 for standing balance in RW                             Pertinent Vitals/Pain Pain Assessment Pain Assessment: 0-10 Pain Score: 6  Pain Location: Reports pain in right LE, initially a burning sensation down the leg upon standing, upon 2nd round of standing he denied the burning sensation. Pain Descriptors / Indicators: Aching, Burning, Tender Pain Intervention(s): Limited activity within patient's tolerance, Monitored during session, Repositioned (tactile stimulus to RLE for desensitization)    Home Living Family/patient expects to be discharged to:: Skilled nursing facility Living Arrangements: Children                 Additional Comments: history of multiple falls in recent months.    Prior Function Prior Level of Function : Independent/Modified Independent;History of Falls (last six months)             Mobility Comments: Uses RW for household ambulation; daughter endorses multiple falls recently. ADLs Comments: Pt reports he is typically independent with basic self care tasks, light homemaking and meal prep.  He requires assist for heavier household chores and maintenance for the home. Family provides transportation.     Extremity/Trunk Assessment    Upper Extremity Assessment Upper Extremity Assessment: Defer to OT evaluation    Lower Extremity Assessment Lower Extremity Assessment: Generalized weakness (sensation intact)    Cervical / Trunk Assessment Cervical / Trunk Assessment: Normal  Communication   Communication Communication: Other (comment) (HOH) Cueing Techniques: Verbal cues;Tactile cues;Visual cues  Cognition Arousal: Alert Behavior During Therapy: WFL for tasks assessed/performed Overall Cognitive Status: Within Functional Limits for tasks assessed                                 General Comments: Pt is hard of hearing, therefore many commands were repeated during session.  He was able to demonstrate understanding of the need for short term rehab prior to returning home and was in agreement.        General Comments General comments (skin integrity, edema, etc.): +2-3 BLE edema (R>L) which pt and family note has worsened since the fall. 2 red spots noted to thoracic spine; RN notified for covering to prevent skin breakdown from cervical brace. Healing scab to posterior scalp from fall.    Exercises Other Exercises Other Exercises: Participates in bed mobility, transfers, and minimal gait. Increased assist and cueing for adherence to RLE WB precautions. Assisted with donning and adjustment of cervical collar for appropriate positioning during session. Other Exercises: Pt and family educated re: PT role/POC, DC recommendations, safety with  functional mobility, RLE WB precautions, cervical precautions, cervical brace management and wear time. They verbalized understanding.   Assessment/Plan    PT Assessment Patient needs continued PT services  PT Problem List Decreased strength;Decreased range of motion;Decreased activity tolerance;Decreased balance;Decreased mobility;Decreased cognition;Decreased safety awareness;Decreased skin integrity;Pain       PT Treatment Interventions DME instruction;Gait  training;Stair training;Functional mobility training;Therapeutic activities;Therapeutic exercise;Balance training;Neuromuscular re-education;Patient/family education;Wheelchair mobility training    PT Goals (Current goals can be found in the Care Plan section)  Acute Rehab PT Goals Patient Stated Goal: "walking" PT Goal Formulation: With patient/family Time For Goal Achievement: 04/02/23 Potential to Achieve Goals: Good    Frequency 7X/week     Co-evaluation   Reason for Co-Treatment: Complexity of the patient's impairments (multi-system involvement);To address functional/ADL transfers           AM-PAC PT "6 Clicks" Mobility  Outcome Measure Help needed turning from your back to your side while in a flat bed without using bedrails?: A Lot Help needed moving from lying on your back to sitting on the side of a flat bed without using bedrails?: A Little Help needed moving to and from a bed to a chair (including a wheelchair)?: A Little Help needed standing up from a chair using your arms (e.g., wheelchair or bedside chair)?: A Little Help needed to walk in hospital room?: A Little Help needed climbing 3-5 steps with a railing? : A Lot 6 Click Score: 16    End of Session Equipment Utilized During Treatment: Gait belt;Cervical collar Activity Tolerance: Patient tolerated treatment well Patient left: in bed;with call bell/phone within reach;with bed alarm set;with family/visitor present;with SCD's reapplied (cervical collar donned; pillows under BLE to float heels) Nurse Communication: Mobility status;Precautions;Weight bearing status (skin integrity at thoracic spine) PT Visit Diagnosis: Unsteadiness on feet (R26.81);Repeated falls (R29.6);Muscle weakness (generalized) (M62.81);Pain Pain - Right/Left: Right Pain - part of body: Hip    Time: 1350-1442 PT Time Calculation (min) (ACUTE ONLY): 52 min   Charges:   PT Evaluation $PT Eval Moderate Complexity: 1 Mod PT  Treatments $Gait Training: 8-22 mins $Therapeutic Activity: 8-22 mins PT General Charges $$ ACUTE PT VISIT: 1 Visit        Vira Blanco, PT, DPT 3:44 PM,03/19/23 Physical Therapist - [facility]

## 2023-03-19 NOTE — TOC Progression Note (Signed)
Transition of Care [facility]) - Progression Note    Patient Details  Name: Eric Hicks MRN: [id number] Date of Birth: 06/19/1926  [facility] CM/SW Contact  Liliana Cline, LCSW Phone Number: 03/19/2023, 3:44 PM  Clinical Narrative:    Attempted call to son Iantha Fallen to discuss SNF rec. Left a VM requesting a return call.         Expected Discharge Plan and Services                                               Social Determinants of Health (SDOH) Interventions SDOH Screenings   Food Insecurity: No Food Insecurity (03/18/2023)  Housing: Low Risk  (03/18/2023)  Transportation Needs: No Transportation Needs (03/18/2023)  Utilities: Not At Risk (03/18/2023)  Alcohol Screen: Low Risk  (10/11/2022)  Depression (PHQ2-9): Low Risk  (12/22/2022)  Recent Concern: Depression (PHQ2-9) - Medium Risk (11/17/2022)  Financial Resource Strain: Low Risk  (10/11/2022)  Physical Activity: Insufficiently Active (10/11/2022)  Social Connections: Socially Integrated (10/11/2022)  Recent Concern: Social Connections - Moderately Isolated (08/15/2022)  Stress: No Stress Concern Present (10/11/2022)  Tobacco Use: Medium Risk (03/18/2023)    Readmission Risk Interventions    11/20/2022    1:37 PM  Readmission Risk Prevention Plan  Transportation Screening Complete  PCP or Specialist Appt within 3-5 Days Complete  HRI or Home Care Consult Complete  Social Work Consult for Recovery Care Planning/Counseling Complete  Palliative Care Screening Not Applicable  Medication Review Oceanographer) Complete

## 2023-03-20 DIAGNOSIS — S32474A Nondisplaced fracture of medial wall of right acetabulum, initial encounter for closed fracture: Secondary | ICD-10-CM

## 2023-03-20 DIAGNOSIS — N184 Chronic kidney disease, stage 4 (severe): Secondary | ICD-10-CM | POA: Diagnosis not present

## 2023-03-20 DIAGNOSIS — Z86711 Personal history of pulmonary embolism: Secondary | ICD-10-CM | POA: Diagnosis not present

## 2023-03-20 DIAGNOSIS — S72001S Fracture of unspecified part of neck of right femur, sequela: Secondary | ICD-10-CM | POA: Diagnosis not present

## 2023-03-20 DIAGNOSIS — W19XXXA Unspecified fall, initial encounter: Secondary | ICD-10-CM | POA: Diagnosis not present

## 2023-03-20 DIAGNOSIS — Z7189 Other specified counseling: Secondary | ICD-10-CM

## 2023-03-20 DIAGNOSIS — S12110A Anterior displaced Type II dens fracture, initial encounter for closed fracture: Secondary | ICD-10-CM | POA: Diagnosis not present

## 2023-03-20 DIAGNOSIS — Z515 Encounter for palliative care: Secondary | ICD-10-CM | POA: Diagnosis not present

## 2023-03-20 LAB — CBC
HCT: 39.6 % (ref 39.0–52.0)
Hemoglobin: 13 g/dL (ref 13.0–17.0)
MCH: 32.3 pg (ref 26.0–34.0)
MCHC: 32.8 g/dL (ref 30.0–36.0)
MCV: 98.3 fL (ref 80.0–100.0)
Platelets: 157 10*3/uL (ref 150–400)
RBC: 4.03 MIL/uL — ABNORMAL LOW (ref 4.22–5.81)
RDW: 14.6 % (ref 11.5–15.5)
WBC: 11.4 10*3/uL — ABNORMAL HIGH (ref 4.0–10.5)
nRBC: 0 % (ref 0.0–0.2)

## 2023-03-20 LAB — BASIC METABOLIC PANEL
Anion gap: 11 (ref 5–15)
BUN: 49 mg/dL — ABNORMAL HIGH (ref 8–23)
CO2: 22 mmol/L (ref 22–32)
Calcium: 8.9 mg/dL (ref 8.9–10.3)
Chloride: 111 mmol/L (ref 98–111)
Creatinine, Ser: 1.51 mg/dL — ABNORMAL HIGH (ref 0.61–1.24)
GFR, Estimated: 43 mL/min — ABNORMAL LOW (ref >60)
Glucose, Bld: 133 mg/dL — ABNORMAL HIGH (ref 70–99)
Potassium: 3.8 mmol/L (ref 3.5–5.1)
Sodium: 144 mmol/L (ref 135–145)

## 2023-03-20 LAB — GLUCOSE, CAPILLARY
Glucose-Capillary: 124 mg/dL — ABNORMAL HIGH (ref 70–99)
Glucose-Capillary: 150 mg/dL — ABNORMAL HIGH (ref 70–99)

## 2023-03-20 MED ORDER — ACETAMINOPHEN 325 MG PO TABS: 650.0000 mg | ORAL_TABLET | Freq: Four times a day (QID) | ORAL | Status: DC | PRN

## 2023-03-20 MED ORDER — HALOPERIDOL LACTATE 5 MG/ML IJ SOLN: 0.5000 mg | Freq: Four times a day (QID) | INTRAMUSCULAR | Status: DC | PRN

## 2023-03-20 MED ORDER — POLYVINYL ALCOHOL 1.4 % OP SOLN: 1.0000 [drp] | Freq: Four times a day (QID) | OPHTHALMIC | Status: DC | PRN

## 2023-03-20 MED ORDER — HALOPERIDOL LACTATE 5 MG/ML IJ SOLN: 0.5000 mg | INTRAMUSCULAR | Status: DC | PRN

## 2023-03-20 MED ORDER — GLYCOPYRROLATE 0.2 MG/ML IJ SOLN: 0.2000 mg | INTRAMUSCULAR | Status: DC | PRN

## 2023-03-20 MED ORDER — HYDROMORPHONE HCL 1 MG/ML IJ SOLN: 0.5000 mg | INTRAMUSCULAR | Status: DC | PRN

## 2023-03-20 MED ORDER — LORAZEPAM 2 MG/ML IJ SOLN
1.0000 mg | INTRAMUSCULAR | Status: DC | PRN
  Administered 2023-03-21: 1 mg via INTRAVENOUS
  Filled 2023-03-20: qty 1

## 2023-03-20 MED ORDER — BIOTENE DRY MOUTH MT LIQD: 15.0000 mL | OROMUCOSAL | Status: DC | PRN

## 2023-03-20 MED ORDER — HALOPERIDOL 0.5 MG PO TABS: 0.5000 mg | ORAL_TABLET | ORAL | Status: DC | PRN

## 2023-03-20 MED ORDER — ONDANSETRON 4 MG PO TBDP: 4.0000 mg | ORAL_TABLET | Freq: Four times a day (QID) | ORAL | Status: DC | PRN

## 2023-03-20 MED ORDER — HALOPERIDOL LACTATE 2 MG/ML PO CONC: 0.5000 mg | ORAL | Status: DC | PRN

## 2023-03-20 MED ORDER — LORAZEPAM 1 MG PO TABS: 1.0000 mg | ORAL_TABLET | ORAL | Status: DC | PRN

## 2023-03-20 MED ORDER — HYDRALAZINE HCL 10 MG PO TABS
10.0000 mg | ORAL_TABLET | Freq: Three times a day (TID) | ORAL | Status: DC
  Administered 2023-03-20: 10 mg via ORAL
  Filled 2023-03-20: qty 1

## 2023-03-20 MED ORDER — ACETAMINOPHEN 650 MG RE SUPP: 650.0000 mg | Freq: Four times a day (QID) | RECTAL | Status: DC | PRN

## 2023-03-20 MED ORDER — ONDANSETRON HCL 4 MG/2ML IJ SOLN: 4.0000 mg | Freq: Four times a day (QID) | INTRAMUSCULAR | Status: DC | PRN

## 2023-03-20 MED ORDER — GLYCOPYRROLATE 1 MG PO TABS: 1.0000 mg | ORAL_TABLET | ORAL | Status: DC | PRN

## 2023-03-20 MED ORDER — LORAZEPAM 2 MG/ML PO CONC: 1.0000 mg | ORAL | Status: DC | PRN

## 2023-03-20 NOTE — Consult Note (Signed)
Consultation Note Date: 03/20/2023   Patient Name: Eric Hicks  DOB: 1927/01/03  MRN: [id number]  Age / Sex: 89 y.o., male  PCP: Sherlene Shams, MD Referring Physician: Delfino Lovett, MD  Reason for Consultation: Establishing goals of care   HPI/Brief Hospital Course: 89 y.o. male  with past medical history of CKD stage 3b, COPD, dCHF, A. Fib not on AC due to history of GIB, prostate cancer, HTN and HLD admitted from home on 03/01/2023 s/p fall.  Imaging revealed C2/C1 combination fracture and fracture through medial wall of right acetabulum  Neurosurgery and ortho consulted-conservative management, cervical spine collar for cervical fracture with recommendations for SNF and close outpatient follow-up   Palliative medicine was consulted for assisting with goals of care conversations.  Subjective:  Extensive chart review has been completed prior to meeting patient including labs, vital signs, imaging, progress notes, orders, and available advanced directive documents from current and previous encounters.  Visited with Mr. Renfroe at his bedside. On arrival to room, cervical collar had been removed, Mr. Hatter with legs dangling off bed, speech garbled and unintelligible, active visual hallucinations, shortness of breath and disorientation. Nursing called to bedside. Per nursing, Mr. Talmage developed above symptoms and worsening symptoms throughout the day, nursing staff had notified primary team. I discussed findings with both primary team and neurosurgery team, concerned for overall prognosis. Recommended nursing staff treat Mr. Vetsch for pain and recommended moving to room closer to nurses station. No family at bedside during time of visit.  Called and spoke with daughter-Debra.Introduced myself as a Publishing rights manager as a member of the palliative care team. Explained palliative medicine is specialized medical care for people living with serious illness. It focuses on  providing relief from the symptoms and stress of a serious illness. The goal is to improve quality of life for both the patient and the family.   Relayed above assessment as well as concern for overall prognosis. [name] shares prior to admission, Mr. Ory lived at home and functioned independently. He just recently had his son who lives near by drive and accompany him to his doctor appointments. [name] shares about a year ago, Mr. Kilduff had a fall requiring hip replacement and prolonged hospitalization at outside hospital, he was then discharged to rehab and has been home independently since. [name] voices understanding of situation and remains hopeful her father will recover.  [employer] page for RRT called, responded to room. Mr. Mehrer with worsening mental state, continued visual hallucinations, concern for terminal agitation. Collaborated with Dr. Sherryll Burger, both concerned for nearing end of life, orders placed for Haldol as needed for agitation. Called again and spoke with daughter-Debra, voiced concerns for terminal agitation, requested she and family bedside visit if able. Dr. Sherryll Burger also able to connect with daughter and son, both on board with transitioning to comfort care.  Visited with family at bedside once they arrived, Mr. Barrozo remains with agitation, confusion, garbled speech and hallucinations. Voiced my concern for terminal agitation at this time and fear Mr. Cola is nearing the end of his life. Family verbalized understanding and wish to proceed with full comfort measures at this time. Mr. Landon would no longer receive aggressive medical interventions such as continuous vital signs, lab work, radiology testing, or medications not focused on comfort. All care would focus on how the patient is looking and feeling. This would include management of any symptoms that may cause discomfort, pain, shortness of breath, cough, nausea, agitation, anxiety, and/or secretions etc. Symptoms would  be managed with medications and  other non-pharmacological interventions such as spiritual support if requested, repositioning, music therapy, or therapeutic listening. Family verbalized understanding and appreciation.   Family wanting to discuss Hospice IPU, at this time shared with family I do not feel Mr. Harriman would be stable for transport at this time but could be addressed if appropriate in AM.  Orders and MAR updated to reflect comfort measures.   All questions/concerns addressed. Emotional support provided to patient/family/support persons. PMT will continue to follow and support patient as needed.  Objective: Primary Diagnoses: Present on Admission:  History of pulmonary embolism  Closed right hip fracture (HCC)  CKD (chronic kidney disease) stage 4, GFR 15-29 ml/min (HCC)  COPD (chronic obstructive pulmonary disease) with emphysema (HCC)  Chronic diastolic CHF (congestive heart failure) (HCC)  HTN (hypertension)  Paroxysmal atrial fibrillation (HCC)  Lung cancer, upper lobe (HCC)  Controlled type 2 diabetes mellitus with stage 3 chronic kidney disease, without long-term current use of insulin (HCC)   Vital Signs: BP (!) 139/118   Pulse 95   Temp 98 F (36.7 C)   Resp (!) 32   Ht 5\' 10"  (1.778 m)   Wt 72.6 kg   SpO2 95%   BMI 22.96 kg/m  Pain Scale: PAINAD POSS *See Group Information*: 1-Acceptable,Awake and alert Pain Score: 0-No pain (patient denies)  IO: Intake/output summary:  Intake/Output Summary (Last 24 hours) at 03/20/2023 1635 Last data filed at 03/20/2023 0900 Gross per 24 hour  Intake 150 ml  Output 800 ml  Net -650 ml    LBM: Last BM Date :  (PTA) Baseline Weight: Weight: 72.6 kg Most recent weight: Weight: 72.6 kg       Assessment and Plan  SUMMARY OF RECOMMENDATIONS   DNR/DNI/Comfort Recommend initiating hydromorphone infusion if comfort not met with IV push PRN Anticipate hospital passing  Palliative Prophylaxis:   Bowel Regimen, Delirium Protocol and Frequent Pain  Assessment  Spiritual:  Desire for ongoing Chaplain support: Yes Education provided on Chaplain services offered through PMT, education provided on Grief/Bereavement support services    Discussed With:Primary team and nursing staff   Thank you for this consult and allowing Palliative Medicine to participate in the care of Mckade C. Havens. Palliative medicine will continue to follow and assist as needed.   Time Total: 75 minutes  Time spent includes: Detailed review of medical records (labs, imaging, vital signs), medically appropriate exam (mental status, respiratory, cardiac, skin), discussed with treatment team, counseling and educating patient, family and staff, documenting clinical information, medication management and coordination of care.   Signed by: Leeanne Deed, DNP, AGNP-C Palliative Medicine    Please contact Palliative Medicine Team phone at [phone] for questions and concerns.  For individual provider: See Loretha Stapler

## 2023-03-20 NOTE — Plan of Care (Signed)

## 2023-03-20 NOTE — Progress Notes (Signed)
Subjective:    No significant change in condition regarding the right hip both since yesterday.  Remains fairly comfortable.  Says he has been out of bed.  Remains in a [place] collar.  Patient reports pain as mild.  Objective:   VITALS:   Vitals:   03/20/23 1418 03/20/23 1421  BP:  (!) 139/118  Pulse:  95  Resp:    Temp:    SpO2: (!) 85% 95%    Range of motion of the right hip is satisfactory.  Minimal pain.  No catching or popping.  Leg lengths are good.  LABS Recent Labs    03/18/23 0458 03/19/23 0308 03/20/23 0315  HGB 12.5* 12.7* 13.0  HCT 38.5* 38.4* 39.6  WBC 15.1* 15.5* 11.4*  PLT 162 154 157    Recent Labs    03/18/23 0458 03/19/23 0308 03/20/23 0315  NA 144 142 144  K 4.0 4.3 3.8  BUN 44* 50* 49*  CREATININE 1.50* 1.77* 1.51*  GLUCOSE 160* 148* 133*    No results for input(s): "LABPT", "INR" in the last 72 hours.   Assessment/Plan:      Up with therapy Discharge to SNF when bed available.

## 2023-03-20 NOTE — Progress Notes (Signed)
Responded to rapid-no needs at this time-doctor shared she was paging family as she feels he is declining. She will page when further needs arise

## 2023-03-20 NOTE — Progress Notes (Signed)
Physical Therapy Discharge Patient Details Name: Eric Hicks MRN: [id number] DOB: 1926/04/27 Today's Date: 03/20/2023 Time: 4098-1191 PT Time Calculation (min) (ACUTE ONLY): 14 min  Patient discharged from PT services secondary to medical decline - will need to re-order PT to resume therapy services.  Please see latest therapy progress note for current level of functioning and progress toward goals.    Orders received to DC PT from medical team.         Danielle Dess 03/20/2023, 4:04 PM

## 2023-03-20 NOTE — Progress Notes (Addendum)
1      PROGRESS NOTE    Eric Hicks  GNF:[id number] DOB: September 29, 1926 DOA: 03/16/2023 PCP: Sherlene Shams, MD    Brief Narrative:   89 y.o. male with h/o CKD-3a, HTN, HLD, COPD, dCHF, A fib not on AC, GIB, prostate cancer, BPH, anemia admitted s/p fall and found to have C2/C1 combination fracture and also fracture through the medial wall of the right acetabulum.   11/22: Neurosurgery and Ortho c/s 11/23: Conservative management per neurosurgery and Ortho.  PT and OT eval.  Will likely need SNF placement.  Added tramadol for better pain control 11/24: Waiting for placement, having visual hallucination, agitation, trying to remove his neck collar twice this am per nursing.  Assessment & Plan:   Principal Problem:   Fall at home, initial encounter Active Problems:   History of pulmonary embolism   Closed right hip fracture (HCC)   CKD (chronic kidney disease) stage 4, GFR 15-29 ml/min (HCC)   COPD (chronic obstructive pulmonary disease) with emphysema (HCC)   Chronic diastolic CHF (congestive heart failure) (HCC)   HTN (hypertension)   Paroxysmal atrial fibrillation (HCC)   Lung cancer, upper lobe (HCC)   Controlled type 2 diabetes mellitus with stage 3 chronic kidney disease, without long-term current use of insulin (HCC)   Recurrent falls   Protein-calorie malnutrition, severe  Acute type II odontoid fracture as well as a C1 fracture. S/p fall - Neurosurgery seen - conservative mgmt planned per d/w family by neurosurgery - cervical brace for now.  The [place] collar/brace is not very comfortable and patient takes it out when he gets annoyed.  Neurosurgery has discussed with family for the management of cervical collar - outpt neurosurgery f/up   Minimally displaced medial wall fracture right acetabulum with minimal protrusion Ortho c/s - Dr Robbie Lis seen - conservative mgmt -PT and OT eval-progressive ambulation to-touch walking on the right only. Outpatient Ortho follow-up  with Dr. Wynonia Sours. Hyacinth Meeker in 2 weeks postdischarge Added tramadol for pain control  CKD 3b Stable and at baseline  COPD (chronic obstructive pulmonary disease) with emphysema (HCC) Prn albuterol.  Cont with advair.    Chronic diastolic CHF (congestive heart failure) (HCC) Stable, last echo was last year ;  Left ventricular ejection fraction, by estimation, is 55 to 60% .      HTN (hypertension) Paroxysmal atrial fibrillation (HCC) His blood pressure is staying up.  He is somewhat also tachypneic Will add hydralazine for better blood pressure control  Recurrent falls Multifactorial, PT, OT eval   Controlled type 2 diabetes mellitus with stage 3 chronic kidney disease, without long-term current use of insulin (HCC) SSI for now.    H/o Lung cancer, upper lobe (HCC) Mass seen on CT, outpt f/up with Onco Pneumonia ruled out.  Will stop antibiotics.  He denies any cough shortness of breath.  He has no fever  Agitation, Hallucination Could be Hospital induced delirium vs terminal agitation Haldol 0.5 mg q 6 hrs prn Move him close to nursing station Use prn pain meds  Goals of care Will consult palliative care. Family leaning towards keeping him comfortable based on my d/w them over phone.  DVT prophylaxis: SCD SCDs Start: 03/18/23 0034     Code Status: DNR Family Communication: Updated both sons over phone Disposition Plan: possible D/C in next 1-2 days   Consultants:  Neurosurgery Ortho    Antimicrobials:  Rocephin-now stopped Zithromax-now stopped   Subjective:  He removed neck collar twice this morning per  nursing.  He does get uncomfortable for him and nursing have been trying to readjust the collar..  Family has been educated by neurosurgery about [place] collar  Objective: Vitals:   03/19/23 1534 03/19/23 1934 03/20/23 0723 03/20/23 0815  BP: (!) 152/104 (!) 160/119 (!) 161/113   Pulse: (!) 104 (!) 56 61   Resp: 20  (!) 24 (!) 30  Temp: 98.7 F  (37.1 C) 97.7 F (36.5 C) 97.6 F (36.4 C)   TempSrc: Oral Oral    SpO2: 93% 96% 92%   Weight:      Height:        Intake/Output Summary (Last 24 hours) at 03/20/2023 1204 Last data filed at 03/20/2023 0900 Gross per 24 hour  Intake 150 ml  Output 800 ml  Net -650 ml   Filed Weights   03/14/2023 1920  Weight: 72.6 kg    Examination:  General exam: Appears calm and comfortable, [place] collar on his neck in place but patient does not seem comfortable and keeps removing per nursing Respiratory system: Clear to auscultation. Respiratory effort normal. Cardiovascular system: S1 & S2 heard, RRR. No JVD, murmurs, rubs, gallops or clicks. No pedal edema. Gastrointestinal system: Abdomen is soft, benign Central nervous system: Alert and oriented. No focal neurological deficits. Extremities: Right leg is externally rotated slightly compared to left.  No tenderness Skin: No rashes, lesions or ulcers Psychiatry: Intermittent agitation and visual hallucinations    Data Reviewed: I have personally reviewed following labs and imaging studies  CBC: Recent Labs  Lab 02/25/2023 2036 03/18/23 0458 03/19/23 0308 03/20/23 0315  WBC 11.2* 15.1* 15.5* 11.4*  NEUTROABS 9.6* 13.8*  --   --   HGB 12.1* 12.5* 12.7* 13.0  HCT 37.6* 38.5* 38.4* 39.6  MCV 100.5* 101.0* 98.7 98.3  PLT 171 162 154 157   Basic Metabolic Panel: Recent Labs  Lab 02/25/2023 2036 03/18/23 0458 03/19/23 0308 03/20/23 0315  NA 144 144 142 144  K 4.1 4.0 4.3 3.8  CL 111 110 111 111  CO2 23 23 23 22   GLUCOSE 125* 160* 148* 133*  BUN 40* 44* 50* 49*  CREATININE 1.63* 1.50* 1.77* 1.51*  CALCIUM 8.8* 8.8* 8.7* 8.9  MG  --  2.1  --   --    GFR: Estimated Creatinine Clearance: 30.7 mL/min (A) (by C-G formula based on SCr of 1.51 mg/dL (H)). Liver Function Tests: Recent Labs  Lab 03/19/2023 2036 03/18/23 0458  AST 47* 36  ALT 46* 43  ALKPHOS 90 72  BILITOT 1.1 0.9  PROT 5.7* 6.3*  ALBUMIN 3.3* 3.4*     CBG: Recent Labs  Lab 03/19/23 1138 03/19/23 1651 03/19/23 1938 03/20/23 0724 03/20/23 1124  GLUCAP 158* 163* 130* 124* 150*    Sepsis Labs: Recent Labs  Lab 03/19/2023 2302 03/18/23 0148 03/18/23 0248  PROCALCITON <0.10  --   --   LATICACIDVEN  --  1.0 0.9    Recent Results (from the past 240 hour(s))  Culture, blood (Routine X 2) w Reflex to ID Panel     Status: None (Preliminary result)   Collection Time: 03/18/23  1:20 AM   Specimen: BLOOD  Result Value Ref Range Status   Specimen Description BLOOD LEFT ANTECUBITAL  Final   Special Requests   Final    BOTTLES DRAWN AEROBIC AND ANAEROBIC Blood Culture adequate volume   Culture   Final    NO GROWTH 2 DAYS Performed at [facility], [address] [place], [place]  [address]    Report Status PENDING  Incomplete  Culture, blood (Routine X 2) w Reflex to ID Panel     Status: None (Preliminary result)   Collection Time: 03/18/23  1:48 AM   Specimen: BLOOD  Result Value Ref Range Status   Specimen Description BLOOD BLOOD RIGHT WRIST  Final   Special Requests   Final    BOTTLES DRAWN AEROBIC AND ANAEROBIC Blood Culture adequate volume   Culture   Final    NO GROWTH 2 DAYS Performed at [facility], [address], [place], [place] [address]    Report Status PENDING  Incomplete         Radiology Studies: No results found.      Scheduled Meds:  atorvastatin  20 mg Oral QODAY   brimonidine  1 drop Both Eyes TID   dorzolamide-timolol  1 drop Both Eyes BID   feeding supplement  237 mL Oral TID BM   fluticasone furoate-vilanterol  1 puff Inhalation Daily   insulin aspart  0-5 Units Subcutaneous QHS   insulin aspart  0-9 Units Subcutaneous TID WC   latanoprost  1 drop Both Eyes QHS   multivitamin with minerals  1 tablet Oral Daily   sodium chloride flush  3 mL Intravenous Q12H   Continuous Infusions:     LOS: 2 days    Time spent: 35 mins    Eric Kneale Sherryll Burger, MD [facility] Pager [phone]  If 7PM-7AM, please contact night-coverage [email or web address] Password [email or web address] 03/20/2023, 12:04 PM

## 2023-03-20 NOTE — NC FL2 (Signed)
[place] MEDICAID FL2 LEVEL OF CARE FORM     IDENTIFICATION  Patient Name: Eric Hicks Birthdate: [birth year] Sex: male Admission Date (Current Location): 03/18/2023  [facility] [place] Number:  Chiropodist and Address:  [facility], [address], [place], [place] [address]      Provider Number: [id number]  Attending Physician Name and Address:  Delfino Lovett, MD  Relative Name and Phone Number:  Anell Barr (Daughter)  [phone] [facility])    Current Level of Care: Hospital Recommended Level of Care: Skilled Nursing Facility Prior Approval Number:    Date Approved/Denied:   PASRR Number: [id number]  Discharge Plan:      Current Diagnoses: Patient Active Problem List   Diagnosis Date Noted   Fall at home, initial encounter 03/18/2023   Protein-calorie malnutrition, severe 03/18/2023   Cystitis 12/24/2022   Recurrent falls 11/19/2022   Acute renal failure superimposed on stage 3a chronic kidney disease (HCC) 11/18/2022   Chronic diastolic CHF (congestive heart failure) (HCC) 11/18/2022   HTN (hypertension) 11/18/2022   Hypokalemia 11/18/2022   Controlled type 2 diabetes mellitus with stage 3 chronic kidney disease, without long-term current use of insulin (HCC) 09/24/2022   History of GI bleed 08/18/2022   Acute blood loss as cause of postoperative anemia 08/18/2022   History of pulmonary embolism 03/26/2022   Closed right hip fracture (HCC) 03/23/2022   CKD (chronic kidney disease) stage 4, GFR 15-29 ml/min (HCC) 03/23/2022   Hyperlipidemia 03/23/2022   B12 deficiency 09/06/2021   Counseling regarding end of life decision making 03/04/2021   Lung cancer, upper lobe (HCC) 09/01/2019   Thoracic ascending aortic aneurysm (HCC) 09/01/2019   Nonischemic dilated cardiomyopathy (HCC) 07/08/2018   Paroxysmal atrial fibrillation (HCC) 07/08/2018   Aortic arch atherosclerosis (HCC) 07/08/2018   Low back pain 12/03/2017   OA  (osteoarthritis) of neck 09/29/2017   Prostate cancer (HCC) 03/30/2016   S/p bilateral myringotomy with tube placement 03/30/2016   Hearing aid worn 03/30/2016   Chronic hip pain 04/13/2014   White coat syndrome with diagnosis of hypertension 11/12/2012   Medicare annual wellness visit, subsequent 11/10/2012   Osteoporosis 01/03/2012   History of adenomatous polyp of colon 11/14/2011   COPD (chronic obstructive pulmonary disease) with emphysema (HCC) 11/14/2011   BPH (benign prostatic hyperplasia)     Orientation RESPIRATION BLADDER Height & Weight     Self, Time  Normal External catheter Weight: 160 lb (72.6 kg) Height:  5\' 10"  (177.8 cm)  BEHAVIORAL SYMPTOMS/MOOD NEUROLOGICAL BOWEL NUTRITION STATUS      Continent Diet (reg)  AMBULATORY STATUS COMMUNICATION OF NEEDS Skin   Limited Assist Verbally Bruising                       Personal Care Assistance Level of Assistance  Bathing, Feeding, Dressing Bathing Assistance: Limited assistance Feeding assistance: Limited assistance Dressing Assistance: Limited assistance     Functional Limitations Info             SPECIAL CARE FACTORS FREQUENCY  PT (By licensed PT), OT (By licensed OT)     PT Frequency: 5 times per week OT Frequency: 5 times per week            Contractures      Additional Factors Info  Code Status, Allergies Code Status Info: Do not attempt resuscitation (DNR) -DNR-LIMITED -Do Not Intubate/DNI Allergies Info: Tylenol (Acetaminophen)           Current Medications (  03/20/2023):  This is the current hospital active medication list Current Facility-Administered Medications  Medication Dose Route Frequency Provider Last Rate Last Admin   atorvastatin (LIPITOR) tablet 20 mg  20 mg Oral Eston Esters V, MD   20 mg at 03/19/23 0820   brimonidine (ALPHAGAN) 0.2 % ophthalmic solution 1 drop  1 drop Both Eyes TID Gertha Calkin, MD   1 drop at 03/19/23 2120   dorzolamide-timolol (COSOPT) 2-0.5  % ophthalmic solution 1 drop  1 drop Both Eyes BID Irena Cords V, MD   1 drop at 03/19/23 2120   feeding supplement (ENSURE ENLIVE / ENSURE PLUS) liquid 237 mL  237 mL Oral TID BM Delfino Lovett, MD   237 mL at 03/19/23 1940   fluticasone furoate-vilanterol (BREO ELLIPTA) 100-25 MCG/ACT 1 puff  1 puff Inhalation Daily Irena Cords V, MD   1 puff at 03/19/23 8469   HYDROmorphone (DILAUDID) injection 0.5 mg  0.5 mg Intravenous Q2H PRN Merwyn Katos, MD   0.5 mg at 03/19/23 0021   insulin aspart (novoLOG) injection 0-5 Units  0-5 Units Subcutaneous QHS Sherryll Burger, Vipul, MD       insulin aspart (novoLOG) injection 0-9 Units  0-9 Units Subcutaneous TID WC Delfino Lovett, MD   2 Units at 03/19/23 1718   ipratropium-albuterol (DUONEB) 0.5-2.5 (3) MG/3ML nebulizer solution 3 mL  3 mL Nebulization Q4H PRN Gertha Calkin, MD       latanoprost (XALATAN) 0.005 % ophthalmic solution 1 drop  1 drop Both Eyes QHS Gertha Calkin, MD   1 drop at 03/19/23 2121   multivitamin with minerals tablet 1 tablet  1 tablet Oral Daily Delfino Lovett, MD   1 tablet at 03/19/23 0820   sodium chloride flush (NS) 0.9 % injection 3 mL  3 mL Intravenous Q12H Gertha Calkin, MD   3 mL at 03/19/23 2127   traMADol (ULTRAM) tablet 50 mg  50 mg Oral Q6H PRN Deeann Saint, MD         Discharge Medications: Please see discharge summary for a list of discharge medications.  Relevant Imaging Results:  Relevant Lab Results:   Additional Information SS #: [id number]  Linzey Ramser E Denaisha Swango, LCSW

## 2023-03-20 NOTE — Progress Notes (Signed)
Physical Therapy Treatment Patient Details Name: Eric Hicks MRN: [id number] DOB: 1927/02/28 Today's Date: 03/20/2023   History of Present Illness Pt admitted to [facility] on 03/24/2023 for c/o mechanical fall resulting in head laceration and R acetabular fracture. Imaging significant for type II odontoid fracture. Neurosurgery consulted and recommending conservative management with c-collar. Ortho consult recommending conservative management of acetabular fracture with TDWB. Significant PMH includes:  CKD-3a, HTN, HLD, COPD, dCHF, A fib not on AC, GIB, prostate cancer, BPH, anemia.    PT Comments  Walked by prior to lunch.  Pt had removed cervical collar and he is assisted in reapplying it.  He is verbal and asks for his glasses despite some agitation.  Returned at 14:15 and RN in room.  Pt inc of urine and had removed purwic system.  He has also removed cervical collar again.  RN taking vitals and concerns for increased BP, respirations, decreased sats, increased agitation/confusion and unable to respond to questions.  Pt reaching for ceiling throughout interventions.  Assisted RN with rolling left/right with max a x 2 for linen change and care. RN stated he did get up to [facility] earlier and it was quite challenging.   Rapid is called by RN due to change in status and appropriate staff in to assist.    If plan is discharge home, recommend the following: Assistance with cooking/housework;Assist for transportation;Help with stairs or ramp for entrance;Two people to help with walking and/or transfers;Two people to help with bathing/dressing/bathroom   Can travel by private vehicle     Yes  Equipment Recommendations  None recommended by PT    Recommendations for Other Services       Precautions / Restrictions Precautions Precautions: Cervical;Fall Precaution Comments: has a [place] j collar (donned at all times) without lifting restrictions Required Braces or Orthoses: Cervical  Brace Restrictions Weight Bearing Restrictions: Yes RLE Weight Bearing: Touchdown weight bearing     Mobility  Bed Mobility Overal bed mobility: Needs Assistance Bed Mobility: Rolling Rolling: Total assist, +2 for physical assistance           Patient Response: Restless  Transfers                        Ambulation/Gait                   Stairs             Wheelchair Mobility     Tilt Bed Tilt Bed Patient Response: Restless  Modified Rankin (Stroke Patients Only)       Balance                                            Cognition Arousal: Lethargic Behavior During Therapy: Restless Overall Cognitive Status: Impaired/Different from baseline                                          Exercises Other Exercises Other Exercises: assissted RN with care    General Comments        Pertinent Vitals/Pain Pain Assessment Pain Assessment: Faces Pain Location: some grimmaces with mobility but limited due to medical concerns Pain Descriptors / Indicators: Grimacing Pain Intervention(s): Limited activity within patient's tolerance, Monitored during session, Repositioned  Home Living                          Prior Function            PT Goals (current goals can now be found in the care plan section) Progress towards PT goals: Not progressing toward goals - comment    Frequency    7X/week      PT Plan      Co-evaluation              AM-PAC PT "6 Clicks" Mobility   Outcome Measure  Help needed turning from your back to your side while in a flat bed without using bedrails?: Total Help needed moving from lying on your back to sitting on the side of a flat bed without using bedrails?: Total Help needed moving to and from a bed to a chair (including a wheelchair)?: Total Help needed standing up from a chair using your arms (e.g., wheelchair or bedside chair)?: Total Help  needed to walk in hospital room?: Total Help needed climbing 3-5 steps with a railing? : Total 6 Click Score: 6    End of Session Equipment Utilized During Treatment: Gait belt;Cervical collar Activity Tolerance: Patient tolerated treatment well Patient left: in bed;with call bell/phone within reach;with bed alarm set;with family/visitor present;with SCD's reapplied (cervical collar donned; pillows under BLE to float heels) Nurse Communication: Mobility status;Precautions;Weight bearing status (skin integrity at thoracic spine) PT Visit Diagnosis: Unsteadiness on feet (R26.81);Repeated falls (R29.6);Muscle weakness (generalized) (M62.81);Pain Pain - Right/Left: Right Pain - part of body: Hip     Time: 1308-6578 PT Time Calculation (min) (ACUTE ONLY): 14 min  Charges:    $Therapeutic Activity: 8-22 mins PT General Charges $$ ACUTE PT VISIT: 1 Visit                    Danielle Dess, PTA 03/20/23, 2:44 PM

## 2023-03-20 NOTE — IPAL (Signed)
  Interdisciplinary Goals of Care Family Meeting   Date carried out: 03/20/2023  Location of the meeting: Phone conference  Member's involved: Physician and Family Member or next of kin  Durable Power of Attorney or acting medical decision maker: Eric Hicks    Discussion: We discussed goals of care for Eric Hicks he's been hallucinating and likely having terminal agitation. Family is on board with keeping him comfortable. Palliative care will meet with the family once they arrive to finalize the comfort care plans.  Code status:   Code Status: Limited: Do not attempt resuscitation (DNR) -DNR-LIMITED -Do Not Intubate/DNI    Disposition: In-patient comfort care  Time spent for the meeting: 35 mins    Delfino Lovett, MD  03/20/2023, 3:13 PM

## 2023-03-20 NOTE — TOC Progression Note (Signed)
Transition of Care [facility]) - Progression Note    Patient Details  Name: Eric Hicks MRN: [id number] Date of Birth: 05/21/1926  Transition of Care [facility]) CM/SW Contact  Liliana Cline, LCSW Phone Number: 03/20/2023, 10:53 AM  Clinical Narrative:    Call to son to discuss SNF, he requests I call daughter [name].  Called Debra. Patient is from home alone, son lives next door. PCP is Dr. Darrick Huntsman. Pharmacy is [facility] [place]. Patient's children are agreeable to SNF - [name] states her preferences are (in order)- [place], [place] [address], [address], [place], [place], and [facility]. [name] states her family knows someone at [facility] and is going to call them. SNF work up is being started. [name] had questoins regarding patient's neck brace which CSW relayed to the MD.        Expected Discharge Plan and Services                                               Social Determinants of Health (SDOH) Interventions SDOH Screenings   Food Insecurity: No Food Insecurity (03/18/2023)  Housing: Low Risk  (03/18/2023)  Transportation Needs: No Transportation Needs (03/18/2023)  Utilities: Not At Risk (03/18/2023)  Alcohol Screen: Low Risk  (10/11/2022)  Depression (PHQ2-9): Low Risk  (12/22/2022)  Recent Concern: Depression (PHQ2-9) - Medium Risk (11/17/2022)  Financial Resource Strain: Low Risk  (10/11/2022)  Physical Activity: Insufficiently Active (10/11/2022)  Social Connections: Socially Integrated (10/11/2022)  Recent Concern: Social Connections - Moderately Isolated (08/15/2022)  Stress: No Stress Concern Present (10/11/2022)  Tobacco Use: Medium Risk (03/18/2023)    Readmission Risk Interventions    11/20/2022    1:37 PM  Readmission Risk Prevention Plan  Transportation Screening Complete  PCP or Specialist Appt within 3-5 Days Complete  HRI or Home Care Consult Complete  Social Work Consult for Recovery Care Planning/Counseling Complete  Palliative Care Screening  Not Applicable  Medication Review Oceanographer) Complete

## 2023-03-21 ENCOUNTER — Ambulatory Visit: Payer: Medicare Other

## 2023-03-23 LAB — CULTURE, BLOOD (ROUTINE X 2)
Culture: NO GROWTH
Culture: NO GROWTH
Special Requests: ADEQUATE
Special Requests: ADEQUATE

## 2023-03-27 NOTE — Death Summary Note (Signed)
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  Hospital Diagnoses: Principal Problem:   Fall at home, initial encounter Active Problems:   History of pulmonary embolism   Closed right hip fracture (HCC)   CKD (chronic kidney disease) stage 4, GFR 15-29 ml/min (HCC)   COPD (chronic obstructive pulmonary disease) with emphysema (HCC)   Chronic diastolic CHF (congestive heart failure) (HCC)   HTN (hypertension)   Paroxysmal atrial fibrillation (HCC)   Lung cancer, upper lobe (HCC)   Goals of care, counseling/discussion   Controlled type 2 diabetes mellitus with stage 3 chronic kidney disease, without long-term current use of insulin (HCC)   Recurrent falls   Protein-calorie malnutrition, severe   Closed anterior displaced type II dens fracture (HCC)   Closed nondisplaced fracture of medial wall of right acetabulum [facility])   Hospital Course: Assessment and Plan:  Acute type II odontoid fracture as well as a C1 fracture. S/p fall - Neurosurgery seen - conservative mgmt planned per d/w family by neurosurgery   Minimally displaced medial wall fracture right acetabulum with minimal protrusion Ortho c/s - Dr Robbie Lis seen - conservative mgmt   CKD 3b COPD (chronic obstructive pulmonary disease) with emphysema (HCC) Chronic diastolic CHF (congestive heart failure) (HCC)  Left ventricular ejection fraction, by estimation, is 55 to 60% .  HTN (hypertension) Paroxysmal atrial fibrillation (HCC) Recurrent falls Controlled type 2 diabetes mellitus with stage 3 chronic kidney disease, without long-term current use of insulin (HCC) H/o Lung cancer, upper lobe (HCC) Terminal Agitation, Hallucination  He  was comfort care and passed peacefully.        Consultations: Ortho, Neurosurgery, Palliative care  The results of significant diagnostics from this hospitalization (including imaging, microbiology, ancillary and laboratory) are listed below for reference.   Significant Diagnostic Studies: US Venous Img Lower Bilateral (DVT)  Result Date: 03/18/2023 CLINICAL DATA:  Leg edema EXAM: BILATERAL LOWER EXTREMITY VENOUS DOPPLER ULTRASOUND TECHNIQUE: Gray-scale sonography with graded compression, as well as color Doppler and duplex ultrasound were performed to evaluate the lower extremity deep venous systems from the level of the common femoral vein and including the common femoral, femoral, profunda femoral, popliteal and calf veins including the posterior tibial, peroneal and gastrocnemius veins when visible. The superficial great saphenous vein was also interrogated. Spectral Doppler was utilized to evaluate flow at rest and with distal augmentation maneuvers in the common femoral, femoral and popliteal veins. COMPARISON:  None Available. FINDINGS: RIGHT LOWER EXTREMITY Common Femoral Vein: No evidence of thrombus. Normal compressibility, respiratory phasicity and response to augmentation. Saphenofemoral Junction: No evidence of thrombus. Normal compressibility and flow on color Doppler imaging. Profunda Femoral Vein: No evidence of thrombus. Normal compressibility and flow on color Doppler imaging. Femoral Vein: No evidence of thrombus. Normal compressibility, respiratory phasicity and response to augmentation. Popliteal Vein: No evidence of thrombus. Normal compressibility, respiratory phasicity and response to augmentation. Calf Veins: No evidence of thrombus. Normal compressibility and flow on color Doppler imaging. Superficial Great Saphenous Vein: No evidence of thrombus. Normal compressibility. Venous Reflux:  None. Other Findings:  None. LEFT LOWER EXTREMITY Common Femoral Vein: No evidence of  thrombus. Normal compressibility, respiratory phasicity and response to augmentation. Saphenofemoral Junction: No  evidence of thrombus. Normal compressibility and flow on color Doppler imaging. Profunda Femoral Vein: No evidence of thrombus. Normal compressibility and flow on color Doppler imaging. Femoral Vein: No evidence of thrombus. Normal compressibility, respiratory phasicity and response to augmentation. Popliteal Vein: No evidence of thrombus. Normal compressibility, respiratory phasicity and response to augmentation. Calf Veins: Nonocclusive thrombus seen in the posterior tibial veins of the left calf. Superficial Great Saphenous Vein: No evidence of thrombus. Normal compressibility. Venous Reflux:  None. Other Findings:  None. IMPRESSION: No evidence of right lower extremity DVT. Nonocclusive clot within the posterior tibial veins of the left calf. Electronically Signed   By: Charlett Nose M.D.   On: 03/18/2023 02:18   CT CHEST WO CONTRAST  Result Date: 03/18/2023 CLINICAL DATA:  Respiratory failure EXAM: CT CHEST WITHOUT CONTRAST TECHNIQUE: Multidetector CT imaging of the chest was performed following the standard protocol without IV contrast. RADIATION DOSE REDUCTION: This exam was performed according to the departmental dose-optimization program which includes automated exposure control, adjustment of the mA and/or kV according to patient size and/or use of iterative reconstruction technique. COMPARISON:  01/21/2023 FINDINGS: Cardiovascular: Cardiomegaly. Diffuse coronary artery and moderate aortic atherosclerosis. Aneurysmal dilatation of the ascending thoracic aorta measuring 4.6 cm, stable Mediastinum/Nodes: No mediastinal, hilar, or axillary adenopathy. Layering secretions within the trachea. Thyroid and esophagus unremarkable. Lungs/Pleura: Moderate right pleural effusion and small left pleural effusion. Masslike area in the right upper lobe/apex measures up to 4.3 cm and is similar to prior  study. Ground-glass airspace disease in the left upper lobe, new since prior study. Compressive atelectasis in the lower lobes. Nodular areas previously described in the lower lobes and right middle lobe are difficult to visualize due to surrounding airspace disease currently. Upper Abdomen: No acute findings Musculoskeletal: Chest wall soft tissues are unremarkable. No acute bony abnormality. IMPRESSION: Stable right apical masslike density. Moderate right pleural effusion and small left pleural effusion. Compressive atelectasis in the lower lobes. Ground-glass airspace disease in the posterior left upper lobe concerning for pneumonia. Cardiomegaly, diffuse coronary artery disease. 4.6 cm ascending thoracic aortic aneurysm, stable since prior study. Aortic Atherosclerosis (ICD10-I70.0). Electronically Signed   By: Charlett Nose M.D.   On: 03/18/2023 00:27   MR Cervical Spine Wo Contrast  Result Date: 03/02/2023 CLINICAL DATA:  Initial evaluation for acute trauma. EXAM: MRI CERVICAL SPINE WITHOUT CONTRAST TECHNIQUE: Multiplanar, multisequence MR imaging of the cervical spine was performed. No intravenous contrast was administered. COMPARISON:  Prior CT from earlier the same day. FINDINGS: Alignment: Straightening of the normal cervical lordosis. Trace retrolisthesis of C3 on C4, with trace anterolisthesis of C4 on C5, C7 on T1, and T1 on T2. Findings likely chronic and degenerative. Vertebrae: Previously identified oblique type 2 odontoid fracture with minimal distraction again noted, stable from prior CT. Concomitant C1 fractures noted as well, also grossly stable, better seen on prior exam. Associated reactive marrow edema, consistent with acute fractures. No significant displacement or malalignment. No other acute or recent fracture. Mild chronic height loss present at the superior endplates of T3 and T4. Underlying bone marrow signal intensity within normal limits. No worrisome osseous lesions. Cord: Normal  signal and morphology. No evidence for traumatic cord injury. Posterior Fossa, vertebral arteries, paraspinal tissues: Age-related atrophy noted within the visualized brain. Craniocervical junction within normal limits. Mild edema within the upper prevertebral soft tissues related to the adjacent odontoid fracture. The major ligamentous structures appear intact. Normal flow voids seen within the vertebral arteries bilaterally. Disc levels: C1-2: Degenerative  osteoarthritic changes about the atlantodental articulation with underlying C1 and C2 fractures. No stenosis. C2-C3: Minimal disc bulge. Moderate left facet hypertrophy. No significant stenosis. C3-C4: Mild disc bulge with uncovertebral spurring. Mild bilateral facet hypertrophy. No spinal stenosis. Mild to moderate left C4 foraminal narrowing. Right neural foramen remains patent. C4-C5: Mild disc bulge with left-sided uncovertebral spurring. Mild left-sided facet hypertrophy. No spinal stenosis. Mild left C5 foraminal narrowing. Right neural foramen remains patent. C5-C6: Degenerate intervertebral disc space narrowing with circumferential disc osteophyte complex. No significant spinal stenosis. Moderate right C6 foraminal narrowing. Left neural foramen remains patent. C6-C7: Degenerate intervertebral disc space narrowing with circumferential disc osteophyte complex. No spinal stenosis. Moderate right C7 foraminal narrowing. Left neural foramen remains patent. C7-T1: Anterolisthesis without significant disc bulge. Moderate right facet hypertrophy. No spinal stenosis. Foramina remain IMPRESSION: 1. Acute type 2 odontoid fracture with minimal distraction, with concomitant C1 fractures. Findings are stable and better visualized on prior CT. 2. No other acute traumatic injury within the cervical spine. No evidence for traumatic cord injury. Major ligamentous structures intact. 3. Underlying multilevel cervical spondylosis without significant spinal stenosis. Mild to  moderate left C4 and C5 foraminal narrowing, with moderate right C6 and C7 foraminal stenosis. Electronically Signed   By: Rise Mu M.D.   On: 03/03/2023 23:51   DG Hip Unilat With Pelvis 2-3 Views Right  Result Date: 03/12/2023 CLINICAL DATA:  Fall, right hip pain EXAM: DG HIP (WITH OR WITHOUT PELVIS) 2-3V RIGHT COMPARISON:  03/24/2022 FINDINGS: Interval right hip replacement. There is a fracture through the medial wall of the acetabulum with mild protrusio. No subluxation or dislocation. IMPRESSION: Right hip replacement. Fracture through the medial wall of the right acetabulum. Electronically Signed   By: Charlett Nose M.D.   On: 03/25/2023 20:31   CT Cervical Spine Wo Contrast  Result Date: 03/15/2023 CLINICAL DATA:  Larey Seat, head trauma EXAM: CT CERVICAL SPINE WITHOUT CONTRAST TECHNIQUE: Multidetector CT imaging of the cervical spine was performed without intravenous contrast. Multiplanar CT image reconstructions were also generated. RADIATION DOSE REDUCTION: This exam was performed according to the departmental dose-optimization program which includes automated exposure control, adjustment of the mA and/or kV according to patient size and/or use of iterative reconstruction technique. COMPARISON:  03/23/2022 FINDINGS: Alignment: Stable mild right convex scoliosis. Skull base and vertebrae: There is a minimally distracted type 2 odontoid fracture, with 2 mm of separation at the fracture site. Otherwise alignment is anatomic. There is also a nondisplaced fracture through the right lamina at C1, with fracture extending through the right C1 facet. Mild comminution at the fracture site. Alignment is anatomic. No other acute displaced cervical spine fractures. Soft tissues and spinal canal: No evidence of canal hematoma. No prevertebral soft tissue swelling. Disc levels: There are severe degenerative changes at the C1-C2 interface, right much greater than left. There is diffuse facet hypertrophy and  spondylosis, most pronounced at the C5-6 and C6-7 levels. Upper chest: Airway is patent. Continue masslike consolidation within the right upper lobe, incompletely evaluated on this study. Other: Reconstructed images confirm the above findings. IMPRESSION: 1. Type 2 odontoid fracture, with minimal distraction of the fracture site. Otherwise near anatomic alignment. 2. Comminuted right C1 facet fracture and transverse right C1 lamina fracture, with near anatomic alignment. 3. Extensive multilevel cervical degenerative changes as above. 4. Continued masslike consolidation within the right upper lobe, incompletely evaluated on this study. Critical Value/emergent results were called by telephone at the time of interpretation on 02/26/2023 at 8:30 pm  to provider Donna Bernard , who verbally acknowledged these results. Electronically Signed   By: Sharlet Salina M.D.   On: 03/15/2023 20:30   CT Head Wo Contrast  Result Date: 03/20/2023 CLINICAL DATA:  Larey Seat today, head trauma EXAM: CT HEAD WITHOUT CONTRAST TECHNIQUE: Contiguous axial images were obtained from the base of the skull through the vertex without intravenous contrast. RADIATION DOSE REDUCTION: This exam was performed according to the departmental dose-optimization program which includes automated exposure control, adjustment of the mA and/or kV according to patient size and/or use of iterative reconstruction technique. COMPARISON:  03/23/2022 FINDINGS: Brain: Diffuse cerebral atrophy, likely age appropriate. No acute infarct or hemorrhage. Lateral ventricles and midline structures are unremarkable. No acute extra-axial fluid collections. No mass effect. Vascular: No hyperdense vessel or unexpected calcification. Stable atherosclerosis. Skull: Scalp hematoma laceration along the midline parietal convexity. No underlying fracture. The remainder of the calvarium is unremarkable. Sinuses/Orbits: No acute finding. Other: None. IMPRESSION: 1. Scalp hematoma  laceration along the midline parietal convexity. No underlying fracture. 2. No acute intracranial process. Electronically Signed   By: Sharlet Salina M.D.   On: 03/05/2023 20:23    Microbiology: Recent Results (from the past 240 hour(s))  Culture, blood (Routine X 2) w Reflex to ID Panel     Status: None (Preliminary result)   Collection Time: 03/18/23  1:20 AM   Specimen: BLOOD  Result Value Ref Range Status   Specimen Description BLOOD LEFT ANTECUBITAL  Final   Special Requests   Final    BOTTLES DRAWN AEROBIC AND ANAEROBIC Blood Culture adequate volume   Culture   Final    NO GROWTH 2 DAYS Performed at [facility], [address] [place], [place] [address]    Report Status PENDING  Incomplete  Culture, blood (Routine X 2) w Reflex to ID Panel     Status: None (Preliminary result)   Collection Time: 03/18/23  1:48 AM   Specimen: BLOOD  Result Value Ref Range Status   Specimen Description BLOOD BLOOD RIGHT WRIST  Final   Special Requests   Final    BOTTLES DRAWN AEROBIC AND ANAEROBIC Blood Culture adequate volume   Culture   Final    NO GROWTH 2 DAYS Performed at [facility], [address] [place], [place] [address]    Report Status PENDING  Incomplete    Time spent: 35 minutes  Signed: Delfino Lovett, MD 03/09/2023

## 2023-03-27 NOTE — Progress Notes (Signed)
    OVERNIGHT PROGRESS REPORT  Notified by RN that patient is deceased as of 0125 2024/04/12  Patient was comfort care  2 RN verified.  Family was at bedside    Paisleigh Maroney Lezlie Lye, MSN, APRN, AGACNP-BC [facility] [place] Pager: [phone]. Check Amion for Availability

## 2023-03-27 DEATH — deceased

## 2023-05-05 ENCOUNTER — Other Ambulatory Visit: Payer: Medicare Other

## 2023-05-16 ENCOUNTER — Ambulatory Visit: Payer: Medicare Other | Admitting: Oncology

## 2023-07-20 ENCOUNTER — Other Ambulatory Visit: Payer: Medicare Other

## 2023-07-25 ENCOUNTER — Ambulatory Visit: Payer: Medicare Other | Admitting: Radiation Oncology
